# Patient Record
Sex: Female | Born: 1940 | Race: White | Hispanic: No | Smoking: Former smoker
Health system: Southern US, Community
[De-identification: ages and names within clinical notes are randomized; demographics above are authoritative.]

## PROBLEM LIST (undated history)

## (undated) DIAGNOSIS — F32A Depression, unspecified: Secondary | ICD-10-CM

## (undated) DIAGNOSIS — F419 Anxiety disorder, unspecified: Secondary | ICD-10-CM

## (undated) DIAGNOSIS — R112 Nausea with vomiting, unspecified: Secondary | ICD-10-CM

## (undated) DIAGNOSIS — F329 Major depressive disorder, single episode, unspecified: Secondary | ICD-10-CM

## (undated) DIAGNOSIS — K219 Gastro-esophageal reflux disease without esophagitis: Secondary | ICD-10-CM

## (undated) DIAGNOSIS — N189 Chronic kidney disease, unspecified: Secondary | ICD-10-CM

## (undated) DIAGNOSIS — E079 Disorder of thyroid, unspecified: Secondary | ICD-10-CM

## (undated) DIAGNOSIS — I10 Essential (primary) hypertension: Secondary | ICD-10-CM

## (undated) DIAGNOSIS — IMO0001 Reserved for inherently not codable concepts without codable children: Secondary | ICD-10-CM

## (undated) DIAGNOSIS — R252 Cramp and spasm: Secondary | ICD-10-CM

## (undated) DIAGNOSIS — S8010XA Contusion of unspecified lower leg, initial encounter: Secondary | ICD-10-CM

## (undated) DIAGNOSIS — D649 Anemia, unspecified: Secondary | ICD-10-CM

## (undated) DIAGNOSIS — M542 Cervicalgia: Secondary | ICD-10-CM

## (undated) DIAGNOSIS — Z9889 Other specified postprocedural states: Secondary | ICD-10-CM

## (undated) DIAGNOSIS — I82409 Acute embolism and thrombosis of unspecified deep veins of unspecified lower extremity: Secondary | ICD-10-CM

## (undated) DIAGNOSIS — R51 Headache: Secondary | ICD-10-CM

## (undated) DIAGNOSIS — R600 Localized edema: Secondary | ICD-10-CM

## (undated) DIAGNOSIS — M1990 Unspecified osteoarthritis, unspecified site: Secondary | ICD-10-CM

## (undated) HISTORY — DX: Unspecified osteoarthritis, unspecified site: M19.90

## (undated) HISTORY — PX: OTHER SURGICAL HISTORY: SHX169

## (undated) HISTORY — PX: VESICOVAGINAL FISTULA CLOSURE W/ TAH: SUR271

## (undated) HISTORY — DX: Cervicalgia: M54.2

## (undated) HISTORY — DX: Gastro-esophageal reflux disease without esophagitis: K21.9

## (undated) HISTORY — DX: Essential (primary) hypertension: I10

## (undated) HISTORY — PX: TONSILLECTOMY: SUR1361

## (undated) HISTORY — PX: FOOT NEUROMA SURGERY: SHX646

## (undated) HISTORY — PX: TONSILLECTOMY AND ADENOIDECTOMY: SHX28

## (undated) HISTORY — PX: NASAL SINUS SURGERY: SHX719

## (undated) HISTORY — PX: ABDOMINAL HYSTERECTOMY: SHX81

## (undated) HISTORY — DX: Reserved for inherently not codable concepts without codable children: IMO0001

## (undated) HISTORY — DX: Contusion of unspecified lower leg, initial encounter: S80.10XA

## (undated) HISTORY — DX: Acute embolism and thrombosis of unspecified deep veins of unspecified lower extremity: I82.409

## (undated) HISTORY — DX: Localized edema: R60.0

## (undated) HISTORY — DX: Disorder of thyroid, unspecified: E07.9

## (undated) HISTORY — DX: Anxiety disorder, unspecified: F41.9

## (undated) HISTORY — PX: APPENDECTOMY: SHX54

## (undated) HISTORY — DX: Depression, unspecified: F32.A

## (undated) HISTORY — PX: CERVICAL SPINE SURGERY: SHX589

## (undated) HISTORY — DX: Major depressive disorder, single episode, unspecified: F32.9

---

## 1999-04-13 ENCOUNTER — Encounter: Payer: Self-pay | Admitting: Neurosurgery

## 1999-04-15 ENCOUNTER — Encounter: Payer: Self-pay | Admitting: Neurosurgery

## 1999-04-15 ENCOUNTER — Inpatient Hospital Stay (HOSPITAL_COMMUNITY): Admission: RE | Admit: 1999-04-15 | Discharge: 1999-04-17 | Payer: Self-pay | Admitting: Neurosurgery

## 1999-06-08 ENCOUNTER — Encounter: Admission: RE | Admit: 1999-06-08 | Discharge: 1999-06-08 | Payer: Self-pay | Admitting: Neurosurgery

## 1999-06-08 ENCOUNTER — Encounter: Payer: Self-pay | Admitting: Neurosurgery

## 2001-03-22 ENCOUNTER — Encounter: Payer: Self-pay | Admitting: Family Medicine

## 2001-03-22 ENCOUNTER — Ambulatory Visit (HOSPITAL_COMMUNITY): Admission: RE | Admit: 2001-03-22 | Discharge: 2001-03-22 | Payer: Self-pay | Admitting: Family Medicine

## 2001-03-25 ENCOUNTER — Encounter: Payer: Self-pay | Admitting: Family Medicine

## 2001-03-25 ENCOUNTER — Ambulatory Visit (HOSPITAL_COMMUNITY): Admission: RE | Admit: 2001-03-25 | Discharge: 2001-03-25 | Payer: Self-pay | Admitting: *Deleted

## 2001-05-09 ENCOUNTER — Ambulatory Visit (HOSPITAL_COMMUNITY): Admission: RE | Admit: 2001-05-09 | Discharge: 2001-05-09 | Payer: Self-pay | Admitting: Family Medicine

## 2001-05-09 ENCOUNTER — Encounter: Payer: Self-pay | Admitting: Family Medicine

## 2001-06-12 ENCOUNTER — Other Ambulatory Visit: Admission: RE | Admit: 2001-06-12 | Discharge: 2001-06-12 | Payer: Self-pay | Admitting: Family Medicine

## 2001-07-25 ENCOUNTER — Encounter: Payer: Self-pay | Admitting: Family Medicine

## 2001-07-25 ENCOUNTER — Ambulatory Visit (HOSPITAL_COMMUNITY): Admission: RE | Admit: 2001-07-25 | Discharge: 2001-07-25 | Payer: Self-pay | Admitting: Family Medicine

## 2002-04-29 ENCOUNTER — Ambulatory Visit (HOSPITAL_COMMUNITY): Admission: RE | Admit: 2002-04-29 | Discharge: 2002-04-29 | Payer: Self-pay | Admitting: Family Medicine

## 2002-04-29 ENCOUNTER — Encounter: Payer: Self-pay | Admitting: Family Medicine

## 2002-08-06 ENCOUNTER — Encounter: Payer: Self-pay | Admitting: Family Medicine

## 2002-08-06 ENCOUNTER — Ambulatory Visit (HOSPITAL_COMMUNITY): Admission: RE | Admit: 2002-08-06 | Discharge: 2002-08-06 | Payer: Self-pay | Admitting: Family Medicine

## 2002-08-19 ENCOUNTER — Emergency Department (HOSPITAL_COMMUNITY): Admission: EM | Admit: 2002-08-19 | Discharge: 2002-08-19 | Payer: Self-pay | Admitting: Emergency Medicine

## 2002-08-19 ENCOUNTER — Encounter: Payer: Self-pay | Admitting: Emergency Medicine

## 2002-09-30 ENCOUNTER — Emergency Department (HOSPITAL_COMMUNITY): Admission: EM | Admit: 2002-09-30 | Discharge: 2002-09-30 | Payer: Self-pay | Admitting: Emergency Medicine

## 2003-01-18 ENCOUNTER — Emergency Department (HOSPITAL_COMMUNITY): Admission: EM | Admit: 2003-01-18 | Discharge: 2003-01-18 | Payer: Self-pay | Admitting: Emergency Medicine

## 2003-05-27 ENCOUNTER — Ambulatory Visit (HOSPITAL_COMMUNITY): Admission: RE | Admit: 2003-05-27 | Discharge: 2003-05-27 | Payer: Self-pay | Admitting: Family Medicine

## 2003-05-27 ENCOUNTER — Encounter: Payer: Self-pay | Admitting: Family Medicine

## 2003-06-01 ENCOUNTER — Encounter (HOSPITAL_COMMUNITY): Admission: RE | Admit: 2003-06-01 | Discharge: 2003-07-01 | Payer: Self-pay | Admitting: Oncology

## 2003-06-01 ENCOUNTER — Encounter: Admission: RE | Admit: 2003-06-01 | Discharge: 2003-06-01 | Payer: Self-pay | Admitting: Oncology

## 2003-06-16 ENCOUNTER — Ambulatory Visit (HOSPITAL_COMMUNITY): Admission: RE | Admit: 2003-06-16 | Discharge: 2003-06-16 | Payer: Self-pay | Admitting: Internal Medicine

## 2003-06-16 LAB — HM COLONOSCOPY: HM Colonoscopy: NORMAL

## 2003-11-16 ENCOUNTER — Ambulatory Visit (HOSPITAL_COMMUNITY): Admission: RE | Admit: 2003-11-16 | Discharge: 2003-11-16 | Payer: Self-pay | Admitting: Family Medicine

## 2004-02-15 ENCOUNTER — Inpatient Hospital Stay (HOSPITAL_COMMUNITY): Admission: RE | Admit: 2004-02-15 | Discharge: 2004-02-20 | Payer: Self-pay | Admitting: Family Medicine

## 2004-04-22 ENCOUNTER — Ambulatory Visit (HOSPITAL_COMMUNITY): Admission: RE | Admit: 2004-04-22 | Discharge: 2004-04-22 | Payer: Self-pay | Admitting: Family Medicine

## 2004-06-07 ENCOUNTER — Ambulatory Visit (HOSPITAL_COMMUNITY): Admission: RE | Admit: 2004-06-07 | Discharge: 2004-06-07 | Payer: Self-pay | Admitting: Family Medicine

## 2004-06-14 ENCOUNTER — Ambulatory Visit (HOSPITAL_COMMUNITY): Admission: RE | Admit: 2004-06-14 | Discharge: 2004-06-14 | Payer: Self-pay | Admitting: Family Medicine

## 2004-08-15 ENCOUNTER — Ambulatory Visit: Payer: Self-pay | Admitting: Family Medicine

## 2004-08-16 ENCOUNTER — Ambulatory Visit (HOSPITAL_COMMUNITY): Admission: RE | Admit: 2004-08-16 | Discharge: 2004-08-16 | Payer: Self-pay | Admitting: Family Medicine

## 2004-08-17 ENCOUNTER — Ambulatory Visit (HOSPITAL_COMMUNITY): Admission: RE | Admit: 2004-08-17 | Discharge: 2004-08-17 | Payer: Self-pay | Admitting: Family Medicine

## 2004-09-15 ENCOUNTER — Ambulatory Visit: Payer: Self-pay | Admitting: Family Medicine

## 2004-09-16 ENCOUNTER — Encounter (INDEPENDENT_AMBULATORY_CARE_PROVIDER_SITE_OTHER): Payer: Self-pay | Admitting: *Deleted

## 2004-11-07 ENCOUNTER — Ambulatory Visit: Payer: Self-pay | Admitting: Family Medicine

## 2004-12-06 ENCOUNTER — Ambulatory Visit: Payer: Self-pay | Admitting: Family Medicine

## 2005-03-07 ENCOUNTER — Ambulatory Visit: Payer: Self-pay | Admitting: Family Medicine

## 2005-04-18 ENCOUNTER — Ambulatory Visit: Payer: Self-pay | Admitting: Family Medicine

## 2005-06-26 ENCOUNTER — Ambulatory Visit: Payer: Self-pay | Admitting: Family Medicine

## 2005-06-27 ENCOUNTER — Inpatient Hospital Stay: Payer: Self-pay | Admitting: Unknown Physician Specialty

## 2005-09-12 ENCOUNTER — Ambulatory Visit: Payer: Self-pay | Admitting: Family Medicine

## 2005-09-13 ENCOUNTER — Ambulatory Visit (HOSPITAL_COMMUNITY): Admission: RE | Admit: 2005-09-13 | Discharge: 2005-09-13 | Payer: Self-pay | Admitting: Family Medicine

## 2005-09-21 ENCOUNTER — Ambulatory Visit: Payer: Self-pay | Admitting: Family Medicine

## 2005-11-01 ENCOUNTER — Ambulatory Visit: Payer: Self-pay | Admitting: Family Medicine

## 2005-11-09 ENCOUNTER — Ambulatory Visit (HOSPITAL_COMMUNITY): Admission: RE | Admit: 2005-11-09 | Discharge: 2005-11-09 | Payer: Self-pay | Admitting: Family Medicine

## 2005-12-04 ENCOUNTER — Ambulatory Visit (HOSPITAL_COMMUNITY): Admission: RE | Admit: 2005-12-04 | Discharge: 2005-12-04 | Payer: Self-pay | Admitting: Family Medicine

## 2006-01-18 ENCOUNTER — Ambulatory Visit: Payer: Self-pay | Admitting: Family Medicine

## 2006-02-01 ENCOUNTER — Ambulatory Visit: Payer: Self-pay | Admitting: Family Medicine

## 2006-02-13 ENCOUNTER — Ambulatory Visit (HOSPITAL_COMMUNITY): Admission: RE | Admit: 2006-02-13 | Discharge: 2006-02-13 | Payer: Self-pay | Admitting: Family Medicine

## 2006-03-01 ENCOUNTER — Ambulatory Visit: Payer: Self-pay | Admitting: Family Medicine

## 2006-03-26 ENCOUNTER — Ambulatory Visit: Payer: Self-pay | Admitting: Family Medicine

## 2006-05-21 ENCOUNTER — Ambulatory Visit: Payer: Self-pay | Admitting: Family Medicine

## 2006-09-18 ENCOUNTER — Ambulatory Visit: Payer: Self-pay | Admitting: Family Medicine

## 2006-09-24 ENCOUNTER — Ambulatory Visit (HOSPITAL_COMMUNITY): Admission: RE | Admit: 2006-09-24 | Discharge: 2006-09-24 | Payer: Self-pay | Admitting: Family Medicine

## 2006-09-24 ENCOUNTER — Encounter: Payer: Self-pay | Admitting: Family Medicine

## 2006-09-24 LAB — CONVERTED CEMR LAB
ALT: 25 units/L (ref 0–35)
Albumin: 4.2 g/dL (ref 3.5–5.2)
BUN: 9 mg/dL (ref 6–23)
Chloride: 104 meq/L (ref 96–112)
Cholesterol: 181 mg/dL (ref 0–200)
Glucose, Bld: 115 mg/dL — ABNORMAL HIGH (ref 70–99)
LDL Cholesterol: 84 mg/dL (ref 0–99)
Potassium: 4.4 meq/L (ref 3.5–5.3)
Sodium: 140 meq/L (ref 135–145)
TSH: 6.805 microintl units/mL — ABNORMAL HIGH (ref 0.350–5.50)
Total CHOL/HDL Ratio: 5.7
Total Protein: 7.4 g/dL (ref 6.0–8.3)
Triglycerides: 325 mg/dL — ABNORMAL HIGH (ref <150)
Uric Acid, Serum: 4.8 mg/dL (ref 2.4–7.0)
VLDL: 65 mg/dL — ABNORMAL HIGH (ref 0–40)

## 2006-10-03 ENCOUNTER — Ambulatory Visit: Payer: Self-pay | Admitting: Family Medicine

## 2006-10-08 ENCOUNTER — Encounter: Payer: Self-pay | Admitting: Family Medicine

## 2006-10-08 LAB — CONVERTED CEMR LAB: Glucose, Fasting: 98 mg/dL (ref 70–99)

## 2006-10-22 ENCOUNTER — Ambulatory Visit: Payer: Self-pay | Admitting: Family Medicine

## 2006-12-03 ENCOUNTER — Ambulatory Visit: Payer: Self-pay | Admitting: Family Medicine

## 2006-12-17 ENCOUNTER — Ambulatory Visit (HOSPITAL_COMMUNITY): Admission: RE | Admit: 2006-12-17 | Discharge: 2006-12-17 | Payer: Self-pay | Admitting: Family Medicine

## 2006-12-17 ENCOUNTER — Ambulatory Visit: Payer: Self-pay | Admitting: Family Medicine

## 2006-12-17 LAB — CONVERTED CEMR LAB
BUN: 9 mg/dL (ref 6–23)
Calcium: 8.8 mg/dL (ref 8.4–10.5)
Creatinine, Ser: 0.97 mg/dL (ref 0.40–1.20)
Glucose, Bld: 97 mg/dL (ref 70–99)

## 2006-12-19 ENCOUNTER — Ambulatory Visit (HOSPITAL_COMMUNITY): Admission: RE | Admit: 2006-12-19 | Discharge: 2006-12-19 | Payer: Self-pay | Admitting: Family Medicine

## 2006-12-19 ENCOUNTER — Ambulatory Visit: Payer: Self-pay | Admitting: Cardiology

## 2006-12-21 ENCOUNTER — Ambulatory Visit: Payer: Self-pay | Admitting: Family Medicine

## 2006-12-21 ENCOUNTER — Inpatient Hospital Stay (HOSPITAL_COMMUNITY): Admission: AD | Admit: 2006-12-21 | Discharge: 2006-12-25 | Payer: Self-pay

## 2007-01-10 ENCOUNTER — Ambulatory Visit: Payer: Self-pay | Admitting: Family Medicine

## 2007-01-14 ENCOUNTER — Ambulatory Visit (HOSPITAL_COMMUNITY): Admission: RE | Admit: 2007-01-14 | Discharge: 2007-01-14 | Payer: Self-pay | Admitting: Family Medicine

## 2007-02-14 ENCOUNTER — Ambulatory Visit: Payer: Self-pay | Admitting: Family Medicine

## 2007-02-20 ENCOUNTER — Encounter: Payer: Self-pay | Admitting: Family Medicine

## 2007-02-20 LAB — CONVERTED CEMR LAB
ALT: 23 units/L (ref 0–35)
Bilirubin, Direct: 0.1 mg/dL (ref 0.0–0.3)
Chloride: 98 meq/L (ref 96–112)
Cholesterol: 215 mg/dL — ABNORMAL HIGH (ref 0–200)
Eosinophils Absolute: 0.4 10*3/uL (ref 0.0–0.7)
Glucose, Bld: 72 mg/dL (ref 70–99)
LDL Cholesterol: 140 mg/dL — ABNORMAL HIGH (ref 0–99)
Lymphocytes Relative: 37 % (ref 12–46)
Lymphs Abs: 3.2 10*3/uL (ref 0.7–3.3)
MCV: 100.7 fL — ABNORMAL HIGH (ref 78.0–100.0)
Neutro Abs: 4.1 10*3/uL (ref 1.7–7.7)
Neutrophils Relative %: 48 % (ref 43–77)
Platelets: 282 10*3/uL (ref 150–400)
Potassium: 4.6 meq/L (ref 3.5–5.3)
RBC: 4.29 M/uL (ref 3.87–5.11)
Sodium: 136 meq/L (ref 135–145)
TSH: 86.501 microintl units/mL — ABNORMAL HIGH (ref 0.350–5.50)
Total CHOL/HDL Ratio: 4.9
Total Protein: 7.9 g/dL (ref 6.0–8.3)
VLDL: 31 mg/dL (ref 0–40)
WBC: 8.5 10*3/uL (ref 4.0–10.5)

## 2007-03-06 ENCOUNTER — Ambulatory Visit: Payer: Self-pay | Admitting: Cardiovascular Disease

## 2007-03-14 ENCOUNTER — Ambulatory Visit (HOSPITAL_COMMUNITY): Admission: RE | Admit: 2007-03-14 | Discharge: 2007-03-14 | Payer: Self-pay | Admitting: Cardiovascular Disease

## 2007-04-15 ENCOUNTER — Ambulatory Visit: Payer: Self-pay | Admitting: Family Medicine

## 2007-04-15 LAB — CONVERTED CEMR LAB: TSH: 73.228 microintl units/mL — ABNORMAL HIGH (ref 0.350–5.50)

## 2007-05-06 ENCOUNTER — Ambulatory Visit: Payer: Self-pay | Admitting: Family Medicine

## 2007-05-10 ENCOUNTER — Ambulatory Visit: Payer: Self-pay | Admitting: Family Medicine

## 2007-05-10 LAB — CONVERTED CEMR LAB
Calcium: 9.3 mg/dL (ref 8.4–10.5)
Chloride: 100 meq/L (ref 96–112)
Creatinine, Ser: 0.98 mg/dL (ref 0.40–1.20)

## 2007-07-22 ENCOUNTER — Encounter: Payer: Self-pay | Admitting: Orthopedic Surgery

## 2007-07-22 ENCOUNTER — Emergency Department (HOSPITAL_COMMUNITY): Admission: EM | Admit: 2007-07-22 | Discharge: 2007-07-22 | Payer: Self-pay | Admitting: Emergency Medicine

## 2007-07-23 ENCOUNTER — Ambulatory Visit: Payer: Self-pay | Admitting: Orthopedic Surgery

## 2007-07-30 ENCOUNTER — Ambulatory Visit: Payer: Self-pay | Admitting: Family Medicine

## 2007-08-21 ENCOUNTER — Ambulatory Visit: Payer: Self-pay | Admitting: Family Medicine

## 2007-09-12 ENCOUNTER — Ambulatory Visit: Payer: Self-pay | Admitting: Family Medicine

## 2007-09-26 ENCOUNTER — Ambulatory Visit: Payer: Self-pay | Admitting: Family Medicine

## 2007-09-26 ENCOUNTER — Ambulatory Visit: Payer: Self-pay | Admitting: Orthopedic Surgery

## 2007-10-15 ENCOUNTER — Encounter (INDEPENDENT_AMBULATORY_CARE_PROVIDER_SITE_OTHER): Payer: Self-pay | Admitting: *Deleted

## 2007-10-15 DIAGNOSIS — I10 Essential (primary) hypertension: Secondary | ICD-10-CM | POA: Insufficient documentation

## 2007-10-15 DIAGNOSIS — E785 Hyperlipidemia, unspecified: Secondary | ICD-10-CM | POA: Insufficient documentation

## 2007-10-29 ENCOUNTER — Ambulatory Visit: Payer: Self-pay | Admitting: Family Medicine

## 2007-11-26 ENCOUNTER — Ambulatory Visit: Payer: Self-pay | Admitting: Family Medicine

## 2007-12-26 ENCOUNTER — Encounter: Payer: Self-pay | Admitting: Family Medicine

## 2007-12-26 LAB — CONVERTED CEMR LAB
ALT: 26 units/L (ref 0–35)
Alkaline Phosphatase: 77 units/L (ref 39–117)
Cholesterol: 166 mg/dL (ref 0–200)
Indirect Bilirubin: 0.3 mg/dL (ref 0.0–0.9)
Total Protein: 7.5 g/dL (ref 6.0–8.3)
Triglycerides: 411 mg/dL — ABNORMAL HIGH (ref <150)

## 2008-01-22 ENCOUNTER — Ambulatory Visit: Payer: Self-pay | Admitting: Family Medicine

## 2008-03-04 ENCOUNTER — Encounter: Payer: Self-pay | Admitting: Family Medicine

## 2008-03-12 ENCOUNTER — Encounter: Payer: Self-pay | Admitting: Family Medicine

## 2008-03-17 ENCOUNTER — Telehealth: Payer: Self-pay | Admitting: Family Medicine

## 2008-03-18 ENCOUNTER — Encounter: Payer: Self-pay | Admitting: Family Medicine

## 2008-03-25 ENCOUNTER — Encounter: Payer: Self-pay | Admitting: Family Medicine

## 2008-03-26 ENCOUNTER — Ambulatory Visit: Payer: Self-pay | Admitting: Family Medicine

## 2008-03-26 DIAGNOSIS — E119 Type 2 diabetes mellitus without complications: Secondary | ICD-10-CM | POA: Insufficient documentation

## 2008-03-27 ENCOUNTER — Encounter: Payer: Self-pay | Admitting: Family Medicine

## 2008-03-28 ENCOUNTER — Encounter: Payer: Self-pay | Admitting: Family Medicine

## 2008-03-29 DIAGNOSIS — G4700 Insomnia, unspecified: Secondary | ICD-10-CM | POA: Insufficient documentation

## 2008-03-29 DIAGNOSIS — F411 Generalized anxiety disorder: Secondary | ICD-10-CM | POA: Insufficient documentation

## 2008-03-29 DIAGNOSIS — E039 Hypothyroidism, unspecified: Secondary | ICD-10-CM | POA: Insufficient documentation

## 2008-03-31 ENCOUNTER — Encounter: Payer: Self-pay | Admitting: Family Medicine

## 2008-04-01 ENCOUNTER — Encounter (INDEPENDENT_AMBULATORY_CARE_PROVIDER_SITE_OTHER): Payer: Self-pay | Admitting: *Deleted

## 2008-04-01 ENCOUNTER — Telehealth: Payer: Self-pay | Admitting: Family Medicine

## 2008-04-01 ENCOUNTER — Encounter: Payer: Self-pay | Admitting: Family Medicine

## 2008-04-02 ENCOUNTER — Encounter: Payer: Self-pay | Admitting: Family Medicine

## 2008-04-15 ENCOUNTER — Telehealth: Payer: Self-pay | Admitting: Family Medicine

## 2008-04-16 ENCOUNTER — Encounter: Payer: Self-pay | Admitting: Family Medicine

## 2008-04-17 ENCOUNTER — Encounter: Payer: Self-pay | Admitting: Family Medicine

## 2008-05-07 ENCOUNTER — Telehealth: Payer: Self-pay | Admitting: Family Medicine

## 2008-05-14 ENCOUNTER — Telehealth: Payer: Self-pay | Admitting: Family Medicine

## 2008-05-15 ENCOUNTER — Encounter: Payer: Self-pay | Admitting: Family Medicine

## 2008-05-21 ENCOUNTER — Encounter: Payer: Self-pay | Admitting: Family Medicine

## 2008-05-27 ENCOUNTER — Ambulatory Visit: Payer: Self-pay | Admitting: Family Medicine

## 2008-05-27 LAB — CONVERTED CEMR LAB
Glucose, Bld: 148 mg/dL
Hgb A1c MFr Bld: 6.3 %

## 2008-06-02 ENCOUNTER — Telehealth: Payer: Self-pay | Admitting: Family Medicine

## 2008-06-05 ENCOUNTER — Encounter: Payer: Self-pay | Admitting: Family Medicine

## 2008-06-08 ENCOUNTER — Telehealth: Payer: Self-pay | Admitting: Family Medicine

## 2008-06-11 ENCOUNTER — Telehealth: Payer: Self-pay | Admitting: Family Medicine

## 2008-06-12 ENCOUNTER — Encounter: Payer: Self-pay | Admitting: Family Medicine

## 2008-06-16 ENCOUNTER — Encounter: Payer: Self-pay | Admitting: Family Medicine

## 2008-06-16 ENCOUNTER — Telehealth: Payer: Self-pay | Admitting: Family Medicine

## 2008-07-05 ENCOUNTER — Emergency Department (HOSPITAL_COMMUNITY): Admission: EM | Admit: 2008-07-05 | Discharge: 2008-07-05 | Payer: Self-pay | Admitting: Emergency Medicine

## 2008-07-10 ENCOUNTER — Encounter: Payer: Self-pay | Admitting: Family Medicine

## 2008-07-23 ENCOUNTER — Ambulatory Visit: Payer: Self-pay | Admitting: Family Medicine

## 2008-07-23 LAB — CONVERTED CEMR LAB: Glucose, Bld: 137 mg/dL

## 2008-08-19 ENCOUNTER — Telehealth: Payer: Self-pay | Admitting: Family Medicine

## 2008-08-21 ENCOUNTER — Encounter: Payer: Self-pay | Admitting: Family Medicine

## 2008-08-22 ENCOUNTER — Emergency Department (HOSPITAL_COMMUNITY): Admission: EM | Admit: 2008-08-22 | Discharge: 2008-08-23 | Payer: Self-pay | Admitting: Emergency Medicine

## 2008-08-25 ENCOUNTER — Telehealth: Payer: Self-pay | Admitting: Family Medicine

## 2008-08-26 ENCOUNTER — Encounter: Payer: Self-pay | Admitting: Family Medicine

## 2008-08-26 ENCOUNTER — Telehealth: Payer: Self-pay | Admitting: Family Medicine

## 2008-08-27 LAB — CONVERTED CEMR LAB
ALT: 14 units/L (ref 0–35)
BUN: 16 mg/dL (ref 6–23)
Cholesterol: 235 mg/dL — ABNORMAL HIGH (ref 0–200)
Indirect Bilirubin: 0.3 mg/dL (ref 0.0–0.9)
Potassium: 4.5 meq/L (ref 3.5–5.3)
Sodium: 137 meq/L (ref 135–145)
Total CHOL/HDL Ratio: 5.5
Total Protein: 7.2 g/dL (ref 6.0–8.3)
Triglycerides: 312 mg/dL — ABNORMAL HIGH (ref <150)
VLDL: 62 mg/dL — ABNORMAL HIGH (ref 0–40)

## 2008-09-01 ENCOUNTER — Ambulatory Visit: Payer: Self-pay | Admitting: Family Medicine

## 2008-09-01 LAB — CONVERTED CEMR LAB: Glucose, Bld: 115 mg/dL

## 2008-09-02 ENCOUNTER — Encounter: Payer: Self-pay | Admitting: Family Medicine

## 2008-09-07 ENCOUNTER — Telehealth: Payer: Self-pay | Admitting: Family Medicine

## 2008-09-21 ENCOUNTER — Telehealth: Payer: Self-pay | Admitting: Family Medicine

## 2008-09-23 ENCOUNTER — Telehealth: Payer: Self-pay | Admitting: Family Medicine

## 2008-09-30 ENCOUNTER — Telehealth: Payer: Self-pay | Admitting: Family Medicine

## 2008-10-02 ENCOUNTER — Telehealth: Payer: Self-pay | Admitting: Family Medicine

## 2008-10-05 ENCOUNTER — Encounter: Payer: Self-pay | Admitting: Family Medicine

## 2008-10-06 ENCOUNTER — Ambulatory Visit: Payer: Self-pay | Admitting: Family Medicine

## 2008-10-08 ENCOUNTER — Telehealth: Payer: Self-pay | Admitting: Family Medicine

## 2008-10-19 ENCOUNTER — Telehealth: Payer: Self-pay | Admitting: Family Medicine

## 2008-10-29 ENCOUNTER — Telehealth: Payer: Self-pay | Admitting: Family Medicine

## 2008-11-02 ENCOUNTER — Emergency Department (HOSPITAL_COMMUNITY): Admission: EM | Admit: 2008-11-02 | Discharge: 2008-11-02 | Payer: Self-pay | Admitting: Emergency Medicine

## 2008-11-03 ENCOUNTER — Telehealth: Payer: Self-pay | Admitting: Family Medicine

## 2008-11-04 ENCOUNTER — Encounter: Payer: Self-pay | Admitting: Family Medicine

## 2008-11-04 ENCOUNTER — Telehealth: Payer: Self-pay | Admitting: Family Medicine

## 2008-11-10 ENCOUNTER — Encounter: Payer: Self-pay | Admitting: Family Medicine

## 2008-11-19 ENCOUNTER — Telehealth: Payer: Self-pay | Admitting: Family Medicine

## 2008-11-25 ENCOUNTER — Ambulatory Visit (HOSPITAL_COMMUNITY): Admission: RE | Admit: 2008-11-25 | Discharge: 2008-11-25 | Payer: Self-pay | Admitting: Family Medicine

## 2008-11-25 ENCOUNTER — Telehealth: Payer: Self-pay | Admitting: Family Medicine

## 2008-11-26 LAB — CONVERTED CEMR LAB
ALT: 25 units/L (ref 0–35)
AST: 23 units/L (ref 0–37)
Albumin: 3.9 g/dL (ref 3.5–5.2)
Alkaline Phosphatase: 48 units/L (ref 39–117)
BUN: 18 mg/dL (ref 6–23)
Basophils Absolute: 0 10*3/uL (ref 0.0–0.1)
Basophils Relative: 1 % (ref 0–1)
Bilirubin, Direct: 0.1 mg/dL (ref 0.0–0.3)
CO2: 28 meq/L (ref 19–32)
Calcium: 9.5 mg/dL (ref 8.4–10.5)
Chloride: 103 meq/L (ref 96–112)
Cholesterol: 147 mg/dL (ref 0–200)
Creatinine, Ser: 0.92 mg/dL (ref 0.40–1.20)
Eosinophils Absolute: 0.2 10*3/uL (ref 0.0–0.7)
Eosinophils Relative: 5 % (ref 0–5)
Glucose, Bld: 101 mg/dL — ABNORMAL HIGH (ref 70–99)
HCT: 43.3 % (ref 36.0–46.0)
HDL: 30 mg/dL — ABNORMAL LOW (ref >39)
Hemoglobin: 14.1 g/dL (ref 12.0–15.0)
Indirect Bilirubin: 0.3 mg/dL (ref 0.0–0.9)
LDL Cholesterol: 72 mg/dL (ref 0–99)
Lymphocytes Relative: 43 % (ref 12–46)
Lymphs Abs: 2.1 10*3/uL (ref 0.7–4.0)
MCHC: 32.6 g/dL (ref 30.0–36.0)
MCV: 96.4 fL (ref 78.0–100.0)
Monocytes Absolute: 0.5 10*3/uL (ref 0.1–1.0)
Monocytes Relative: 11 % (ref 3–12)
Neutro Abs: 1.9 10*3/uL (ref 1.7–7.7)
Neutrophils Relative %: 40 % — ABNORMAL LOW (ref 43–77)
Platelets: 222 10*3/uL (ref 150–400)
Potassium: 4.3 meq/L (ref 3.5–5.3)
RBC: 4.49 M/uL (ref 3.87–5.11)
RDW: 14.1 % (ref 11.5–15.5)
Sodium: 142 meq/L (ref 135–145)
TSH: 0.194 microintl units/mL — ABNORMAL LOW (ref 0.350–4.500)
Total Bilirubin: 0.4 mg/dL (ref 0.3–1.2)
Total CHOL/HDL Ratio: 4.9
Total Protein: 7.2 g/dL (ref 6.0–8.3)
Triglycerides: 226 mg/dL — ABNORMAL HIGH (ref <150)
VLDL: 45 mg/dL — ABNORMAL HIGH (ref 0–40)
WBC: 4.9 10*3/uL (ref 4.0–10.5)

## 2008-12-01 ENCOUNTER — Encounter: Payer: Self-pay | Admitting: Family Medicine

## 2008-12-30 ENCOUNTER — Ambulatory Visit: Payer: Self-pay | Admitting: Family Medicine

## 2008-12-30 LAB — CONVERTED CEMR LAB: Glucose, Bld: 99 mg/dL

## 2009-01-05 ENCOUNTER — Telehealth: Payer: Self-pay | Admitting: Family Medicine

## 2009-01-07 ENCOUNTER — Telehealth: Payer: Self-pay | Admitting: Family Medicine

## 2009-01-14 ENCOUNTER — Encounter: Payer: Self-pay | Admitting: Family Medicine

## 2009-01-15 ENCOUNTER — Encounter: Payer: Self-pay | Admitting: Family Medicine

## 2009-01-29 ENCOUNTER — Encounter: Payer: Self-pay | Admitting: Family Medicine

## 2009-02-02 ENCOUNTER — Encounter: Payer: Self-pay | Admitting: Family Medicine

## 2009-02-03 ENCOUNTER — Encounter: Payer: Self-pay | Admitting: Family Medicine

## 2009-02-26 ENCOUNTER — Ambulatory Visit: Payer: Self-pay | Admitting: Family Medicine

## 2009-03-04 ENCOUNTER — Encounter: Payer: Self-pay | Admitting: Family Medicine

## 2009-03-10 ENCOUNTER — Encounter: Payer: Self-pay | Admitting: Family Medicine

## 2009-03-12 ENCOUNTER — Encounter: Payer: Self-pay | Admitting: Family Medicine

## 2009-03-17 ENCOUNTER — Telehealth: Payer: Self-pay | Admitting: Family Medicine

## 2009-03-19 ENCOUNTER — Encounter: Payer: Self-pay | Admitting: Family Medicine

## 2009-03-23 ENCOUNTER — Encounter: Payer: Self-pay | Admitting: Family Medicine

## 2009-03-29 ENCOUNTER — Encounter: Payer: Self-pay | Admitting: Family Medicine

## 2009-03-29 ENCOUNTER — Telehealth: Payer: Self-pay | Admitting: Family Medicine

## 2009-04-02 ENCOUNTER — Telehealth: Payer: Self-pay | Admitting: Family Medicine

## 2009-04-05 LAB — CONVERTED CEMR LAB
ALT: 33 units/L (ref 0–35)
Albumin: 4.2 g/dL (ref 3.5–5.2)
Bilirubin, Direct: 0.1 mg/dL (ref 0.0–0.3)
CO2: 25 meq/L (ref 19–32)
Glucose, Bld: 89 mg/dL (ref 70–99)
HDL: 43 mg/dL (ref >39)
Potassium: 5.1 meq/L (ref 3.5–5.3)
Sodium: 137 meq/L (ref 135–145)
TSH: 1.716 microintl units/mL (ref 0.350–4.500)
Total Bilirubin: 0.3 mg/dL (ref 0.3–1.2)
Total CHOL/HDL Ratio: 3.9
VLDL: 48 mg/dL — ABNORMAL HIGH (ref 0–40)

## 2009-04-07 ENCOUNTER — Ambulatory Visit: Payer: Self-pay | Admitting: Family Medicine

## 2009-04-11 DIAGNOSIS — E663 Overweight: Secondary | ICD-10-CM | POA: Insufficient documentation

## 2009-04-11 DIAGNOSIS — F3289 Other specified depressive episodes: Secondary | ICD-10-CM | POA: Insufficient documentation

## 2009-04-11 DIAGNOSIS — E669 Obesity, unspecified: Secondary | ICD-10-CM | POA: Insufficient documentation

## 2009-04-11 DIAGNOSIS — F329 Major depressive disorder, single episode, unspecified: Secondary | ICD-10-CM

## 2009-04-13 ENCOUNTER — Telehealth: Payer: Self-pay | Admitting: Family Medicine

## 2009-04-22 ENCOUNTER — Telehealth: Payer: Self-pay | Admitting: Family Medicine

## 2009-04-28 ENCOUNTER — Telehealth: Payer: Self-pay | Admitting: Family Medicine

## 2009-04-29 ENCOUNTER — Encounter: Payer: Self-pay | Admitting: Family Medicine

## 2009-05-11 ENCOUNTER — Encounter: Payer: Self-pay | Admitting: Family Medicine

## 2009-05-20 ENCOUNTER — Encounter: Payer: Self-pay | Admitting: Family Medicine

## 2009-05-26 ENCOUNTER — Ambulatory Visit: Payer: Self-pay | Admitting: Family Medicine

## 2009-05-26 DIAGNOSIS — R3911 Hesitancy of micturition: Secondary | ICD-10-CM | POA: Insufficient documentation

## 2009-05-26 LAB — CONVERTED CEMR LAB
Creatinine, Ser: 0.93 mg/dL (ref 0.40–1.20)
Hgb A1c MFr Bld: 6.3 %

## 2009-05-27 ENCOUNTER — Encounter: Payer: Self-pay | Admitting: Family Medicine

## 2009-05-27 LAB — CONVERTED CEMR LAB
Creatinine, Urine: 58.7 mg/dL
Microalb Creat Ratio: 261.2 mg/g — ABNORMAL HIGH (ref 0.0–30.0)

## 2009-05-29 DIAGNOSIS — IMO0001 Reserved for inherently not codable concepts without codable children: Secondary | ICD-10-CM | POA: Insufficient documentation

## 2009-06-01 ENCOUNTER — Telehealth: Payer: Self-pay | Admitting: Family Medicine

## 2009-06-07 ENCOUNTER — Telehealth: Payer: Self-pay | Admitting: Family Medicine

## 2009-06-28 ENCOUNTER — Encounter: Payer: Self-pay | Admitting: Family Medicine

## 2009-07-05 ENCOUNTER — Ambulatory Visit: Payer: Self-pay | Admitting: Family Medicine

## 2009-07-07 ENCOUNTER — Inpatient Hospital Stay (HOSPITAL_COMMUNITY): Admission: RE | Admit: 2009-07-07 | Discharge: 2009-07-12 | Payer: Self-pay | Admitting: Family Medicine

## 2009-07-07 ENCOUNTER — Encounter: Payer: Self-pay | Admitting: Family Medicine

## 2009-07-07 ENCOUNTER — Telehealth: Payer: Self-pay | Admitting: Family Medicine

## 2009-07-07 ENCOUNTER — Other Ambulatory Visit: Payer: Self-pay | Admitting: Emergency Medicine

## 2009-07-07 DIAGNOSIS — I82409 Acute embolism and thrombosis of unspecified deep veins of unspecified lower extremity: Secondary | ICD-10-CM

## 2009-07-07 HISTORY — DX: Acute embolism and thrombosis of unspecified deep veins of unspecified lower extremity: I82.409

## 2009-07-07 HISTORY — PX: OTHER SURGICAL HISTORY: SHX169

## 2009-07-13 ENCOUNTER — Encounter: Payer: Self-pay | Admitting: Emergency Medicine

## 2009-07-13 ENCOUNTER — Ambulatory Visit: Payer: Self-pay | Admitting: Orthopedic Surgery

## 2009-07-13 ENCOUNTER — Telehealth: Payer: Self-pay | Admitting: Family Medicine

## 2009-07-13 ENCOUNTER — Inpatient Hospital Stay (HOSPITAL_COMMUNITY): Admission: EM | Admit: 2009-07-13 | Discharge: 2009-07-19 | Payer: Self-pay | Admitting: Internal Medicine

## 2009-07-19 ENCOUNTER — Encounter (INDEPENDENT_AMBULATORY_CARE_PROVIDER_SITE_OTHER): Payer: Self-pay | Admitting: *Deleted

## 2009-07-26 ENCOUNTER — Ambulatory Visit: Payer: Self-pay | Admitting: Family Medicine

## 2009-07-27 ENCOUNTER — Telehealth: Payer: Self-pay | Admitting: Family Medicine

## 2009-07-27 LAB — CONVERTED CEMR LAB
AST: 21 units/L (ref 0–37)
Alkaline Phosphatase: 58 units/L (ref 39–117)
BUN: 28 mg/dL — ABNORMAL HIGH (ref 6–23)
Basophils Relative: 1 % (ref 0–1)
Bilirubin, Direct: 0.1 mg/dL (ref 0.0–0.3)
CO2: 27 meq/L (ref 19–32)
Calcium: 9.6 mg/dL (ref 8.4–10.5)
Chloride: 98 meq/L (ref 96–112)
Creatinine, Ser: 1.31 mg/dL — ABNORMAL HIGH (ref 0.40–1.20)
Eosinophils Absolute: 0.4 10*3/uL (ref 0.0–0.7)
Eosinophils Relative: 4 % (ref 0–5)
HCT: 41.2 % (ref 36.0–46.0)
Indirect Bilirubin: 0.3 mg/dL (ref 0.0–0.9)
LDL Cholesterol: 70 mg/dL (ref 0–99)
Lymphs Abs: 2.9 10*3/uL (ref 0.7–4.0)
MCHC: 32.8 g/dL (ref 30.0–36.0)
MCV: 99 fL (ref 78.0–100.0)
Monocytes Relative: 7 % (ref 3–12)
Neutrophils Relative %: 62 % (ref 43–77)
Platelets: 416 10*3/uL — ABNORMAL HIGH (ref 150–400)
Total Bilirubin: 0.4 mg/dL (ref 0.3–1.2)
WBC: 10.6 10*3/uL — ABNORMAL HIGH (ref 4.0–10.5)

## 2009-07-28 ENCOUNTER — Telehealth: Payer: Self-pay | Admitting: Family Medicine

## 2009-08-03 ENCOUNTER — Encounter: Payer: Self-pay | Admitting: Family Medicine

## 2009-08-04 ENCOUNTER — Telehealth: Payer: Self-pay | Admitting: Family Medicine

## 2009-08-09 ENCOUNTER — Ambulatory Visit: Payer: Self-pay | Admitting: Family Medicine

## 2009-08-11 ENCOUNTER — Encounter: Payer: Self-pay | Admitting: Family Medicine

## 2009-08-19 ENCOUNTER — Encounter: Payer: Self-pay | Admitting: Family Medicine

## 2009-08-26 ENCOUNTER — Ambulatory Visit: Payer: Self-pay | Admitting: Family Medicine

## 2009-08-26 DIAGNOSIS — R52 Pain, unspecified: Secondary | ICD-10-CM | POA: Insufficient documentation

## 2009-08-26 LAB — CONVERTED CEMR LAB: Hgb A1c MFr Bld: 5.9 %

## 2009-08-31 ENCOUNTER — Telehealth: Payer: Self-pay | Admitting: Family Medicine

## 2009-09-01 ENCOUNTER — Encounter: Payer: Self-pay | Admitting: Family Medicine

## 2009-10-04 ENCOUNTER — Encounter: Payer: Self-pay | Admitting: Family Medicine

## 2009-10-11 ENCOUNTER — Telehealth: Payer: Self-pay | Admitting: Family Medicine

## 2009-11-01 ENCOUNTER — Encounter: Payer: Self-pay | Admitting: Family Medicine

## 2009-12-01 ENCOUNTER — Encounter: Payer: Self-pay | Admitting: Family Medicine

## 2009-12-07 ENCOUNTER — Telehealth: Payer: Self-pay | Admitting: Family Medicine

## 2009-12-28 ENCOUNTER — Ambulatory Visit: Payer: Self-pay | Admitting: Family Medicine

## 2010-01-03 ENCOUNTER — Telehealth: Payer: Self-pay | Admitting: Family Medicine

## 2010-01-04 ENCOUNTER — Encounter: Payer: Self-pay | Admitting: Family Medicine

## 2010-01-07 ENCOUNTER — Encounter (INDEPENDENT_AMBULATORY_CARE_PROVIDER_SITE_OTHER): Payer: Self-pay

## 2010-01-10 ENCOUNTER — Telehealth (INDEPENDENT_AMBULATORY_CARE_PROVIDER_SITE_OTHER): Payer: Self-pay | Admitting: *Deleted

## 2010-01-13 LAB — CONVERTED CEMR LAB
CO2: 25 meq/L (ref 19–32)
Calcium: 9 mg/dL (ref 8.4–10.5)
Chloride: 101 meq/L (ref 96–112)
Glucose, Bld: 122 mg/dL — ABNORMAL HIGH (ref 70–99)
Potassium: 4.7 meq/L (ref 3.5–5.3)
Sodium: 138 meq/L (ref 135–145)

## 2010-01-24 ENCOUNTER — Ambulatory Visit (HOSPITAL_COMMUNITY): Admission: RE | Admit: 2010-01-24 | Discharge: 2010-01-24 | Payer: Self-pay | Admitting: Family Medicine

## 2010-01-27 ENCOUNTER — Encounter: Payer: Self-pay | Admitting: Family Medicine

## 2010-02-10 ENCOUNTER — Telehealth: Payer: Self-pay | Admitting: Family Medicine

## 2010-02-14 ENCOUNTER — Telehealth: Payer: Self-pay | Admitting: Family Medicine

## 2010-02-18 ENCOUNTER — Encounter: Admission: RE | Admit: 2010-02-18 | Discharge: 2010-02-18 | Payer: Self-pay | Admitting: Family Medicine

## 2010-02-25 ENCOUNTER — Encounter: Payer: Self-pay | Admitting: Family Medicine

## 2010-02-28 ENCOUNTER — Observation Stay (HOSPITAL_COMMUNITY): Admission: EM | Admit: 2010-02-28 | Discharge: 2010-03-01 | Payer: Self-pay | Admitting: Emergency Medicine

## 2010-03-01 ENCOUNTER — Encounter: Payer: Self-pay | Admitting: Family Medicine

## 2010-03-03 ENCOUNTER — Ambulatory Visit: Payer: Self-pay | Admitting: Family Medicine

## 2010-03-17 ENCOUNTER — Telehealth: Payer: Self-pay | Admitting: Family Medicine

## 2010-03-19 ENCOUNTER — Encounter: Payer: Self-pay | Admitting: Family Medicine

## 2010-03-21 ENCOUNTER — Encounter: Payer: Self-pay | Admitting: Family Medicine

## 2010-03-21 ENCOUNTER — Telehealth: Payer: Self-pay | Admitting: Family Medicine

## 2010-03-25 ENCOUNTER — Encounter: Payer: Self-pay | Admitting: Family Medicine

## 2010-04-12 ENCOUNTER — Telehealth: Payer: Self-pay | Admitting: Family Medicine

## 2010-04-14 ENCOUNTER — Ambulatory Visit: Payer: Self-pay | Admitting: Family Medicine

## 2010-04-15 ENCOUNTER — Telehealth: Payer: Self-pay | Admitting: Family Medicine

## 2010-04-15 LAB — CONVERTED CEMR LAB
CO2: 26 meq/L (ref 19–32)
Chloride: 103 meq/L (ref 96–112)
Glucose, Bld: 66 mg/dL — ABNORMAL LOW (ref 70–99)
Potassium: 5.1 meq/L (ref 3.5–5.3)
Sodium: 139 meq/L (ref 135–145)
TSH: 0.189 microintl units/mL — ABNORMAL LOW (ref 0.350–4.500)

## 2010-04-28 ENCOUNTER — Telehealth: Payer: Self-pay | Admitting: Family Medicine

## 2010-05-11 ENCOUNTER — Telehealth: Payer: Self-pay | Admitting: Family Medicine

## 2010-05-19 ENCOUNTER — Encounter: Payer: Self-pay | Admitting: Family Medicine

## 2010-05-20 ENCOUNTER — Encounter: Payer: Self-pay | Admitting: Family Medicine

## 2010-05-20 ENCOUNTER — Telehealth: Payer: Self-pay | Admitting: Family Medicine

## 2010-06-06 ENCOUNTER — Encounter: Payer: Self-pay | Admitting: Family Medicine

## 2010-06-07 ENCOUNTER — Ambulatory Visit: Payer: Self-pay | Admitting: Family Medicine

## 2010-06-07 DIAGNOSIS — R5383 Other fatigue: Secondary | ICD-10-CM | POA: Insufficient documentation

## 2010-06-07 LAB — HM DIABETES FOOT EXAM

## 2010-06-07 LAB — CONVERTED CEMR LAB: TSH: 1.019 microintl units/mL (ref 0.350–4.500)

## 2010-06-08 ENCOUNTER — Encounter: Payer: Self-pay | Admitting: Family Medicine

## 2010-06-08 LAB — CONVERTED CEMR LAB
Creatinine, Urine: 66 mg/dL
Microalb, Ur: 32.94 mg/dL — ABNORMAL HIGH (ref 0.00–1.89)

## 2010-06-21 ENCOUNTER — Encounter: Payer: Self-pay | Admitting: Family Medicine

## 2010-07-11 ENCOUNTER — Ambulatory Visit (HOSPITAL_COMMUNITY): Admission: RE | Admit: 2010-07-11 | Payer: Self-pay | Admitting: Family Medicine

## 2010-07-13 ENCOUNTER — Telehealth: Payer: Self-pay | Admitting: Family Medicine

## 2010-07-13 DIAGNOSIS — M25569 Pain in unspecified knee: Secondary | ICD-10-CM | POA: Insufficient documentation

## 2010-07-19 ENCOUNTER — Encounter: Payer: Self-pay | Admitting: Family Medicine

## 2010-07-20 ENCOUNTER — Encounter: Payer: Self-pay | Admitting: Family Medicine

## 2010-07-26 ENCOUNTER — Telehealth: Payer: Self-pay | Admitting: Family Medicine

## 2010-08-16 ENCOUNTER — Encounter: Payer: Self-pay | Admitting: Family Medicine

## 2010-08-16 ENCOUNTER — Ambulatory Visit
Admission: RE | Admit: 2010-08-16 | Discharge: 2010-08-16 | Payer: Self-pay | Source: Home / Self Care | Attending: Family Medicine | Admitting: Family Medicine

## 2010-08-17 ENCOUNTER — Encounter: Payer: Self-pay | Admitting: Family Medicine

## 2010-08-17 LAB — CONVERTED CEMR LAB
ALT: 11 U/L (ref 0–35)
AST: 17 U/L (ref 0–37)
Albumin: 4.4 g/dL (ref 3.5–5.2)
Alkaline Phosphatase: 57 U/L (ref 39–117)
BUN: 15 mg/dL (ref 6–23)
Basophils Absolute: 0.1 K/uL (ref 0.0–0.1)
Basophils Relative: 1 % (ref 0–1)
Bilirubin, Direct: 0.1 mg/dL (ref 0.0–0.3)
CO2: 27 meq/L (ref 19–32)
Calcium: 10 mg/dL (ref 8.4–10.5)
Chloride: 101 meq/L (ref 96–112)
Cholesterol: 151 mg/dL (ref 0–200)
Creatinine, Ser: 0.89 mg/dL (ref 0.40–1.20)
Creatinine, Urine: 94.8 mg/dL
Eosinophils Absolute: 0.2 K/uL (ref 0.0–0.7)
Eosinophils Relative: 3 % (ref 0–5)
Glucose, Bld: 99 mg/dL (ref 70–99)
HCT: 46.6 % — ABNORMAL HIGH (ref 36.0–46.0)
HDL: 60 mg/dL (ref >39)
Hemoglobin: 15.2 g/dL — ABNORMAL HIGH (ref 12.0–15.0)
Indirect Bilirubin: 0.3 mg/dL (ref 0.0–0.9)
LDL Cholesterol: 69 mg/dL (ref 0–99)
Lymphocytes Relative: 33 % (ref 12–46)
Lymphs Abs: 2.7 K/uL (ref 0.7–4.0)
MCHC: 32.6 g/dL (ref 30.0–36.0)
MCV: 97.9 fL (ref 78.0–100.0)
Microalb Creat Ratio: 647 mg/g — ABNORMAL HIGH (ref 0.0–30.0)
Microalb, Ur: 61.34 mg/dL — ABNORMAL HIGH (ref 0.00–1.89)
Monocytes Absolute: 0.5 K/uL (ref 0.1–1.0)
Monocytes Relative: 6 % (ref 3–12)
Neutro Abs: 4.9 K/uL (ref 1.7–7.7)
Neutrophils Relative %: 58 % (ref 43–77)
Platelets: 289 K/uL (ref 150–400)
Potassium: 4.5 meq/L (ref 3.5–5.3)
RBC: 4.76 M/uL (ref 3.87–5.11)
RDW: 13 % (ref 11.5–15.5)
Sodium: 139 meq/L (ref 135–145)
TSH: 1.221 u[IU]/mL (ref 0.350–4.500)
Total Bilirubin: 0.4 mg/dL (ref 0.3–1.2)
Total CHOL/HDL Ratio: 2.5
Total Protein: 7.4 g/dL (ref 6.0–8.3)
Triglycerides: 108 mg/dL (ref <150)
VLDL: 22 mg/dL (ref 0–40)
WBC: 8.4 10*3/microliter (ref 4.0–10.5)

## 2010-08-18 ENCOUNTER — Encounter: Payer: Self-pay | Admitting: Family Medicine

## 2010-08-19 ENCOUNTER — Encounter: Payer: Self-pay | Admitting: Family Medicine

## 2010-08-19 LAB — CONVERTED CEMR LAB: Hgb A1c MFr Bld: 5.9 % — ABNORMAL HIGH (ref <5.7)

## 2010-08-28 ENCOUNTER — Encounter: Payer: Self-pay | Admitting: Family Medicine

## 2010-09-07 ENCOUNTER — Ambulatory Visit: Admit: 2010-09-07 | Payer: Self-pay | Admitting: Orthopedic Surgery

## 2010-09-07 ENCOUNTER — Telehealth (INDEPENDENT_AMBULATORY_CARE_PROVIDER_SITE_OTHER): Payer: Self-pay | Admitting: *Deleted

## 2010-09-07 ENCOUNTER — Ambulatory Visit: Payer: Self-pay | Admitting: Orthopedic Surgery

## 2010-09-08 NOTE — Assessment & Plan Note (Signed)
Summary: F UP   Vital Signs:  Patient profile:   70 year old female Menstrual status:  hysterectomy Height:      61.4 inches Weight:      199.50 pounds BMI:     37.34 O2 Sat:      96 % on Room air Pulse rate:   72 / minute Pulse rhythm:   regular Resp:     16 per minute BP sitting:   118 / 70  (left arm)  Vitals Entered By: Baldomero Lamy LPN (September  8, 624THL 3:52 PM)  Nutrition Counseling: Patient's BMI is greater than 25 and therefore counseled on weight management options.  O2 Flow:  Room air CC: follow-up visit Is Patient Diabetic? Yes Pain Assessment Patient in pain? no      Comments did not bring meds to ov   Primary Care Provider:  Tula Nakayama MD  CC:  follow-up visit.  History of Present Illness: Reports  that she has been doing well. She is following a carb restricted diet, with excellent success.  She tests her sugars, AND FASTING SUGARS ARE SELDOM OVER 120. sHE IS BATTLING HER NICOTINE ADDICTION, AND IS HAPPY TO BE WEANEED OFF XANAX. She states she was very upset when she left the office at her last visit , because she got the impression that i did not believe/trust her.  Denies recent fever or chills. Denies sinus pressure, nasal congestion , ear pain or sore throat. Denies chest congestion, or cough productive of sputum. Denies chest pain, palpitations, PND, orthopnea or leg swelling. Denies abdominal pain, nausea, vomitting, diarrhea or constipation. Denies change in bowel movements or bloody stool. Denies dysuria , frequency, incontinence or hesitancy. C/O chronic back pain. Denies headaches, vertigo, seizures. She reports improvement ib her depression,she continues to haveanxiety and insomnia. Denies  rash, lesions, or itch.     Preventive Screening-Counseling & Management  Alcohol-Tobacco     Smoking Cessation Counseling: yes  Allergies (verified): 1)  ! Steroids 2)  ! Tylenol 3)  Coumadin (Warfarin Sodium)  Review of Systems    See HPI General:  Complains of fatigue and sleep disorder. Eyes:  Denies discharge, eye pain, and red eye. MS:  Complains of low back pain and mid back pain. Endo:  Complains of weight change; denies cold intolerance, excessive thirst, and excessive urination; intentional. Heme:  Denies abnormal bruising and bleeding. Allergy:  Complains of seasonal allergies.  Physical Exam  General:  Well-developed,obese,in no acute distress; alert,appropriate and cooperative throughout examination HEENT: No facial asymmetry,  EOMI, No sinus tenderness, TM's Clear, oropharynx  pink and moist.   Chest: Clear to auscultation bilaterally. Decreased air entry throughout CVS: S1, S2, No murmurs, No S3.   Abd: Soft, Nontender.  MS: decreased  ROM spine,a  hips, shoulders and knees.  Ext: No edema.   CNS: CN 2-12 intact, power tone and sensation normal throughout.   Skin: Intact, no visible lesions or rashes.  Psych: Good eye contact, normal affect.  Memory intact, not anxious or depressed appearing.   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 Take 1 tablet by mouth once a day    Methadone Hcl 10 Mg Tabs (Methadone hcl) .Marland Kitchen... Take 1 tablet by mouth three times a day  Problem # 2:  NICOTINE ADDICTION (ICD-305.1) Assessment: Unchanged  Her updated medication list for this problem includes:    Nicotrol 10 Mg Inha (Nicotine) .Marland Kitchen... 16/days 2 weeks, then 12/day x 2 weeks then 8/day x 2 wks then 4/day x  2 weeks  Encouraged smoking cessation and discussed different methods for smoking cessation.   Problem # 3:  DEPRESSION (ICD-311) Assessment: Improved  The following medications were removed from the medication list:    Alprazolam 1 Mg Tabs (Alprazolam) .Marland Kitchen... Take 1 tablet by mouth two times a day , dispense evrey monday, starting March 07, 2010 Her updated medication list for this problem includes:    Fluoxetine Hcl 10 Mg Caps (Fluoxetine hcl) .Marland Kitchen... Take 1 capsule by mouth once a day    Wellbutrin Xl 150 Mg Xr24h-tab (Bupropion hcl) .Marland Kitchen... Take 1 tablet by mouth once a day    Buspirone Hcl 7.5 Mg Tabs (Buspirone hcl) .Marland Kitchen... Take 1 tablet by mouth two times a day , start 03/03/2010    Alprazolam 1 Mg Tabs (Alprazolam) .Marland Kitchen... Take 1 tablet by mouth once a day , to start on monday April 18, 2010  Problem # 4:  OBESITY (ICD-278.00) Assessment: Improved  Ht: 61.4 (04/14/2010)   Wt: 199.50 (04/14/2010)   BMI: 37.34 (04/14/2010) therapeutic lifestyle change discussed and encouraged  Problem # 5:  HYPERTENSION (ICD-401.9) Assessment: Improved  Her updated medication list for this problem includes:    Lasix 40 Mg Tabs (Furosemide) .Marland Kitchen..Marland Kitchen Two tabs by mouth every am and one tab by mouth at bedtime    Lotensin 40 Mg Tabs (Benazepril hcl) ..... One tab by mouth qd  BP today: 118/70 Prior BP: 170/90 (03/03/2010)  Labs Reviewed: K+: 4.7 (12/29/2009) Creat: : 0.83 (12/29/2009)   Chol: 155 (07/26/2009)   HDL: 35 (07/26/2009)   LDL: 70 (07/26/2009)   TG: 251 (07/26/2009)  Problem # 6:  HYPERLIPIDEMIA (ICD-272.4) Assessment: Comment Only  Her updated medication list for this problem includes:    Vytorin 10-20 Mg Tabs (Ezetimibe-simvastatin) .Marland Kitchen... Take one tab at bedtime    Trilipix 135 Mg Cpdr (Choline fenofibrate) ..... One every mon, wed, and fri Low fat dietdiscussed and encouraged   Labs Reviewed: SGOT: 21 (07/26/2009)   SGPT: 16 (07/26/2009)   HDL:35 (07/26/2009), 43 (04/05/2009)   LDL:70 (07/26/2009), 75 (04/05/2009)  Chol:155 (07/26/2009), 166 (04/05/2009)  Trig:251 (07/26/2009), 238 (04/05/2009)  Problem # 7:  DIABETES MELLITUS (ICD-250.00) Assessment: Comment Only  Her updated medication list for this problem includes:    Lotensin 40 Mg Tabs (Benazepril hcl) ..... One tab by mouth qd    Metformin Hcl 500 Mg Tabs (Metformin hcl) .Marland Kitchen... Take 1 tablet by mouth three times a day Patient advised to reduce carbs and sweets, commit to regular physical activity, take meds as prescribed, test blood sugars as directed, and attempt to lose weight , to improve blood sugar control.  Labs Reviewed: Creat: 0.83 (12/29/2009)    Reviewed HgBA1c results: 6.7 (12/29/2009)  5.9 (  08/26/2009)  Complete Medication List: 1)  Miralax Powd (Polyethylene glycol 3350) .... One capful with 8oz. of water once daily 2)  Synthroid 300 Mcg Tabs (Levothyroxine sodium) .... Take 1 tablet by mouth once a day 3)  Lasix 40 Mg Tabs (Furosemide) .... Two tabs by mouth every am and one tab by mouth at bedtime 4)  Nexium 40 Mg Cpdr (Esomeprazole magnesium) .... One cap by mouth qd 5)  Accu-chek Aviva Strp (Glucose blood) .... Once daily testing 6)  Accu-chek Multiclix Lancets Misc (Lancets) .... Once daily testing 7)  Vytorin 10-20 Mg Tabs (Ezetimibe-simvastatin) .... Take one tab at bedtime 8)  Trilipix 135 Mg Cpdr (Choline fenofibrate) .... One every mon, wed, and fri 9)  Nabumetone 500 Mg Tabs (Nabumetone) .... Take 1 tablet by mouth once a day 10)  Methadone Hcl 10 Mg Tabs (Methadone hcl) .... Take 1 tablet by mouth three times a day 11)  Promethazine Hcl 25 Mg Tabs (Promethazine hcl) .... Take 1 tablet by mouth three times a day 12)  Citracal Plus Tabs (Multiple minerals-vitamins) .... One tab by mouth qd 13)  Potassium Chloride Crys Cr 20 Meq Cr-tabs (Potassium chloride crys cr) .... One tab by mouth tid 14)  Neurontin 100 Mg Caps (Gabapentin) .... Take 1 capsule by mouth at bedtime 15)   Glucosamine-chondroitin 250-200 Mg Caps (Glucosamine-chondroitin) .... One tab by mouth bid 16)  Lotensin 40 Mg Tabs (Benazepril hcl) .... One tab by mouth qd 17)  Metanx  .... One tab by mouth qd 18)  Metformin Hcl 500 Mg Tabs (Metformin hcl) .... Take 1 tablet by mouth three times a day 19)  Fluoxetine Hcl 10 Mg Caps (Fluoxetine hcl) .... Take 1 capsule by mouth once a day 20)  Wellbutrin Xl 150 Mg Xr24h-tab (Bupropion hcl) .... Take 1 tablet by mouth once a day 21)  Nicotrol 10 Mg Inha (Nicotine) .Marland Kitchen.. 16/days 2 weeks, then 12/day x 2 weeks then 8/day x 2 wks then 4/day x 2 weeks 22)  Nystop 100000 Unit/gm Powd (Nystatin) .... Apply to affected areas two times a day 23)  Buspirone Hcl 7.5 Mg Tabs (Buspirone hcl) .... Take 1 tablet by mouth two times a day , start 03/03/2010 24)  Alprazolam 1 Mg Tabs (Alprazolam) .... Take 1 tablet by mouth once a day , to start on monday April 18, 2010  Other Orders: Radiology Referral (Radiology) UA Dipstick W/ Micro (manual) ([id number])  Patient Instructions: 1)  Please schedule a follow-up appointment in 2 months. 2)  It is important that you exercise regularly at least 30 minutes 5 times a week. If you develop chest pain, have severe difficulty breathing, or feel very tired , stop exercising immediately and seek medical attention. 3)  You need to lose weight. Consider a lower calorie diet and regular exercise.  4)  Tobacco is very bad for your health and your loved ones! You Should stop smoking!. 5)  Stop Smoking Tips: Choose a Quit date. Cut down before the Quit date. decide what you will do as a substitute when you feel the urge to smoke(gum,toothpick,exercise). 6)  BMP prior to visit, ICD-9: 7)  TSH prior to visit, ICD-9:  today non fasting 8)  HbgA1C prior to visit, ICD-9: 9)  Vitamin D Prescriptions: METHADONE HCL 10 MG TABS (METHADONE HCL) Take 1 tablet by mouth three times a day  #90 x 0   Entered by:   Kate Sable LPN   Authorized by:    Tula Nakayama MD  Signed by:   Tula Nakayama MD on 04/23/2010   Method used:   Handwritten   [id number] ALPRAZOLAM 1 MG TABS (ALPRAZOLAM) Take 1 tablet by mouth once a day , to start on Monday April 18, 2010  #7 x 7   Entered and Authorized by:   Tula Nakayama MD   Signed by:   Tula Nakayama MD on 04/14/2010   Method used:   Printed then faxed to ...       [facility] (retail)       [place] [address]       [place], [place]  [address]       Ph: [phone]       Fax: [phone]   RxID:   [id number]   Laboratory Results

## 2010-09-08 NOTE — Medication Information (Signed)
Summary: Visual merchandiser   Imported By: Dierdre Harness 06/21/2010 07:47:29  _____________________________________________________________________  External Attachment:    Type:   Image     Comment:   External Document

## 2010-09-08 NOTE — Letter (Signed)
Summary: [facility]  [facility]   Imported By: Dierdre Harness 03/28/2010 14:14:45  _____________________________________________________________________  External Attachment:    Type:   Image     Comment:   External Document

## 2010-09-08 NOTE — Medication Information (Signed)
Summary: Visual merchandiser   Imported By: Dierdre Harness 07/20/2010 09:51:13  _____________________________________________________________________  External Attachment:    Type:   Image     Comment:   External Document

## 2010-09-08 NOTE — Assessment & Plan Note (Signed)
Summary: follow up/slj   Vital Signs:  Patient profile:   70 year old female Menstrual status:  hysterectomy Height:      61 inches Weight:      187.25 pounds BMI:     35.51 O2 Sat:      98 % Pulse rate:   84 / minute Pulse rhythm:   regular Resp:     16 per minute BP sitting:   130 / 78  (left arm) Cuff size:   regular  Vitals Entered By: Kate Sable LPN (January 10, [id number] 1:21 PM)  Nutrition Counseling: Patient's BMI is greater than 25 and therefore counseled on weight management options. CC: Follow up chronic problems   Primary Care Provider:  Tula Nakayama MD  CC:  Follow up chronic problems.  History of Present Illness: Reports  that she has been doing extremely well, since changing her diet, stopping smoking, and reduxcing her dependence on xanax. She feels well and has no concerns Denies recent fever or chills. Denies sinus pressure, nasal congestion , ear pain or sore throat. Denies chest congestion, or cough productive of sputum. Denies chest pain, palpitations, PND, orthopnea or leg swelling. Denies abdominal pain, nausea, vomitting, diarrhea or constipation. Denies change in bowel movements or bloody stool. Denies dysuria , frequency, incontinence or hesitancy.  Denies headaches, vertigo, seizures. Denies depression, anxiety or insomnia. Denies  rash, lesions, or itch.     Current Medications (verified): 1)  Miralax   Powd (Polyethylene Glycol 3350) .... One Capful With 8oz. of Water Once Daily 2)  Synthroid 300 Mcg  Tabs (Levothyroxine Sodium) .... Take 1 Tablet By Mouth Once A Day 3)  Nexium 40 Mg Cpdr (Esomeprazole Magnesium) .... One Cap By Mouth Qd 4)  Accu-Chek Aviva  Strp (Glucose Blood) .... Once Daily Testing 5)  Accu-Chek Multiclix Lancets  Misc (Lancets) .... Once Daily Testing 6)  Vytorin 10-40 Mg Tabs (Ezetimibe-Simvastatin) .... Take 1 Tab By Mouth At Bedtime 7)  Nabumetone 500 Mg Tabs (Nabumetone) .... Take 1 Tablet By Mouth Once A Day 8)   Methadone Hcl 10 Mg Tabs (Methadone Hcl) .... Take 1 Tablet By Mouth Three Times A Day 9)  Promethazine Hcl 25 Mg Tabs (Promethazine Hcl) .... Take 1 Tablet By Mouth Three Times A Day 10)  Neurontin 100 Mg Caps (Gabapentin) .... Take 1 Capsule By Mouth At Bedtime 11)  Glucosamine-Chondroitin 250-200 Mg Caps (Glucosamine-Chondroitin) .... One Tab By Mouth Bid 12)  Lotensin 40 Mg Tabs (Benazepril Hcl) .... One Tab By Mouth Qd 13)  Metformin Hcl 500 Mg Tabs (Metformin Hcl) .... Take 1 Tablet By Mouth Three Times A Day 14)  Nystop 100000 Unit/gm Powd (Nystatin) .... Apply To Affected Areas Two Times A Day 15)  Buspirone Hcl 7.5 Mg Tabs (Buspirone Hcl) .... Take 1 Tablet By Mouth Two Times A Day , Start 03/03/2010 16)  Alprazolam 1 Mg Tabs (Alprazolam) .... Take 1 Tablet By Mouth Once A Day 17)  Savella 50 Mg Tabs (Milnacipran Hcl) .... Take 1 Tablet By Mouth Two Times A Day 18)  Trazodone Hcl 50 Mg Tabs (Trazodone Hcl) .... 2 Tabs At Bedtime  Allergies (verified): 1)  ! Steroids 2)  ! Tylenol 3)  Coumadin (Warfarin Sodium)  Review of Systems      See HPI General:  Complains of sleep disorder; reports excellent reulsts with trazadone . Eyes:  Denies discharge and red eye. ENT:  Complains of earache; feels as though her leftear has fluid , more than right,  has recently seen dr Arnette Norris,    . MS:  Complains of joint pain, low back pain, and mid back pain. Endo:  Denies excessive thirst and excessive urination. Heme:  Denies abnormal bruising and bleeding. Allergy:  Complains of seasonal allergies; uses 2 to 3 benadryl daily for the last 3 years.  Physical Exam  General:  Well-developed,well-nourished,in no acute distress; alert,appropriate and cooperative throughout examination HEENT: No facial asymmetry,  EOMI, No sinus tenderness, TM's Clear, oropharynx  pink and moist.   Chest: Clear to auscultation bilaterally.  CVS: S1, S2, No murmurs, No S3.   Abd: Soft, Nontender.  [id number]  ROM  spine, hips, shoulders and knees.  Ext: No edema.   CNS: CN 2-12 intact, power tone and sensation normal throughout.   Skin: Intact, no visible lesions or rashes.  Psych: Good eye contact, normal affect.  Memory intact, not anxious or depressed appearing.    Impression & Recommendations:  Problem # 1:  DEPRESSION (ICD-311) Assessment Improved  Her updated medication list for this problem includes:    Buspirone Hcl 7.5 Mg Tabs (Buspirone hcl) .Marland Kitchen... Take 1 tablet by mouth two times a day , start 03/03/2010    Alprazolam 1 Mg Tabs (Alprazolam) .Marland Kitchen... Take 1 tablet by mouth once a day    Trazodone Hcl 50 Mg Tabs (Trazodone hcl) .Marland Kitchen... 2 tabs at bedtime  Problem # 2:  OBESITY (ICD-278.00) Assessment: Improved  Ht: 61 (08/16/2010)   Wt: 187.25 (08/16/2010)   BMI: 35.51 (08/16/2010) therapeutic lifestyle change discussed and encouraged  Problem # 3:  HYPERTENSION (ICD-401.9) Assessment: Unchanged  Her updated medication list for this problem includes:    Lotensin 40 Mg Tabs (Benazepril hcl) ..... One tab by mouth qd  Orders: Medicare Electronic Prescription [phone])  BP today: 130/78 Prior BP: 130/70 (06/07/2010)  Labs Reviewed: K+: 5.1 (04/14/2010) Creat: : 0.93 (04/14/2010)   Chol: 155 (07/26/2009)   HDL: 35 (07/26/2009)   LDL: 70 (07/26/2009)   TG: 251 (07/26/2009)  Problem # 4:  UNSPECIFIED HYPOTHYROIDISM (ICD-244.9) Assessment: Comment Only  Her updated medication list for this problem includes:    Synthroid 300 Mcg Tabs (Levothyroxine sodium) .Marland Kitchen... Take 1 tablet by mouth once a day  Orders: Medicare Electronic Prescription [id number]) T-TSH [phone])  Labs Reviewed: TSH: 1.019 (06/07/2010)    HgBA1c: 6.1 (04/14/2010) Chol: 155 (07/26/2009)   HDL: 35 (07/26/2009)   LDL: 70 (07/26/2009)   TG: 251 (07/26/2009)  Problem # 5:  DIABETES MELLITUS (ICD-250.00) Assessment: Comment Only  The following medications were removed from the medication list:    Metformin Hcl 500  Mg Tabs (Metformin hcl) .Marland Kitchen... Take 1 tablet by mouth three times a day Her updated medication list for this problem includes:    Lotensin 40 Mg Tabs (Benazepril hcl) ..... One tab by mouth qd    Metformin Hcl 500 Mg Tabs (Metformin hcl) .Marland Kitchen... Take 1 tablet by mouth once a day Patient advised to reduce carbs and sweets, commit to regular physical activity, take meds as prescribed, test blood sugars as directed, and attempt to lose weight , to improve blood sugar control.  Orders: T- Hemoglobin A1C [id number])  Labs Reviewed: Creat: 0.93 (04/14/2010)    Reviewed HgBA1c results: 6.1 (04/14/2010)  6.7 (12/29/2009)  Complete Medication List: 1)  Miralax Powd (Polyethylene glycol 3350) .... One capful with 8oz. of water once daily 2)  Synthroid 300 Mcg Tabs (Levothyroxine sodium) .... Take 1 tablet by mouth once a day 3)  Nexium 40 Mg Cpdr (Esomeprazole magnesium) .Marland KitchenMarland KitchenMarland Kitchen  One cap by mouth qd 4)  Accu-chek Aviva Strp (Glucose blood) .... Once daily testing 5)  Accu-chek Multiclix Lancets Misc (Lancets) .... Once daily testing 6)  Vytorin 10-40 Mg Tabs (Ezetimibe-simvastatin) .... Take 1 tab by mouth at bedtime 7)  Nabumetone 500 Mg Tabs (Nabumetone) .... Take 1 tablet by mouth once a day 8)  Methadone Hcl 10 Mg Tabs (Methadone hcl) .... Take 1 tablet by mouth three times a day 9)  Promethazine Hcl 25 Mg Tabs (Promethazine hcl) .... Take 1 tablet by mouth three times a day 10)  Neurontin 100 Mg Caps (Gabapentin) .... Take 1 capsule by mouth at bedtime 11)  Glucosamine-chondroitin 250-200 Mg Caps (Glucosamine-chondroitin) .... One tab by mouth bid 12)  Lotensin 40 Mg Tabs (Benazepril hcl) .... One tab by mouth qd 13)  Nystop 100000 Unit/gm Powd (Nystatin) .... Apply to affected areas two times a day 14)  Buspirone Hcl 7.5 Mg Tabs (Buspirone hcl) .... Take 1 tablet by mouth two times a day , start 03/03/2010 15)  Alprazolam 1 Mg Tabs (Alprazolam) .... Take 1 tablet by mouth once a day 16)  Savella  50 Mg Tabs (Milnacipran hcl) .... Take 1 tablet by mouth two times a day 17)  Trazodone Hcl 50 Mg Tabs (Trazodone hcl) .... 2 tabs at bedtime 18)  Metformin Hcl 500 Mg Tabs (Metformin hcl) .... Take 1 tablet by mouth once a day  Patient Instructions: 1)  Please schedule a follow-up appointment in 12 weeks. 2)  It is important that you exercise regularly at least 20 minutes 5 times a week. If you develop chest pain, have severe difficulty breathing, or feel very tired , stop exercising immediately and seek medical attention. 3)  You need to lose weight. Consider a lower calorie diet and regular exercise.  4)  HbgA1C prior to visit, ICD-9:   non fastiong in 12 weeks 5)  TSH prior to visit, ICD-9: 6)  Schedule your mammogram. 7)  Congrats on smoking cesstaion, and pls keep up the great improvements you are making. 8)  no med changes at this time Prescriptions: METFORMIN HCL 500 MG TABS (METFORMIN HCL) Take 1 tablet by mouth once a day  #30 x 3   Entered and Authorized by:   Tula Nakayama MD   Signed by:   Tula Nakayama MD on 08/18/2010   Method used:   Historical   [id number] METHADONE HCL 10 MG TABS (METHADONE HCL) Take 1 tablet by mouth three times a day  #90 x 0   Entered by:   Baldomero Lamy LPN   Authorized by:   Tula Nakayama MD   Signed by:   Baldomero Lamy LPN on [id number]   Method used:   Handwritten   [id number] SYNTHROID 300 MCG  TABS (LEVOTHYROXINE SODIUM) Take 1 tablet by mouth once a day  #90 x 0   Entered by:   Baldomero Lamy LPN   Authorized by:   Tula Nakayama MD   Signed by:   Baldomero Lamy LPN on [id number]   Method used:   Electronically to        [facility] (retail)       [place] [address] [place], [place]  [address]       Ph: [phone]       Fax: [phone]   RxID:   [id number] LOTENSIN 40 MG TABS (BENAZEPRIL HCL)  one tab by mouth qd  #90 x 0   Entered by:   Baldomero Lamy LPN    Authorized by:   Tula Nakayama MD   Signed by:   Baldomero Lamy LPN on [id number]   Method used:   Electronically to        [facility] (retail)       [place] [address] [place], [place]  [address]       Ph: [phone]       Fax: [phone]   RxID:   [id number] NEURONTIN 100 MG CAPS (GABAPENTIN) Take 1 capsule by mouth at bedtime  #90 Capsule x 0   Entered by:   Baldomero Lamy LPN   Authorized by:   Tula Nakayama MD   Signed by:   Baldomero Lamy LPN on [id number]   Method used:   Electronically to        [facility] (retail)       [place] [address] [place], [place]  [address]       Ph: [phone]       Fax: [phone]   [id number] NABUMETONE 500 MG TABS (NABUMETONE) Take 1 tablet by mouth once a day  #90 x 0   Entered by:   Baldomero Lamy LPN   Authorized by:   Tula Nakayama MD   Signed by:   Baldomero Lamy LPN on [id number]   Method used:   Electronically to        [facility] (retail)       [place] [address] [place], [place]  [address]       Ph: [phone]       Fax: [phone]   [id number] [place] 40 MG CPDR (ESOMEPRAZOLE MAGNESIUM) one cap by mouth qd  #90 Capsule x 0   Entered by:   Baldomero Lamy LPN   Authorized by:   Tula Nakayama MD   Signed by:   Baldomero Lamy LPN on [id number]   Method used:   Electronically to        [facility] (retail)       [place] [address] [place], [place]  [address]       Ph: [phone]       Fax: [phone]   RxID:   [id number]    Orders Added: 1)  Est. Patient Level IV [id number] 2)  Medicare Electronic Prescription [id number] 3)  T- Hemoglobin A1C [[id number]] 4)  T-TSH [id number]

## 2010-09-08 NOTE — Medication Information (Signed)
Summary: Visual merchandiser   Imported By: Dierdre Harness 08/17/2010 12:52:43  _____________________________________________________________________  External Attachment:    Type:   Image     Comment:   External Document

## 2010-09-08 NOTE — Miscellaneous (Signed)
Summary: NARC REFILL  Clinical Lists Changes  Medications: Rx of METHADONE HCL 10 MG TABS (METHADONE HCL) Take 1 tablet by mouth three times a day;  #90 x 0;  Signed;  Entered by: Kate Sable LPN;  Authorized by: Tula Nakayama MD;  Method used: Handwritten    Prescriptions: METHADONE HCL 10 MG TABS (METHADONE HCL) Take 1 tablet by mouth three times a day  #90 x 0   Entered by:   Kate Sable LPN   Authorized by:   Tula Nakayama MD   Signed by:   Kate Sable LPN on [id number]   Method used:   Handwritten   [id number]

## 2010-09-08 NOTE — Progress Notes (Signed)
Summary: meds   Phone Note Call from Patient   Summary of Call: needs to fax med to pharm before 12. [phone] Initial call taken by: Lenn Cal,  February 10, 2010 11:21 AM  Follow-up for Phone Call        med sent Follow-up by: Baldomero Lamy LPN,  July  7, 624THL [id number] AM

## 2010-09-08 NOTE — Miscellaneous (Signed)
Summary: Orders Update  Clinical Lists Changes  Orders: Added new Test order of T-Basic Metabolic Panel ([id number]) - Signed Added new Test order of T-TSH [phone]) - Signed Added new Test order of T- Hemoglobin A1C [id number]) - Signed Added new Test order of T-Vitamin D (25-Hydroxy) [id number]) - Signed

## 2010-09-08 NOTE — Progress Notes (Signed)
Summary: knot on inside of knee very painful  Phone Note Call from Patient   Summary of Call: wants you to call her back about her knee it is her right knee side of the inside part of the knee it has a knot and very painful Initial call taken by: Dierdre Harness,  July 13, 2010 2:45 PM  Follow-up for Phone Call        needs to see ortho about this , pls let her know and will enter order for eval by orhto asap Follow-up by: Tula Nakayama MD,  July 13, 2010 5:09 PM  Additional Follow-up for Phone Call Additional follow up Details #1::        patient aware Additional Follow-up by: Baldomero Lamy LPN,  December  8, 624THL 11:37 AM  New Problems: KNEE PAIN, ACUTE (ICD-719.46)   New Problems: KNEE PAIN, ACUTE (ICD-719.46)  Appended Document: med change

## 2010-09-08 NOTE — Assessment & Plan Note (Signed)
Summary: office visit   Vital Signs:  Patient profile:   70 year old female Menstrual status:  hysterectomy Height:      61.5 inches Weight:      219.25 pounds BMI:     40.90 O2 Sat:      95 % on Room air Pulse rate:   87 / minute Pulse rhythm:   regular Resp:     16 per minute BP sitting:   140 / 80  (left arm)  Vitals Entered By: Jimmey Ralph LPN (January 20, 624THL 4:46 PM)  Nutrition Counseling: Patient's BMI is greater than 25 and therefore counseled on weight management options.  O2 Flow:  Room air CC: follow-up visit Is Patient Diabetic? Yes Did you bring your meter with you today? No Pain Assessment Patient in pain? no        Primary Care Provider:  Tula Nakayama MD  CC:  follow-up visit.  History of Present Illness: pt has a 3 day h/o progressive facil pressure with green nasal drainge , fever, chills and cough productive of yellow gfreeen sputum. She otherwise has no new complaints and generally believes that she is improving over time. She does have another stated and valid concern which involves safety in the home. She reports repeated falls, has established arthritis of the spine with weakness of both upper and lower ext, she requests an assistive device , a pWC would be most appropriate for her in my opinion.  Preventive Screening-Counseling & Management  Alcohol-Tobacco     Smoking Cessation Counseling: yes  Allergies (verified): 1)  ! Steroids 2)  ! Tylenol 3)  Coumadin (Warfarin Sodium)  Review of Systems      See HPI General:  Complains of chills and fatigue. Eyes:  Denies discharge and red eye. ENT:  Complains of nasal congestion and sinus pressure; left maxillary pressure x 3 days, no fever or chills, yellow green nasal drainage. CV:  Denies chest pain or discomfort and palpitations. Resp:  Complains of cough and sputum productive; 3 day h/o green sputum. GI:  Denies abdominal pain, constipation, diarrhea, nausea, and vomiting. GU:   Denies dysuria and urinary frequency. MS:  Complains of joint pain, low back pain, mid back pain, muscle weakness, and stiffness; pt reports increased falls, she has severe osteoarthritis of the spine and also of the shoulders with upper extremity weakness, she is requesting a pWC to asist with mobility safely in the home which i believe to be appropriate. Derm:  Denies itching and rash. Neuro:  Denies headaches and seizures. Psych:  Complains of anxiety and depression; denies sense of great danger, suicidal thoughts/plans, thoughts of violence, and unusual visions or sounds. Endo:  Denies cold intolerance, excessive hunger, excessive thirst, excessive urination, heat intolerance, polyuria, and weight change. Heme:  Denies abnormal bruising and bleeding. Allergy:  Denies hives or rash and sneezing.  Physical Exam  General:  Well-developed,obese,in no acute distress; alert,appropriate and cooperative throughout examination HEENT: No facial asymmetry,  EOMI, positive left maxillary  sinus tenderness, TM's Clear, oropharynx  pink and moist.   Chest: decreased air entry bilateraqlly, scattered crackles , few wheezes CVS: S1, S2, No murmurs, No S3.   Abd: Soft, Nontender.  MS: decreased  ROM spine, hips, shoulders and knees.  Ext:one plus edema. bilaterally  CNS: CN 2-12 intact,reduced  power and  tone in both upper and lower extremeties, sensation normal throughout.   Skin: Intact, erythema of both legs.Marland Kitchen  Psych: Good eye contact, normal affect.  Memory  intact, not anxious or depressed appearing.    Impression & Recommendations:  Problem # 1:  NICOTINE ADDICTION (ICD-305.1) Assessment Unchanged  Encouraged smoking cessation and discussed different methods for smoking cessation.   Problem # 2:  DEPRESSION (ICD-311) Assessment: Improved  Her updated medication list for this problem includes:    Alprazolam 1 Mg Tabs (Alprazolam) .Marland Kitchen... Take 1 tablet by mouth four times a day fill only on  janury 31, 2010,please    Fluoxetine Hcl 10 Mg Caps (Fluoxetine hcl) .Marland Kitchen... Take 1 capsule by mouth once a day  Problem # 3:  HYPERTENSION (ICD-401.9) Assessment: Unchanged  Her updated medication list for this problem includes:    Lasix 40 Mg Tabs (Furosemide) .Marland Kitchen..Marland Kitchen Two tabs by mouth every am and one tab by mouth at bedtime    Lotensin 40 Mg Tabs (Benazepril hcl) ..... One tab by mouth qd  BP today: 140/80 Prior BP: 108/74 (07/26/2009)  Labs Reviewed: K+: 4.7 (07/26/2009) Creat: : 1.31 (07/26/2009)   Chol: 155 (07/26/2009)   HDL: 35 (07/26/2009)   LDL: 70 (07/26/2009)   TG: 251 (07/26/2009)  Problem # 4:  ACUTE BRONCHITIS (ICD-466.0) Assessment: Comment Only  Her updated medication list for this problem includes:    Doxycycline Hyclate 100 Mg Caps (Doxycycline hyclate) .Marland Kitchen... Take 1 capsule by mouth two times a day    Tessalon Perles 100 Mg Caps (Benzonatate) .Marland Kitchen... Take 1 capsule by mouth three times a day    Keflex 500 Mg Caps (Cephalexin) .Marland Kitchen... Take 1 tablet by mouth two times a day for 7 days  Problem # 5:  DIABETES MELLITUS (ICD-250.00) Assessment: Improved  Her updated medication list for this problem includes:    Lotensin 40 Mg Tabs (Benazepril hcl) ..... One tab by mouth qd    Metformin Hcl 500 Mg Tabs (Metformin hcl) .Marland Kitchen... Take 1 tablet by mouth three times a day  Orders: Glucose, (CBG) (82962) Hemoglobin A1C (83036)  Labs Reviewed: Creat: 1.31 (07/26/2009)    Reviewed HgBA1c results: 5.9 (08/26/2009)  6.3 (05/26/2009)  Problem # 6:  BACK PAIN, CHRONIC (ICD-724.5) Assessment: Unchanged  Her updated medication list for this problem includes:    Nabumetone 500 Mg Tabs (Nabumetone) .Marland Kitchen... Take 1 tablet by mouth once a day    Methadone Hcl 10 Mg Tabs (Methadone hcl) .Marland Kitchen... Take 1 tablet by mouth three times a day  Problem # 7:  ACUTE SINUSITIS, UNSPECIFIED (ICD-461.9) Assessment: Comment Only  Her updated medication list for this problem includes:    Doxycycline  Hyclate 100 Mg Caps (Doxycycline hyclate) .Marland Kitchen... Take 1 capsule by mouth two times a day    Tessalon Perles 100 Mg Caps (Benzonatate) .Marland Kitchen... Take 1 capsule by mouth three times a day    Keflex 500 Mg Caps (Cephalexin) .Marland Kitchen... 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 Take 1 tablet by mouth once a day    Methadone Hcl 10 Mg Tabs (Methadone hcl) .Marland Kitchen... Take 1 tablet by mouth three times a day  Orders: Physical Therapy Referral (PT)  Complete Medication List: 1)  Miralax Powd (Polyethylene glycol 3350) .... One capful with 8oz. of water once daily 2)  Synthroid 300 Mcg Tabs (Levothyroxine sodium) .... One tab by mouth once daily 3)  Lasix 40 Mg Tabs (Furosemide) .... Two tabs by mouth every  am and one tab by mouth at bedtime 4)  Nexium 40 Mg Cpdr (Esomeprazole magnesium) .... One cap by mouth qd 5)  Accu-chek Aviva Strp (Glucose blood) .... Once daily testing 6)  Accu-chek Multiclix Lancets Misc (Lancets) .... Once daily testing 7)  Vytorin 10-20 Mg Tabs (Ezetimibe-simvastatin) .... Take one tab at bedtime 8)  Trilipix 135 Mg Cpdr (Choline fenofibrate) .... One every mon, wed, and fri 9)  Nabumetone 500 Mg Tabs (Nabumetone) .... Take 1 tablet by mouth once a day 10)  Alprazolam 1 Mg Tabs (Alprazolam) .... Take 1 tablet by mouth four times a day fill only on janury 31, 2010,please 11)  Methadone Hcl 10 Mg Tabs (Methadone hcl) .... Take 1 tablet by mouth three times a day 12)  Promethazine Hcl 25 Mg Tabs (Promethazine hcl) .... Take 1 tablet by mouth three times a day 13)  Citracal Plus Tabs (Multiple minerals-vitamins) .... One tab by mouth qd 14)  Potassium Chloride Crys Cr 20 Meq Cr-tabs (Potassium chloride crys cr) .... One tab by mouth tid 15)  Neurontin 100 Mg Caps (Gabapentin) .... Take 1  capsule by mouth at bedtime 16)  Glucosamine-chondroitin 250-200 Mg Caps (Glucosamine-chondroitin) .... One tab by mouth bid 17)  Lotensin 40 Mg Tabs (Benazepril hcl) .... One tab by mouth qd 18)  Metanx  .... One tab by mouth qd 19)  Metformin Hcl 500 Mg Tabs (Metformin hcl) .... Take 1 tablet by mouth three times a day 20)  Fluoxetine Hcl 10 Mg Caps (Fluoxetine hcl) .... Take 1 capsule by mouth once a day 21)  Doxycycline Hyclate 100 Mg Caps (Doxycycline hyclate) .... Take 1 capsule by mouth two times a day 22)  Tessalon Perles 100 Mg Caps (Benzonatate) .... Take 1 capsule by mouth three times a day 23)  Keflex 500 Mg Caps (Cephalexin) .... Take 1 tablet by mouth two times a day for 7 days 24)  Fluconazole 150 Mg Tabs (Fluconazole) .... Take one tab by mouth once daily  Patient Instructions: 1)  Please schedule a follow-up appointment in 3 months. 2)  Tobacco is very bad for your health and your loved ones! You Should stop smoking!. 3)  Stop Smoking Tips: Choose a Quit date. Cut down before the Quit date. decide what you will do as a substitute when you feel the urge to smoke(gum,toothpick,exercise). 4)  You need to lose weight. Consider a lower calorie diet and regular exercise.  5)  You are certainly doing better than you were at the last visit  6)  You  are being treated for acute sinusitis and bronchitis Prescriptions: FLUCONAZOLE 150 MG TABS (FLUCONAZOLE) Take one tab by mouth once daily  #2 x 0   Entered by:   Kate Sable   Authorized by:   Tula Nakayama MD   Signed by:   Kate Sable on 08/31/2009   Method used:   Electronically to        [facility] (retail)       [place] [address] [place], [place]  [address]       Ph: [phone]       Fax: [phone]   [id number] KEFLEX 500 MG CAPS (CEPHALEXIN) Take 1 tablet by mouth two times a day for 7 days  #14 x 0   Entered by:   Kate Sable   Authorized by:   Tula Nakayama  MD  Signed by:   Kate Sable on 08/31/2009   Method used:   Electronically to        [facility] (retail)       [place] [address]       [place], [place]  [address]       Ph: [phone]       Fax: [phone]   RxID:   [id number] METHADONE HCL 10 MG TABS (METHADONE HCL) Take 1 tablet by mouth three times a day  #90 x 0   Entered by:   Kate Sable   Authorized by:   Tula Nakayama MD   Signed by:   Kate Sable on 08/27/2009   Method used:   Handwritten   [id number] TESSALON PERLES 100 MG CAPS (BENZONATATE) Take 1 capsule by mouth three times a day  #30 x 0   Entered and Authorized by:   Tula Nakayama MD   Signed by:   Tula Nakayama MD on 08/26/2009   Method used:   Electronically to        [facility] (retail)       [place] [address]       [place], [place]  [address]       Ph: [phone]       Fax: [phone]   RxID:   [id number] DOXYCYCLINE HYCLATE 100 MG CAPS (DOXYCYCLINE HYCLATE) Take 1 capsule by mouth two times a day  #28 x 0   Entered and Authorized by:   Tula Nakayama MD   Signed by:   Tula Nakayama MD on 08/26/2009   Method used:   Electronically to        [facility] (retail)       [place] [address]       [place], [place]  [address]       Ph: [phone]       Fax: [phone]   RxID:   [id number]   Laboratory Results   Blood Tests   Date/Time Received: August 26, 2009  Date/Time Reported: August 26, 2009   Glucose (random): 142 mg/dL   (Normal Range: 70-105) HGBA1C: 5.9%   (Normal Range: Non-Diabetic - 3-6%   Control Diabetic - 6-8%)

## 2010-09-08 NOTE — Progress Notes (Signed)
Summary: REF  Phone Note Other Incoming   Caller: dr simpson Summary of Call: pls let pt know that that "[facility]" sent rx reqwuest for qa power wheelchair. I only deal with the local supplier [place] as the distant ones have proved extremely problematic.  She needs to get [facility]physical [facility] to see if she qualifeis and if she does rhen she needas to set up an appt specifically for this purpose with me. If she agrees then pls send a request to referralsd, or refer herself yourself (better)  for her evaluation for pWC due to severe arthritis with weakness Initial call taken by: Tula Nakayama MD,  Jan 03, 2010 10:59 AM  Follow-up for Phone Call        called patient, left message Follow-up by: Baldomero Lamy LPN,  May 31, 624THL [id number] AM  Additional Follow-up for Phone Call Additional follow up Details #1::        needs appt to discuss power wheelchair with dr simpson Additional Follow-up by: Baldomero Lamy LPN,  May 31, 624THL 579FGE AM    Additional Follow-up for Phone Call Additional follow up Details #2::    NEEDS APPT FOR PHYSICAL THERAPHY Follow-up by: Dierdre Harness,  Jan 04, 2010 11:22 AM  Additional Follow-up for Phone Call Additional follow up Details #3:: Details for Additional Follow-up Action Taken: pt has been referred to [facility] physical threp[facility].  Additional Follow-up by: Lenn Cal,  January 07, 2010 10:35 AM

## 2010-09-08 NOTE — Progress Notes (Signed)
Summary: lab order  Phone Note Call from Patient   Summary of Call: pt would like to get some lab work please call [phone] Initial call taken by: Lenn Cal,  April 12, 2010 11:17 AM  Follow-up for Phone Call        returned call, no answer, what labs would you like for this patient to have? Follow-up by: Baldomero Lamy LPN,  September  6, 624THL 3:27 PM  Additional Follow-up for Phone Call Additional follow up Details #1::        fasting chem 7, hepatic, egfr, lipids TSH and HBA1C, also should have oV  Additional Follow-up by: Tula Nakayama MD,  April 12, 2010 4:59 PM    Additional Follow-up for Phone Call Additional follow up Details #2::    order sent, patient aware Follow-up by: Baldomero Lamy LPN,  September  7, 624THL 9:46 AM

## 2010-09-08 NOTE — Miscellaneous (Signed)
Summary: NARC REFILL  Clinical Lists Changes  Medications: Rx of METHADONE HCL 10 MG TABS (METHADONE HCL) Take 1 tablet by mouth three times a day;  #90 x 0;  Signed;  Entered by: Kate Sable LPN;  Authorized by: Tula Nakayama MD;  Method used: Handwritten    Prescriptions: METHADONE HCL 10 MG TABS (METHADONE HCL) Take 1 tablet by mouth three times a day  #90 x 0   Entered by:   Kate Sable LPN   Authorized by:   Tula Nakayama MD   Signed by:   Kate Sable LPN on [id number]   Method used:   Handwritten   [id number]

## 2010-09-08 NOTE — Letter (Signed)
Summary: Discharge Summary  Discharge Summary   Imported By: Dierdre Harness 03/03/2010 15:51:39  _____________________________________________________________________  External Attachment:    Type:   Image     Comment:   External Document

## 2010-09-08 NOTE — Miscellaneous (Signed)
Summary: Home Care Report  Home Care Report   Imported By: Dierdre Harness 08/11/2009 13:03:33  _____________________________________________________________________  External Attachment:    Type:   Image     Comment:   External Document

## 2010-09-08 NOTE — Progress Notes (Signed)
Summary: MEDICINE  Phone Note Call from Patient   Summary of Call: CALLED IN HER ALPRAZOLAM TO [facility] AND THEY HAVE NOT RECEIVED IT YET Initial call taken by: Dierdre Harness,  February 10, 2010 11:10 AM  Follow-up for Phone Call        Rx Called In Follow-up by: Baldomero Lamy LPN,  July  7, 624THL 624THL AM    Prescriptions: ALPRAZOLAM 1 MG TABS (ALPRAZOLAM) Take 1 tablet by mouth four times a day  #120 x 0   Entered by:   Baldomero Lamy LPN   Authorized by:   Tula Nakayama MD   Signed by:   Baldomero Lamy LPN on [id number]   Method used:   Printed then faxed to ...       [facility] (retail)       [place] [address]       [place], [place]  [address]       Ph: [phone]       Fax: [phone]   RxID:   [id number]

## 2010-09-08 NOTE — Letter (Signed)
Summary: ent  ent   Imported By: Dierdre Harness 06/06/2010 13:19:34  _____________________________________________________________________  External Attachment:    Type:   Image     Comment:   External Document

## 2010-09-08 NOTE — Progress Notes (Signed)
Summary: RX  Phone Note Call from Patient   Summary of Call: NEEDS HER ALPRAZOLAM  [facility] Initial call taken by: Dierdre Harness,  January 10, 2010 2:06 PM    Prescriptions: ALPRAZOLAM 1 MG TABS (ALPRAZOLAM) Take 1 tablet by mouth four times a day  #120 x 1   Entered by:   Kate Sable LPN   Authorized by:   Tula Nakayama MD   Signed by:   Kate Sable LPN on 624THL   Method used:   Printed then faxed to ...       [facility] (retail)       [place] [address]       [place], [place]  [address]       Ph: [phone]       Fax: [phone]   RxID:   [id number]

## 2010-09-08 NOTE — Miscellaneous (Signed)
Summary: NARC REFILL  Clinical Lists Changes  Medications: Rx of METHADONE HCL 10 MG TABS (METHADONE HCL) Take 1 tablet by mouth three times a day;  #90 x 0;  Signed;  Entered by: Kate Sable LPN;  Authorized by: Tula Nakayama MD;  Method used: Handwritten    Prescriptions: METHADONE HCL 10 MG TABS (METHADONE HCL) Take 1 tablet by mouth three times a day  #90 x 0   Entered by:   Kate Sable LPN   Authorized by:   Tula Nakayama MD   Signed by:   Kate Sable LPN on [id number]   Method used:   Handwritten   [id number]

## 2010-09-08 NOTE — Progress Notes (Signed)
Summary: CAT SCAN  Phone Note Call from Patient   Summary of Call: MISSED HER CAT SCAN DUE TO   BOWL MOVEMENTS NEEDS  YOU TO [facility]   MAKE IT LATE IN THE EVENING CALL BACK WITH IT THE APPT. Initial call taken by: Dierdre Harness,  January 10, 2010 2:04 PM  Follow-up for Phone Call        pt has been rescheduled for 01/24/2010 6:15pm. pt notified  Follow-up by: Lenn Cal,  January 17, 2010 3:36 PM

## 2010-09-08 NOTE — Miscellaneous (Signed)
Summary: Home Care Report  Home Care Report   Imported By: Dierdre Harness 08/19/2009 08:31:21  _____________________________________________________________________  External Attachment:    Type:   Image     Comment:   External Document

## 2010-09-08 NOTE — Progress Notes (Signed)
Summary: ortho appointment scheduled  Phone Note Outgoing Call   Call placed to: Patient Summary of Call: Our office contacted patient re: orthopedic referral and appointment asap per Dr Griffin Dakin request.  Fol'd up with patient today and offered appointment for this week.  Patient states knee is not nearly as painful and said there is no way she can come this week or in early January due to company from out of town.   Appt scheduled with Dr Aline Brochure 08/23/09 at 3:00pm Initial call taken by: Ihor Austin,  July 26, 2010 12:46 PM  Follow-up for Phone Call        noted, thanks Follow-up by: Tula Nakayama MD,  July 26, 2010 1:01 PM

## 2010-09-08 NOTE — Progress Notes (Signed)
Summary: physical therapy  Phone Note Call from Patient   Summary of Call: needs to know if she needs physical therapy or what call her back at [phone] Initial call taken by: Dierdre Harness,  February 14, 2010 11:37 AM  Follow-up for Phone Call        she was referred for pT since last visit because of arthritic pain pls f/u on this and let her know Follow-up by: Tula Nakayama MD,  February 14, 2010 12:29 PM  Additional Follow-up for Phone Call Additional follow up Details #1::        pt was called and notified that she was referred for physical therpy  Additional Follow-up by: Lenn Cal,  February 14, 2010 2:00 PM

## 2010-09-08 NOTE — Progress Notes (Signed)
Summary: question  Phone Note Call from Patient   Summary of Call: pt wants to know what she can take for poison oak. [id number] Initial call taken by: Lenn Cal,  Dec 07, 2009 9:47 AM  Follow-up for Phone Call        topical hydrocortisone cream may help, if not enough, needs ov to establish the dx before pred dose pack since she has not been treated here for that problem before  Follow-up by: Tula Nakayama MD,  Dec 07, 2009 10:03 AM  Additional Follow-up for Phone Call Additional follow up Details #1::        Patient aware Additional Follow-up by: Kate Sable LPN,  May  3, 624THL 579FGE AM

## 2010-09-08 NOTE — Medication Information (Signed)
Summary: [employer]   Imported By: Dierdre Harness 05/20/2010 10:26:38  _____________________________________________________________________  External Attachment:    Type:   Image     Comment:   External Document

## 2010-09-08 NOTE — Assessment & Plan Note (Signed)
Summary: certifcation   Allergies: 1)  ! Steroids 2)  ! Tylenol 3)  Coumadin (Warfarin Sodium)   Complete Medication List: 1)  Miralax Powd (Polyethylene glycol 3350) .... One capful with 8oz. of water once daily 2)  Synthroid 300 Mcg Tabs (Levothyroxine sodium) .... One tab by mouth once daily 3)  Lasix 40 Mg Tabs (Furosemide) .... Two tabs by mouth every am and one tab by mouth at bedtime 4)  Nexium 40 Mg Cpdr (Esomeprazole magnesium) .... One cap by mouth qd 5)  Accu-chek Aviva Strp (Glucose blood) .... Once daily testing 6)  Accu-chek Multiclix Lancets Misc (Lancets) .... Once daily testing 7)  Vytorin 10-20 Mg Tabs (Ezetimibe-simvastatin) .... Take one tab at bedtime 8)  Trilipix 135 Mg Cpdr (Choline fenofibrate) .... One every mon, wed, and fri 9)  Nabumetone 500 Mg Tabs (Nabumetone) .... Take 1 tablet by mouth once a day 10)  Alprazolam 1 Mg Tabs (Alprazolam) .... Take 1 tablet by mouth four times a day fill only on janury 31, 2010,please 11)  Methadone Hcl 10 Mg Tabs (Methadone hcl) .... Take 1 tablet by mouth three times a day 12)  Promethazine Hcl 25 Mg Tabs (Promethazine hcl) .... Take 1 tablet by mouth three times a day 13)  Citracal Plus Tabs (Multiple minerals-vitamins) .... One tab by mouth qd 14)  Potassium Chloride Crys Cr 20 Meq Cr-tabs (Potassium chloride crys cr) .... One tab by mouth tid 15)  Neurontin 100 Mg Caps (Gabapentin) .... Take 1 capsule by mouth at bedtime 16)  Glucosamine-chondroitin 250-200 Mg Caps (Glucosamine-chondroitin) .... One tab by mouth bid 17)  Lotensin 40 Mg Tabs (Benazepril hcl) .... One tab by mouth qd 18)  Metanx  .... One tab by mouth qd 19)  Metformin Hcl 500 Mg Tabs (Metformin hcl) .... Take 1 tablet by mouth three times a day 20)  Fluoxetine Hcl 10 Mg Caps (Fluoxetine hcl) .... Take 1 capsule by mouth once a day  Other Orders: Re-certification of Home Health [phone])

## 2010-09-08 NOTE — Letter (Signed)
Summary: [employer] Consult   Imported By: Ihor Austin 09/16/2009 08:24:21  _____________________________________________________________________  External Attachment:    Type:   Image     Comment:   External Document
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
## 2010-09-08 NOTE — Progress Notes (Signed)
Summary: medication  Phone Note Call from Patient   Summary of Call: patient called she wants to know if she should take her trilpix 3 times a week, but now her bottle states it says take one a day.  does she go with the one a day, this was the last bottle of medication she got through the mail. Initial call taken by: Baldomero Lamy LPN,  September  9, 624THL 11:41 AM  Follow-up for Phone Call        pls find out if she was fasting wwhen she had blood yesterday, if she was add lipid and hepatic pls if niot she needs this the next time she ciomes to collect pain script 32may be in 1 month, and order lipid and hepatic then or add on now. in the meantine continue as she has been doing 3 times per week only Follow-up by: Tula Nakayama MD,  April 15, 2010 11:28 AM  Additional Follow-up for Phone Call Additional follow up Details #1::        wasnt fasting, faxed order to lab Additional Follow-up by: Baldomero Lamy LPN,  September  9, 624THL 11:41 AM

## 2010-09-08 NOTE — Progress Notes (Signed)
  Phone Note Call from Patient   Summary of Call: [facility] [place] about Katelyn Rowland, she was having problems with her leg swelling, red and warm to the touch. [facility] with orders per dr. Moshe Cipro for keflex 500 two times a day for 7 days, fluconazole 150mg  2tabs only and cbc/diff not stat (will be done tomorrow) Initial call taken by: Kate Sable,  August 31, 2009 3:22 PM

## 2010-09-08 NOTE — Miscellaneous (Signed)
Summary: Home Care Report  Home Care Report   Imported By: Dierdre Harness 09/01/2009 08:49:08  _____________________________________________________________________  External Attachment:    Type:   Image     Comment:   External Document

## 2010-09-08 NOTE — Miscellaneous (Signed)
Summary: NARC REFILL  Clinical Lists Changes  Medications: Rx of METHADONE HCL 10 MG TABS (METHADONE HCL) Take 1 tablet by mouth three times a day;  #90 x 0;  Signed;  Entered by: Kate Sable LPN;  Authorized by: Tula Nakayama MD;  Method used: Handwritten    Prescriptions: METHADONE HCL 10 MG TABS (METHADONE HCL) Take 1 tablet by mouth three times a day  #90 x 0   Entered by:   Kate Sable LPN   Authorized by:   Tula Nakayama MD   Signed by:   Kate Sable LPN on [id number]   Method used:   Handwritten   [id number]

## 2010-09-08 NOTE — Letter (Signed)
Summary: dose reduction  dose reduction   Imported By: Dierdre Harness 08/19/2010 11:43:54  _____________________________________________________________________  External Attachment:    Type:   Image     Comment:   External Document

## 2010-09-08 NOTE — Assessment & Plan Note (Signed)
Summary: f up   Vital Signs:  Patient profile:   70 year old female Menstrual status:  hysterectomy Height:      61.4 inches Weight:      189.75 pounds BMI:     35.52 O2 Sat:      96 % on Room air Pulse rate:   66 / minute Pulse rhythm:   regular Resp:     16 per minute BP sitting:   130 / 70  (left arm)  Vitals Entered By: Baldomero Lamy LPN (November  1, 624THL 2:57 PM)  Nutrition Counseling: Patient's BMI is greater than 25 and therefore counseled on weight management options.  O2 Flow:  Room air CC: follow-up visit Is Patient Diabetic? Yes Did you bring your meter with you today? No Pain Assessment Patient in pain? no        Primary Care Provider:  Tula Nakayama MD  CC:  follow-up visit.  History of Present Illness:   . Has sdtopped smoking ,and continues to lose weght with Nutrisystem. Generally she feels well, and is taking much more control over her med depence and addiction to nicotine and food. She looks the est I have seen her in years, and sttes she feels very well.  Denies recent fever or chills. Denies sinus pressure, nasal congestion , ear pain or sore throat. Denies chest congestion, or cough productive of sputum. Denies chest pain, palpitations, PND, orthopnea or leg swelling. Denies abdominal pain, nausea, vomitting, diarrhea or constipation. Denies change in bowel movements or bloody stool. Denies dysuria , frequency, incontinence or hesitancy.  Denies headaches, vertigo, seizures. Denies depressionor uncontrolled  anxiety her  insomnia is unchanged. Denies  rash, lesions, or itch.     Allergies (verified): 1)  ! Steroids 2)  ! Tylenol 3)  Coumadin (Warfarin Sodium)  Family History: Mom 80deceased Dad deceased at age 17 Sister 1 deceased at age 83 emphysema brothers x3  brothers all deceased, one living  Social History: Occupation: Disabled Children:1 living 3 deceased (ca) Widow x2years Nicotine quit in 05/07/2010 Alcohol  use-no Drug use-no  Review of Systems      See HPI General:  Complains of fatigue. Eyes:  Denies blurring and discharge. MS:  Complains of joint pain, low back pain, mid back pain, and stiffness. Neuro:  Complains of headaches; occasional. Psych:  Complains of anxiety, depression, and mental problems; denies panic attacks, sense of great danger, suicidal thoughts/plans, thoughts of violence, and unusual visions or sounds; markedly improved. Endo:  Denies excessive hunger, excessive thirst, excessive urination, and heat intolerance. Heme:  Denies abnormal bruising and bleeding. Allergy:  Denies hives or rash and itching eyes.  Physical Exam  General:  Well-developed,well-nourished,in no acute distress; alert,appropriate and cooperative throughout examination HEENT: No facial asymmetry,  EOMI, No sinus tenderness, TM's Clear, oropharynx  pink and moist.   Chest: Clear to auscultation bilaterally.  CVS: S1, S2, No murmurs, No S3.   Abd: Soft, Nontender.  MS: decreased  ROM spine, hips, shoulders and knees.  Ext: No edema.   CNS: CN 2-12 intact, power tone and sensation normal throughout.   Skin: Intact, no visible lesions or rashes.  Psych: Good eye contact, normal affect.  Memory intact, not anxious or depressed appearing.   Diabetes Management Exam:    Foot Exam (with socks and/or shoes not present):       Sensory-Monofilament:          Left foot: diminished  Right foot: diminished       Inspection:          Left foot: abnormal             Comments: callouses          Right foot: abnormal             Comments: callouses       Nails:          Left foot: thickened          Right foot: thickened   Impression & Recommendations:  Problem # 1:  GEN OSTEOARTHROSIS INVOLVING MULTIPLE SITES (ICD-715.09) Assessment Unchanged  The following medications were removed from the medication list:    Fioricet 50-325-40 Mg Tabs (Butalbital-apap-caffeine) ..... Opne tablet twice  daily as neede for sevsevere headache Her updated medication list for this problem includes:    Nabumetone 500 Mg Tabs (Nabumetone) .Marland Kitchen... Take 1 tablet by mouth once a day    Methadone Hcl 10 Mg Tabs (Methadone hcl) .Marland Kitchen... Take 1 tablet by mouth three times a day  Problem # 2:  DEPRESSION (ICD-311) Assessment: Improved  The following medications were removed from the medication list:    Fluoxetine Hcl 10 Mg Caps (Fluoxetine hcl) .Marland Kitchen... Take 1 capsule by mouth once a day    Wellbutrin Xl 150 Mg Xr24h-tab (Bupropion hcl) .Marland Kitchen... Take 1 tablet by mouth once a day Her updated medication list for this problem includes:    Buspirone Hcl 7.5 Mg Tabs (Buspirone hcl) .Marland Kitchen... Take 1 tablet by mouth two times a day , start 03/03/2010    Alprazolam 1 Mg Tabs (Alprazolam) .Marland Kitchen... Take 1 tablet by mouth once a day , to start on monday April 18, 2010  Problem # 3:  ANXIETY STATE, UNSPECIFIED (ICD-300.00) Assessment: Improved  The following medications were removed from the medication list:    Fluoxetine Hcl 10 Mg Caps (Fluoxetine hcl) .Marland Kitchen... Take 1 capsule by mouth once a day    Wellbutrin Xl 150 Mg Xr24h-tab (Bupropion hcl) .Marland Kitchen... Take 1 tablet by mouth once a day Her updated medication list for this problem includes:    Buspirone Hcl 7.5 Mg Tabs (Buspirone hcl) .Marland Kitchen... Take 1 tablet by mouth two times a day , start 03/03/2010    Alprazolam 1 Mg Tabs (Alprazolam) .Marland Kitchen... Take 1 tablet by mouth once a day , to start on monday April 18, 2010  Orders: Medicare Electronic Prescription [id number])  Problem # 4:  HYPERTENSION (ICD-401.9) Assessment: Unchanged  The following medications were removed from the medication list:    Lasix 40 Mg Tabs (Furosemide) .Marland Kitchen..Marland Kitchen Two tabs by mouth every am and one tab by mouth at bedtime Her updated medication list for this problem includes:    Lotensin 40 Mg Tabs (Benazepril hcl) ..... One tab by mouth qd  Orders: T-Basic Metabolic Panel ([id number])  BP today:  130/70 Prior BP: 118/70 (04/14/2010)  Labs Reviewed: K+: 5.1 (04/14/2010) Creat: : 0.93 (04/14/2010)   Chol: 155 (07/26/2009)   HDL: 35 (07/26/2009)   LDL: 70 (07/26/2009)   TG: 251 (07/26/2009)  Problem # 5:  HYPERLIPIDEMIA (ICD-272.4) Assessment: Comment Only  Her updated medication list for this problem includes:    Vytorin 10-20 Mg Tabs (Ezetimibe-simvastatin) .Marland Kitchen... Take one tab at bedtime    Trilipix 135 Mg Cpdr (Choline fenofibrate) ..... One every mon, wed, and fri  Orders: T-Lipid Profile [id number]) T-Hepatic Function [phone]) Low fat dietdiscussed and encouraged  Labs Reviewed: SGOT: 21 (07/26/2009)   SGPT: 16 (  07/26/2009)   HDL:35 (07/26/2009), 43 (04/05/2009)  LDL:70 (07/26/2009), 75 (04/05/2009)  Chol:155 (07/26/2009), 166 (04/05/2009)  Trig:251 (07/26/2009), 238 (04/05/2009)  Problem # 6:  DIABETES MELLITUS (ICD-250.00) Assessment: Improved  Her updated medication list for this problem includes:    Lotensin 40 Mg Tabs (Benazepril hcl) ..... One tab by mouth qd    Metformin Hcl 500 Mg Tabs (Metformin hcl) .Marland Kitchen... Take 1 tablet by mouth three times a day  Orders: T-Urine Microalbumin w/creat. ratio [id number])  Labs Reviewed: Creat: 0.93 (04/14/2010)    Reviewed HgBA1c results: 6.1 (04/14/2010)  6.7 (12/29/2009)  Complete Medication List: 1)  Miralax Powd (Polyethylene glycol 3350) .... One capful with 8oz. of water once daily 2)  Synthroid 300 Mcg Tabs (Levothyroxine sodium) .... Take 1 tablet by mouth once a day 3)  Nexium 40 Mg Cpdr (Esomeprazole magnesium) .... One cap by mouth qd 4)  Accu-chek Aviva Strp (Glucose blood) .... Once daily testing 5)  Accu-chek Multiclix Lancets Misc (Lancets) .... Once daily testing 6)  Vytorin 10-20 Mg Tabs (Ezetimibe-simvastatin) .... Take one tab at bedtime 7)  Trilipix 135 Mg Cpdr (Choline fenofibrate) .... One every mon, wed, and fri 8)  Nabumetone 500 Mg Tabs (Nabumetone) .... Take 1 tablet by mouth once a  day 9)  Methadone Hcl 10 Mg Tabs (Methadone hcl) .... Take 1 tablet by mouth three times a day 10)  Promethazine Hcl 25 Mg Tabs (Promethazine hcl) .... Take 1 tablet by mouth three times a day 11)  Neurontin 100 Mg Caps (Gabapentin) .... Take 1 capsule by mouth at bedtime 12)  Glucosamine-chondroitin 250-200 Mg Caps (Glucosamine-chondroitin) .... One tab by mouth bid 13)  Lotensin 40 Mg Tabs (Benazepril hcl) .... One tab by mouth qd 14)  Metformin Hcl 500 Mg Tabs (Metformin hcl) .... Take 1 tablet by mouth three times a day 15)  Nystop 100000 Unit/gm Powd (Nystatin) .... Apply to affected areas two times a day 16)  Buspirone Hcl 7.5 Mg Tabs (Buspirone hcl) .... Take 1 tablet by mouth two times a day , start 03/03/2010 17)  Alprazolam 1 Mg Tabs (Alprazolam) .... Take 1 tablet by mouth once a day , to start on monday April 18, 2010  Other Orders: T-TSH [id number]) T-CBC w/Diff [id number]) T-TSH [id number]) Radiology Referral (Radiology)  Patient Instructions: 1)  Please schedule a follow-up appointment in 2 months. 2)  It is important that you exercise regularly at least 20 minutes 5 times a week. If you develop chest pain, have severe difficulty breathing, or feel very tired , stop exercising immediately and seek medical attention. 3)  You need to lose weight. Consider a lower calorie diet and regular exercise. CONGRATS 4)  TSH prior to visit, ICD-9:  today 5)  CBC , fasting lipid, hepatic , chem 7 TSH and TSH in 2 months 6)  CONGRATS  on smoking cessation Prescriptions: ALPRAZOLAM 1 MG TABS (ALPRAZOLAM) Take 1 tablet by mouth once a day , to start on Monday April 18, 2010  #7 x 11   Entered by:   Baldomero Lamy LPN   Authorized by:   Tula Nakayama MD   Signed by:   Baldomero Lamy LPN on 624THL   Method used:   Printed then faxed to ...       [facility] (retail)       [place] [address]       [place], [place]  [address]  Ph:  [phone]       Fax: [phone]   RxID:   [id number]    Orders Added: 1)  Est. Patient Level IV [id number] 2)  Medicare Electronic Prescription [id number])  T-TSH [id number] 4)  T-Urine Microalbumin w/creat. ratio [[id number]] 5)  T-Basic Metabolic Panel 0000000 6)  T-Lipid Profile [[id number]] 7)  T-Hepatic Function [[id number]] 8)  T-CBC w/Diff [id number] 9)  T-TSH [id number] 10)  Radiology Referral [Radiology]

## 2010-09-08 NOTE — Progress Notes (Signed)
Summary: refill question  Phone Note Call from Patient   Summary of Call: has a question about xanax. [id number] Initial call taken by: Lenn Cal,  October 11, 2009 4:02 PM  Follow-up for Phone Call        Phone Call Completed Follow-up by: Baldomero Lamy LPN,  March  7, 624THL 4:31 PM

## 2010-09-08 NOTE — Miscellaneous (Signed)
Summary: NARC REFILL  Clinical Lists Changes  Medications: Rx of METHADONE HCL 10 MG TABS (METHADONE HCL) Take 1 tablet by mouth three times a day;  #90 x 0;  Signed;  Entered by: Kate Sable LPN;  Authorized by: Tula Nakayama MD;  Method used: Handwritten    Prescriptions: METHADONE HCL 10 MG TABS (METHADONE HCL) Take 1 tablet by mouth three times a day  #90 x 0   Entered by:   Kate Sable LPN   Authorized by:   Tula Nakayama MD   Signed by:   Kate Sable LPN on [id number]   Method used:   Handwritten   [id number]

## 2010-09-08 NOTE — Progress Notes (Signed)
Summary: HEADACHE  Phone Note Call from Patient   Summary of Call: HAD A HEADACHE FOR 2 DAYS AND WANTS YOU TO SEND IN SOMETHING TO [facility] CALL PATIENT WHEN DONE TO LET HER KNOW Initial call taken by: Dierdre Harness,  April 28, 2010 10:04 AM  Follow-up for Phone Call        tell her i am sorry about the headache ask and document if she has numbness, blurred visioon ,weakness, if she does need to go to the eD, if not i am sneding in firicet pkls fax if she will need it Follow-up by: Tula Nakayama MD,  April 28, 2010 11:46 AM  Additional Follow-up for Phone Call Additional follow up Details #1::        no numbness, blurred vision, or weakness Additional Follow-up by: Baldomero Lamy LPN,  September 22, 624THL 11:50 AM    New/Updated Medications: FIORICET 50-325-40 MG TABS (BUTALBITAL-APAP-CAFFEINE) opne tablet twice daily as neede for sevsevere headache Prescriptions: FIORICET 50-325-40 MG TABS (BUTALBITAL-APAP-CAFFEINE) opne tablet twice daily as neede for sevsevere headache  #10 x 0   Entered and Authorized by:   Tula Nakayama MD   Signed by:   Tula Nakayama MD on 04/28/2010   Method used:   Printed then faxed to ...       [facility] (retail)       [place] [address]       [place], [place]  [address]       Ph: [phone]       Fax: [phone]   RxID:   [id number]

## 2010-09-08 NOTE — Assessment & Plan Note (Signed)
Summary: office visit   Vital Signs:  Patient profile:   70 year old female Menstrual status:  hysterectomy Height:      61.4 inches Weight:      223.25 pounds BMI:     41.79 O2 Sat:      95 % Pulse rate:   87 / minute Pulse rhythm:   regular Resp:     16 per minute BP sitting:   140 / 80  (left arm) Cuff size:   large  Vitals Entered By: Kate Sable LPN (May 24, 624THL 579FGE PM)  Nutrition Counseling: Patient's BMI is greater than 25 and therefore counseled on weight management options. CC: Follow up chronic problems   Primary Care Provider:  Tula Nakayama MD  CC:  Follow up chronic problems.  History of Present Illness: tormented and depressed about cigarette smoking, wants to quit. She otherwise states that she has been doing well with no specific concerns.  Denies recent fever or chills. Denies sinus pressure, nasal congestion , ear pain or sore throat. Denies chest congestion, or cough productive of sputum. Denies chest pain, palpitations, PND, orthopnea or leg swelling. Denies abdominal pain, nausea, vomitting, diarrhea or constipation. Denies change in bowel movements or bloody stool. Denies dysuria , frequency, incontinence or hesitancy.  Denies headaches, vertigo, seizures. Denies depression, anxiety or insomnia. Denies  rash, lesions, or itch.     Current Medications (verified): 1)  Miralax   Powd (Polyethylene Glycol 3350) .... One Capful With 8oz. of Water Once Daily 2)  Synthroid 300 Mcg  Tabs (Levothyroxine Sodium) .... One Tab By Mouth Once Daily 3)  Lasix 40 Mg  Tabs (Furosemide) .... Two Tabs By Mouth Every Am and One Tab By Mouth At Bedtime 4)  Nexium 40 Mg Cpdr (Esomeprazole Magnesium) .... One Cap By Mouth Qd 5)  Accu-Chek Aviva  Strp (Glucose Blood) .... Once Daily Testing 6)  Accu-Chek Multiclix Lancets  Misc (Lancets) .... Once Daily Testing 7)  Vytorin 10-20 Mg Tabs (Ezetimibe-Simvastatin) .... Take One Tab At Bedtime 8)  Trilipix 135 Mg  Cpdr (Choline Fenofibrate) .... One Every Mon, Wed, and Fri 9)  Nabumetone 500 Mg Tabs (Nabumetone) .... Take 1 Tablet By Mouth Once A Day 10)  Alprazolam 1 Mg Tabs (Alprazolam) .... Take 1 Tablet By Mouth Four Times A Day 11)  Methadone Hcl 10 Mg Tabs (Methadone Hcl) .... Take 1 Tablet By Mouth Three Times A Day 12)  Promethazine Hcl 25 Mg Tabs (Promethazine Hcl) .... Take 1 Tablet By Mouth Three Times A Day 13)  Citracal Plus  Tabs (Multiple Minerals-Vitamins) .... One Tab By Mouth Qd 14)  Potassium Chloride Crys Cr 20 Meq Cr-Tabs (Potassium Chloride Crys Cr) .... One Tab By Mouth Tid 15)  Neurontin 100 Mg Caps (Gabapentin) .... Take 1 Capsule By Mouth At Bedtime 16)  Glucosamine-Chondroitin 250-200 Mg Caps (Glucosamine-Chondroitin) .... One Tab By Mouth Bid 17)  Lotensin 40 Mg Tabs (Benazepril Hcl) .... One Tab By Mouth Qd 18)  Metanx .... One Tab By Mouth Qd 19)  Metformin Hcl 500 Mg Tabs (Metformin Hcl) .... Take 1 Tablet By Mouth Three Times A Day 20)  Fluoxetine Hcl 10 Mg Caps (Fluoxetine Hcl) .... Take 1 Capsule By Mouth Once A Day  Allergies (verified): 1)  ! Steroids 2)  ! Tylenol 3)  Coumadin (Warfarin Sodium)  Review of Systems      See HPI Eyes:  Denies discharge, eye pain, and red eye. MS:  Complains of joint pain, low  back pain, mid back pain, and stiffness. Derm:  Complains of dryness. Neuro:  Denies seizures, sensation of room spinning, and weakness. Psych:  Complains of anxiety and depression; denies sense of great danger, suicidal thoughts/plans, and thoughts of violence. Endo:  Denies cold intolerance, excessive thirst, excessive urination, and heat intolerance. Heme:  Denies abnormal bruising and bleeding. Allergy:  Denies hives or rash and itching eyes.  Physical Exam  General:  Well-developed,obese,in no acute distress; alert,appropriate and cooperative throughout examination HEENT: No facial asymmetry,  EOMI, no  sinus tenderness, TM's Clear, oropharynx  pink  and moist.   Chest: decreased air entry bilateraqlly  CVS: S1, S2, No murmurs, No S3.   Abd: Soft, Nontender.  MS: decreased  ROM spine, hips, shoulders and knees.  Ext:one plus edema. bilaterally  CNS: CN 2-12 intact,reduced  power and  tone in both upper and lower extremeties, sensation normal throughout.   Skin: Intact, erythema of both legs.Marland Kitchen  Psych: Good eye contact, normal affect.  Memory intact, not anxious or depressed appearing.    Impression & Recommendations:  Problem # 1:  NICOTINE ADDICTION (ICD-305.1) Assessment Deteriorated  Encouraged smoking cessation and discussed different methods for smoking cessation.   Problem # 2:  DEPRESSION (ICD-311) Assessment: Deteriorated  Her updated medication list for this problem includes:    Alprazolam 1 Mg Tabs (Alprazolam) .Marland Kitchen... Take 1 tablet by mouth four times a day    Fluoxetine Hcl 10 Mg Caps (Fluoxetine hcl) .Marland Kitchen... Take 1 capsule by mouth once a day    Wellbutrin Xl 150 Mg Xr24h-tab (Bupropion hcl) .Marland Kitchen... 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 Take 1 tablet by mouth once a day    Methadone Hcl 10 Mg Tabs (Methadone hcl) .Marland Kitchen... Take 1 tablet by mouth three times a day  Problem # 4:  OBESITY (ICD-278.00) Assessment: Deteriorated  Ht: 61.4 (12/28/2009)   Wt: 223.25 (12/28/2009)   BMI: 41.79 (12/28/2009)  Problem # 5:  UNSPECIFIED HYPOTHYROIDISM (ICD-244.9) Assessment: Comment Only  Her updated medication list for this problem includes:    Synthroid 300 Mcg Tabs (Levothyroxine sodium) ..... One tab by mouth once daily  Orders: T-TSH [phone])  Labs Reviewed: TSH: 3.384 (07/26/2009)    HgBA1c: 5.9 (08/26/2009) Chol: 155 (07/26/2009)   HDL: 35 (07/26/2009)   LDL: 70 (07/26/2009)   TG: 251 (07/26/2009)  Problem # 6:   HYPERTENSION (ICD-401.9) Assessment: Unchanged  Her updated medication list for this problem includes:    Lasix 40 Mg Tabs (Furosemide) .Marland Kitchen..Marland Kitchen Two tabs by mouth every am and one tab by mouth at bedtime    Lotensin 40 Mg Tabs (Benazepril hcl) ..... One tab by mouth qd  Orders: T-Basic Metabolic Panel (99991111)  BP today: 140/80 Prior BP: 140/80 (08/26/2009)  Labs Reviewed: K+: 4.7 (07/26/2009) Creat: : 1.31 (07/26/2009)   Chol: 155 (07/26/2009)   HDL: 35 (07/26/2009)   LDL: 70 (07/26/2009)   TG: 251 (07/26/2009)  Problem # 7:  DIABETES MELLITUS (ICD-250.00) Assessment: Comment Only  Her updated medication list for this problem includes:    Lotensin 40 Mg Tabs (Benazepril hcl) ..... One tab by mouth qd    Metformin Hcl 500 Mg Tabs (Metformin hcl) .Marland Kitchen... Take 1 tablet by mouth three times a day  Orders: T- Hemoglobin A1C [id number])  Labs Reviewed: Creat: 1.31 (07/26/2009)  Reviewed HgBA1c results: 5.9 (08/26/2009)  6.3 (05/26/2009)  Complete Medication List: 1)  Miralax Powd (Polyethylene glycol 3350) .... One capful with 8oz. of water once daily 2)  Synthroid 300 Mcg Tabs (Levothyroxine sodium) .... One tab by mouth once daily 3)  Lasix 40 Mg Tabs (Furosemide) .... Two tabs by mouth every am and one tab by mouth at bedtime 4)  Nexium 40 Mg Cpdr (Esomeprazole magnesium) .... One cap by mouth qd 5)  Accu-chek Aviva Strp (Glucose blood) .... Once daily testing 6)  Accu-chek Multiclix Lancets Misc (Lancets) .... Once daily testing 7)  Vytorin 10-20 Mg Tabs (Ezetimibe-simvastatin) .... Take one tab at bedtime 8)  Trilipix 135 Mg Cpdr (Choline fenofibrate) .... One every mon, wed, and fri 9)  Nabumetone 500 Mg Tabs (Nabumetone) .... Take 1 tablet by mouth once a day 10)  Alprazolam 1 Mg Tabs (Alprazolam) .... Take 1 tablet by mouth four times a day 11)  Methadone Hcl 10 Mg Tabs (Methadone hcl) .... Take 1 tablet by mouth three times a day 12)  Promethazine Hcl 25 Mg Tabs  (Promethazine hcl) .... Take 1 tablet by mouth three times a day 13)  Citracal Plus Tabs (Multiple minerals-vitamins) .... One tab by mouth qd 14)  Potassium Chloride Crys Cr 20 Meq Cr-tabs (Potassium chloride crys cr) .... One tab by mouth tid 15)  Neurontin 100 Mg Caps (Gabapentin) .... Take 1 capsule by mouth at bedtime 16)  Glucosamine-chondroitin 250-200 Mg Caps (Glucosamine-chondroitin) .... One tab by mouth bid 17)  Lotensin 40 Mg Tabs (Benazepril hcl) .... One tab by mouth qd 18)  Metanx  .... One tab by mouth qd 19)  Metformin Hcl 500 Mg Tabs (Metformin hcl) .... Take 1 tablet by mouth three times a day 20)  Fluoxetine Hcl 10 Mg Caps (Fluoxetine hcl) .... Take 1 capsule by mouth once a day 21)  Wellbutrin Xl 150 Mg Xr24h-tab (Bupropion hcl) .... Take 1 tablet by mouth once a day 22)  Nicotrol 10 Mg Inha (Nicotine) .Marland Kitchen.. 16/days 2 weeks, then 12/day x 2 weeks then 8/day x 2 wks then 4/day x 2 weeks  Other Orders: Radiology Referral (Radiology) Future Orders: Radiology Referral (Radiology) ... 12/30/2009  Patient Instructions: 1)  f/u in 6 weeks. 2)  i know that you areable to quit smoking and encourage you to take the meds as prescribed , and to continue in [place] and prayer. 3)  pls do not bring anymore ciggs in your home, and use the nicotrol inhalers as prescribed 4)  BMP prior to visit, ICD-9: 5)  TSH prior to visit, ICD-9: today , non fasting 6)  HbgA1C prior to visit, ICD-9: 7)  You will be refered fro scans of your abdomen and pelis to eval swelling and pain. Prescriptions: NICOTROL 10 MG INHA (NICOTINE) 16/days 2 weeks, then 12/day x 2 weeks then 8/day x 2 wks then 4/day x 2 weeks  #1 x 0   Entered and Authorized by:   Tula Nakayama MD   Signed by:   Tula Nakayama MD on 01/03/2010   Method used:   Handwritten   [id number] METHADONE HCL 10 MG TABS (METHADONE HCL) Take 1 tablet by mouth three times a day  #90 x 0   Entered by:   Kate Sable LPN    Authorized by:   Tula Nakayama MD   Signed by:   Kate Sable LPN on [id number]   Method used:   Handwritten   RxID:  [id number] WELLBUTRIN XL 150 MG XR24H-TAB (BUPROPION HCL) Take 1 tablet by mouth once a day  #30 x 3   Entered and Authorized by:   Tula Nakayama MD   Signed by:   Tula Nakayama MD on 12/28/2009   Method used:   Electronically to        [facility] (retail)       [place] [address]       [place], [place]  [address]       Ph: [phone]       Fax: [phone]   RxID:   [id number]

## 2010-09-08 NOTE — Progress Notes (Signed)
  Phone Note Call from Patient   Complaint: Breathing Problems Summary of Call: [facility] called and said that the rx they got for her synthroid 300 was over the max daily dose. You had her taking one and a half tabs fri sat and sun and they said that the max was 300 micrograms a day. They said she has not been getting her meds on a regular basis and they are certain she is non compliant. I spoke to patient and she said that was taking so many meds at once that she may have not been taking it as she should have. But now she has a pill organizer and she's doing alot better with it. Do you want to go back to the 340mcg daily or do they need to dispense what you sent in? They need to hear back about the corrected dose asap. [phone] Ref # [id number] Initial call taken by: Kate Sable LPN,  August 11, 624THL 1:40 PM  Follow-up for Phone Call        put her back on the dose of 351mcg one daily, explain to her it is vital she take meds as prescibed also and fax in the dose of 327mcg one daily x 90 day supply Follow-up by: Tula Nakayama MD,  March 18, 2010 8:14 AM  Additional Follow-up for Phone Call Additional follow up Details #1::        [facility] aware of the dose change and will ship. Called [facility], no answer Additional Follow-up by: Kate Sable LPN,  August 12, 624THL 2:44 PM    Additional Follow-up for Phone Call Additional follow up Details #2::    called patient no answer Follow-up by: Kate Sable LPN,  August 12, 624THL 3:30 PM  Additional Follow-up for Phone Call Additional follow up Details #3:: Details for Additional Follow-up Action Taken: Patient aware Additional Follow-up by: Kate Sable LPN,  August 16, 624THL 2:47 PM  New/Updated Medications: SYNTHROID 300 MCG  TABS (LEVOTHYROXINE SODIUM) Take 1 tablet by mouth once a day Prescriptions: SYNTHROID 300 MCG  TABS (LEVOTHYROXINE SODIUM) Take 1 tablet by mouth once a day  #90 x 0   Entered by:   Kate Sable LPN   Authorized by:    Tula Nakayama MD   Signed by:   Kate Sable LPN on [id number]   Method used:   Telephoned to ...       [facility] (retail)       [place] [address]       [place], [place]  [address]       Ph: [phone]       Fax: [phone]   RxID:   [id number]

## 2010-09-08 NOTE — Letter (Signed)
Summary: lab add on  lab add on   Imported By: Luann Bullins 08/18/2010 08:13:28  _____________________________________________________________________  External Attachment:    Type:   Image     Comment:   External Document

## 2010-09-08 NOTE — Progress Notes (Signed)
Summary: MEDICINE  Phone Note Call from Patient   Summary of Call: NEEDS FOR YOU TO CALL IN HER METFORMIN CALLED INTO [place] [facility] FOR 2 WKS. UNTIL SHE CAN HER ORDER AT [facility] Initial call taken by: Dierdre Harness,  March 21, 2010 9:29 AM  Follow-up for Phone Call        ok pls do Follow-up by: Tula Nakayama MD,  March 21, 2010 11:56 AM    Prescriptions: METFORMIN HCL 500 MG TABS (METFORMIN HCL) Take 1 tablet by mouth three times a day  #45 x 0   Entered by:   Baldomero Lamy LPN   Authorized by:   Tula Nakayama MD   Signed by:   Baldomero Lamy LPN on [id number]   Method used:   Electronically to        [facility] (retail)       [place] [address] [place], [place]  [address]       Ph: [phone]       Fax: [phone]   RxID:   [id number]

## 2010-09-08 NOTE — Progress Notes (Signed)
Summary: meds  Phone Note Call from Patient   Summary of Call: pt has quit smoking.  wants to know why doc denied headache medicine. she really needs it [phone] Initial call taken by: Lenn Cal,  May 11, 2010 11:21 AM  Follow-up for Phone Call        it wasn't denied. refaxed in  Follow-up by: Kate Sable LPN,  October  5, 624THL 11:54 AM    Prescriptions: FIORICET 50-325-40 MG TABS (BUTALBITAL-APAP-CAFFEINE) opne tablet twice daily as neede for sevsevere headache  #10 x 0   Entered by:   Kate Sable LPN   Authorized by:   Tula Nakayama MD   Signed by:   Kate Sable LPN on [id number]   Method used:   Electronically to        [facility] (retail)       [place] [address] [place], [place]  [address]       Ph: [phone]       Fax: [phone]   RxID:   [id number]

## 2010-09-08 NOTE — Progress Notes (Signed)
Summary: dr. Merlene Laughter  dr. Merlene Laughter   Imported By: Dierdre Harness 01/12/2010 09:42:45  _____________________________________________________________________  External Attachment:    Type:   Image     Comment:   External Document

## 2010-09-08 NOTE — Medication Information (Signed)
Summary: Visual merchandiser   Imported By: Dierdre Harness 07/19/2010 13:53:22  _____________________________________________________________________  External Attachment:    Type:   Image     Comment:   External Document

## 2010-09-08 NOTE — Progress Notes (Signed)
Summary: get medicine  Phone Note Call from Patient   Summary of Call: pt would like to get a rx delievered today. [id number] Initial call taken by: Lenn Cal,  May 20, 2010 10:45 AM  Follow-up for Phone Call        Gets her 7 day Xanax rx on Monday's  She has a doc app Monday and she will be there for awhile getting tests and has to get her pastor to provide the transportation and she does not know how long she will be gone. Wants to know if [facility] can deliver her Xanax today. She won't take them until Monday.  Follow-up by: Kate Sable LPN,  October 14, 624THL 11:21 AM  Additional Follow-up for Phone Call Additional follow up Details #1::        pls lether know we will request that they deliver the xanax on Sunday, and ask them to do thispls, I will write pls fax request over Additional Follow-up by: Tula Nakayama MD,  May 20, 2010 12:40 PM    Additional Follow-up for Phone Call Additional follow up Details #2::    They do not deliver on the weekend. That was why she was wanting it done today.  Follow-up by: Kate Sable LPN,  October 14, 624THL 1:34 PM  Additional Follow-up for Phone Call Additional follow up Details #3:: Details for Additional Follow-up Action Taken: pls find out from [facility] the earliest they can get it to her on monday or the latest pls, shewill need to get it on monday, let her know, i am a bit cautious, i think she may be ovetaking again. Alternatively she herself can pick it up on monday on the way to or from her appt Additional Follow-up by: Tula Nakayama MD,  May 20, 2010 3:16 PM   Called patient no answer Patient aware

## 2010-09-08 NOTE — Progress Notes (Signed)
Summary: WHEELCHAIR ASSESSMENT  WHEELCHAIR ASSESSMENT   Imported By: Dierdre Harness 03/21/2010 14:49:10  _____________________________________________________________________  External Attachment:    Type:   Image     Comment:   External Document

## 2010-09-08 NOTE — Letter (Signed)
Summary: Letter / APPT  Letter / APPT   Imported By: Dierdre Harness 12/01/2009 08:58:02  _____________________________________________________________________  External Attachment:    Type:   Image     Comment:   External Document

## 2010-09-08 NOTE — Medication Information (Signed)
Summary: Visual merchandiser   Imported By: Dierdre Harness 03/21/2010 13:44:31  _____________________________________________________________________  External Attachment:    Type:   Image     Comment:   External Document

## 2010-09-08 NOTE — Miscellaneous (Signed)
Summary: NARC REFILL  Clinical Lists Changes  Medications: Rx of METHADONE HCL 10 MG TABS (METHADONE HCL) Take 1 tablet by mouth three times a day;  #90 x 0;  Signed;  Entered by: Kate Sable LPN;  Authorized by: Tula Nakayama MD;  Method used: Handwritten    Prescriptions: METHADONE HCL 10 MG TABS (METHADONE HCL) Take 1 tablet by mouth three times a day  #90 x 0   Entered by:   Kate Sable LPN   Authorized by:   Tula Nakayama MD   Signed by:   Kate Sable LPN on [id number]   Method used:   Handwritten   [id number]

## 2010-09-08 NOTE — Miscellaneous (Signed)
Summary: Home Care Report  Home Care Report   Imported By: Dierdre Harness 08/09/2009 08:34:14  _____________________________________________________________________  External Attachment:    Type:   Image     Comment:   External Document

## 2010-09-08 NOTE — Miscellaneous (Signed)
Summary: NARC REFILL  Clinical Lists Changes  Medications: Rx of METHADONE HCL 10 MG TABS (METHADONE HCL) Take 1 tablet by mouth three times a day;  #90 x 0;  Signed;  Entered by: Kate Sable LPN;  Authorized by: Tula Nakayama MD;  Method used: Handwritten    Prescriptions: METHADONE HCL 10 MG TABS (METHADONE HCL) Take 1 tablet by mouth three times a day  #90 x 0   Entered by:   Kate Sable LPN   Authorized by:   Tula Nakayama MD   Signed by:   Kate Sable LPN on [id number]   Method used:   Handwritten   [id number]

## 2010-09-14 NOTE — Progress Notes (Signed)
Summary: ortho referral appointment information  Phone Note Call from Patient   Caller: Patient Summary of Call: RE: Ortho Referral appointment with Dr Aline Brochure:   On 1219 and 07/26/10, we spoke with patient and scheduled her for appointment with Dr Aline Brochure for her painful knee. We offered appt for 07/28/10, and she stated she could not come until mid-January due to having company for holidays.  She therefore scheduled for 08/23/10.  On this date, she cancelled due to transportation, and re-scheduled for 09/07/10.   On 09/07/10, she called to re-schedule the appointment, due to a time "mix-up" with transit. We offered a different time, but patient said it would be too late notice.  She is re-scheduled for another Feb appt date.   Re-scheduled for another February date. Initial call taken by: Ihor Austin,  September 07, 2010 12:07 PM

## 2010-09-15 ENCOUNTER — Encounter: Payer: Self-pay | Admitting: Family Medicine

## 2010-09-22 NOTE — Medication Information (Signed)
Summary: Visual merchandiser   Imported By: Dierdre Harness 09/15/2010 14:17:40  _____________________________________________________________________  External Attachment:    Type:   Image     Comment:   External Document

## 2010-09-29 ENCOUNTER — Other Ambulatory Visit: Payer: Self-pay | Admitting: Family Medicine

## 2010-09-29 DIAGNOSIS — Z139 Encounter for screening, unspecified: Secondary | ICD-10-CM

## 2010-10-04 ENCOUNTER — Ambulatory Visit: Payer: Self-pay | Admitting: Orthopedic Surgery

## 2010-10-05 ENCOUNTER — Telehealth (INDEPENDENT_AMBULATORY_CARE_PROVIDER_SITE_OTHER): Payer: Self-pay | Admitting: *Deleted

## 2010-10-12 ENCOUNTER — Encounter: Payer: Self-pay | Admitting: Family Medicine

## 2010-10-13 NOTE — Progress Notes (Signed)
Summary: re-scheduled ortho referral appointment per patient  Phone Note Outgoing Call   Call placed to: Patient Summary of Call: Per Dr Aline Brochure referral appointment -  Patient was unable to keep her ortho referral appointment on 10/04/10 due to illness.  She re-scheduled (3rd re-schedule) - 11/03/10, 10:00am. Thank you. Initial call taken by: Ihor Austin,  October 05, 2010 12:42 PM  Follow-up for Phone Call        noted Follow-up by: Lenn Cal,  October 05, 2010 1:37 PM

## 2010-10-14 ENCOUNTER — Ambulatory Visit (HOSPITAL_COMMUNITY): Payer: Medicare Other

## 2010-10-18 NOTE — Miscellaneous (Signed)
Summary: NARC REFILL  Clinical Lists Changes  Medications: Rx of METHADONE HCL 10 MG TABS (METHADONE HCL) Take 1 tablet by mouth three times a day;  #90 x 0;  Signed;  Entered by: Kate Sable LPN;  Authorized by: Tula Nakayama MD;  Method used: Handwritten    Prescriptions: METHADONE HCL 10 MG TABS (METHADONE HCL) Take 1 tablet by mouth three times a day  #90 x 0   Entered by:   Kate Sable LPN   Authorized by:   Tula Nakayama MD   Signed by:   Kate Sable LPN on [id number]   Method used:   Handwritten   [id number]

## 2010-10-18 NOTE — Medication Information (Signed)
Summary: Visual merchandiser   Imported By: Dierdre Harness 10/12/2010 15:55:03  _____________________________________________________________________  External Attachment:    Type:   Image     Comment:   External Document

## 2010-10-22 LAB — DIFFERENTIAL
Basophils Absolute: 0.1 10*3/uL (ref 0.0–0.1)
Basophils Relative: 1 % (ref 0–1)
Eosinophils Absolute: 0.4 10*3/uL (ref 0.0–0.7)
Eosinophils Relative: 0 % (ref 0–5)
Lymphocytes Relative: 13 % (ref 12–46)
Lymphs Abs: 1.3 10*3/uL (ref 0.7–4.0)
Monocytes Absolute: 0.4 10*3/uL (ref 0.1–1.0)
Monocytes Relative: 4 % (ref 3–12)
Neutro Abs: 4.3 10*3/uL (ref 1.7–7.7)
Neutrophils Relative %: 41 % — ABNORMAL LOW (ref 43–77)

## 2010-10-22 LAB — TSH: TSH: 2.288 u[IU]/mL (ref 0.350–4.500)

## 2010-10-22 LAB — CARDIAC PANEL(CRET KIN+CKTOT+MB+TROPI)
CK, MB: 5.9 ng/mL — ABNORMAL HIGH (ref 0.3–4.0)
CK, MB: 6.1 ng/mL (ref 0.3–4.0)
Relative Index: 1.7 (ref 0.0–2.5)
Total CK: 417 U/L — ABNORMAL HIGH (ref 7–177)
Troponin I: 0.02 ng/mL (ref 0.00–0.06)

## 2010-10-22 LAB — HEPATIC FUNCTION PANEL
ALT: 21 U/L (ref 0–35)
AST: 25 U/L (ref 0–37)
Alkaline Phosphatase: 54 U/L (ref 39–117)
Bilirubin, Direct: 0.1 mg/dL (ref 0.0–0.3)
Indirect Bilirubin: 0.4 mg/dL (ref 0.3–0.9)
Total Bilirubin: 0.5 mg/dL (ref 0.3–1.2)

## 2010-10-22 LAB — BASIC METABOLIC PANEL
BUN: 19 mg/dL (ref 6–23)
CO2: 25 mEq/L (ref 19–32)
Calcium: 8.5 mg/dL (ref 8.4–10.5)
Chloride: 97 mEq/L (ref 96–112)
Creatinine, Ser: 0.82 mg/dL (ref 0.4–1.2)
GFR calc non Af Amer: >60 mL/min (ref >60)
Glucose, Bld: 84 mg/dL (ref 70–99)
Potassium: 3.6 mEq/L (ref 3.5–5.1)
Sodium: 130 mEq/L — ABNORMAL LOW (ref 135–145)
Sodium: 137 mEq/L (ref 135–145)

## 2010-10-22 LAB — CBC
HCT: 45.9 % (ref 36.0–46.0)
Hemoglobin: 15.5 g/dL — ABNORMAL HIGH (ref 12.0–15.0)
MCH: 31.4 pg (ref 26.0–34.0)
MCHC: 33.8 g/dL (ref 30.0–36.0)
MCV: 92.3 fL (ref 78.0–100.0)
Platelets: 215 10*3/uL (ref 150–400)
RDW: 13.9 % (ref 11.5–15.5)
RDW: 14.2 % (ref 11.5–15.5)
WBC: 9.9 10*3/uL (ref 4.0–10.5)

## 2010-10-22 LAB — GLUCOSE, CAPILLARY
Glucose-Capillary: 101 mg/dL — ABNORMAL HIGH (ref 70–99)
Glucose-Capillary: 103 mg/dL — ABNORMAL HIGH (ref 70–99)
Glucose-Capillary: 108 mg/dL — ABNORMAL HIGH (ref 70–99)
Glucose-Capillary: 128 mg/dL — ABNORMAL HIGH (ref 70–99)

## 2010-10-22 LAB — URINALYSIS, ROUTINE W REFLEX MICROSCOPIC
Glucose, UA: NEGATIVE mg/dL
Leukocytes, UA: NEGATIVE
Nitrite: NEGATIVE
pH: 7 (ref 5.0–8.0)

## 2010-10-22 LAB — T4, FREE: Free T4: 1 ng/dL (ref 0.80–1.80)

## 2010-10-22 LAB — CK TOTAL AND CKMB (NOT AT ARMC)
CK, MB: 4.3 ng/mL — ABNORMAL HIGH (ref 0.3–4.0)
Relative Index: 3 — ABNORMAL HIGH (ref 0.0–2.5)
Total CK: 141 U/L (ref 7–177)

## 2010-10-22 LAB — URINE CULTURE
Colony Count: NO GROWTH
Culture: NO GROWTH

## 2010-10-22 LAB — URINE MICROSCOPIC-ADD ON

## 2010-10-22 LAB — TROPONIN I: Troponin I: 0.02 ng/mL (ref 0.00–0.06)

## 2010-10-25 ENCOUNTER — Ambulatory Visit (HOSPITAL_COMMUNITY): Payer: Medicare Other

## 2010-10-25 ENCOUNTER — Encounter: Payer: Self-pay | Admitting: Family Medicine

## 2010-10-25 ENCOUNTER — Encounter: Payer: Self-pay | Admitting: Orthopedic Surgery

## 2010-10-26 ENCOUNTER — Encounter: Payer: Self-pay | Admitting: Orthopedic Surgery

## 2010-11-01 ENCOUNTER — Ambulatory Visit: Payer: Medicare Other | Admitting: Orthopedic Surgery

## 2010-11-03 ENCOUNTER — Ambulatory Visit: Payer: Medicare Other | Admitting: Orthopedic Surgery

## 2010-11-04 ENCOUNTER — Other Ambulatory Visit: Payer: Self-pay

## 2010-11-04 MED ORDER — METHADONE HCL 10 MG PO TABS: 10.0000 mg | ORAL_TABLET | Freq: Three times a day (TID) | ORAL | Status: DC

## 2010-11-07 ENCOUNTER — Encounter: Payer: Self-pay | Admitting: Family Medicine

## 2010-11-08 LAB — GLUCOSE, CAPILLARY
Glucose-Capillary: 102 mg/dL — ABNORMAL HIGH (ref 70–99)
Glucose-Capillary: 103 mg/dL — ABNORMAL HIGH (ref 70–99)
Glucose-Capillary: 104 mg/dL — ABNORMAL HIGH (ref 70–99)
Glucose-Capillary: 144 mg/dL — ABNORMAL HIGH (ref 70–99)
Glucose-Capillary: 169 mg/dL — ABNORMAL HIGH (ref 70–99)
Glucose-Capillary: 77 mg/dL (ref 70–99)
Glucose-Capillary: 80 mg/dL (ref 70–99)
Glucose-Capillary: 82 mg/dL (ref 70–99)
Glucose-Capillary: 88 mg/dL (ref 70–99)
Glucose-Capillary: 88 mg/dL (ref 70–99)
Glucose-Capillary: 106 mg/dL — ABNORMAL HIGH (ref 70–99)
Glucose-Capillary: 111 mg/dL — ABNORMAL HIGH (ref 70–99)
Glucose-Capillary: 113 mg/dL — ABNORMAL HIGH (ref 70–99)
Glucose-Capillary: 116 mg/dL — ABNORMAL HIGH (ref 70–99)
Glucose-Capillary: 122 mg/dL — ABNORMAL HIGH (ref 70–99)
Glucose-Capillary: 129 mg/dL — ABNORMAL HIGH (ref 70–99)
Glucose-Capillary: 132 mg/dL — ABNORMAL HIGH (ref 70–99)
Glucose-Capillary: 149 mg/dL — ABNORMAL HIGH (ref 70–99)
Glucose-Capillary: 89 mg/dL (ref 70–99)
Glucose-Capillary: 96 mg/dL (ref 70–99)
Glucose-Capillary: 98 mg/dL (ref 70–99)

## 2010-11-08 LAB — CBC
HCT: 40.3 % (ref 36.0–46.0)
HCT: 42.6 % (ref 36.0–46.0)
HCT: 32.6 % — ABNORMAL LOW (ref 36.0–46.0)
HCT: 32.7 % — ABNORMAL LOW (ref 36.0–46.0)
HCT: 33.6 % — ABNORMAL LOW (ref 36.0–46.0)
Hemoglobin: 13.6 g/dL (ref 12.0–15.0)
Hemoglobin: 13.9 g/dL (ref 12.0–15.0)
Hemoglobin: 14.1 g/dL (ref 12.0–15.0)
Hemoglobin: 14.6 g/dL (ref 12.0–15.0)
Hemoglobin: 11.3 g/dL — ABNORMAL LOW (ref 12.0–15.0)
MCHC: 33.8 g/dL (ref 30.0–36.0)
MCHC: 33.8 g/dL (ref 30.0–36.0)
MCHC: 34.3 g/dL (ref 30.0–36.0)
MCHC: 34.4 g/dL (ref 30.0–36.0)
MCHC: 34.5 g/dL (ref 30.0–36.0)
MCV: 95.4 fL (ref 78.0–100.0)
MCV: 95.8 fL (ref 78.0–100.0)
MCV: 97 fL (ref 78.0–100.0)
MCV: 96.6 fL (ref 78.0–100.0)
MCV: 97.8 fL (ref 78.0–100.0)
Platelets: 183 10*3/uL (ref 150–400)
Platelets: 200 10*3/uL (ref 150–400)
Platelets: 172 10*3/uL (ref 150–400)
Platelets: 203 10*3/uL (ref 150–400)
RBC: 4.23 MIL/uL (ref 3.87–5.11)
RBC: 4.24 MIL/uL (ref 3.87–5.11)
RBC: 4.32 MIL/uL (ref 3.87–5.11)
RBC: 4.46 MIL/uL (ref 3.87–5.11)
RBC: 3.36 MIL/uL — ABNORMAL LOW (ref 3.87–5.11)
RDW: 14.4 % (ref 11.5–15.5)
RDW: 13.6 % (ref 11.5–15.5)
RDW: 13.8 % (ref 11.5–15.5)
RDW: 14 % (ref 11.5–15.5)
WBC: 6.9 10*3/uL (ref 4.0–10.5)
WBC: 7.1 10*3/uL (ref 4.0–10.5)
WBC: 7.5 10*3/uL (ref 4.0–10.5)
WBC: 7.7 10*3/uL (ref 4.0–10.5)
WBC: 9.7 10*3/uL (ref 4.0–10.5)

## 2010-11-08 LAB — DIFFERENTIAL
Basophils Absolute: 0 10*3/uL (ref 0.0–0.1)
Basophils Absolute: 0 10*3/uL (ref 0.0–0.1)
Basophils Absolute: 0.1 10*3/uL (ref 0.0–0.1)
Basophils Absolute: 0.1 10*3/uL (ref 0.0–0.1)
Basophils Relative: 1 % (ref 0–1)
Basophils Relative: 1 % (ref 0–1)
Basophils Relative: 1 % (ref 0–1)
Basophils Relative: 1 % (ref 0–1)
Basophils Relative: 1 % (ref 0–1)
Eosinophils Absolute: 0.4 10*3/uL (ref 0.0–0.7)
Eosinophils Absolute: 0.4 10*3/uL (ref 0.0–0.7)
Eosinophils Absolute: 0.5 10*3/uL (ref 0.0–0.7)
Eosinophils Relative: 6 % — ABNORMAL HIGH (ref 0–5)
Eosinophils Relative: 4 % (ref 0–5)
Lymphocytes Relative: 42 % (ref 12–46)
Lymphocytes Relative: 46 % (ref 12–46)
Lymphocytes Relative: 46 % (ref 12–46)
Lymphs Abs: 2.9 10*3/uL (ref 0.7–4.0)
Lymphs Abs: 3.2 10*3/uL (ref 0.7–4.0)
Lymphs Abs: 3.2 10*3/uL (ref 0.7–4.0)
Monocytes Absolute: 0.6 10*3/uL (ref 0.1–1.0)
Monocytes Absolute: 0.6 10*3/uL (ref 0.1–1.0)
Monocytes Relative: 10 % (ref 3–12)
Monocytes Relative: 7 % (ref 3–12)
Monocytes Relative: 9 % (ref 3–12)
Neutro Abs: 2.7 10*3/uL (ref 1.7–7.7)
Neutro Abs: 2.8 10*3/uL (ref 1.7–7.7)
Neutro Abs: 3.1 10*3/uL (ref 1.7–7.7)
Neutro Abs: 3.3 10*3/uL (ref 1.7–7.7)
Neutro Abs: 5.8 10*3/uL (ref 1.7–7.7)
Neutrophils Relative %: 39 % — ABNORMAL LOW (ref 43–77)
Neutrophils Relative %: 40 % — ABNORMAL LOW (ref 43–77)
Neutrophils Relative %: 42 % — ABNORMAL LOW (ref 43–77)
Neutrophils Relative %: 45 % (ref 43–77)
Neutrophils Relative %: 60 % (ref 43–77)

## 2010-11-08 LAB — ANTIPHOSPHOLIPID SYNDROME EVAL, BLD
Anticardiolipin IgA: <3 APL U/mL — ABNORMAL LOW (ref <10)
Anticardiolipin IgG: 3 GPL U/mL — ABNORMAL LOW (ref <10)
dRVVT Incubated 1:1 Mix: 38.7 secs (ref 36.1–47.0)

## 2010-11-08 LAB — BASIC METABOLIC PANEL
BUN: 19 mg/dL (ref 6–23)
BUN: 23 mg/dL (ref 6–23)
BUN: 25 mg/dL — ABNORMAL HIGH (ref 6–23)
BUN: 12 mg/dL (ref 6–23)
CO2: 31 mEq/L (ref 19–32)
CO2: 32 mEq/L (ref 19–32)
Calcium: 9.2 mg/dL (ref 8.4–10.5)
Calcium: 9.5 mg/dL (ref 8.4–10.5)
Chloride: 101 mEq/L (ref 96–112)
Chloride: 99 mEq/L (ref 96–112)
Creatinine, Ser: 0.94 mg/dL (ref 0.4–1.2)
Creatinine, Ser: 1.02 mg/dL (ref 0.4–1.2)
Creatinine, Ser: 1.76 mg/dL — ABNORMAL HIGH (ref 0.4–1.2)
GFR calc Af Amer: 35 mL/min — ABNORMAL LOW (ref >60)
GFR calc Af Amer: >60 mL/min (ref >60)
GFR calc Af Amer: >60 mL/min (ref >60)
GFR calc non Af Amer: 45 mL/min — ABNORMAL LOW (ref >60)
GFR calc non Af Amer: 53 mL/min — ABNORMAL LOW (ref >60)
Glucose, Bld: 120 mg/dL — ABNORMAL HIGH (ref 70–99)
Glucose, Bld: 102 mg/dL — ABNORMAL HIGH (ref 70–99)
Potassium: 3.8 mEq/L (ref 3.5–5.1)
Potassium: 4.2 mEq/L (ref 3.5–5.1)
Potassium: 3.9 mEq/L (ref 3.5–5.1)
Sodium: 137 mEq/L (ref 135–145)

## 2010-11-08 LAB — PROTIME-INR
INR: 1 (ref 0.00–1.49)
INR: 1.05 (ref 0.00–1.49)
INR: 1.08 (ref 0.00–1.49)
INR: 1.77 — ABNORMAL HIGH (ref 0.00–1.49)
INR: 2.02 — ABNORMAL HIGH (ref 0.00–1.49)
INR: 2.63 — ABNORMAL HIGH (ref 0.00–1.49)
Prothrombin Time: 13.6 seconds (ref 11.6–15.2)
Prothrombin Time: 13.9 seconds (ref 11.6–15.2)
Prothrombin Time: 20.5 seconds — ABNORMAL HIGH (ref 11.6–15.2)
Prothrombin Time: 22.7 seconds — ABNORMAL HIGH (ref 11.6–15.2)
Prothrombin Time: 27.9 seconds — ABNORMAL HIGH (ref 11.6–15.2)

## 2010-11-08 LAB — COMPREHENSIVE METABOLIC PANEL
Alkaline Phosphatase: 44 U/L (ref 39–117)
BUN: 16 mg/dL (ref 6–23)
BUN: 16 mg/dL (ref 6–23)
CO2: 32 mEq/L (ref 19–32)
Chloride: 100 mEq/L (ref 96–112)
Chloride: 97 mEq/L (ref 96–112)
Creatinine, Ser: 0.88 mg/dL (ref 0.4–1.2)
Creatinine, Ser: 1 mg/dL (ref 0.4–1.2)
GFR calc non Af Amer: 55 mL/min — ABNORMAL LOW (ref >60)
GFR calc non Af Amer: >60 mL/min (ref >60)
Glucose, Bld: 98 mg/dL (ref 70–99)
Potassium: 4.7 mEq/L (ref 3.5–5.1)
Total Bilirubin: 0.4 mg/dL (ref 0.3–1.2)
Total Bilirubin: 0.4 mg/dL (ref 0.3–1.2)

## 2010-11-08 LAB — URINE MICROSCOPIC-ADD ON

## 2010-11-08 LAB — URINALYSIS, ROUTINE W REFLEX MICROSCOPIC
Nitrite: NEGATIVE
Specific Gravity, Urine: 1.013 (ref 1.005–1.030)
Urobilinogen, UA: 4 mg/dL — ABNORMAL HIGH (ref 0.0–1.0)
pH: 6.5 (ref 5.0–8.0)

## 2010-11-08 LAB — SAMPLE TO BLOOD BANK

## 2010-11-08 LAB — HEMOGLOBIN AND HEMATOCRIT, BLOOD
HCT: 35.2 % — ABNORMAL LOW (ref 36.0–46.0)
Hemoglobin: 12.8 g/dL (ref 12.0–15.0)

## 2010-11-08 LAB — FACTOR 5 LEIDEN

## 2010-11-08 LAB — APTT: aPTT: 38 seconds — ABNORMAL HIGH (ref 24–37)

## 2010-11-08 LAB — PREPARE FRESH FROZEN PLASMA

## 2010-11-08 LAB — URINE CULTURE: Culture: NO GROWTH

## 2010-11-08 LAB — TYPE AND SCREEN

## 2010-11-08 LAB — PHOSPHORUS: Phosphorus: 6.5 mg/dL — ABNORMAL HIGH (ref 2.3–4.6)

## 2010-11-08 LAB — LIPID PANEL
LDL Cholesterol: 96 mg/dL (ref 0–99)
Total CHOL/HDL Ratio: 5.2 RATIO
VLDL: 51 mg/dL — ABNORMAL HIGH (ref 0–40)

## 2010-11-08 LAB — HEMOGLOBIN A1C: Hgb A1c MFr Bld: 6.9 % — ABNORMAL HIGH (ref 4.6–6.1)

## 2010-11-08 LAB — CARDIAC PANEL(CRET KIN+CKTOT+MB+TROPI)
CK, MB: 6.6 ng/mL — ABNORMAL HIGH (ref 0.3–4.0)
Relative Index: 2.9 — ABNORMAL HIGH (ref 0.0–2.5)
Total CK: 230 U/L — ABNORMAL HIGH (ref 7–177)

## 2010-11-08 LAB — MAGNESIUM: Magnesium: 2 mg/dL (ref 1.5–2.5)

## 2010-11-09 ENCOUNTER — Ambulatory Visit: Payer: Self-pay | Admitting: Family Medicine

## 2010-11-09 ENCOUNTER — Ambulatory Visit (INDEPENDENT_AMBULATORY_CARE_PROVIDER_SITE_OTHER): Payer: Medicare Other | Admitting: Family Medicine

## 2010-11-09 ENCOUNTER — Encounter: Payer: Self-pay | Admitting: Family Medicine

## 2010-11-09 VITALS — BP 134/80 | HR 87 | Resp 16 | Ht 62.5 in | Wt 191.1 lb

## 2010-11-09 DIAGNOSIS — M549 Dorsalgia, unspecified: Secondary | ICD-10-CM

## 2010-11-09 DIAGNOSIS — I10 Essential (primary) hypertension: Secondary | ICD-10-CM

## 2010-11-09 DIAGNOSIS — E785 Hyperlipidemia, unspecified: Secondary | ICD-10-CM

## 2010-11-09 DIAGNOSIS — E119 Type 2 diabetes mellitus without complications: Secondary | ICD-10-CM

## 2010-11-09 DIAGNOSIS — G4700 Insomnia, unspecified: Secondary | ICD-10-CM

## 2010-11-09 MED ORDER — TRAZODONE HCL 100 MG PO TABS: 100.0000 mg | ORAL_TABLET | Freq: Every day | ORAL | Status: DC

## 2010-11-09 NOTE — Patient Instructions (Addendum)
F/u in 4 months.  PLs stop xanax.   I will start prescribing trazodone and let Dr. Merlene Laughter know.  Fasting lipid, hepatic, chem 7 and hBA1c in 4 monthsI

## 2010-11-10 NOTE — Progress Notes (Signed)
Subjective:    Patient ID: Katelyn Rowland, female    DOB: 07/02/1941, 70 y.o.   MRN: [id number] Hyperlipidemia This is a chronic problem. The problem is controlled. There are no compliance problems.  Risk factors for coronary artery disease include diabetes mellitus, hypertension, obesity, post-menopausal and a sedentary lifestyle.  Back Pain This is a chronic problem. The current episode started more than 1 year ago. The problem occurs constantly. The problem is unchanged. The pain is present in the lumbar spine and thoracic spine. The quality of the pain is described as burning and aching. The pain is at a severity of 5/10. The treatment provided moderate relief.  Diabetes She presents for her follow-up diabetic visit. No MedicAlert identification noted. Her disease course has been improving. There are no diabetic associated symptoms. Risk factors for coronary artery disease include obesity, dyslipidemia, post-menopausal and sedentary lifestyle. Current diabetic treatment includes oral agent (monotherapy). Her weight is stable. Her breakfast blood glucose range is generally 70-90 mg/dl. She does not see a podiatrist. Hypertension This is a chronic problem. The current episode started more than 1 year ago. The problem is unchanged. The problem is controlled. There is no history of CAD/MI, heart failure or left ventricular hypertrophy.    Hypertension This is a chronic problem. The current episode started more than 1 year ago. The problem is unchanged. The problem is controlled. There is no history of CAD/MI, heart failure or left ventricular hypertrophy.  The PT is here for follow up and re-evaluation of chronic medical conditions, medication management and review of recent lab and radiology data.  Preventive health is updated, specifically  Cancer screening, Osteoporosis screening and Immunization.   Questions or concerns regarding consultations or procedures which the PT has had in the interim  are  addressed. The PT denies any adverse reactions to current medications since the last visit.  Katelyn Rowland states she continues to improve with time, she has decided to totally discontinue xanax, and now takes her trazodone nearer to sleep time with excellent results.She is requesting I write this med instead of neurology and I am comfortable with this There are no specific complaints Her back pain is adequately controlled on current pain meds which she intends to continue. Nicotine use has been discontinued for over 1 year , which is great        Review of Systems  Constitutional: Negative for chills, activity change, appetite change and unexpected weight change.  HENT: Negative for hearing loss, ear pain, congestion, sore throat, rhinorrhea, sneezing, trouble swallowing, neck stiffness, postnasal drip and sinus pressure.   Eyes: Negative for photophobia, pain, discharge, redness, itching and visual disturbance.  Respiratory: Negative for cough, chest tightness and wheezing.   Cardiovascular: Negative for leg swelling.  Gastrointestinal: Negative for nausea, vomiting, diarrhea, constipation and blood in stool.  Genitourinary: Negative for frequency, hematuria and flank pain.  Musculoskeletal: Positive for back pain and joint swelling. Negative for arthralgias and gait problem.  Skin: Negative for rash and wound.  Neurological: Negative.   Hematological: Negative for adenopathy. Does not bruise/bleed easily.  Psychiatric/Behavioral: Negative for suicidal ideas, hallucinations, behavioral problems, sleep disturbance and decreased concentration. The patient is not hyperactive.        Objective:   Physical Exam  Nursing note and vitals reviewed. Constitutional: She is oriented to person, place, and time. She appears well-developed and well-nourished.  HENT:  Head: Normocephalic.  Right Ear: External ear normal.  Left Ear: External ear normal.  Mouth/Throat: No oropharyngeal  exudate.  Eyes: Conjunctivae and EOM are normal. Right eye exhibits no discharge. Left eye exhibits no discharge. No scleral icterus.  Neck: Normal range of motion. Neck supple. No JVD present. No tracheal deviation present. No thyromegaly present.  Cardiovascular: Normal rate, regular rhythm, normal heart sounds and intact distal pulses.   No murmur heard. Pulmonary/Chest: Effort normal and breath sounds normal. No stridor. No respiratory distress. She has no wheezes. She has no rales. She exhibits no tenderness.  Abdominal: Soft. Bowel sounds are normal. There is no tenderness. There is no rebound and no guarding.  Musculoskeletal: She exhibits no edema.       Decreased ROM  thoracolumbar spine  Lymphadenopathy:    She has no cervical adenopathy.  Neurological: She is alert and oriented to person, place, and time. No cranial nerve deficit. Coordination normal.  Skin: Skin is warm and dry. No rash noted. No erythema.  Psychiatric: She has a normal mood and affect. Her behavior is normal. Judgment and thought content normal.          Assessment & Plan:  1. Hypertension: Hypertension:Controlled, no changes in medication.  2.Obesity:Unchanged, lifestyle change encouraged. 3.Hyperlipidemia: excellent control when last checked. 4. Hypothyroid: needs rept TSH. 5.Back pain : unhcanged: continue meds.

## 2010-11-11 MED ORDER — METHADONE HCL 10 MG PO TABS: 10.0000 mg | ORAL_TABLET | Freq: Three times a day (TID) | ORAL | Status: DC

## 2010-11-14 ENCOUNTER — Telehealth: Payer: Self-pay | Admitting: Family Medicine

## 2010-11-14 NOTE — Telephone Encounter (Signed)
Her chart reports prednisone allergy , she cannot get a dose pack, advise benadryl for itching, pls check in eMR and see if she ever got depomedrol, this will be an option to offer if she has, pls let me know

## 2010-11-15 ENCOUNTER — Telehealth: Payer: Self-pay | Admitting: Family Medicine

## 2010-11-15 ENCOUNTER — Other Ambulatory Visit: Payer: Self-pay

## 2010-11-15 DIAGNOSIS — L237 Allergic contact dermatitis due to plants, except food: Secondary | ICD-10-CM

## 2010-11-15 MED ORDER — HYDROXYZINE HCL 10 MG PO TABS: 10.0000 mg | ORAL_TABLET | Freq: Three times a day (TID) | ORAL | Status: AC | PRN

## 2010-11-15 NOTE — Telephone Encounter (Signed)
Already sent the message to Dr. Moshe Cipro

## 2010-11-15 NOTE — Telephone Encounter (Signed)
pls send hydroxyzine 25mg  one 3 times daily as needed for itching #30 only, let her know this is like benadryl and may make her sleepy, pLS ask them to deliver

## 2010-11-15 NOTE — Telephone Encounter (Signed)
Med sent patient aware

## 2010-11-15 NOTE — Telephone Encounter (Signed)
If you would call in something by 3:00 today they will deliver

## 2010-11-17 ENCOUNTER — Other Ambulatory Visit: Payer: Self-pay

## 2010-11-17 ENCOUNTER — Telehealth: Payer: Self-pay | Admitting: Family Medicine

## 2010-11-17 DIAGNOSIS — L237 Allergic contact dermatitis due to plants, except food: Secondary | ICD-10-CM

## 2010-11-17 LAB — POCT CARDIAC MARKERS
CKMB, poc: 3 ng/mL (ref 1.0–8.0)
Myoglobin, poc: 87.3 ng/mL (ref 12–200)
Troponin i, poc: <0.05 ng/mL (ref 0.00–0.09)

## 2010-11-17 LAB — DIFFERENTIAL
Basophils Absolute: 0 10*3/uL (ref 0.0–0.1)
Eosinophils Absolute: 0 10*3/uL (ref 0.0–0.7)
Eosinophils Relative: 1 % (ref 0–5)
Lymphs Abs: 1.8 10*3/uL (ref 0.7–4.0)
Monocytes Absolute: 0.3 10*3/uL (ref 0.1–1.0)

## 2010-11-17 LAB — COMPREHENSIVE METABOLIC PANEL
ALT: 18 U/L (ref 0–35)
AST: 27 U/L (ref 0–37)
CO2: 20 mEq/L (ref 19–32)
Chloride: 102 mEq/L (ref 96–112)
GFR calc Af Amer: >60 mL/min (ref >60)
GFR calc non Af Amer: >60 mL/min (ref >60)
Sodium: 132 mEq/L — ABNORMAL LOW (ref 135–145)
Total Bilirubin: 0.7 mg/dL (ref 0.3–1.2)

## 2010-11-17 LAB — URINE MICROSCOPIC-ADD ON

## 2010-11-17 LAB — URINALYSIS, ROUTINE W REFLEX MICROSCOPIC
Bilirubin Urine: NEGATIVE
Glucose, UA: NEGATIVE mg/dL
Ketones, ur: NEGATIVE mg/dL
Leukocytes, UA: NEGATIVE
Protein, ur: >300 mg/dL — AB

## 2010-11-17 LAB — CBC
RBC: 5.03 MIL/uL (ref 3.87–5.11)
WBC: 6.1 10*3/uL (ref 4.0–10.5)

## 2010-11-17 MED ORDER — PREDNISONE (PAK) 10 MG PO TABS: ORAL_TABLET | ORAL | Status: DC

## 2010-11-17 NOTE — Telephone Encounter (Signed)
She is miserable and the poison oak has spread. She wants the steroids because she remembered she has took them before. (The tabs)  She has just had a reaction to the steroids injected in her spine. She took tabs once before with poison oak. She is miserable [place]

## 2010-11-17 NOTE — Telephone Encounter (Signed)
pls send in a prednisone 10mg  dose pack x 6 days and let her know

## 2010-11-17 NOTE — Telephone Encounter (Signed)
Called and patient aware.

## 2010-11-30 ENCOUNTER — Telehealth (INDEPENDENT_AMBULATORY_CARE_PROVIDER_SITE_OTHER): Payer: Medicare Other | Admitting: Family Medicine

## 2010-11-30 DIAGNOSIS — G4700 Insomnia, unspecified: Secondary | ICD-10-CM

## 2010-11-30 NOTE — Telephone Encounter (Signed)
Okay to refill? 

## 2010-12-01 MED ORDER — TRAZODONE HCL 100 MG PO TABS: 100.0000 mg | ORAL_TABLET | Freq: Every day | ORAL | Status: DC

## 2010-12-01 MED ORDER — EZETIMIBE-SIMVASTATIN 10-40 MG PO TABS: 1.0000 | ORAL_TABLET | Freq: Every day | ORAL | Status: DC

## 2010-12-01 NOTE — Telephone Encounter (Signed)
pls refill both for 4 months

## 2010-12-01 NOTE — Telephone Encounter (Signed)
meds sent

## 2010-12-02 ENCOUNTER — Other Ambulatory Visit: Payer: Self-pay

## 2010-12-02 DIAGNOSIS — M549 Dorsalgia, unspecified: Secondary | ICD-10-CM

## 2010-12-02 MED ORDER — METHADONE HCL 10 MG PO TABS: 10.0000 mg | ORAL_TABLET | Freq: Three times a day (TID) | ORAL | Status: DC

## 2010-12-07 ENCOUNTER — Telehealth (INDEPENDENT_AMBULATORY_CARE_PROVIDER_SITE_OTHER): Payer: Medicare Other | Admitting: Family Medicine

## 2010-12-07 DIAGNOSIS — E785 Hyperlipidemia, unspecified: Secondary | ICD-10-CM

## 2010-12-07 MED ORDER — EZETIMIBE-SIMVASTATIN 10-40 MG PO TABS: 1.0000 | ORAL_TABLET | Freq: Every day | ORAL | Status: DC

## 2010-12-07 NOTE — Telephone Encounter (Signed)
Med sent in to [facility] again. Patient aware

## 2010-12-20 NOTE — H&P (Signed)
Katelyn Rowland, Katelyn Rowland                ACCOUNT NO.:  [id number]   MEDICAL RECORD NO.:  [id number]          PATIENT TYPE:  INP   LOCATION:  A222                          FACILITY:  [facility]   PHYSICIAN:  Bonnielee Haff, MD     DATE OF BIRTH:  09-05-1940   DATE OF ADMISSION:  12/21/2006  DATE OF DISCHARGE:  [facility]                              HISTORY & PHYSICAL   PRIMARY CARE PHYSICIAN:  Dr. Tula Nakayama.   ADMISSION DIAGNOSES:  1. Bilateral severe lower extremity edema of unclear etiology.  2. Rule out right-sided heart failure.  3. Morbid obesity.  4. History of hypothyroidism.  5. Hypertension under good control.  6. Constipation.  7. Chronic pain syndrome.  8. History of depression.  9. History of arthritis.   CHIEF COMPLAINT:  Lower extremity edema worsened over the past one  month.   HISTORY OF PRESENT ILLNESS:  The patient is a 70 year old Caucasian  female who has medical problems as stated above who presented to the  doctor's office today complaining of worsening lower extremity edema.  According to the patient her leg swelling started a few years ago, but  especially over the past two or three months they have been getting  worse.  She was put on oral diuretics as an outpatient, but they are not  helping her at all.  According to her PMD, the patient has gained a lot  of weight over the past couple of months.  According to the patient she  has gained almost 60 pounds over the last one year.  She denies any  shortness of breath or chest pain.  She had some neck pain.  She has had  surgeries to her neck in the past, but denies any dysphagia or  odynophagia.  Denies any vomiting.  Has occasional nausea.  She does not  have any cough, fever.  No history of any trauma.   MEDICATIONS AT HOME:  This is from a list provided by Dr. Griffin Dakin  office.  1. Seroquel 300 mg q.h.s.  2. Prevacid 30 mg once daily.  3. Methadone 10 mg every four hours as needed for pain.  4. Synthroid  175 mcg, takes 1-1/2 tablets Monday through Friday and      one tablet Saturday and Sunday.  5. Vytorin 10/20 one tablet at bedtime.  6. Furosemide 20 mg p.o. b.i.d.  7. MiraLax.  8. Amitiza 24 mcg b.i.d.  9. Os-Cal Plus D 500 mg t.i.d.   ALLERGIES:  She states she is allergic to STEROIDS and TYLENOL.  She is  also intolerant to MILK PRODUCTS.   PAST MEDICAL HISTORY:  Morbid obesity, chronic pain in the back,  hypothyroidism, dyslipidemia, constipation, birth control, hypertension  though she does not appear to be on any medication.  She did have a DVT  back in July 2005 that was treated with anticoagulation.  She has been  evaluated in the past for dysphagia but no clear etiology.   SURGICAL HISTORY:  Neck surgery in 2002, hysterectomy, oophorectomy,  appendectomy in the past.  No history of lung disease, diabetes or  heart  disease that she knows of.   SOCIAL HISTORY:  Lives in [place] with her daughter-in-law.  Smokes a  pack of cigarettes per day.  Has a 36 pack-year history of smoking, no  alcohol use, no illicit drug use.  Is able to ambulate independently.   FAMILY HISTORY:  Diabetes apparently runs in the family.  Her daughter  died of unknown cancer in 26-Nov-2001.  Son died of aneurysm one year ago.  Her  husband also had colon cancer and died of complications about a year  ago.   REVIEW OF SYSTEMS:  GENERAL:  Positive for weakness. HEENT:  Unremarkable. GI:  Unremarkable. GU:  Unremarkable.  CARDIOVASCULAR:  Unremarkable. RESPIRATORY:  Unremarkable.  MUSCULOSKELETAL:  See HPI. ENDOCRINE:  Unremarkable. NEUROLOGICAL:  Unremarkable.  The patient admits to snoring at nighttime.   PHYSICAL EXAMINATION:  VITAL SIGNS:  Temperature recorded as 97.7, heart  rate 75, respiratory rate at about 16, blood pressure 144/81, weight is  not measured, saturation 98% on room air.  GENERAL:  While examining and while obtaining history, the patient tends  to fall asleep.  Upon questioning  she said that she slept only two hours  yesterday.  A morbidly obese female nodding off to sleep periodically  who appears to be in no distress.  HEENT:  There is no pallor, no icterus.  Oral mucous membranes moist.  No oral lesions are noted.  NECK:  Soft and supple.  Full range of motion.  I do not appreciate any  thyromegaly.  LUNGS:  Clear to auscultation bilaterally.  Occasional wheezes  appreciated.  HEART:  Reveals S1, S2 are normal and regular.  No murmurs were  appreciated.  No S3, S4.  No JVD, no bruits are appreciated.  ABDOMEN:  Obese, nontender, nondistended.  Bowel sounds are present.  No  mass or organomegaly is appreciated.  EXTREMITIES:  Both legs are edematous nonpitting, tender to touch,  slightly warm and slightly erythematous but not significantly.  Peripheral pulses are poor but palpable faintly.  NEUROLOGICAL:  The patient is alert, somnolent, oriented to time, place,  and person.  No focal or neurological deficits are present.   LABORATORY DATA:  Unfortunately no labs are available at this point.   No imaging studies done so far, but it should be noted she did have a  venous Doppler study a few days ago which was negative for DVT in the  lower extremities.  She also had an echocardiogram which was read by  Dr. Lattie Haw which revealed normal left ventricular systolic function;  trivial tricuspid regurgitation was noted.   ASSESSMENT:  This is a 70 year old Caucasian female with medical  problems as stated earlier who was sent by PMD's office for direct  admission for worsening lower extremity edema and significant weight  gain.  She is thought to require IV diuretics.  She does have  significant edema in the lower legs most 3 or 4+ and nonpitting.  Differential diagnosis includes right heart failure.  The patient could  have sleep apnea considering her somnolence and history of snoring in the nighttime which could cause right heart strain.  It does not  appear  that she has left heart failure at this point based on the recent  echocardiogram.  She could have renal dysfunction as well.  She could  have hepatic dysfunction both of which can cause lower extremity edema.  Deep venous thrombosis has been ruled out.  Cellulitis is very less  likely in this  individual at this point.  Hypothyroidism could also be  playing a role.   PLAN:  Lower extremity edema.  We will give her IV diuretics.  Try to  diurese over the next few days, and hopefully get her weight down a  little bit in the next few days as well.  We will get some lab work,  CBC, CMP, coag profile.  Check a BNP.  Get an x-ray, EKG.  Check LFTs,  TSH, free T4.  Check cardiac enzymes as well.  She will definitely need  to have a sleep study as an outpatient.   Continue rest of her medications for now at this point.  DVT, GI  prophylaxis will be initiated.   The patient is a full code.   Further management decision will be based on results of initial testing  and patient's response to treatment.   Nicotine patch will also be provided.  Daily weights and ins and outs  will be recorded very closely.      Bonnielee Haff, MD  Electronically Signed     GK/MEDQ  D:  12/21/2006  T:  12/21/2006  Job:  [id number]   cc:   Norwood Levo. Moshe Cipro, M.D.  Fax: [phone]

## 2010-12-20 NOTE — Assessment & Plan Note (Signed)
[place] CARDIOLOGY OFFICE NOTE   NAME:Katelyn Rowland, Katelyn Rowland                       MRN:          [id number]  DATE:03/06/2007                            DOB:          [birth year]    Katelyn Rowland is a pleasant 70 year old patient referred by the [facility]  service after hospitalization at [facility] for CHF   The patient is a poor historian.   She apparently was admitted to the hospital recently with increasing  lower extremity edema.   The discharge diagnosis indicated severe lower extremity edema of  unclear etiology.  However, in talking to the patient, she indicates she  has had previous blood clots in her legs.  She seems to indicate that  she was diagnosed with this 2 years ago.  She said she was on Coumadin  for a while, although I have no record of this.  The patient was  diagnosed with congestive heart failure.   However, it appears that this is primarily right-sided with lower  extremity edema.  She has chronic exertional dyspnea.  The dyspnea is  not changed at all.  There is no history of PE, although she does say  that she has a history of DVT.  The patient has no PND or orthopnea.  She has chronic lower extremity edema, which is much improved since her  hospital discharge.  She says she has lost over 25 pounds.  In talking  to the patient, she is not very compliant with a low salt diet.  She  also is not very compliant with a low cholesterol diet.   Her activities are limited by severe arthritis.   We are asked to evaluate her in regard to her lower extremity edema and  heart failure.   As indicated, the patient seems to think she is improving.  She  certainly has done well since hospital discharge, losing about 25  pounds.  She had a 2D echocardiogram on Dec 19, 2006 that showed normal  RV and LV function, although the images were somewhat suboptimal.   She has not had a stress test.   REVIEW OF  SYSTEMS:  Otherwise, negative.   PAST MEDICAL HISTORY:  Includes a hysterectomy, oophorectomy, previous  back surgery.  She has a metal plate in her neck.  She has hypertension,  chronic back pain, question of DVT some time in the last 3 years with  previous Coumadin therapy.  She also has hypercholesterolemia on  therapy, GERD on therapy, and hypothyroidism on therapy.   She is allergic to STEROIDS.  She says that they make her crazy, hot and  red, and make her blood boil.  She is a smoker.   The patient is widowed.  She has 3 children in the area.  She does not  work.  She smokes, but does not drink.   FAMILY HISTORY:  Remarkable for her father dying of an MI at age 32,  mother dying of heart failure at age 39.   CURRENT MEDICATIONS:  1. Xanax 0.25 at night.  2. Alprazolam 1 mg t.i.d.  3. Vytorin 10/20.  4. Lasix 20 b.i.d.  5. Os-Cal.  6. Potassium 20 a day.  7. Prevacid 30 a day.  8. Stool softeners.  9. Synthroid dose unclear.   EXAM:  Remarkable for an overweight and somewhat confused white female  in no distress.  Affect is appropriate, although she appears to have  somewhat halting speech.  Her weight is 227, blood pressure 172/80, respirations 14, pulse is 70  and regular.  She is afebrile.  HEENT:  Normal.  There is no JVP elevation.  No V wave.  No thyromegaly.  No lymphadenopathy.  LUNGS:  Clear with good diaphragmatic motion.  No wheezing.  There is an S1, S2 with distant heart sounds.  There is no RV lift.  ABDOMEN:  Protuberant.  There is no ascites.  Bowel sounds positive.  No  tenderness.  No hepatosplenomegaly.  No hepatojugular reflux.  The patient has +1 to 2 lower extremity edema bilaterally.  It appears  chronic.  There are some stasis changes.  There are minor varicosities.  PT plus 2 bilaterally.  NEURO:  Nonfocal.  There is no muscular weakness.   ELECTROCARDIOGRAM:  Sinus rhythm with nonspecific ST-T wave changes.   IMPRESSION:  1. Question of  congestive heart failure.  She has normal left      ventricular function by echo.  If there is any contribution of her      heart to her dyspnea, it would be from diastolic dysfunction.  Her      blood pressure is currently under good control and she is still not      euvolemic, but improved.  2. 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  It may be that she would benefit from      more chronic Coumadin therapy.  I also suspect that she could      tolerate a little bit more of a diuretic, such as Lasix 40 in the      morning and 20 at night.  We will check a venous duplex study to      see what sort of residual phlebitis the patient has.  She should      wear compression stockings and continue a low salt diet.  The      addition of Zaroxolyn 3 times a week would also be worthwhile with      regard to getting her down to a baseline weight.  3. Hypothyroidism.  Continue Synthroid.  Current dose unknown.  TSH      and T4 would be helpful with regard to making sure she is      adequately replaced and that low thyroid is not contributing to her      edema.  4. Hypercholesterolemia.  No documented coronary artery disease.      Continue Vytorin 10/20.  5. Reflux.  Continue Prevacid 30 a day.   Overall, I think that the patient's symptoms are primarily related to  right-sided failure with post-phlebitic syndrome.   We will try to get further records to see exactly how this was diagnosed  and how long she was on Coumadin for.     Wallis Bamberg. Johnsie Cancel, MD, [facility]  Electronically Signed    PCN/MedQ  DD: 03/06/2007  DT: 03/07/2007  Job #: [id number]   cc:   Norwood Levo. Moshe Cipro, M.D.

## 2010-12-20 NOTE — Procedures (Signed)
Katelyn Rowland, Katelyn Rowland                ACCOUNT NO.:  [id number]   MEDICAL RECORD NO.:  [id number]          PATIENT TYPE:  OUT   LOCATION:  RAD                           FACILITY:  [facility]   PHYSICIAN:  Cristopher Estimable. Lattie Haw, MD, FACCDATE OF BIRTH:  [birth year]   DATE OF PROCEDURE:  12/19/2006  DATE OF DISCHARGE:                                ECHOCARDIOGRAM   CLINICAL DATA:  70 year old woman with evidence of congestive heart  failure.   1. Technically suboptimal and limited echocardiographic study.  2. The left atrium, right ventricle and proximal ascending aorta are      all grossly normal.  The mitral valve is normal.  The aortic valve      is not adequately imaged.  3. The tricuspid valve is not adequately imaged; trivial regurgitation      is present.  4. Normal left ventricular size; grossly normal left ventricular      systolic function.  5. Normal IVC.      Cristopher Estimable. Lattie Haw, MD, [facility]     RMR/MEDQ  D:  12/19/2006  T:  12/20/2006  Job:  [id number]

## 2010-12-20 NOTE — Discharge Summary (Signed)
[name], Katelyn Rowland                ACCOUNT NO.:  [id number]   MEDICAL RECORD NO.:  [id number]          PATIENT TYPE:  INP   LOCATION:  A222                          FACILITY:  [facility]   PHYSICIAN:  [name], DO    DATE OF BIRTH:  1941/05/06   DATE OF ADMISSION:  12/21/2006  DATE OF DISCHARGE:  05/20/2008[facility]                               DISCHARGE SUMMARY   ADMISSION DIAGNOSES:  1. Bilateral severe lower extremity edema of unclear etiology.  2. Morbid obesity.  3. History of hypertension.  4. Hypothyroidism.  5. Chronic pain syndrome.  6. Depression.  7. Arthritis.   DISCHARGE DIAGNOSES:  1. Bilateral severe lower extremity edema.  2. Right-sided heart failure.  3. Morbid obesity.  4. Hypothyroidism.  5. Hypertension.  6. Chronic pain syndrome.   PRIMARY CARE PHYSICIAN:  Dr. Moshe Cipro.   While she was here, the patient had   Dictation ended at this point.      [name], DO  Electronically Signed     CB/MEDQ  D:  12/25/2006  T:  12/25/2006  Job:  [id number]

## 2010-12-20 NOTE — Discharge Summary (Signed)
Katelyn Rowland                ACCOUNT NO.:  [id number]   MEDICAL RECORD NO.:  [id number]          PATIENT TYPE:  INP   LOCATION:  A222                          FACILITY:  [facility]   PHYSICIAN:  [name], DO    DATE OF BIRTH:  March 22, 1941   DATE OF ADMISSION:  12/21/2006  DATE OF DISCHARGE:  05/20/2008LH                               DISCHARGE SUMMARY   ADMISSION DIAGNOSES:  1. Bilateral severe lower extremity edema of unclear etiology  2. Morbid obesity  3. History of hypertension  4. Hypothyroidism  5. Chronic pain syndrome  6. Depression  7. Arthritis   DISCHARGE DIAGNOSES:  1. Bilateral severe lower extremity edema  2. Right-sided heart failure  3. Morbid obesity  4. Hypothyroidism  5. Hypertension  6. Chronic pain syndrome   HISTORY OF PRESENT ILLNESS:  The patient is a 70 year old Caucasian  female who was seen at her doctor's office with worsening complaints of  lower leg edema.  According to the patient, the leg swelling started a  couple of days ago, but it progressed; more so, she had noted that she  had had a history of this for about 2-3 months.  Also, she stated that  she had gained over 60 pounds over the last year.  After being seen in  her doctor's office, who is Dr. Moshe Cipro, it was determined that the  patient should be admitted for IV diuresis.   PROCEDURE:  Chest x-ray.   HOSPITAL COURSE:  The patient was given IV diuretics.  She was also put  on DVT prophylaxis.  She was put on a heart healthy diet.  She was  started on an IV.  We checked her TSH with her hypothyroidism; also, an  EKG was done.  It was shown that her TSH hormone was not therapeutic.  Her thyroid medication was then increased.  The patient was kept on  Zofran and put on hand-held nebs p.r.n.  The patient's legs continued to  decrease in swelling.  At that point in time, it was determined that the  patient could go ahead and be discharged home.   CONDITION ON DISCHARGE:  Good,  stable condition.   DISPOSITION:  We will discharge her to home.   MEDICATIONS:  We will put her back on her home medications that have  been reconciled.  Which include  Seroquel 300mg  qhs  Prevacid 30mg  po qd  Methadone hcl 10 mg q 4 hours  Synthroid 175 mcg 1 and a half M-F adn 1 on S/s  Vytorin 10/20mg  1 qhs  Furosemide 20 mg bid  Miralax on cap with 8 oz of h20  amitiza 24 mcg bid oscal d 500mg  tid   DISCHARGE INSTRUCTIONS:  She will follow up with Dr. Moshe Cipro within 1-2  days regarding her hospitalization.  If patient's symptoms worsen, she  has been instructed to return to the emergency room for evaluation.      [name], DO  Electronically Signed     CB/MEDQ  D:  12/25/2006  T:  12/25/2006  Job:  [id number]

## 2010-12-23 NOTE — Op Note (Signed)
NAME:  Katelyn Rowland, Katelyn Rowland                          ACCOUNT NO.:  [id number]   MEDICAL RECORD NO.:  [id number]                   PATIENT TYPE:  AMB   LOCATION:  DAY                                  FACILITY:  [facility]   PHYSICIAN:  Hildred Laser, M.D.                 DATE OF BIRTH:  Jul 29, 1941   DATE OF PROCEDURE:  06/16/2003  DATE OF DISCHARGE:                                 OPERATIVE REPORT   PROCEDURE:  Esophagogastroduodenoscopy with esophageal dilatation followed  by total colonoscopy.   INDICATIONS FOR PROCEDURE:  Katelyn Rowland is a 70 year old Caucasian female with  history of GERD who presents with solid food dysphagia.  She is also  undergoing screening colonoscopy.  The procedure risks were reviewed with  the patient, and informed consent was obtained.   PREOPERATIVE MEDICATIONS:  Cetacaine spray for pharyngeal topical  anesthesia, Demerol 50 mg IV, Versed 10 mg IV in divided doses.   FINDINGS:  The procedure was performed in the endoscopy suite.  The  patient's vital signs and O2 saturations were monitored during the procedure  and remained stable.   PROCEDURE #1, ESOPHAGOGASTRODUODENOSCOPY:  The patient was placed in the  left lateral recumbent position, and the Olympus videoscope was passed via  the oropharynx without any difficulty into the esophagus.  Her oropharyngeal  mucosa was noted to be very dry.   Esophagus:  The mucosa of the esophagus was normal throughout.  There was a  single erosion at the GE junction and a small hernia, but there was no ring  or stricture.   Stomach:  It had a scant amount of food debris.  It distended very well with  insufflation.  The folds of the proximal stomach were normal.  Examination  of  the mucosa revealed an antral erosion but there was an ulcer crater.  The pyloric channel was patent.  The angularis, fundus, and cardia were  examined by retroflexing the scope and were normal.   Duodenum:  Examination of the bulb and second part of  the duodenum was  normal.   The endoscope was withdrawn.  The esophagus was dilated by passing a 56  [place] Maloney dilator to full insertion.  As the dilator was withdrawn, the  endoscope was passed again, and there was no mucosal injury noted to the  esophagus.  The endoscope was withdrawn, and the patient was prepared for  procedure #2.   PROCEDURE #2, TOTAL COLONOSCOPY:  Rectal examination was performed.  No  abnormality noted on external or digital exam.  The Olympus videoscope was  placed into the rectum and advanced into the region of the sigmoid colon and  beyond.  The preparation was felt to be fair.  She had a lot of thick liquid  stool in her colon.  Most of this was suctioned out.  The scope was passed  to the cecum which was identified by the appendiceal  orifice and ileocecal  valve.  Pictures were taken for the record.  As the scope was withdrawn, the  colonic mucosa was carefully examined.  There were no diverticular polyps or  tumor masses.  The rectal mucosa was normal.  The scope was retroflexed to  examine the anorectal junction which was unremarkable.  The endoscope was  straightened and withdrawn.  The patient tolerated the procedure well.   FINAL DIAGNOSES:  1. Dry oropharyngeal mucosa which may be contributing to her dysphagia.  2. Small sliding hiatal hernia with a single erosion at the gastroesophageal     junction but no evidence of ring or stricture formation.  3. Esophagus dilated given history of dysphagia by passing a 56 [place]     Maloney dilator.  4. Erosive antral gastritis.  5. Normal colonoscopy.  The preparation was somewhat suboptimal.   RECOMMENDATIONS:  1. She will continue antireflux measures and PPI as before.  2. I would ask Dr. Moshe Cipro if there is any room for decrease in some of her     medications which are causing her dry mouth.  3. H pylori serology will be checked today.      ___________________________________________                                             Hildred Laser, M.D.   NR/MEDQ  D:  06/16/2003  T:  06/16/2003  Job:  [id number]   cc:   Moshe Cipro, M.D.   Gaston Islam. Neijstrom, MD  [address]  [place]  [place] [address]  Fax: [phone]

## 2010-12-23 NOTE — Discharge Summary (Signed)
NAME:  Katelyn Rowland, Katelyn Rowland                          ACCOUNT NO.:  [id number]   MEDICAL RECORD NO.:  [id number]                   PATIENT TYPE:  INP   LOCATION:  A302                                 FACILITY:  [facility]   PHYSICIAN:  Karlyn Agee, M.D.              DATE OF BIRTH:  1941/01/06   DATE OF ADMISSION:  02/15/2004  DATE OF DISCHARGE:  02/20/2004                                 DISCHARGE SUMMARY   PRIMARY CARE PHYSICIAN:  Norwood Levo. Moshe Cipro, M.D.   DISCHARGE DIAGNOSES:  1. Left lower extremity deep vein thrombosis.  2. Chronic sciatica.  3. Chronic back pain.  4. Hypertension.  5. Hyperlipidemia.  6. Major depression.  7. Insomnia.  8. Hyperlipidemia.  9. Impaired mobility secondary to one, two, and three.  10.      Acute renal failure, resolved.   DISPOSITION:  Discharged to home.   DISCHARGE CONDITION:  Stable.   DISCHARGE MEDICATIONS:  1. Coumadin 7.5 mg each evening.  2. Topamax 100 mg at bedtime.  3. Xanax 1 mg four times daily, 2 mg at bedtime.  4. Methadone 5 mg one tablet three times a day and two tablets at bedtime.  5. Synthroid 150 mcg daily.  6. Tiazac 360 mg daily.  7. Lotensin 10 mg daily.  8. Prozac 40 mg daily.  9. Lipitor 20 mg each evening.  10.      Geodon 60 mg two at bedtime.  11.      Nexium 40 mg daily.   HOSPITAL COURSE:  1. Please refer to history and physical of February 15, 2004.  This is an     elderly Caucasian lady with multiple medical problems including sciatica     and depression who awoke one morning about four days prior to admission     to find sudden swelling of the left lower extremity.  Her primary care     physician advised her to get a lower extremity doppler done.  This     revealed a DVT in the veins of the left thigh, and the patient was     admitted from the radiology department for anticoagulation.  After     admission, the patient's blood work revealed acute renal failure with a     creatinine of 3.4.  The patient was  given adjusted dose Lovenox and     started on oral Coumadin.  The patient was hydrated.  2. The patient's acute renal failure resolved on the fifth hospital day with     her creatinine falling to 1.4 and hydration was discontinued.  Her INR     rose very slowly.  Yesterday her INR was 1.6 and the insurance company     was apparently calling the nurses on the ward and also calling the social     workers requesting that the patient be discharged home to receive Lovenox     and Coumadin  and INR monitoring at home.  However, we were unable to     locate anyone, a physician, to monitor the patient on an outpatient     basis, and we decided to keep the patient for safety reasons until her     INR was therapeutic, above 2.  This morning the patient's INR was 2.1,     and her creatinine was 1.3.  She continues to have significant pain and     swelling in the left lower extremity despite physical therapy.  However,     she is being discharged with home health and home physical therapy with     home health to check her PT INR on Monday and to call it to her primary     care physician, Dr. Moshe Cipro.  3. The patient states she does not have any medications to cover her for the     weekend.  Arrangements have been made by the case manager on call to get     her a two day supply of medications, and then she can get her refills on     Monday.  Apparently, she gets her medications from a particular outlet.   FOLLOWUP:  1. Follow up Dr. Tula Nakayama, primary care physician, who is to follow     the patient's INR.  Her level is to be kept between 2-3.  Her Coumadin     may at a later date need downward adjustment.  2. The patient is to have home PT and OT for the leg.  May at some time need     compression stockings.  3. The patient is to follow up with her psychiatrist for titration of her     meds.  Meds were initially adjusted because of her renal failure, but she     is now back on her baseline  dose.  She was prescribed to take three     tablets of Geodon at night, but she says she only takes two, so we kept     her on two here, although the patient seems to be continuously drowsy,     and she complains continuously of a dry mouth.  However, we will leave     that to her psychiatrist to make those adjustments.   SPECIAL INSTRUCTIONS:  1. The patient is advised to get her INR checked on Monday.  2. The patient is advised to stop smoking.  She was using a nicotine patch     while here.  She will be going out without a nicotine patch.  She has     been advised of the association between nicotine abuse and deep vein     thrombosis as well as other problems associated with smoking.  3. The patient has been advised to see her doctor or come to the emergency     room if she has any evidence of bleeding.  4. Her primary care physician will arrange followup with psychiatrist.     ___________________________________________                                         Karlyn Agee, M.D.   LC/MEDQ  D:  02/20/2004  T:  02/20/2004  Job:  [id number]

## 2010-12-23 NOTE — H&P (Signed)
NAME:  Katelyn Rowland, Katelyn Rowland                          ACCOUNT NO.:  [id number]   MEDICAL RECORD NO.:  [id number]                   PATIENT TYPE:  INP   LOCATION:  A302                                 FACILITY:  [facility]   PHYSICIAN:  Karlyn Agee, M.D.              DATE OF BIRTH:  1940-08-18   DATE OF ADMISSION:  02/15/2004  DATE OF DISCHARGE:                                HISTORY & PHYSICAL   PRIMARY CARE PHYSICIAN:  Norwood Levo. Moshe Cipro, M.D.   CHIEF COMPLAINT:  Pain and swelling of the left leg x4 days.   HISTORY OF PRESENT ILLNESS:  This is a 70 year old, Caucasian lady with a  history of severe depression, chronic back pain and sciatica with impaired  mobility, hypothyroidism and hypertension who was in her usual state of  health until she awoke Thursday morning noticing extensive swelling and pain  of the left leg and thigh which had not been present on the night of going  to bed.  The patient tried to self-medicate herself with ibuprofen and  drinking lots of fluids thinking that would relieve the problems, but when  she got no relief she eventually called her doctor this morning who arranged  for her to get Doppler ultrasound of the left lower extremity which revealed  DVT.  The patient is admitted directly for management for acute left lower  extremity DVT.   The patient says she spends most of her time standing because she feels less  pain that way.  She stands to do a lot of gardening.  She has had no history  of prolonged travel, no history of prolonged sitting, no history of  prolonged confinement to bed.  She has no family history of clots and she  has no past history of clots or DVTs.  Although she has a strong family  history of cancers, she has had no weight loss other than related to her  depression and she is, in fact, gaining weight having lost weight after the  recent death of her granddaughter.  She has no nausea, vomiting, diarrhea,  bloating, constipation or bloody  stool.  She gets checkups regularly.   PAST MEDICAL HISTORY:  1. Chronic sciatica.  2. Chronic back pain.  3. Hypertension.  4. Hyperlipidemia.  5. Depression.  6. Insomnia.  7. Hyperlipidemia.   MEDICATIONS:  1. Topamax 100 mg at bedtime.  2. Xanax 1 mg four times a day and 2 mg at bedtime.  3. Methadone 5 mg five times a day when necessary.  4. Synthroid 150 mcg daily.  5. Diltiazem 260 mg daily.  6. Lotensin 10/12.5 daily.  7. Prozac 40 mg daily.  8. Lipitor 20 mg each evening.  9. Geodon 60 mg two at bedtime.  10.      Nexium 40 mg daily.   ALLERGIES:  TYLENOL causes muscle twitching.  STEROIDS cause her blood to  go on fire.  SOCIAL HISTORY:  She is married.  She has lost her daughter and son to  cancer.  In December 2004, she lost a granddaughter to motor vehicle  accident.  She smoked regularly for about 19 years since age 37.  Quit x10  years and then restarted for 2-3 years now.  She has never had alcohol or  abused illicit drugs.   FAMILY HISTORY:  Both parents died of CHF.  She had one sister who died of  an MI in her 58s.  She has a brother who is in a nursing home with kidney  problems.  Her husband has just been treated for colon cancer.   REVIEW OF SYMPTOMS:  NEUROLOGIC:  She use to suffer with chronic headaches,  but they were eliminated by anxiolytics.  She no longer has headaches.  HEENT:  She denies eye or throat problems.  She has been suffering with  chronic dryness of the mouth associated with dysphagia.  ENDOCRINE:  She has  thyroid problems and has been repeatedly tested for osteoporosis and has no  evidence of osteoporosis.  RESPIRATORY:  She denies fever, coughs or colds.  CARDIAC:  No chest pain or shortness of breath.  MUSCULOSKELETAL:  Her  activity is limited because of her severe sciatica.  She walks very slowly  and does not go very far.  She is somewhat active in her garden.  She does  have chronic back pain and sciatica, but no other  joint problems.  GASTROINTESTINAL:  She denies nausea, vomiting, diarrhea, constipation or  bloody stools.  GU:  She denies any GU problems.   PHYSICAL EXAMINATION:  GENERAL:  This is a very pleasant, elderly, Caucasian  lady lying in bed.  She is in no acute respiratory distress, but she does  become tearful when she speaks of her family issues sometimes.  VITAL SIGNS:  Temperature 97.4, pulse 69, respirations 20, blood pressure  102/70.  Weight 182.5 pounds.  HEENT:  Pupils equal round and reactive to light.  Her mucous membranes are  pink.  She is edentulous with upper and lower dentures.  NECK:  She has no thyromegaly or lymphadenopathy.  CHEST:  Clear to auscultation bilaterally.  CARDIAC:  Regular rhythm without murmur.  ABDOMEN:  Obese, soft and nontender.  She has two surgical scars related to  remote hysterectomy, oophorectomy and appendectomy.  EXTREMITIES:  She has 3+ edema with redness of the left lower extremity.  Leg is also tender.  The right leg is without edema and she has 3+ pulses.  NEUROLOGIC:  She is alert and oriented x3.  Cranial nerves 2-12 grossly  intact.  There is no focal deficit given the limitation of the exam due to  the left lower extremity painful swelling.   LABORATORY DATA AND X-RAY FINDINGS:  Doppler of the left lower extremity  shows a thrombus in the left common femoral vein, the superficial femoral  vein, the popliteal vein, the saphenous vein and the profundus vein.   ASSESSMENT:  1. Acute left lower extremity deep venous thrombosis with no clear     precipitating factors.  2. Hypertension, controlled.  3. History of hyperthyroidism.  4. Severe depression.  5. Insomnia.  6. Chronic pain syndrome.   PLAN:  1. Will admit this lady and start her on Lovenox.  2. Will get some baseline blood work to be sure she has no renal impairment. 3. Will start her on Coumadin 7.5 mg this evening and then 5 mg daily.  Watch INR daily.  4. Monitor for  bleeding.  5. Will do blood work and check her thyroid function is at baseline.  6. Will continue all her psychotropic medications, in particular, methadone     will be continued since it is recommended from the pain clinic.     ___________________________________________                                         Karlyn Agee, M.D.   LC/MEDQ  D:  02/15/2004  T:  02/15/2004  Job:  [id number]   cc:   Norwood Levo. Moshe Cipro, M.D.  [address]  [place], [place] [address]  Fax: [phone]

## 2010-12-23 NOTE — Consult Note (Signed)
NAME:  Katelyn Rowland, Katelyn Rowland                          ACCOUNT NO.:  [id number]   MEDICAL RECORD NO.:  [id number]                  PATIENT TYPE:   LOCATION:                                       FACILITY:   PHYSICIAN:  Hildred Laser, M.D.                 DATE OF BIRTH:  06-09-1941   DATE OF CONSULTATION:  06/02/2003  DATE OF DISCHARGE:                                   CONSULTATION   REASON FOR CONSULTATION:  Solid food dysphagia. The patient is also  interested in undergoing colonoscopy.   HISTORY OF PRESENT ILLNESS:  Katelyn Rowland is a 70 year old Caucasian female  who is referred through courtesy of Dr. Tula Nakayama for evaluation of  dysphagia, which she has had for well over a year but is gradually getting  worse and primarily experienced with solids and particularly, meats. She  feels as if food bolus gets stuck, level of upper sternum. She eventually  passes down spontaneously. Lately, she is experiencing more and more  frequently. She has history of heartburn but this has been well controlled  with acid suppression. She previously was on Prevacid. After a while, it  quit working and now she is on Nexium. She denies chronic cough, hoarseness,  chest pain, or odynophagia. She states she has a very good appetite. She has  not lost any weight recently. Her bowels move daily but she is passing  ribbon-like stools. She denies melena or rectal bleeding. She has been  advised to undergo colonoscopy for evaluation. 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 She has concerns about various  preparations but she seemed to understand the reasons.   REVIEW OF SYSTEMS:  Positive for chronic back pain.   MEDICATIONS:  1. Prozac 40 mg daily.  2. Neurontin 600 mg q.h.s.  3. Tiazac 360 mg daily.  4. Geodon 80 mg q.h.s.  5. Methadone 5 mg t.i.d.  6. Bextra 20 mg t.i.d.  7. Synthroid 300 mcg daily.  8. Lotensin 10/12.5 2 tablets daily.  9. Xanax 1 mg t.i.d.  10.       Ambien 20 mg q.h.s.  11.      Nexium 40 mg daily.  12.      Topamax 100 mg q.h.s.   She has been on Bextra for about 8 months. Has not experienced any untoward  effects.   PAST MEDICAL HISTORY:  Medical problems include depression, hypertension and  hypothyroidism. She has had depression for several years. Hypertensive 12  and has had thyroid disease for about 20 years. She also has chronic GERD  symptoms, well controlled with therapy. 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 She has had 19 injections to kill nerves but this did  not make a big difference.  She tells me that she was recently noted to have  low platelet count. However, she was  evaluated by Dr. Tressie Stalker and no  abnormality was found and her platelet count turned out to be normal.   PAST SURGICAL HISTORY:  Tonsillectomy and adenoidectomy. Decompression of  right carpal tunnel. Surgery for sinus problems. 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 She has had appendectomy, hysterectomy, left oophorectomy  and she still has her right ovary. Most recently, she had neck surgery with  placement of metal plate about 4 years ago.   ALLERGIES:  STEROIDS.   FAMILY HISTORY:  Both parents died of congestive heart failure. She has 9  siblings and they are generally, in fair to good health.   SOCIAL HISTORY:  She is married. She has a daughter and son in good health.  She lost a daughter of metastatic carcinoma at age 45 and she lost a son of  osteosarcoma at age 80. She is a housewife. She smoked cigarettes for 36  years. Quit for 10 and now, has gone back to smoking about 3 years ago. She  has never drank alcohol.   PHYSICAL EXAMINATION:  GENERAL:  A pleasant,mildly obese Caucasian female  who is in no acute distress.  VITAL SIGNS:  She weighs 185 pounds. She is 5 feet 2 and 1/4 inch tall.  Pulse 70 per minute. Blood pressure 120/60. Temperature is 97.5.  HEENT:  Conjunctiva is pink. Sclerae is nonicteric.  Oropharyngeal mucosa is  normal. She has well fitting upper and lower dentures in place.  NECK:  Without masses or thyromegaly.  CARDIAC:  Regular rhythm. Normal S1 and S3. No murmur or gallop noted.  LUNGS:  Clear to auscultation.  ABDOMEN:  Protuberant, soft and nontender without organomegaly or masses.  RECTAL:  Examination deferred.  EXTREMITIES:  She does not have clubbing or peripheral edema.   ASSESSMENT:  Katelyn Rowland is a 70 year old Caucasian female with multiple  medical problems with chronic gastroesophageal reflux disease with  satisfactory control of her heartburn who presents with solid food  dysphagia, which has been gradually progressive. I suspect she has  __________ but she could also have peptic stricture of the esophagus.  Change in her bowel habits with ribbon-like stools. I suspect that she has  irritable bowel syndrome. She definitely needs to undergo colonoscopy,  primarily for screening purposes.   RECOMMENDATIONS:  She will continue anti-reflux measures as before. She will  undergo an esophagogastroduodenoscopy and possible ED followed by  colonoscopy in the near future at [facility]. I have reviewed both  procedures and issues of conscious sedation as well as the risks and she is  agreeable.   I would like to thank Dr. Moshe Cipro for the opportunity to participate in the  care of this nice lady.      ___________________________________________                                            Hildred Laser, M.D.   NR/MEDQ  D:  06/02/2003  T:  06/03/2003  Job:  [id number]   cc:   Norwood Levo. Moshe Cipro, M.D.  [address]  [place], [place] [address]  Fax: [phone]

## 2010-12-30 ENCOUNTER — Other Ambulatory Visit: Payer: Self-pay

## 2010-12-30 DIAGNOSIS — M549 Dorsalgia, unspecified: Secondary | ICD-10-CM

## 2010-12-30 MED ORDER — METHADONE HCL 10 MG PO TABS: 10.0000 mg | ORAL_TABLET | Freq: Three times a day (TID) | ORAL | Status: DC

## 2011-01-27 ENCOUNTER — Other Ambulatory Visit: Payer: Self-pay

## 2011-01-27 ENCOUNTER — Telehealth: Payer: Self-pay | Admitting: Family Medicine

## 2011-01-27 DIAGNOSIS — M549 Dorsalgia, unspecified: Secondary | ICD-10-CM

## 2011-01-27 MED ORDER — PREDNISONE 5 MG PO KIT: 1.0000 | PACK | ORAL | Status: DC

## 2011-01-27 MED ORDER — METHADONE HCL 10 MG PO TABS: 10.0000 mg | ORAL_TABLET | Freq: Three times a day (TID) | ORAL | Status: DC

## 2011-01-27 NOTE — Telephone Encounter (Signed)
Med sent, called patient, left message

## 2011-01-27 NOTE — Telephone Encounter (Signed)
Patient aware.

## 2011-01-27 NOTE — Telephone Encounter (Signed)
pls advise and erx prednisone 5mg  dose pack x 1, also tell her to stay away from the plant  Please and we totally enjoyed her writing on the birds

## 2011-02-24 ENCOUNTER — Other Ambulatory Visit: Payer: Self-pay

## 2011-02-24 DIAGNOSIS — M549 Dorsalgia, unspecified: Secondary | ICD-10-CM

## 2011-02-24 MED ORDER — METHADONE HCL 10 MG PO TABS: 10.0000 mg | ORAL_TABLET | Freq: Three times a day (TID) | ORAL | Status: DC

## 2011-02-28 ENCOUNTER — Encounter: Payer: Self-pay | Admitting: Family Medicine

## 2011-03-02 ENCOUNTER — Telehealth: Payer: Self-pay | Admitting: Family Medicine

## 2011-03-02 ENCOUNTER — Other Ambulatory Visit: Payer: Self-pay | Admitting: Family Medicine

## 2011-03-02 DIAGNOSIS — L237 Allergic contact dermatitis due to plants, except food: Secondary | ICD-10-CM

## 2011-03-02 MED ORDER — PREDNISONE (PAK) 5 MG PO TABS: 5.0000 mg | ORAL_TABLET | ORAL | Status: DC

## 2011-03-02 NOTE — Telephone Encounter (Signed)
Spoke with pt dose pack ordered

## 2011-03-03 ENCOUNTER — Encounter: Payer: Self-pay | Admitting: Family Medicine

## 2011-03-06 ENCOUNTER — Ambulatory Visit: Payer: Medicare Other | Admitting: Family Medicine

## 2011-03-10 ENCOUNTER — Encounter: Payer: Self-pay | Admitting: Family Medicine

## 2011-03-13 ENCOUNTER — Ambulatory Visit (INDEPENDENT_AMBULATORY_CARE_PROVIDER_SITE_OTHER): Payer: Medicare Other | Admitting: Family Medicine

## 2011-03-13 ENCOUNTER — Encounter: Payer: Self-pay | Admitting: Family Medicine

## 2011-03-13 VITALS — BP 160/82 | HR 73 | Resp 16 | Ht 63.0 in | Wt 187.4 lb

## 2011-03-13 DIAGNOSIS — E785 Hyperlipidemia, unspecified: Secondary | ICD-10-CM

## 2011-03-13 DIAGNOSIS — F411 Generalized anxiety disorder: Secondary | ICD-10-CM

## 2011-03-13 DIAGNOSIS — E119 Type 2 diabetes mellitus without complications: Secondary | ICD-10-CM

## 2011-03-13 DIAGNOSIS — I10 Essential (primary) hypertension: Secondary | ICD-10-CM

## 2011-03-13 DIAGNOSIS — M159 Polyosteoarthritis, unspecified: Secondary | ICD-10-CM

## 2011-03-13 DIAGNOSIS — E039 Hypothyroidism, unspecified: Secondary | ICD-10-CM

## 2011-03-13 DIAGNOSIS — E669 Obesity, unspecified: Secondary | ICD-10-CM

## 2011-03-13 NOTE — Assessment & Plan Note (Signed)
Improved, pt less symptomatic

## 2011-03-13 NOTE — Assessment & Plan Note (Signed)
Low fat diet discussed and encouraged 

## 2011-03-13 NOTE — Assessment & Plan Note (Signed)
Uncontrolled. Medication compliance addressed. Commitment to regular exercise, and healthy  eating habits with portion control discussed. DASH diet, and low fat diet discussed, and literature offered. No changes in medication at this time.  

## 2011-03-13 NOTE — Patient Instructions (Addendum)
F/u in 3 months.  I am happy that you continue to do well   Since you do not want to be treated if you have cancer, I will not schedule a mammogram per your request.  Please also dsicuss this with your family so they know your wishes. since you do not want artificial support in the event that your heart and lungs stop you need to get a living will completed. pls discuss this with your family , so they know.  Labs need to be done as soon as possible   Pls ask your daughter to accompany you to your next visit, so we can discuss your health and the decisions which you are making to have no cancer screening or testing done as well as a living will

## 2011-03-13 NOTE — Assessment & Plan Note (Signed)
Labs are past due and need to be done , pt aware

## 2011-03-13 NOTE — Assessment & Plan Note (Signed)
Lab data needs to be updated , pt aware

## 2011-03-13 NOTE — Assessment & Plan Note (Signed)
Unchanged. Patient re-educated about  the importance of commitment to a  minimum of 150 minutes of exercise per week. The importance of healthy food choices with portion control discussed. Encouraged to start a food diary, count calories and to consider  joining a support group. Sample diet sheets offered. Goals set by the patient for the next several months.    

## 2011-03-13 NOTE — Progress Notes (Signed)
  Subjective:    Patient ID: Katelyn Rowland, female    DOB: 01/26/1941, 70 y.o.   MRN: [id number]  HPI The PT is here for follow up and re-evaluation of chronic medical conditions, medication management and review of any available recent lab and radiology data.  Preventive health is updated, specifically  Cancer screening and Immunization. Pt is refusing screening , since she now states she wants no intervention for cancer.  Questions or concerns regarding consultations or procedures which the PT has had in the interim are  addressed. The PT denies any adverse reactions to current medications since the last visit.  There are no new concerns.  There are no specific complaints . States she feels very well, and is happy to live, enjoying life immensely, and if she were to die , this too she is ready for. She no longer smokes, reports using less pain meds than ion the pas and is no longer taking xanax. Reports increased involvement in [employer] activities. Also states she does not want life support Blood sugars when tested are       Review of Systems Denies recent fever or chills. Denies sinus pressure, nasal congestion, ear pain or sore throat. Denies chest congestion, productive cough or wheezing. Denies chest pains, palpitations and leg swelling Denies abdominal pain, nausea, vomiting,diarrhea or constipation.   Denies dysuria, frequency, hesitancy or incontinence.  Denies headaches, seizures, numbness, or tingling. Denies depression,uncontrolled  anxiety or insomnia. Denies skin break down or rash.        Objective:   Physical Exam Patient alert and oriented and in no cardiopulmonary distress.  HEENT: No facial asymmetry, EOMI, no sinus tenderness,  oropharynx pink and moist.  Neck supple no adenopathy.  Chest: Clear to auscultation bilaterally.  CVS: S1, S2 no murmurs, no S3.  ABD: Soft non tender. Bowel sounds normal.  Ext: No edema  MS: decreased  ROM spine,  shoulders, hips and knees.  Skin: Intact, no ulcerations or rash noted.  Psych: Good eye contact, normal affect. Memory intact not anxious or depressed appearing.  CNS: CN 2-12 intact, power, tone and sensation normal throughout.        Assessment & Plan:

## 2011-03-13 NOTE — Assessment & Plan Note (Signed)
Improved

## 2011-03-17 ENCOUNTER — Telehealth: Payer: Self-pay | Admitting: Family Medicine

## 2011-03-17 DIAGNOSIS — L237 Allergic contact dermatitis due to plants, except food: Secondary | ICD-10-CM

## 2011-03-17 MED ORDER — PREDNISONE (PAK) 5 MG PO TABS: 5.0000 mg | ORAL_TABLET | ORAL | Status: DC

## 2011-03-17 NOTE — Telephone Encounter (Signed)
Should this patient come in for injection or can something be sent in??

## 2011-03-17 NOTE — Telephone Encounter (Signed)
If her eye is affected needs to see opthal;mologist, let her know, I am not sending in prednisone for that, doctor's vision or dr Iona Hansen in [place] are generally available if she agrees send a msg to referral staff to call for appt today pls ,

## 2011-03-17 NOTE — Telephone Encounter (Signed)
I am really not clear as to why I keep getting this call and she stated the bush was pyuleed up. Let her know that this is the last time she will get the prednisone script without an office eval, and she absolutely needs to stay out of the garden. Pls refill dose pack x 1 only

## 2011-03-17 NOTE — Telephone Encounter (Signed)
Patient states it is all over her face and neck, not in her eye, its around her eye

## 2011-03-17 NOTE — Telephone Encounter (Signed)
Patient aware, med sent

## 2011-03-24 ENCOUNTER — Other Ambulatory Visit: Payer: Self-pay | Admitting: Family Medicine

## 2011-03-28 ENCOUNTER — Other Ambulatory Visit: Payer: Self-pay | Admitting: Family Medicine

## 2011-03-29 ENCOUNTER — Other Ambulatory Visit: Payer: Self-pay | Admitting: Family Medicine

## 2011-03-29 DIAGNOSIS — G4700 Insomnia, unspecified: Secondary | ICD-10-CM

## 2011-03-29 MED ORDER — TRAZODONE HCL 100 MG PO TABS: 100.0000 mg | ORAL_TABLET | Freq: Every day | ORAL | Status: DC

## 2011-03-29 NOTE — Telephone Encounter (Signed)
Refill sent as requested. 

## 2011-03-31 ENCOUNTER — Other Ambulatory Visit: Payer: Self-pay

## 2011-03-31 DIAGNOSIS — M549 Dorsalgia, unspecified: Secondary | ICD-10-CM

## 2011-03-31 MED ORDER — METHADONE HCL 10 MG PO TABS: 10.0000 mg | ORAL_TABLET | Freq: Three times a day (TID) | ORAL | Status: DC

## 2011-04-01 LAB — LIPID PANEL
Cholesterol: 125 mg/dL (ref 0–200)
Total CHOL/HDL Ratio: 2.1 Ratio
Triglycerides: 83 mg/dL (ref <150)
VLDL: 17 mg/dL (ref 0–40)

## 2011-04-01 LAB — BASIC METABOLIC PANEL
CO2: 27 mEq/L (ref 19–32)
Chloride: 103 mEq/L (ref 96–112)
Creat: 0.85 mg/dL (ref 0.50–1.10)
Sodium: 140 mEq/L (ref 135–145)

## 2011-04-01 LAB — HEPATIC FUNCTION PANEL
ALT: 15 U/L (ref 0–35)
Albumin: 4 g/dL (ref 3.5–5.2)
Total Protein: 6.9 g/dL (ref 6.0–8.3)

## 2011-04-24 ENCOUNTER — Ambulatory Visit: Payer: Medicare Other | Admitting: Family Medicine

## 2011-04-28 ENCOUNTER — Other Ambulatory Visit: Payer: Self-pay

## 2011-04-28 DIAGNOSIS — M549 Dorsalgia, unspecified: Secondary | ICD-10-CM

## 2011-04-28 MED ORDER — METHADONE HCL 10 MG PO TABS: 10.0000 mg | ORAL_TABLET | Freq: Three times a day (TID) | ORAL | Status: DC

## 2011-05-18 ENCOUNTER — Encounter: Payer: Self-pay | Admitting: Family Medicine

## 2011-05-22 ENCOUNTER — Ambulatory Visit (INDEPENDENT_AMBULATORY_CARE_PROVIDER_SITE_OTHER): Payer: Medicare Other | Admitting: Family Medicine

## 2011-05-22 ENCOUNTER — Encounter: Payer: Self-pay | Admitting: Family Medicine

## 2011-05-22 VITALS — BP 132/70 | HR 62 | Resp 16 | Wt 192.0 lb

## 2011-05-22 DIAGNOSIS — E785 Hyperlipidemia, unspecified: Secondary | ICD-10-CM

## 2011-05-22 DIAGNOSIS — M1990 Unspecified osteoarthritis, unspecified site: Secondary | ICD-10-CM

## 2011-05-22 DIAGNOSIS — E039 Hypothyroidism, unspecified: Secondary | ICD-10-CM

## 2011-05-22 DIAGNOSIS — I10 Essential (primary) hypertension: Secondary | ICD-10-CM

## 2011-05-22 DIAGNOSIS — Z66 Do not resuscitate: Secondary | ICD-10-CM

## 2011-05-22 DIAGNOSIS — E119 Type 2 diabetes mellitus without complications: Secondary | ICD-10-CM

## 2011-05-22 MED ORDER — EZETIMIBE-SIMVASTATIN 10-40 MG PO TABS: 1.0000 | ORAL_TABLET | Freq: Every day | ORAL | Status: DC

## 2011-05-22 MED ORDER — ESOMEPRAZOLE MAGNESIUM 40 MG PO CPDR: 40.0000 mg | DELAYED_RELEASE_CAPSULE | Freq: Every day | ORAL | Status: DC

## 2011-05-22 NOTE — Patient Instructions (Signed)
F/u mid December .  Non fasting TSH, Hba1C and chem 7 in Dec LABWORK  NEEDS TO BE DONE BETWEEN 3 TO 7 DAYS BEFORE YOUR NEXT SCEDULED  VISIT.  THIS WILL IMPROVE THE QUALITY OF YOUR CARE.  You will leave with a signed DNR per your expressed wishes

## 2011-05-22 NOTE — Progress Notes (Signed)
  Subjective:    Patient ID: Katelyn Rowland, female    DOB: 1940/10/11, 70 y.o.   MRN: [id number]  HPI Pt in with her grabdson to establish the fact that he is her  health care power of attorney, she has documents notarized to support this. She also wants to complete a DNR form, stating in her right mind that in the event of cardiac or respiratory arrest she does not want to be resucitated. This statement does not preclude provision of medical care and support. This was discussed in the presence of her grandson, and witnessed also by a staff nurse, Katelyn Rowland. The pt has said this many times in the past but there has been no documentation previously in the form of a formal form which she has been given to take to her home. She reports that she has been doing well, and apart from her chronic back pain and recent flares with poison oak, has no complaints. Katelyn Rowland has consistently for at least 2 years refused all cancer screening tests, and maintains that this is what she wants to do.   Review of Systems See HPI Denies recent fever or chills. Denies sinus pressure, nasal congestion, ear pain or sore throat. Denies chest congestion, productive cough or wheezing. Denies chest pains, palpitations and leg swelling Denies abdominal pain, nausea, vomiting,diarrhea or constipation.   Denies dysuria, frequency, hesitancy or incontinence.  Denies headaches, seizures, numbness, or tingling. Denies depression, anxiety or insomnia. Denies skin break down or rash.        Objective:   Physical Exam Patient alert and oriented and in no cardiopulmonary distress.  HEENT: No facial asymmetry, EOMI, no sinus tenderness,  oropharynx pink and moist.  Neck adequate ROM,no adenopathy.  Chest: Clear to auscultation bilaterally.  CVS: S1, S2 no murmurs, no S3.  ABD: Soft non tender. Bowel sounds normal.  Ext: No edema  [id number] ROM spine,adequate in  shoulders, hips and knees.  Skin:  Intact, no ulcerations or rash noted.  Psych: Good eye contact, normal affect. Memory intact not anxious or depressed appearing.  CNS: CN 2-12 intact, power, tone and sensation normal throughout.        Assessment & Plan:

## 2011-05-26 ENCOUNTER — Other Ambulatory Visit: Payer: Self-pay

## 2011-05-26 DIAGNOSIS — M549 Dorsalgia, unspecified: Secondary | ICD-10-CM

## 2011-05-26 MED ORDER — METHADONE HCL 10 MG PO TABS: 10.0000 mg | ORAL_TABLET | Freq: Three times a day (TID) | ORAL | Status: DC

## 2011-05-29 DIAGNOSIS — Z66 Do not resuscitate: Secondary | ICD-10-CM | POA: Insufficient documentation

## 2011-05-29 NOTE — Assessment & Plan Note (Signed)
Diet controlled only, which is excellent

## 2011-05-29 NOTE — Assessment & Plan Note (Signed)
Controlled, no change in medication  

## 2011-05-29 NOTE — Assessment & Plan Note (Signed)
Controlled, no change in medication Lw fat diet encouraged an discussed

## 2011-05-29 NOTE — Assessment & Plan Note (Signed)
In sound mind, in the presence of her health care power of attorney, as well as a staff LPN, pt clearly stated that she receive no cardiac or pulmonary resucitation in the event of arrest. She was provided with a form to keep at home which I signed

## 2011-06-26 ENCOUNTER — Other Ambulatory Visit: Payer: Self-pay

## 2011-06-26 DIAGNOSIS — M549 Dorsalgia, unspecified: Secondary | ICD-10-CM

## 2011-06-26 MED ORDER — METHADONE HCL 10 MG PO TABS: 10.0000 mg | ORAL_TABLET | Freq: Three times a day (TID) | ORAL | Status: DC

## 2011-07-19 ENCOUNTER — Encounter: Payer: Self-pay | Admitting: Family Medicine

## 2011-07-20 ENCOUNTER — Other Ambulatory Visit: Payer: Self-pay | Admitting: Family Medicine

## 2011-07-21 ENCOUNTER — Other Ambulatory Visit: Payer: Self-pay

## 2011-07-21 DIAGNOSIS — M549 Dorsalgia, unspecified: Secondary | ICD-10-CM

## 2011-07-21 MED ORDER — METHADONE HCL 10 MG PO TABS: 10.0000 mg | ORAL_TABLET | Freq: Three times a day (TID) | ORAL | Status: DC

## 2011-07-24 IMAGING — CT CT ABDOMEN W/ CM
1 of 3 series · 14 of 32 positions shown, 19 images · IV contrast (Omnipaque 300)
Comparison: Abdominal CT 08/17/2004.  Lumbar MRI 11/09/2005.

CT ABDOMEN

CLINICAL DATA: Nausea and abdominal pain.  Left lower extremity
DVT.

CT ABDOMEN AND PELVIS WITH CONTRAST
TECHNIQUE: Multidetector CT imaging of the abdomen and pelvis was
performed using the standard protocol following bolus
administration of intravenous contrast.
Contrast: 100 ml Qmnipaque-C66 intravenously.

[Series 2: abd_pel_with 5.0 b40f · axial · 0.80mm/px · z∈[+412,+787]mm · 14 of 87 slices shown, 19 images]
[im 6/87  soft-tissue]
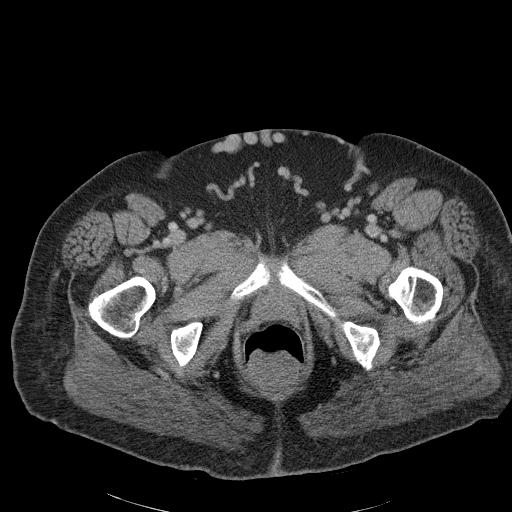
[im 6/87  bone]
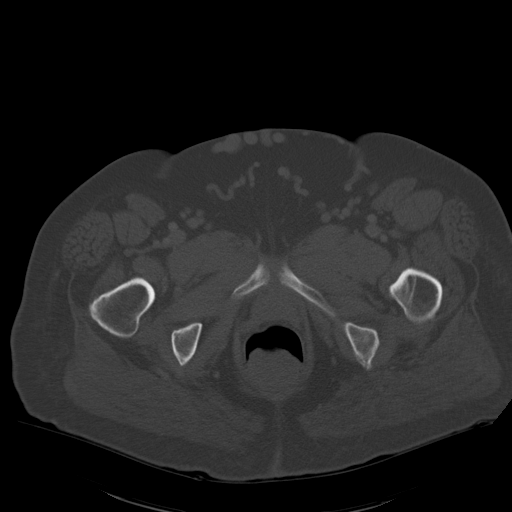
[im 11/87  soft-tissue]
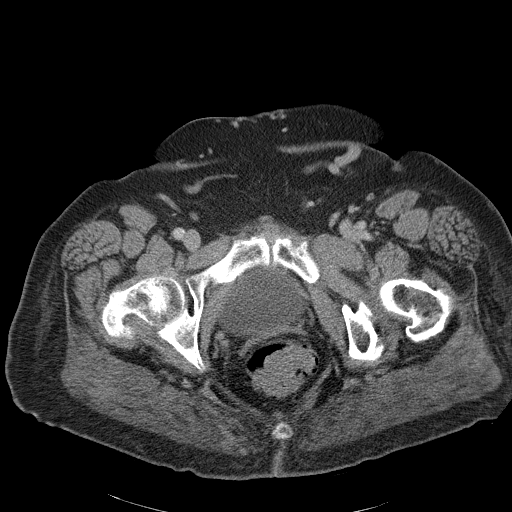
[im 21/87  soft-tissue]
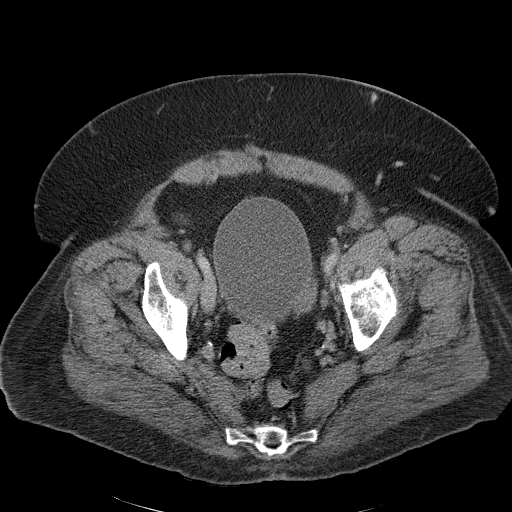
[im 26/87  soft-tissue]
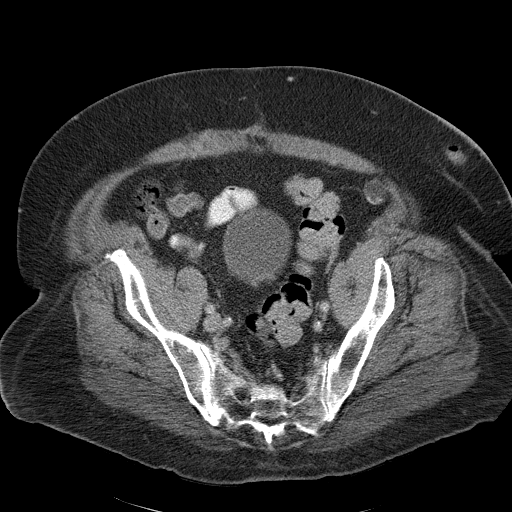
[im 31/87  soft-tissue]
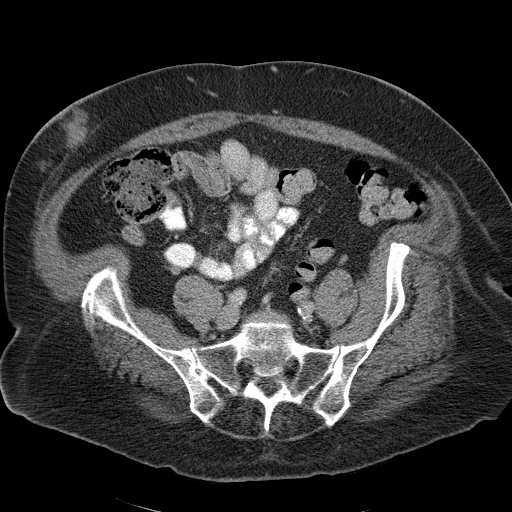
[im 36/87  soft-tissue]
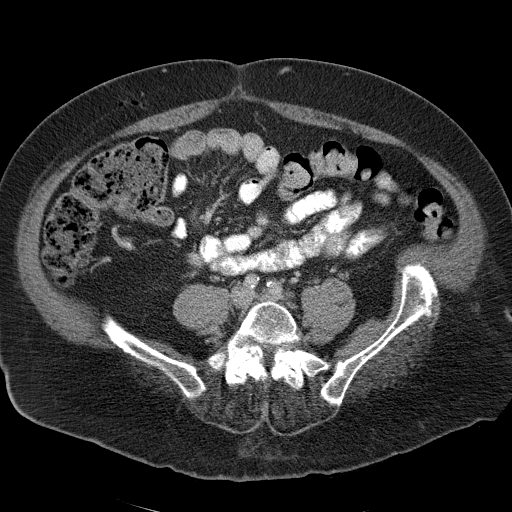
[im 46/87  soft-tissue]
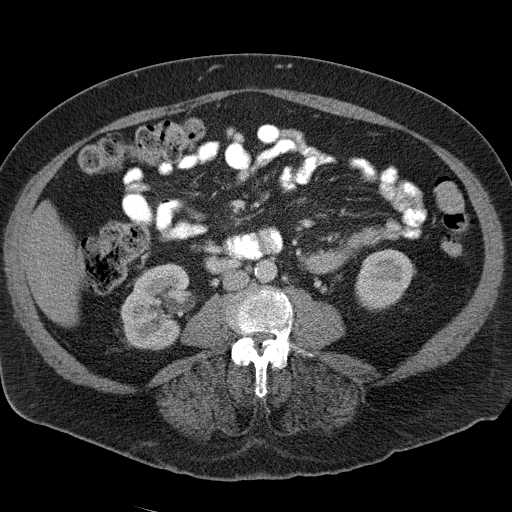
[im 51/87  soft-tissue]
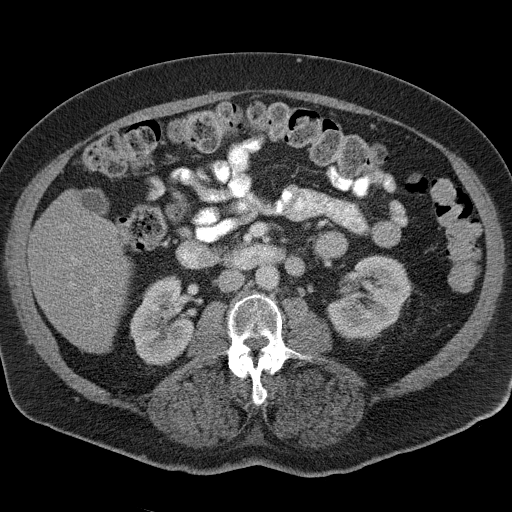
[im 56/87  soft-tissue]
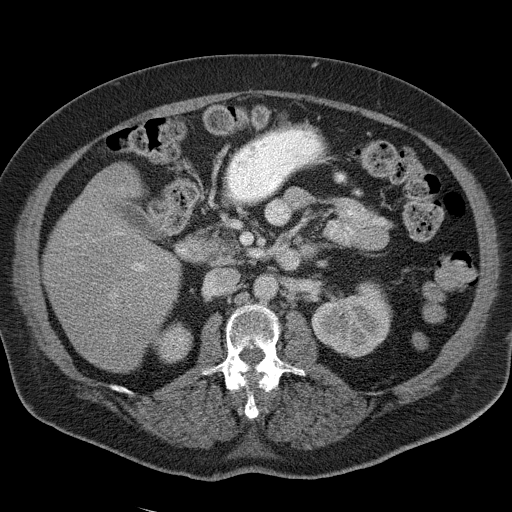
[im 56/87  bone]
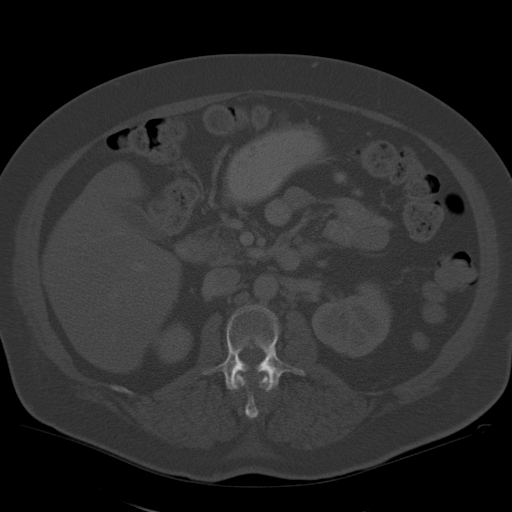
[im 61/87  soft-tissue]
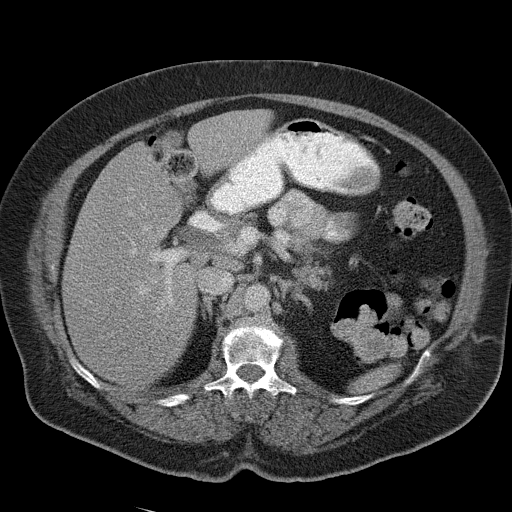
[im 66/87  soft-tissue]
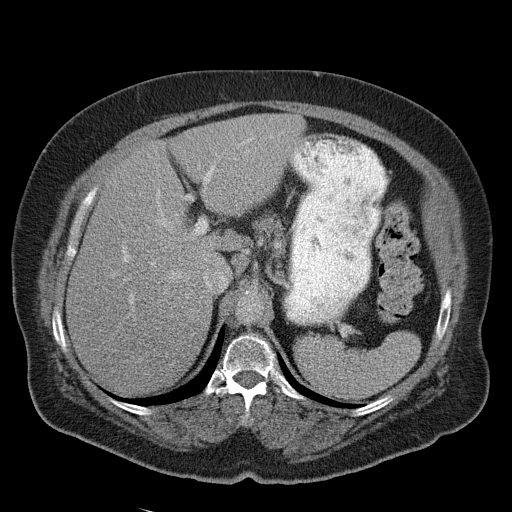
[im 66/87  lung]
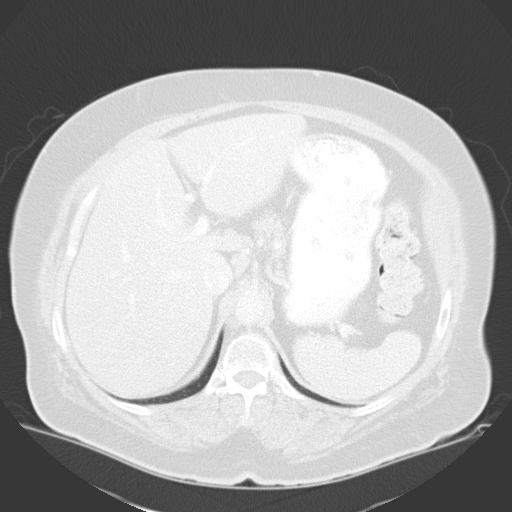
[im 71/87  lung]
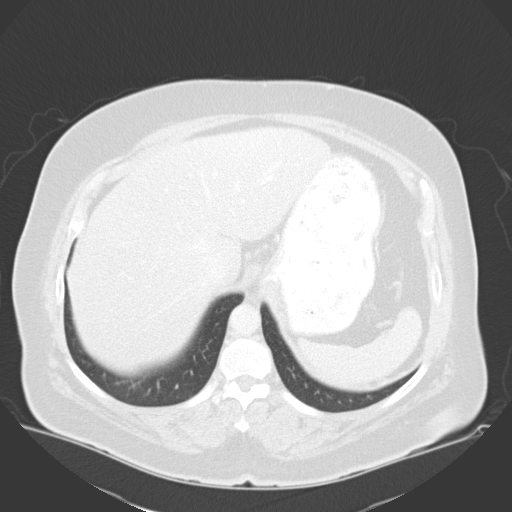
[im 76/87  soft-tissue]
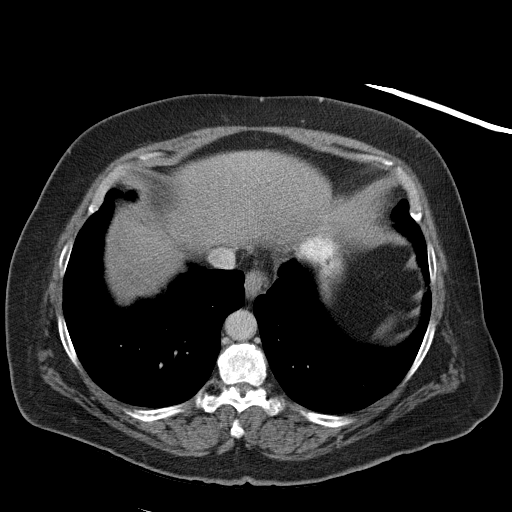
[im 76/87  lung]
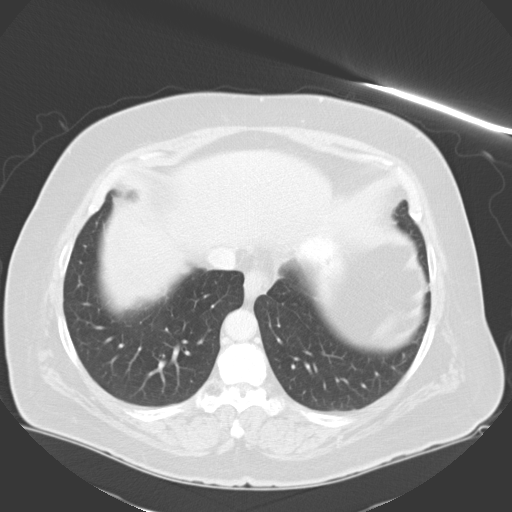
[im 81/87  soft-tissue]
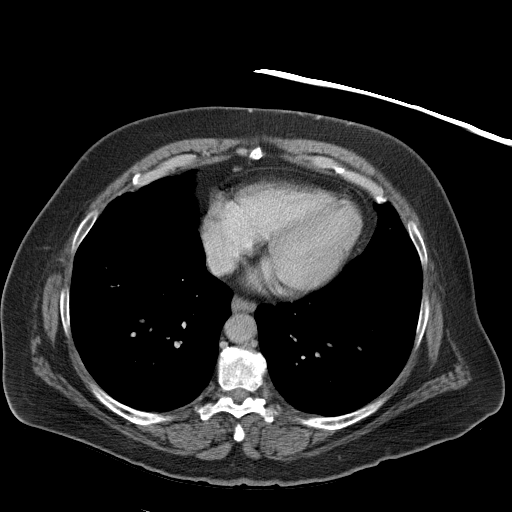
[im 81/87  lung]
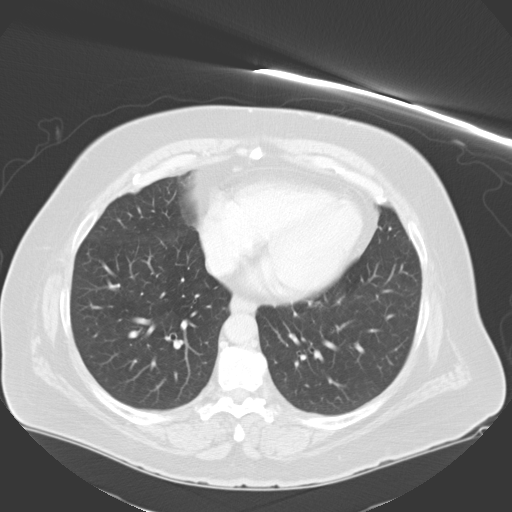

[14 of 32 positions shown; findings below may reference images not displayed]

FINDINGS: The lung bases are clear and there is no pleural
effusion.  The liver, spleen, gallbladder, pancreas, adrenal glands
and kidneys appear normal.  Mildly prominent portacaval lymph nodes
are unchanged.  There are no enlarged lymph nodes.  The bowel gas
pattern is normal.  L1 compression fracture and lumbar spondylosis
are unchanged from the prior MRI.
IMPRESSION: No acute abdominal findings.

CT PELVIS
FINDINGS: Numerous varicosities are noted within the suprapubic
anterior abdominal wall and extending into both inguinal regions.
This may be related to the history of DVT.  No acute pelvic vein
DVT is identified.  There is no pelvic mass, fluid collection or
inflammatory process.  The uterus is surgically absent.  There are
asymmetric degenerative changes of the right hip.  There are
probable injection granulomas in the anterior abdominal wall
bilaterally.
IMPRESSION: 1.  No acute pelvic findings.
2.  Venous collaterals/varicosities in the lower pelvis and
proximal thighs, probably related to given history of lower
extremity DVT.

## 2011-07-25 ENCOUNTER — Encounter: Payer: Self-pay | Admitting: Family Medicine

## 2011-07-25 ENCOUNTER — Ambulatory Visit (INDEPENDENT_AMBULATORY_CARE_PROVIDER_SITE_OTHER): Payer: Medicare Other | Admitting: Family Medicine

## 2011-07-25 VITALS — BP 158/82 | HR 73 | Resp 16 | Ht 63.0 in | Wt 194.0 lb

## 2011-07-25 DIAGNOSIS — R5381 Other malaise: Secondary | ICD-10-CM

## 2011-07-25 DIAGNOSIS — E119 Type 2 diabetes mellitus without complications: Secondary | ICD-10-CM

## 2011-07-25 DIAGNOSIS — E039 Hypothyroidism, unspecified: Secondary | ICD-10-CM

## 2011-07-25 DIAGNOSIS — R5383 Other fatigue: Secondary | ICD-10-CM

## 2011-07-25 DIAGNOSIS — I10 Essential (primary) hypertension: Secondary | ICD-10-CM

## 2011-07-25 DIAGNOSIS — E785 Hyperlipidemia, unspecified: Secondary | ICD-10-CM

## 2011-07-25 MED ORDER — GABAPENTIN 100 MG PO CAPS: 100.0000 mg | ORAL_CAPSULE | Freq: Every day | ORAL | Status: DC

## 2011-07-25 NOTE — Assessment & Plan Note (Signed)
Controlled, no change in medication  

## 2011-07-25 NOTE — Patient Instructions (Signed)
F/u in 4 months.  Please stop the vytorin for the next month, if the muscle pains stop with this then do not resume the medication   Labs today cpk, ckmb,HbaC, TSH  And chem 7     Fasting lipid, cmp and eGFr, hBA1C, TSH vitamin D and microalbuminuria in 4 months before next visit  You are being referred for thyroid ultrasound and to see an endocrinologist.These appointments will be in January  Pls continue to eat healthily and lose weight gradually  Merry Christmas and all the best for 2013!

## 2011-07-25 NOTE — Assessment & Plan Note (Signed)
Pt feels as though his thyroid is enlarging backward and at times she feels as though she can hardly breathe, wants endo involvement

## 2011-07-26 ENCOUNTER — Telehealth: Payer: Self-pay

## 2011-07-26 LAB — BASIC METABOLIC PANEL
BUN: 17 mg/dL (ref 6–23)
Calcium: 9.3 mg/dL (ref 8.4–10.5)
Creat: 0.85 mg/dL (ref 0.50–1.10)
Potassium: 4.9 mEq/L (ref 3.5–5.3)

## 2011-07-26 LAB — HEMOGLOBIN A1C
Hgb A1c MFr Bld: 6 % — ABNORMAL HIGH (ref <5.7)
Mean Plasma Glucose: 126 mg/dL — ABNORMAL HIGH (ref <117)

## 2011-07-26 LAB — CK: Total CK: 183 U/L — ABNORMAL HIGH (ref 7–177)

## 2011-07-26 LAB — CREATININE KINASE MB
CK, MB: 6.2 ng/mL — ABNORMAL HIGH (ref 0.3–4.0)
Relative Index: 3.4 — ABNORMAL HIGH (ref 0.0–2.5)

## 2011-07-26 IMAGING — US US EXTREM LOW VENOUS*R*
1 series · 1 of 1 positions shown · non-contrast
Comparison: 06/14/2004

CLINICAL DATA: Right knee and lower extremity pain and swelling



[Series 1: us rle venous · portal-venous · 1 of 1 slices shown]
[im 1/1]
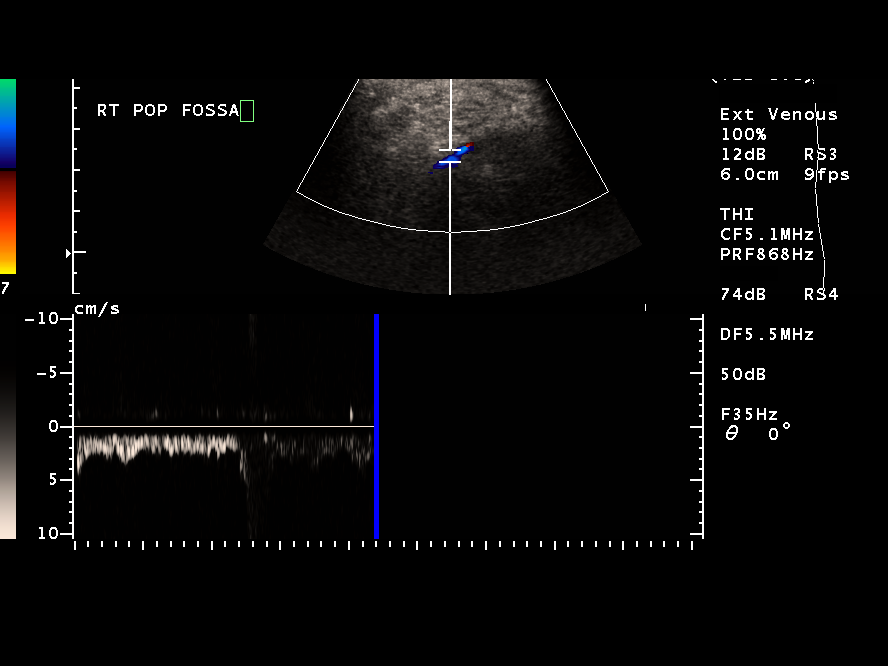

[1 of 1 positions shown; findings below may reference images not displayed]

FINDINGS: Deep venous system appears patent and compressible from the groin
to the abductor canal.
Spontaneous venous flow is present with intact augmentation and
evidence of mild respiratory phasicity.
No intraluminal thrombus is identified at these segments.
Subcutaneous edema is noted at right popliteal fossa and calf.
Arterial flow is detected at the right popliteal artery.
The right popliteal vein is not identified on either gray scale
imaging or color Doppler imaging despite extensive search.
Unable to adequately assess the right popliteal vein or the
proximal deep calf veins for presence of deep venous thrombosis.
IMPRESSION: No evidence of deep venous thrombosis from the right abductor canal
to the right groin.
Unable to visualize the right popliteal vein or the proximal calf
veins, question if related to patient body habitus, but therefore
unable to exclude deep venous thrombosis in the right popliteal
vein or distally.
Findings discussed with Dr. Feninkie at time of interpretation.

## 2011-07-26 NOTE — Telephone Encounter (Signed)
Lab value noted

## 2011-07-26 NOTE — Progress Notes (Signed)
Subjective:     Patient ID: Katelyn Rowland, female   DOB: Feb 19, 1941, 70 y.o.   MRN: [id number]  HPI The PT is here for follow up and re-evaluation of chronic medical conditions, medication management and review of any available recent lab and radiology data.  Preventive health is updated, specifically  Cancer screening and Immunization.   Questions or concerns regarding consultations or procedures which the PT has had in the interim are  addressed. The PT denies any adverse reactions to current medications since the last visit.  There are no new concerns.  C/o increased and worsening muscle pains, advised pt to discontinue vytorin and she will have muscle enzymes checked. C/o difficulty swallowing believes her thyroid is the problem and wants endo eval    Review of Systems See HPI Denies recent fever or chills. Denies sinus pressure, nasal congestion, ear pain or sore throat. Denies chest congestion, productive cough or wheezing. Denies chest pains, palpitations and leg swelling Denies abdominal pain, nausea, vomiting,diarrhea or constipation.   Denies dysuria, frequency, hesitancy or incontinence. Chronic  joint pain, swelling and limitation in mobility.Adequately controlled on medication Denies headaches, seizures, numbness, or tingling. Denies depression, anxiety or insomnia. Denies skin break down or rash.        Objective:   Physical Exam Patient alert and oriented and in no cardiopulmonary distress.  HEENT: No facial asymmetry, EOMI, no sinus tenderness,  oropharynx pink and moist.  Neck supple no adenopathy.Goiter  Chest: Clear to auscultation bilaterally.  CVS: S1, S2 no murmurs, no S3.  ABD: Soft non tender. Bowel sounds normal.  Ext: No edema  Decreased :  ROM spine, shoulders, hips and knees.  Skin: Intact, no ulcerations or rash noted.  Psych: Good eye contact, normal affect. Memory intact not anxious or depressed appearing.  CNS: CN 2-12 intact, power,  tone and sensation normal throughout.     Assessment:         Plan:

## 2011-07-27 IMAGING — XA IR [PERSON_NAME]/[PERSON_NAME]
1 series · 6 of 6 positions shown · non-contrast
Comparison: none

CLINICAL DATA: Left lower extremity DVT and right calf hematoma on
Coumadin.  The patient requires an IVC filter.

[Series 1: run · 6 of 6 slices shown]
[im 1/6]
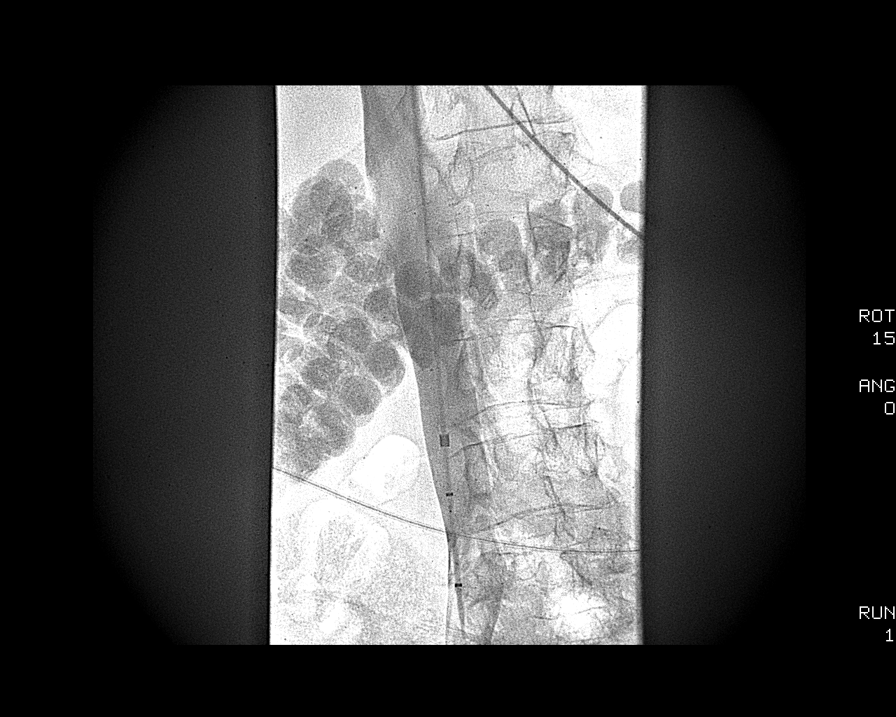
[im 2/6]
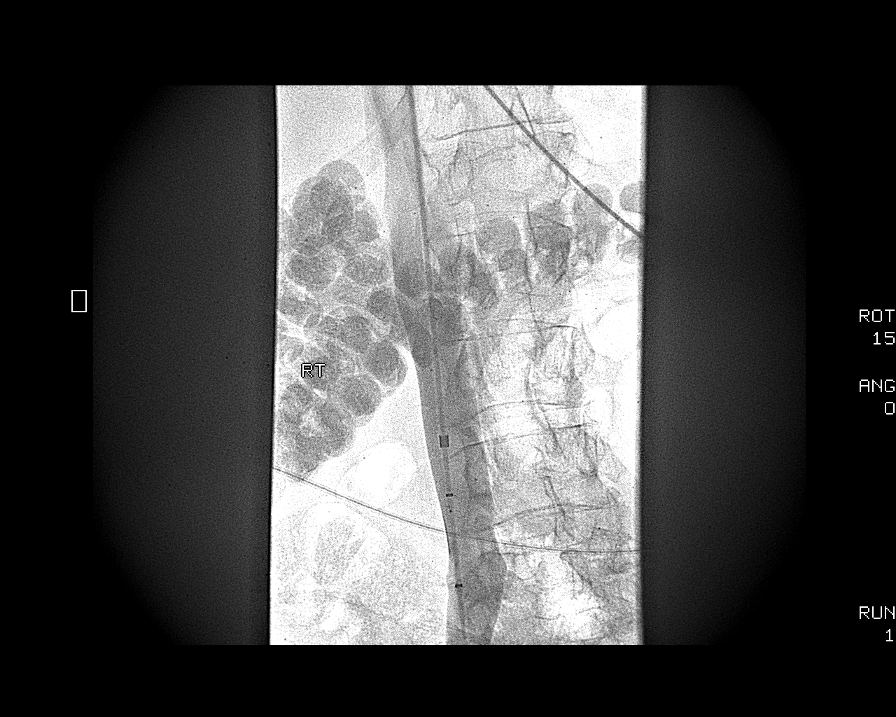
[im 3/6]
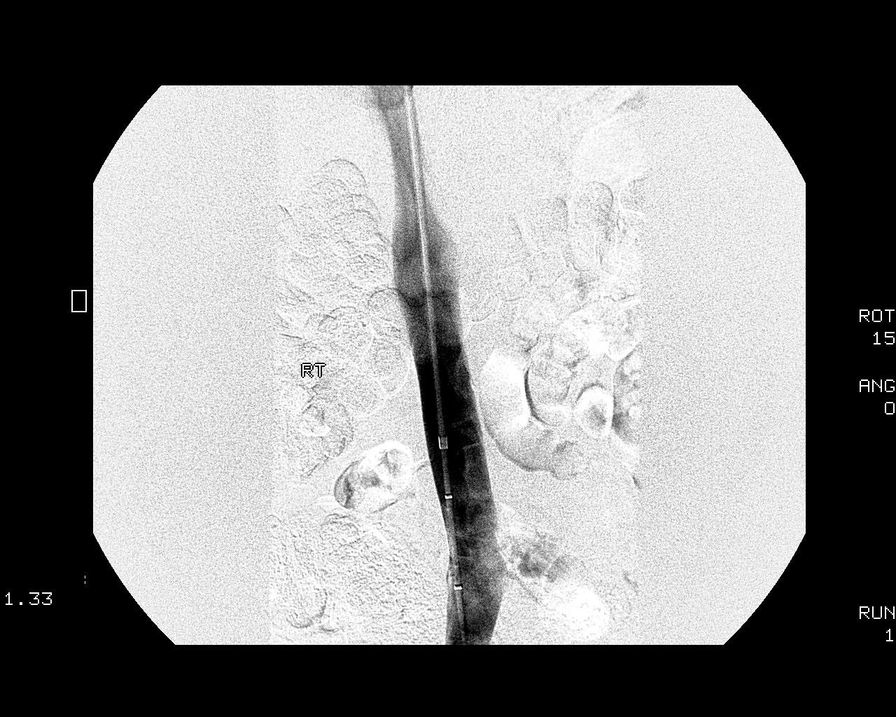
[im 4/6]
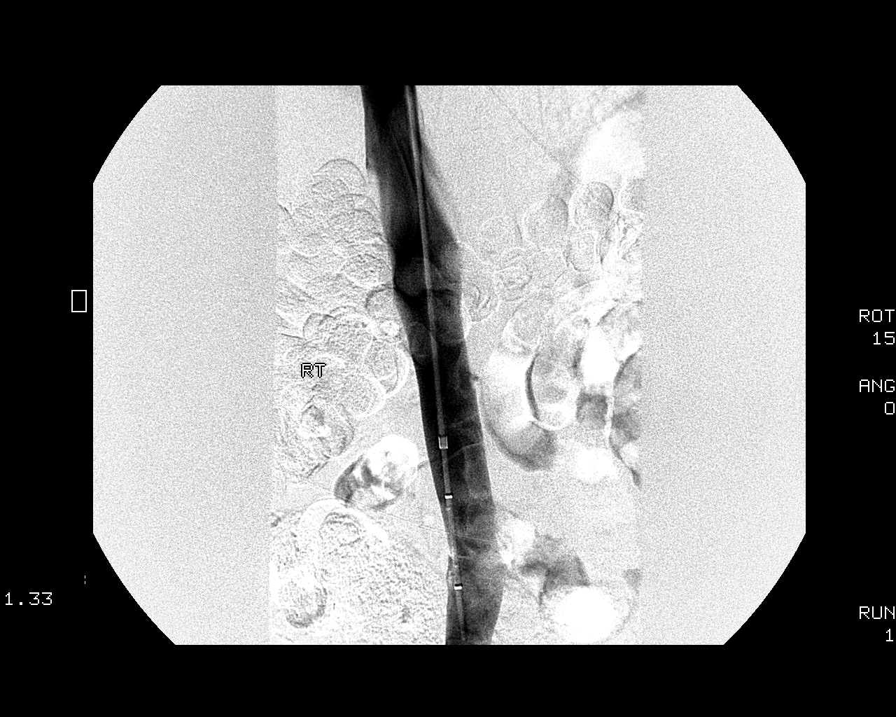
[im 5/6]
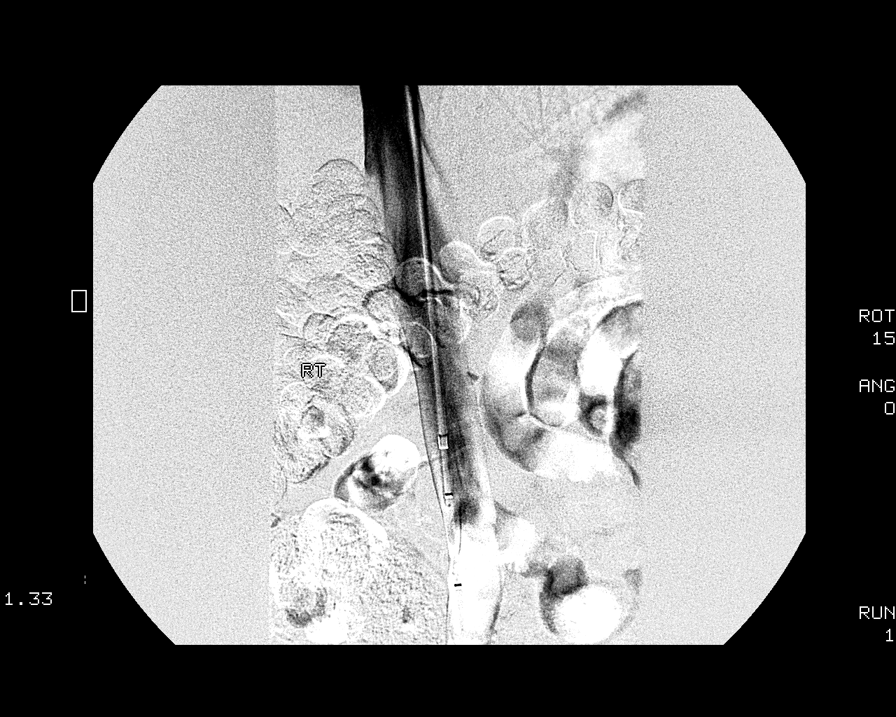
[im 6/6]
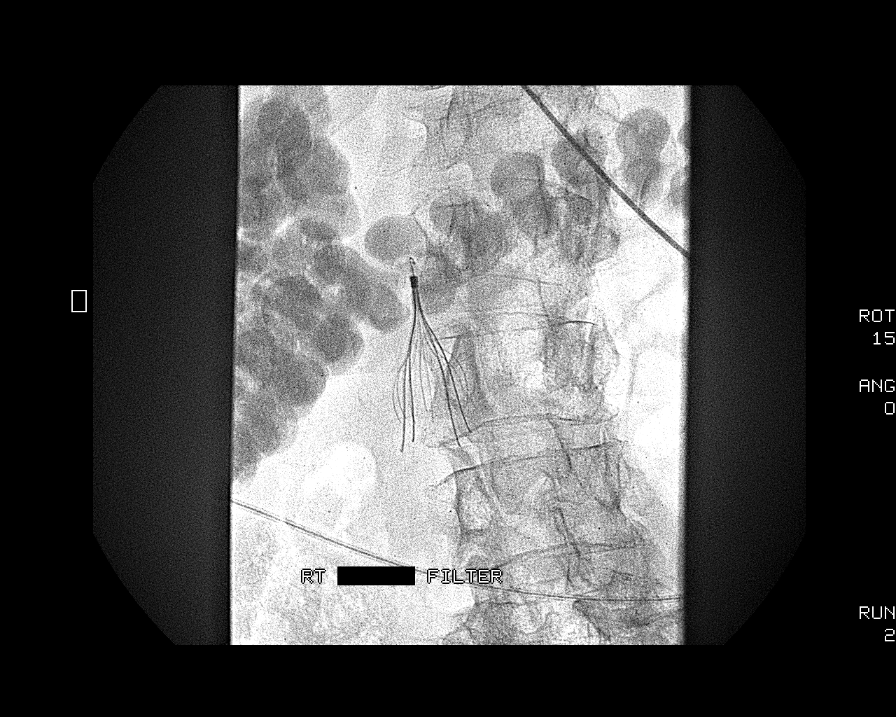

[6 of 6 positions shown; findings below may reference images not displayed]

1.  ULTRASOUND GUIDANCE FOR VASCULAR ACCESS OF THE RIGHT INTERNAL
JUGULAR VEIN.
2.  IVC VENOGRAM
3.  PERCUTANEOUS IVC FILTER PLACEMENT

Sedation:  2.0 mg IV Versed; 100 mcg IV Fentanyl.

Total Moderate Sedation Time:  8 minutes.

Contrast:  45 ml Cmnipaque-YXX

Fluoroscopy Time: 1.7 minutes.

Procedure:  The procedure, risks, benefits, and alternatives were
explained to the patient.  Questions regarding the procedure were
encouraged and answered.  The patient understands and consents to
the procedure.

The right neck was prepped with Betadine in a sterile fashion, and
a sterile drape was applied covering the operative field.  A
sterile gown and sterile gloves were used for the procedure.  Local
anesthesia was provided with 1% Lidocaine.

Under direct ultrasound guidance, a 21 gauge needle was advanced
into the right internal jugular vein with ultrasound image
documentation performed.  After securing access with a
micropuncture dilator, a guidewire was advanced into the inferior
vena cava.  A deployment sheath was advanced over the guidewire.
This was utilized to perform IVC venography.

The deployment sheath was further positioned in an appropriate
location for filter deployment.  Jahvanne Bcrosdale IVC filter was then
advanced in the sheath.  This was then fully deployed in the
infrarenal IVC.  Final filter position was confirmed with a
fluoroscopic spot image.  Contrast injection was also performed
through the sheath under fluoroscopy to confirm patency of the IVC
at the level of the filter.  After the procedure the sheath was
removed and hemostasis obtained with manual compression.

Complications: None
FINDINGS: IVC venography demonstrates a normal caliber IVC with no
evidence of thrombus.  Renal veins are identified bilaterally.  The
IVC filter was successfully positioned below the level of the renal
veins and is appropriately oriented.  This IVC filter has both
permanent and retrievable indications.
IMPRESSION: Placement of percutaneous IVC filter in infrarenal IVC.  IVC
venogram shows no evidence of IVC thrombus and normal caliber of
the inferior vena cava.  This filter does have both permanent and
retrievable indications.

## 2011-07-28 ENCOUNTER — Telehealth: Payer: Self-pay | Admitting: Family Medicine

## 2011-07-28 NOTE — Telephone Encounter (Signed)
See msg

## 2011-07-28 NOTE — Telephone Encounter (Signed)
Is still having bad muscle cramps and hurting really bad. I recommended urgent care but she has a problem with transportation. Wants something sent in for cramps. States if she doesn't hear from [place] she will find a ride somehow to [facility]

## 2011-08-04 ENCOUNTER — Other Ambulatory Visit (HOSPITAL_COMMUNITY): Payer: Medicare Other

## 2011-08-07 ENCOUNTER — Other Ambulatory Visit: Payer: Self-pay

## 2011-08-07 MED ORDER — CYCLOBENZAPRINE HCL 10 MG PO TABS: 10.0000 mg | ORAL_TABLET | Freq: Every evening | ORAL | Status: DC | PRN

## 2011-08-07 NOTE — Telephone Encounter (Signed)
Sent in and she is aware

## 2011-08-07 NOTE — Telephone Encounter (Signed)
pls advise and erx flexeril 10 mg one at bedime #30 no refill if she still needs this and let her know

## 2011-08-18 ENCOUNTER — Other Ambulatory Visit: Payer: Self-pay

## 2011-08-18 DIAGNOSIS — M549 Dorsalgia, unspecified: Secondary | ICD-10-CM

## 2011-08-18 MED ORDER — METHADONE HCL 10 MG PO TABS: 10.0000 mg | ORAL_TABLET | Freq: Three times a day (TID) | ORAL | Status: DC

## 2011-08-24 ENCOUNTER — Telehealth: Payer: Self-pay | Admitting: Family Medicine

## 2011-08-24 NOTE — Telephone Encounter (Signed)
Already collected

## 2011-08-31 ENCOUNTER — Ambulatory Visit (HOSPITAL_COMMUNITY): Payer: Medicare Other

## 2011-09-11 ENCOUNTER — Other Ambulatory Visit: Payer: Self-pay | Admitting: Family Medicine

## 2011-09-15 ENCOUNTER — Other Ambulatory Visit: Payer: Self-pay

## 2011-09-15 DIAGNOSIS — M549 Dorsalgia, unspecified: Secondary | ICD-10-CM

## 2011-09-15 MED ORDER — METHADONE HCL 10 MG PO TABS: 10.0000 mg | ORAL_TABLET | Freq: Three times a day (TID) | ORAL | Status: DC

## 2011-09-19 ENCOUNTER — Ambulatory Visit (HOSPITAL_COMMUNITY)
Admission: RE | Admit: 2011-09-19 | Discharge: 2011-09-19 | Disposition: A | Discharge status: Home / Self Care | Payer: Medicare Other | Source: Ambulatory Visit | Attending: Family Medicine | Admitting: Family Medicine

## 2011-09-19 DIAGNOSIS — E039 Hypothyroidism, unspecified: Secondary | ICD-10-CM | POA: Diagnosis not present

## 2011-09-19 DIAGNOSIS — E0789 Other specified disorders of thyroid: Secondary | ICD-10-CM | POA: Diagnosis not present

## 2011-09-25 DIAGNOSIS — E039 Hypothyroidism, unspecified: Secondary | ICD-10-CM | POA: Diagnosis not present

## 2011-09-25 DIAGNOSIS — I10 Essential (primary) hypertension: Secondary | ICD-10-CM | POA: Diagnosis not present

## 2011-09-27 ENCOUNTER — Other Ambulatory Visit: Payer: Self-pay

## 2011-09-27 MED ORDER — GLUCOSE BLOOD VI STRP: ORAL_STRIP | Status: DC

## 2011-09-27 MED ORDER — FREESTYLE LANCETS MISC: Status: DC

## 2011-09-28 ENCOUNTER — Other Ambulatory Visit: Payer: Self-pay | Admitting: Family Medicine

## 2011-10-03 DIAGNOSIS — E039 Hypothyroidism, unspecified: Secondary | ICD-10-CM | POA: Diagnosis not present

## 2011-10-09 ENCOUNTER — Telehealth: Payer: Self-pay | Admitting: Family Medicine

## 2011-10-13 ENCOUNTER — Other Ambulatory Visit: Payer: Self-pay

## 2011-10-13 DIAGNOSIS — M549 Dorsalgia, unspecified: Secondary | ICD-10-CM

## 2011-10-13 MED ORDER — METHADONE HCL 10 MG PO TABS: 10.0000 mg | ORAL_TABLET | Freq: Three times a day (TID) | ORAL | Status: DC

## 2011-10-30 DIAGNOSIS — J0100 Acute maxillary sinusitis, unspecified: Secondary | ICD-10-CM | POA: Diagnosis not present

## 2011-11-03 ENCOUNTER — Other Ambulatory Visit: Payer: Self-pay | Admitting: Family Medicine

## 2011-11-07 ENCOUNTER — Telehealth: Payer: Self-pay | Admitting: Family Medicine

## 2011-11-08 NOTE — Telephone Encounter (Signed)
Called pt. Advised that she should not be on the metformin. Last note states she was to manage the DM with diet only. All other refills sent in

## 2011-11-10 ENCOUNTER — Other Ambulatory Visit: Payer: Self-pay

## 2011-11-10 DIAGNOSIS — M549 Dorsalgia, unspecified: Secondary | ICD-10-CM

## 2011-11-10 MED ORDER — METHADONE HCL 10 MG PO TABS: 10.0000 mg | ORAL_TABLET | Freq: Three times a day (TID) | ORAL | Status: DC

## 2011-11-28 DIAGNOSIS — E119 Type 2 diabetes mellitus without complications: Secondary | ICD-10-CM | POA: Diagnosis not present

## 2011-11-28 DIAGNOSIS — E785 Hyperlipidemia, unspecified: Secondary | ICD-10-CM | POA: Diagnosis not present

## 2011-11-28 DIAGNOSIS — R5381 Other malaise: Secondary | ICD-10-CM | POA: Diagnosis not present

## 2011-11-29 LAB — CBC WITH DIFFERENTIAL/PLATELET
Basophils Relative: 1 % (ref 0–1)
Eosinophils Absolute: 0.3 10*3/uL (ref 0.0–0.7)
Hemoglobin: 13.7 g/dL (ref 12.0–15.0)
MCH: 31.1 pg (ref 26.0–34.0)
MCHC: 31.5 g/dL (ref 30.0–36.0)
Monocytes Relative: 8 % (ref 3–12)
Neutro Abs: 2.8 10*3/uL (ref 1.7–7.7)
Neutrophils Relative %: 43 % (ref 43–77)
Platelets: 212 10*3/uL (ref 150–400)
RBC: 4.4 MIL/uL (ref 3.87–5.11)

## 2011-11-29 LAB — HEMOGLOBIN A1C
Hgb A1c MFr Bld: 5.8 % — ABNORMAL HIGH (ref <5.7)
Mean Plasma Glucose: 120 mg/dL — ABNORMAL HIGH (ref <117)

## 2011-11-29 LAB — TSH: TSH: 0.862 u[IU]/mL (ref 0.350–4.500)

## 2011-11-29 LAB — COMPLETE METABOLIC PANEL WITH GFR
Albumin: 3.7 g/dL (ref 3.5–5.2)
BUN: 19 mg/dL (ref 6–23)
CO2: 30 mEq/L (ref 19–32)
Calcium: 9.2 mg/dL (ref 8.4–10.5)
Chloride: 102 mEq/L (ref 96–112)
Creat: 0.99 mg/dL (ref 0.50–1.10)
GFR, Est African American: 66 mL/min
Glucose, Bld: 94 mg/dL (ref 70–99)
Potassium: 5 mEq/L (ref 3.5–5.3)

## 2011-11-29 LAB — LIPID PANEL
Cholesterol: 259 mg/dL — ABNORMAL HIGH (ref 0–200)
Triglycerides: 288 mg/dL — ABNORMAL HIGH (ref <150)

## 2011-11-29 LAB — MICROALBUMIN / CREATININE URINE RATIO
Creatinine, Urine: 176.2 mg/dL
Microalb, Ur: 81.95 mg/dL — ABNORMAL HIGH (ref 0.00–1.89)

## 2011-12-01 DIAGNOSIS — J0100 Acute maxillary sinusitis, unspecified: Secondary | ICD-10-CM | POA: Diagnosis not present

## 2011-12-05 ENCOUNTER — Ambulatory Visit (INDEPENDENT_AMBULATORY_CARE_PROVIDER_SITE_OTHER): Payer: Medicare Other | Admitting: Family Medicine

## 2011-12-05 ENCOUNTER — Encounter: Payer: Self-pay | Admitting: Family Medicine

## 2011-12-05 VITALS — BP 130/70 | HR 71 | Resp 16 | Ht 63.0 in | Wt 198.0 lb

## 2011-12-05 DIAGNOSIS — E039 Hypothyroidism, unspecified: Secondary | ICD-10-CM

## 2011-12-05 DIAGNOSIS — E119 Type 2 diabetes mellitus without complications: Secondary | ICD-10-CM

## 2011-12-05 DIAGNOSIS — Z79899 Other long term (current) drug therapy: Secondary | ICD-10-CM | POA: Diagnosis not present

## 2011-12-05 DIAGNOSIS — E785 Hyperlipidemia, unspecified: Secondary | ICD-10-CM | POA: Diagnosis not present

## 2011-12-05 DIAGNOSIS — F411 Generalized anxiety disorder: Secondary | ICD-10-CM

## 2011-12-05 DIAGNOSIS — I10 Essential (primary) hypertension: Secondary | ICD-10-CM

## 2011-12-05 MED ORDER — PRAVASTATIN SODIUM 20 MG PO TABS: ORAL_TABLET | ORAL | Status: DC

## 2011-12-05 MED ORDER — LEVOTHYROXINE SODIUM 200 MCG PO TABS: 200.0000 ug | ORAL_TABLET | Freq: Every day | ORAL | Status: DC

## 2011-12-05 NOTE — Progress Notes (Signed)
  Subjective:    Patient ID: Katelyn Rowland, female    DOB: 03/17/1941, 71 y.o.   MRN: [id number]  HPI The PT is here for follow up and re-evaluation of chronic medical conditions, medication management and review of any available recent lab and radiology data.  Preventive health is updated, specifically  Cancer screening and Immunization.   Questions or concerns regarding consultations or procedures which the PT has had in the interim are  addressed. The PT denies any adverse reactions to current medications since the last visit.  There are no new concerns.  There are no specific complaints . Chronic back pian is unchanged. Denies polyuria , polydipsia or blurred vision, blood sugars fasting are seldom over 120      Review of Systems See HPI Denies recent fever or chills. Denies sinus pressure, nasal congestion, ear pain or sore throat. Denies chest congestion, productive cough or wheezing. Denies chest pains, palpitations and leg swelling Denies abdominal pain, nausea, vomiting,diarrhea or constipation.   Denies dysuria, frequency, hesitancy or incontinence. Denies headaches, seizures, numbness, or tingling. Denies uncontolled  depression, anxiety or insomnia. Denies skin break down or rash.        Objective:   Physical Exam Patient alert and oriented and in no cardiopulmonary distress.  HEENT: No facial asymmetry, EOMI, no sinus tenderness,  oropharynx pink and moist.  Neck supple no adenopathy.  Chest: Clear to auscultation bilaterally.  CVS: S1, S2 no murmurs, no S3.  ABD: Soft non tender. Bowel sounds normal.  Ext: No edema  MS: Adequate though reduced  ROM spine, shoulders, hips and knees.  Skin: Intact, no ulcerations or rash noted.  Psych: Good eye contact, normal affect. Memory intact not anxious or depressed appearing.  CNS: CN 2-12 intact, power, tone and sensation normal throughout.        Assessment & Plan:

## 2011-12-05 NOTE — Patient Instructions (Signed)
F/U in 4 month  HBA1c , cmp and EGFR and tSH in 4 month    It is important that you exercise regularly at least 30 minutes 5 times a week. If you develop chest pain, have severe difficulty breathing, or feel very tired, stop exercising immediately and seek medical attention   A healthy diet is rich in fruit, vegetables and whole grains. Poultry fish, nuts and beans are a healthy choice for protein rather then red meat. A low sodium diet and drinking 64 ounces of water daily is generally recommended. Oils and sweet should be limited. Carbohydrates especially for those who are diabetic or overweight, should be limited to 30-45 gram per meal. It is important to eat on a regular schedule, at least 3 times daily. Snacks should be primarily fruits, vegetables or nuts.

## 2011-12-06 ENCOUNTER — Telehealth: Payer: Self-pay | Admitting: Family Medicine

## 2011-12-06 LAB — DRUG SCREEN, URINE
Amphetamine Screen, Ur: NEGATIVE
Barbiturate Quant, Ur: NEGATIVE
Cocaine Metabolites: NEGATIVE
Creatinine,U: 46.46 mg/dL
Marijuana Metabolite: NEGATIVE
Opiates: NEGATIVE
Phencyclidine (PCP): NEGATIVE

## 2011-12-06 MED ORDER — PROMETHAZINE HCL 25 MG PO TABS: 25.0000 mg | ORAL_TABLET | Freq: Four times a day (QID) | ORAL | Status: DC | PRN

## 2011-12-06 NOTE — Telephone Encounter (Signed)
Med sent in per pt request 

## 2011-12-10 NOTE — Assessment & Plan Note (Signed)
Hyperlipidemia:Low fat diet discussed and encouraged.  Uncontrolled and deteriorated, pt to resume pravastatin

## 2011-12-10 NOTE — Assessment & Plan Note (Signed)
Controlled, no change in medication  

## 2011-12-11 ENCOUNTER — Other Ambulatory Visit: Payer: Self-pay

## 2011-12-11 DIAGNOSIS — M549 Dorsalgia, unspecified: Secondary | ICD-10-CM

## 2011-12-11 MED ORDER — METHADONE HCL 10 MG PO TABS: 10.0000 mg | ORAL_TABLET | Freq: Three times a day (TID) | ORAL | Status: DC

## 2011-12-14 NOTE — Telephone Encounter (Signed)
Patient has been in the office since this message

## 2011-12-15 ENCOUNTER — Other Ambulatory Visit: Payer: Self-pay | Admitting: Family Medicine

## 2011-12-15 ENCOUNTER — Other Ambulatory Visit: Payer: Self-pay

## 2011-12-15 DIAGNOSIS — Z79899 Other long term (current) drug therapy: Secondary | ICD-10-CM

## 2011-12-16 LAB — DRUG SCREEN, URINE
Amphetamine Screen, Ur: NEGATIVE
Barbiturate Quant, Ur: NEGATIVE
Cocaine Metabolites: NEGATIVE
Marijuana Metabolite: NEGATIVE
Methadone: POSITIVE — AB

## 2011-12-29 ENCOUNTER — Other Ambulatory Visit: Payer: Self-pay | Admitting: Family Medicine

## 2012-01-05 ENCOUNTER — Other Ambulatory Visit: Payer: Self-pay

## 2012-01-05 DIAGNOSIS — M549 Dorsalgia, unspecified: Secondary | ICD-10-CM

## 2012-01-05 MED ORDER — METHADONE HCL 10 MG PO TABS: 10.0000 mg | ORAL_TABLET | Freq: Three times a day (TID) | ORAL | Status: DC

## 2012-01-09 ENCOUNTER — Telehealth: Payer: Self-pay | Admitting: Family Medicine

## 2012-01-09 NOTE — Telephone Encounter (Signed)
Spoke with pt and she is on 271mcg and is aware that a rx was sent in on 4/30 with 11 refills.  She will call back if there is any problem.

## 2012-01-09 NOTE — Telephone Encounter (Signed)
Refill x 3 on current dose, April lab was normal, verify dose on record with pt please

## 2012-01-30 ENCOUNTER — Ambulatory Visit (INDEPENDENT_AMBULATORY_CARE_PROVIDER_SITE_OTHER): Payer: Medicare Other

## 2012-01-30 ENCOUNTER — Ambulatory Visit (INDEPENDENT_AMBULATORY_CARE_PROVIDER_SITE_OTHER): Payer: Medicare Other | Admitting: Orthopedic Surgery

## 2012-01-30 ENCOUNTER — Encounter: Payer: Self-pay | Admitting: Orthopedic Surgery

## 2012-01-30 VITALS — BP 140/70 | Ht 63.0 in | Wt 194.0 lb

## 2012-01-30 DIAGNOSIS — M25561 Pain in right knee: Secondary | ICD-10-CM

## 2012-01-30 DIAGNOSIS — M25569 Pain in unspecified knee: Secondary | ICD-10-CM | POA: Diagnosis not present

## 2012-01-30 NOTE — Patient Instructions (Signed)
Degenerative Arthritis You have osteoarthritis. This is the wear and tear arthritis that comes with aging. It is also called degenerative arthritis. This is common in people past middle age. It is caused by stress on the joints. The large weight bearing joints of the lower extremities are most often affected. The knees, hips, back, neck, and hands can become painful, swollen, and stiff. This is the most common type of arthritis. It comes on with age, carrying too much weight, or from an injury. Treatment includes resting the sore joint until the pain and swelling improve. Crutches or a walker may be needed for severe flares. Only take over-the-counter or prescription medicines for pain, discomfort, or fever as directed by your caregiver. Local heat therapy may improve motion. Cortisone shots into the joint are sometimes used to reduce pain and swelling during flares. Osteoarthritis is usually not crippling and progresses slowly. There are things you can do to decrease pain:  Avoid high impact activities.   Exercise regularly.   Low impact exercises such as walking, biking and swimming help to keep the muscles strong and keep normal joint function.   Stretching helps to keep your range of motion.   Lose weight if you are overweight. This reduces joint stress.  In severe cases when you have pain at rest or increasing disability, joint surgery may be helpful. See your caregiver for follow-up treatment as recommended.   SEEK IMMEDIATE MEDICAL CARE IF:    You have severe joint pain.   Marked swelling and redness in your joint develops.   You develop a high fever.  Document Released: 07/24/2005 Document Revised: 07/13/2011 Document Reviewed: 12/24/2006 [facility] Patient Information 2012 [place].  Take ibuprofen and nabumetone as needed

## 2012-01-30 NOTE — Progress Notes (Signed)
  Subjective:    Katelyn Rowland is a 71 y.o. female who presents With acute onset of RIGHT knee pain, back in October 2001. She describes sharp, constant and out of 10. RIGHT knee pain, which is worse with walking, better with sitting, associated with some catching in the knee, as well as some tingling in her leg and pain in her lumbar spine. She carries a diagnosis of spinal stenosis and has a bulging disc in her lower back.  Review of systems recorded fatigue, heartburn, difficulty urinating, easy bleeding, seasonal allergies, and tremors, she has joint pain and swelling, stiffness, and muscle pain as well. The following portions of the patient's history were reviewed and updated as appropriate: allergies, current medications, past family history, past medical history, past social history, past surgical history and problem list.   Review of Systems Pertinent items are noted in HPI.   Objective:  Vital signs are stable as recorded  General appearance is normal  The patient is alert and oriented x3  The patient's mood and affect are normal  Gait assessment: She does have some difficulty with ambulation with a slight alteration of gait The cardiovascular exam reveals normal pulses and temperature without edema swelling.  The lymphatic system is negative for palpable lymph nodes  The sensory exam is normal.  There are no pathologic reflexes.  Balance is normal.   Exam of the RIGHT knee medial joint line tenderness. No effusion. Strength is normal. Knee is stable. Skin is intact. Range of motion 120  LEFT knee, nontender. No swelling full range of motion. All ligaments are stable. Muscle tone is normal. Skin is intact   X-ray We see that she has 50% loss of joint space on the medial side, consistent with osteoarthritis  Assessment:    Right osteoarthritis RIGHT knee   Plan:    she is allergic to steroids, and cannot have an injection, and she is comfortable, taking ibuprofen  or nabumetone.

## 2012-02-12 ENCOUNTER — Other Ambulatory Visit: Payer: Self-pay

## 2012-02-12 DIAGNOSIS — M549 Dorsalgia, unspecified: Secondary | ICD-10-CM

## 2012-02-12 MED ORDER — METHADONE HCL 10 MG PO TABS: 10.0000 mg | ORAL_TABLET | Freq: Three times a day (TID) | ORAL | Status: DC

## 2012-02-19 ENCOUNTER — Other Ambulatory Visit: Payer: Self-pay | Admitting: Family Medicine

## 2012-02-28 ENCOUNTER — Other Ambulatory Visit: Payer: Self-pay | Admitting: Family Medicine

## 2012-03-08 ENCOUNTER — Other Ambulatory Visit: Payer: Self-pay

## 2012-03-08 DIAGNOSIS — M549 Dorsalgia, unspecified: Secondary | ICD-10-CM

## 2012-03-08 MED ORDER — METHADONE HCL 10 MG PO TABS: 10.0000 mg | ORAL_TABLET | Freq: Three times a day (TID) | ORAL | Status: DC

## 2012-04-01 ENCOUNTER — Ambulatory Visit: Payer: Medicare Other | Admitting: Family Medicine

## 2012-04-04 DIAGNOSIS — M545 Low back pain, unspecified: Secondary | ICD-10-CM | POA: Diagnosis not present

## 2012-04-04 DIAGNOSIS — IMO0002 Reserved for concepts with insufficient information to code with codable children: Secondary | ICD-10-CM | POA: Diagnosis not present

## 2012-04-05 ENCOUNTER — Other Ambulatory Visit: Payer: Self-pay

## 2012-04-05 DIAGNOSIS — M549 Dorsalgia, unspecified: Secondary | ICD-10-CM

## 2012-04-05 MED ORDER — METHADONE HCL 10 MG PO TABS: 10.0000 mg | ORAL_TABLET | Freq: Three times a day (TID) | ORAL | Status: DC

## 2012-04-09 ENCOUNTER — Other Ambulatory Visit: Payer: Self-pay | Admitting: Family Medicine

## 2012-04-09 DIAGNOSIS — E039 Hypothyroidism, unspecified: Secondary | ICD-10-CM | POA: Diagnosis not present

## 2012-04-09 DIAGNOSIS — E119 Type 2 diabetes mellitus without complications: Secondary | ICD-10-CM | POA: Diagnosis not present

## 2012-04-10 LAB — COMPLETE METABOLIC PANEL WITH GFR
AST: 22 U/L (ref 0–37)
Albumin: 3.4 g/dL — ABNORMAL LOW (ref 3.5–5.2)
BUN: 16 mg/dL (ref 6–23)
CO2: 31 mEq/L (ref 19–32)
Calcium: 9.3 mg/dL (ref 8.4–10.5)
Chloride: 103 mEq/L (ref 96–112)
GFR, Est African American: 67 mL/min
Glucose, Bld: 89 mg/dL (ref 70–99)
Potassium: 4.8 mEq/L (ref 3.5–5.3)

## 2012-04-10 LAB — TSH: TSH: 1.012 u[IU]/mL (ref 0.350–4.500)

## 2012-04-17 ENCOUNTER — Other Ambulatory Visit (HOSPITAL_COMMUNITY): Payer: Self-pay | Admitting: Neurosurgery

## 2012-04-17 DIAGNOSIS — M549 Dorsalgia, unspecified: Secondary | ICD-10-CM

## 2012-04-24 ENCOUNTER — Ambulatory Visit: Payer: Medicare Other | Admitting: Family Medicine

## 2012-04-25 ENCOUNTER — Ambulatory Visit (HOSPITAL_COMMUNITY)
Admission: RE | Admit: 2012-04-25 | Discharge: 2012-04-25 | Disposition: A | Discharge status: Home / Self Care | Payer: Medicare Other | Source: Ambulatory Visit | Attending: Neurosurgery | Admitting: Neurosurgery

## 2012-04-25 ENCOUNTER — Other Ambulatory Visit (HOSPITAL_COMMUNITY): Payer: Medicare Other

## 2012-04-25 DIAGNOSIS — M549 Dorsalgia, unspecified: Secondary | ICD-10-CM

## 2012-04-25 DIAGNOSIS — M5126 Other intervertebral disc displacement, lumbar region: Secondary | ICD-10-CM | POA: Insufficient documentation

## 2012-04-25 DIAGNOSIS — M5137 Other intervertebral disc degeneration, lumbosacral region: Secondary | ICD-10-CM | POA: Insufficient documentation

## 2012-04-25 DIAGNOSIS — M546 Pain in thoracic spine: Secondary | ICD-10-CM | POA: Insufficient documentation

## 2012-04-25 DIAGNOSIS — M545 Low back pain, unspecified: Secondary | ICD-10-CM | POA: Insufficient documentation

## 2012-04-25 DIAGNOSIS — M5124 Other intervertebral disc displacement, thoracic region: Secondary | ICD-10-CM | POA: Insufficient documentation

## 2012-04-25 DIAGNOSIS — M51379 Other intervertebral disc degeneration, lumbosacral region without mention of lumbar back pain or lower extremity pain: Secondary | ICD-10-CM | POA: Insufficient documentation

## 2012-04-26 ENCOUNTER — Telehealth: Payer: Self-pay | Admitting: Family Medicine

## 2012-04-26 MED ORDER — PROMETHAZINE HCL 25 MG PO TABS: 25.0000 mg | ORAL_TABLET | Freq: Four times a day (QID) | ORAL | Status: DC | PRN

## 2012-04-26 NOTE — Telephone Encounter (Signed)
Refill sent in

## 2012-04-29 ENCOUNTER — Encounter: Payer: Self-pay | Admitting: Family Medicine

## 2012-04-29 ENCOUNTER — Ambulatory Visit (INDEPENDENT_AMBULATORY_CARE_PROVIDER_SITE_OTHER): Payer: Medicare Other | Admitting: Family Medicine

## 2012-04-29 VITALS — BP 160/90 | HR 75 | Resp 16 | Ht 63.0 in | Wt 198.0 lb

## 2012-04-29 DIAGNOSIS — R809 Proteinuria, unspecified: Secondary | ICD-10-CM | POA: Diagnosis not present

## 2012-04-29 DIAGNOSIS — M8430XA Stress fracture, unspecified site, initial encounter for fracture: Secondary | ICD-10-CM

## 2012-04-29 DIAGNOSIS — M549 Dorsalgia, unspecified: Secondary | ICD-10-CM | POA: Diagnosis not present

## 2012-04-29 DIAGNOSIS — E039 Hypothyroidism, unspecified: Secondary | ICD-10-CM

## 2012-04-29 DIAGNOSIS — E785 Hyperlipidemia, unspecified: Secondary | ICD-10-CM

## 2012-04-29 DIAGNOSIS — G4700 Insomnia, unspecified: Secondary | ICD-10-CM

## 2012-04-29 DIAGNOSIS — I10 Essential (primary) hypertension: Secondary | ICD-10-CM

## 2012-04-29 MED ORDER — CALCIUM CARBONATE-VITAMIN D 500-200 MG-UNIT PO TABS: 1.0000 | ORAL_TABLET | Freq: Three times a day (TID) | ORAL | Status: DC

## 2012-04-29 MED ORDER — ALENDRONATE SODIUM 70 MG PO TABS: 70.0000 mg | ORAL_TABLET | ORAL | Status: DC

## 2012-04-29 NOTE — Progress Notes (Signed)
  Subjective:    Patient ID: Katelyn Rowland, female    DOB: 07-15-1941, 71 y.o.   MRN: [id number]  HPI The PT is here for follow up and re-evaluation of chronic medical conditions, medication management and review of any available recent lab and radiology data.  Preventive health is updated, specifically  Cancer screening and Immunization.Continues to refuse cancer screening tests and flu vaccine   4 weeks ago pt states she noted that her back pain was worsening, has had  c spine surgery years ago, she called Dr Dorothyann Gibbs who had operated in the past had MRI of thoracic and lumbars spine which show severe stenosis, disc disease and possible compression,she is requesting inc in dose of methadone  The PT denies any adverse reactions to current medications since the last visit.       Review of Systems See HPI Denies recent fever or chills. Denies sinus pressure, nasal congestion, ear pain or sore throat. Denies chest congestion, productive cough or wheezing. Denies chest pains, palpitations and leg swelling Denies abdominal pain, nausea, vomiting,diarrhea or constipation.   Denies dysuria, frequency, hesitancy or incontinence.  Denies headaches, seizures, numbness, or tingling. Denies depression, anxiety or insomnia. Denies skin break down or rash.        Objective:   Physical Exam  Patient alert and oriented and in no cardiopulmonary distress.  HEENT: No facial asymmetry, EOMI, no sinus tenderness,  oropharynx pink and moist.  Neck supple no adenopathy.  Chest: Clear to auscultation bilaterally.  CVS: S1, S2 no murmurs, no S3.  ABD: Soft non tender. Bowel sounds normal.  Ext: No edema  [id number]   ROM spine,  hips and knees.  Skin: Intact, no ulcerations or rash noted.  Psych: Good eye contact, normal affect. Memory intact not anxious or depressed appearing.  CNS: CN 2-12 intact, power, tone and sensation normal throughout.       Assessment & Plan:

## 2012-04-29 NOTE — Patient Instructions (Addendum)
F/U in 2 month  You are referred for a bone density scan, and you need to start calcium with D 3 times daily, also weekly bone builder fosamax  I ask that you try a different cholesterol 3 times weekly, lowest diose, lovastatin , if no muscle cramps please take this   Dose increase on methadone to every 6 hours short term for the next 2 months, you need to follow up in 2 month

## 2012-05-01 ENCOUNTER — Other Ambulatory Visit: Payer: Self-pay | Admitting: Family Medicine

## 2012-05-03 ENCOUNTER — Telehealth: Payer: Self-pay | Admitting: Family Medicine

## 2012-05-03 MED ORDER — GABAPENTIN 100 MG PO CAPS: ORAL_CAPSULE | ORAL | Status: DC

## 2012-05-03 MED ORDER — POLYETHYLENE GLYCOL 3350 17 GM/SCOOP PO POWD: ORAL | Status: DC

## 2012-05-03 NOTE — Telephone Encounter (Signed)
Sent in

## 2012-05-08 ENCOUNTER — Other Ambulatory Visit (HOSPITAL_COMMUNITY): Payer: Medicare Other

## 2012-05-09 DIAGNOSIS — M542 Cervicalgia: Secondary | ICD-10-CM | POA: Diagnosis not present

## 2012-05-09 DIAGNOSIS — M545 Low back pain, unspecified: Secondary | ICD-10-CM | POA: Diagnosis not present

## 2012-05-09 DIAGNOSIS — M4804 Spinal stenosis, thoracic region: Secondary | ICD-10-CM | POA: Diagnosis not present

## 2012-05-09 DIAGNOSIS — M48061 Spinal stenosis, lumbar region without neurogenic claudication: Secondary | ICD-10-CM | POA: Diagnosis not present

## 2012-05-12 NOTE — Assessment & Plan Note (Signed)
Controlled, no change in medication  

## 2012-05-12 NOTE — Assessment & Plan Note (Signed)
Uncontrolled. Low fat diet discussed and encouraged.  Pt needs to resume lipid lowering medicatrion

## 2012-05-12 NOTE — Assessment & Plan Note (Signed)
Uncontrolled at this visit, reportedly due to ncontrolled pain Generally better cotrolled, no change in med at this time, will review at follow up and titrate up med if needed. DASH diet and commitment to daily physical activity for a minimum of 30 minutes discussed and encouraged, as a part of hypertension management. The importance of attaining a healthy weight is also discussed.

## 2012-05-12 NOTE — Assessment & Plan Note (Signed)
Sleep hygiene reviewed, pt to continue medication

## 2012-05-12 NOTE — Assessment & Plan Note (Signed)
Uncontrolled, dose increase in medication 

## 2012-05-14 ENCOUNTER — Ambulatory Visit (HOSPITAL_COMMUNITY)
Admission: RE | Admit: 2012-05-14 | Discharge: 2012-05-14 | Disposition: A | Discharge status: Home / Self Care | Payer: Medicare Other | Source: Ambulatory Visit | Attending: Family Medicine | Admitting: Family Medicine

## 2012-05-14 DIAGNOSIS — R809 Proteinuria, unspecified: Secondary | ICD-10-CM | POA: Insufficient documentation

## 2012-05-20 ENCOUNTER — Other Ambulatory Visit: Payer: Self-pay | Admitting: Neurosurgery

## 2012-05-21 ENCOUNTER — Ambulatory Visit (HOSPITAL_COMMUNITY)
Admission: RE | Admit: 2012-05-21 | Discharge: 2012-05-21 | Disposition: A | Discharge status: Home / Self Care | Payer: Medicare Other | Source: Ambulatory Visit | Attending: Family Medicine | Admitting: Family Medicine

## 2012-05-21 DIAGNOSIS — M8430XA Stress fracture, unspecified site, initial encounter for fracture: Secondary | ICD-10-CM | POA: Insufficient documentation

## 2012-05-21 DIAGNOSIS — Z90710 Acquired absence of both cervix and uterus: Secondary | ICD-10-CM | POA: Insufficient documentation

## 2012-05-21 DIAGNOSIS — M949 Disorder of cartilage, unspecified: Secondary | ICD-10-CM | POA: Diagnosis not present

## 2012-05-21 DIAGNOSIS — Z780 Asymptomatic menopausal state: Secondary | ICD-10-CM | POA: Insufficient documentation

## 2012-05-21 DIAGNOSIS — Z9079 Acquired absence of other genital organ(s): Secondary | ICD-10-CM | POA: Insufficient documentation

## 2012-05-21 DIAGNOSIS — M899 Disorder of bone, unspecified: Secondary | ICD-10-CM | POA: Diagnosis not present

## 2012-05-21 DIAGNOSIS — Z13820 Encounter for screening for osteoporosis: Secondary | ICD-10-CM | POA: Diagnosis not present

## 2012-05-30 ENCOUNTER — Other Ambulatory Visit: Payer: Self-pay | Admitting: Family Medicine

## 2012-06-03 ENCOUNTER — Other Ambulatory Visit: Payer: Self-pay

## 2012-06-03 ENCOUNTER — Other Ambulatory Visit (HOSPITAL_COMMUNITY): Payer: Medicare Other

## 2012-06-03 DIAGNOSIS — M549 Dorsalgia, unspecified: Secondary | ICD-10-CM

## 2012-06-03 MED ORDER — METHADONE HCL 10 MG PO TABS: 10.0000 mg | ORAL_TABLET | Freq: Four times a day (QID) | ORAL | Status: DC

## 2012-06-04 ENCOUNTER — Encounter (HOSPITAL_COMMUNITY)
Admission: RE | Admit: 2012-06-04 | Discharge: 2012-06-04 | Disposition: A | Discharge status: Home / Self Care | Payer: Medicare Other | Source: Ambulatory Visit | Attending: Neurosurgery | Admitting: Neurosurgery

## 2012-06-04 ENCOUNTER — Ambulatory Visit (HOSPITAL_COMMUNITY)
Admission: RE | Admit: 2012-06-04 | Discharge: 2012-06-04 | Disposition: A | Discharge status: Home / Self Care | Payer: Medicare Other | Source: Ambulatory Visit | Attending: Neurosurgery | Admitting: Neurosurgery

## 2012-06-04 ENCOUNTER — Encounter (HOSPITAL_COMMUNITY): Payer: Self-pay

## 2012-06-04 DIAGNOSIS — I10 Essential (primary) hypertension: Secondary | ICD-10-CM | POA: Diagnosis not present

## 2012-06-04 DIAGNOSIS — Z01818 Encounter for other preprocedural examination: Secondary | ICD-10-CM | POA: Diagnosis not present

## 2012-06-04 DIAGNOSIS — Z01811 Encounter for preprocedural respiratory examination: Secondary | ICD-10-CM | POA: Diagnosis not present

## 2012-06-04 DIAGNOSIS — Z01812 Encounter for preprocedural laboratory examination: Secondary | ICD-10-CM | POA: Diagnosis not present

## 2012-06-04 HISTORY — DX: Nausea with vomiting, unspecified: R11.2

## 2012-06-04 HISTORY — DX: Gastro-esophageal reflux disease without esophagitis: K21.9

## 2012-06-04 HISTORY — DX: Headache: R51

## 2012-06-04 HISTORY — DX: Cramp and spasm: R25.2

## 2012-06-04 HISTORY — DX: Other specified postprocedural states: Z98.890

## 2012-06-04 LAB — SURGICAL PCR SCREEN: MRSA, PCR: NEGATIVE

## 2012-06-04 LAB — CBC
HCT: 42 % (ref 36.0–46.0)
MCH: 31.5 pg (ref 26.0–34.0)
MCHC: 34 g/dL (ref 30.0–36.0)
MCV: 92.5 fL (ref 78.0–100.0)
RDW: 13 % (ref 11.5–15.5)

## 2012-06-04 LAB — BASIC METABOLIC PANEL
BUN: 18 mg/dL (ref 6–23)
CO2: 30 mEq/L (ref 19–32)
Chloride: 101 mEq/L (ref 96–112)
Creatinine, Ser: 0.91 mg/dL (ref 0.50–1.10)

## 2012-06-04 NOTE — Progress Notes (Signed)
Dr Moshe Cipro called for all cardiac related tests .Marland Kitchen

## 2012-06-04 NOTE — Pre-Procedure Instructions (Signed)
[place]  06/04/2012   Your procedure is scheduled on:  06/12/12  Report to [facility] at 630 AM.  Call this number if you have problems the morning of surgery: [phone]   Remember:   Do not eat food:After Midnight.    Take these medicines the morning of surgery with A SIP OF WATER: neurontin,synthroid,methodone,phenergan   Do not wear jewelry, make-up or nail polish.  Do not wear lotions, powders, or perfumes. You may wear deodorant.  Do not shave 48 hours prior to surgery. Men may shave face and neck.  Do not bring valuables to the hospital.  Contacts, dentures or bridgework may not be worn into surgery.  Leave suitcase in the car. After surgery it may be brought to your room.  For patients admitted to the hospital, checkout time is 11:00 AM the day of discharge.   Patients discharged the day of surgery will not be allowed to drive home.  Name and phone number of your driver: family  Special Instructions: Shower using CHG 2 nights before surgery and the night before surgery.  If you shower the day of surgery use CHG.  Use special wash - you have one bottle of CHG for all showers.  You should use approximately 1/3 of the bottle for each shower.   Please read over the following fact sheets that you were given: Pain Booklet, Coughing and Deep Breathing, Surgical Site Infection Prevention and Anesthesia Post-op Instructions

## 2012-06-12 ENCOUNTER — Inpatient Hospital Stay (HOSPITAL_COMMUNITY)
Admission: RE | Admit: 2012-06-12 | Discharge: 2012-06-17 | DRG: 490 | Disposition: A | Discharge status: Home Health | Payer: Medicare Other | Source: Ambulatory Visit | Attending: Neurosurgery | Admitting: Neurosurgery

## 2012-06-12 ENCOUNTER — Encounter (HOSPITAL_COMMUNITY)
Admission: RE | Disposition: A | Discharge status: Home Health | Payer: Self-pay | Source: Ambulatory Visit | Attending: Neurosurgery

## 2012-06-12 ENCOUNTER — Ambulatory Visit (HOSPITAL_COMMUNITY): Payer: Medicare Other

## 2012-06-12 ENCOUNTER — Ambulatory Visit (HOSPITAL_COMMUNITY): Payer: Medicare Other | Admitting: Anesthesiology

## 2012-06-12 ENCOUNTER — Encounter (HOSPITAL_COMMUNITY): Payer: Self-pay | Admitting: Anesthesiology

## 2012-06-12 ENCOUNTER — Encounter (HOSPITAL_COMMUNITY): Payer: Self-pay | Admitting: *Deleted

## 2012-06-12 DIAGNOSIS — M4804 Spinal stenosis, thoracic region: Secondary | ICD-10-CM | POA: Diagnosis present

## 2012-06-12 DIAGNOSIS — Z79899 Other long term (current) drug therapy: Secondary | ICD-10-CM | POA: Diagnosis not present

## 2012-06-12 DIAGNOSIS — Z836 Family history of other diseases of the respiratory system: Secondary | ICD-10-CM

## 2012-06-12 DIAGNOSIS — E039 Hypothyroidism, unspecified: Secondary | ICD-10-CM

## 2012-06-12 DIAGNOSIS — Z833 Family history of diabetes mellitus: Secondary | ICD-10-CM

## 2012-06-12 DIAGNOSIS — E119 Type 2 diabetes mellitus without complications: Secondary | ICD-10-CM | POA: Diagnosis not present

## 2012-06-12 DIAGNOSIS — Z888 Allergy status to other drugs, medicaments and biological substances status: Secondary | ICD-10-CM | POA: Diagnosis not present

## 2012-06-12 DIAGNOSIS — Z87891 Personal history of nicotine dependence: Secondary | ICD-10-CM | POA: Diagnosis not present

## 2012-06-12 DIAGNOSIS — I10 Essential (primary) hypertension: Secondary | ICD-10-CM | POA: Diagnosis present

## 2012-06-12 DIAGNOSIS — F3289 Other specified depressive episodes: Secondary | ICD-10-CM | POA: Diagnosis present

## 2012-06-12 DIAGNOSIS — M8430XA Stress fracture, unspecified site, initial encounter for fracture: Secondary | ICD-10-CM

## 2012-06-12 DIAGNOSIS — Z86718 Personal history of other venous thrombosis and embolism: Secondary | ICD-10-CM | POA: Diagnosis not present

## 2012-06-12 DIAGNOSIS — F329 Major depressive disorder, single episode, unspecified: Secondary | ICD-10-CM | POA: Diagnosis present

## 2012-06-12 DIAGNOSIS — G8220 Paraplegia, unspecified: Secondary | ICD-10-CM | POA: Diagnosis present

## 2012-06-12 DIAGNOSIS — IMO0002 Reserved for concepts with insufficient information to code with codable children: Secondary | ICD-10-CM | POA: Diagnosis not present

## 2012-06-12 DIAGNOSIS — M4714 Other spondylosis with myelopathy, thoracic region: Secondary | ICD-10-CM | POA: Diagnosis present

## 2012-06-12 DIAGNOSIS — M8448XA Pathological fracture, other site, initial encounter for fracture: Secondary | ICD-10-CM | POA: Diagnosis not present

## 2012-06-12 DIAGNOSIS — M542 Cervicalgia: Secondary | ICD-10-CM | POA: Diagnosis not present

## 2012-06-12 DIAGNOSIS — Z8261 Family history of arthritis: Secondary | ICD-10-CM

## 2012-06-12 DIAGNOSIS — K219 Gastro-esophageal reflux disease without esophagitis: Secondary | ICD-10-CM | POA: Diagnosis present

## 2012-06-12 DIAGNOSIS — F411 Generalized anxiety disorder: Secondary | ICD-10-CM | POA: Diagnosis present

## 2012-06-12 HISTORY — PX: THORACIC DISCECTOMY: SHX6113

## 2012-06-12 SURGERY — THORACIC DISCECTOMY
Anesthesia: General | Site: Spine Thoracic | Wound class: Clean

## 2012-06-12 MED ORDER — BUPIVACAINE-EPINEPHRINE PF 0.5-1:200000 % IJ SOLN
INTRAMUSCULAR | Status: DC | PRN
  Administered 2012-06-12: 10 mL

## 2012-06-12 MED ORDER — HYDROMORPHONE 0.3 MG/ML IV SOLN
INTRAVENOUS | Status: DC
  Administered 2012-06-13: 02:00:00 via INTRAVENOUS
  Filled 2012-06-12: qty 25

## 2012-06-12 MED ORDER — DIPHENHYDRAMINE HCL 12.5 MG/5ML PO ELIX: 12.5000 mg | ORAL_SOLUTION | Freq: Four times a day (QID) | ORAL | Status: DC | PRN

## 2012-06-12 MED ORDER — GLYCOPYRROLATE 0.2 MG/ML IJ SOLN
INTRAMUSCULAR | Status: DC | PRN
  Administered 2012-06-12: 0.6 mg via INTRAVENOUS

## 2012-06-12 MED ORDER — ONDANSETRON HCL 4 MG/2ML IJ SOLN
INTRAMUSCULAR | Status: DC | PRN
  Administered 2012-06-12: 4 mg via INTRAVENOUS

## 2012-06-12 MED ORDER — PROPOFOL 10 MG/ML IV BOLUS
INTRAVENOUS | Status: DC | PRN
  Administered 2012-06-12: 170 mg via INTRAVENOUS

## 2012-06-12 MED ORDER — ONDANSETRON HCL 4 MG/2ML IJ SOLN: 4.0000 mg | INTRAMUSCULAR | Status: DC | PRN

## 2012-06-12 MED ORDER — HEMOSTATIC AGENTS (NO CHARGE) OPTIME
TOPICAL | Status: DC | PRN
  Administered 2012-06-12: 1 via TOPICAL

## 2012-06-12 MED ORDER — NEOSTIGMINE METHYLSULFATE 1 MG/ML IJ SOLN
INTRAMUSCULAR | Status: DC | PRN
  Administered 2012-06-12: 4 mg via INTRAVENOUS

## 2012-06-12 MED ORDER — OXYCODONE HCL 5 MG PO TABS
10.0000 mg | ORAL_TABLET | ORAL | Status: DC | PRN
  Administered 2012-06-12 – 2012-06-17 (×14): 10 mg via ORAL
  Filled 2012-06-12 (×14): qty 2

## 2012-06-12 MED ORDER — LACTATED RINGERS IV SOLN
INTRAVENOUS | Status: DC | PRN
  Administered 2012-06-12 (×2): via INTRAVENOUS

## 2012-06-12 MED ORDER — DEXMEDETOMIDINE HCL IN NACL 200 MCG/50ML IV SOLN: INTRAVENOUS | Status: DC | PRN

## 2012-06-12 MED ORDER — SODIUM CHLORIDE 0.9 % IV SOLN
INTRAVENOUS | Status: DC
  Administered 2012-06-12 – 2012-06-13 (×2): via INTRAVENOUS
  Administered 2012-06-14: 1000 mL via INTRAVENOUS
  Administered 2012-06-15 – 2012-06-16 (×3): via INTRAVENOUS

## 2012-06-12 MED ORDER — MENTHOL 3 MG MT LOZG: 1.0000 | LOZENGE | OROMUCOSAL | Status: DC | PRN

## 2012-06-12 MED ORDER — LIDOCAINE HCL (CARDIAC) 20 MG/ML IV SOLN
INTRAVENOUS | Status: DC | PRN
  Administered 2012-06-12: 40 mg via INTRAVENOUS

## 2012-06-12 MED ORDER — ARTIFICIAL TEARS OP OINT
TOPICAL_OINTMENT | OPHTHALMIC | Status: DC | PRN
  Administered 2012-06-12: 1 via OPHTHALMIC

## 2012-06-12 MED ORDER — SODIUM CHLORIDE 0.9 % IJ SOLN: 9.0000 mL | INTRAMUSCULAR | Status: DC | PRN

## 2012-06-12 MED ORDER — HYDROMORPHONE 0.3 MG/ML IV SOLN
INTRAVENOUS | Status: AC
  Administered 2012-06-12: 20:00:00
  Filled 2012-06-12: qty 25

## 2012-06-12 MED ORDER — OXYCODONE HCL 5 MG PO TABS
ORAL_TABLET | ORAL | Status: AC
  Filled 2012-06-12: qty 2

## 2012-06-12 MED ORDER — SODIUM CHLORIDE 0.9 % IR SOLN
Status: DC | PRN
  Administered 2012-06-12: 1000 mL

## 2012-06-12 MED ORDER — DIAZEPAM 5 MG PO TABS
5.0000 mg | ORAL_TABLET | Freq: Four times a day (QID) | ORAL | Status: DC | PRN
  Administered 2012-06-12 – 2012-06-14 (×5): 5 mg via ORAL
  Filled 2012-06-12 (×4): qty 1

## 2012-06-12 MED ORDER — PHENOL 1.4 % MT LIQD: 1.0000 | OROMUCOSAL | Status: DC | PRN

## 2012-06-12 MED ORDER — DIPHENHYDRAMINE HCL 50 MG/ML IJ SOLN: 12.5000 mg | Freq: Four times a day (QID) | INTRAMUSCULAR | Status: DC | PRN

## 2012-06-12 MED ORDER — DEXMEDETOMIDINE HCL IN NACL 200 MCG/50ML IV SOLN
INTRAVENOUS | Status: DC | PRN
  Administered 2012-06-12: 0.4 ug/kg/h via INTRAVENOUS

## 2012-06-12 MED ORDER — DIAZEPAM 5 MG PO TABS
ORAL_TABLET | ORAL | Status: AC
  Filled 2012-06-12: qty 1

## 2012-06-12 MED ORDER — CEFAZOLIN SODIUM-DEXTROSE 2-3 GM-% IV SOLR
INTRAVENOUS | Status: AC
  Administered 2012-06-12: 2 g via INTRAVENOUS
  Filled 2012-06-12: qty 50

## 2012-06-12 MED ORDER — EPHEDRINE SULFATE 50 MG/ML IJ SOLN
INTRAMUSCULAR | Status: DC | PRN
  Administered 2012-06-12: 10 mg via INTRAVENOUS

## 2012-06-12 MED ORDER — MIDAZOLAM HCL 5 MG/5ML IJ SOLN
INTRAMUSCULAR | Status: DC | PRN
  Administered 2012-06-12: 2 mg via INTRAVENOUS

## 2012-06-12 MED ORDER — NALOXONE HCL 0.4 MG/ML IJ SOLN: 0.4000 mg | INTRAMUSCULAR | Status: DC | PRN

## 2012-06-12 MED ORDER — SODIUM CHLORIDE 0.9 % IV SOLN: 250.0000 mL | INTRAVENOUS | Status: DC

## 2012-06-12 MED ORDER — SODIUM CHLORIDE 0.9 % IJ SOLN: 3.0000 mL | INTRAMUSCULAR | Status: DC | PRN

## 2012-06-12 MED ORDER — OXYCODONE-ACETAMINOPHEN 5-325 MG PO TABS: 1.0000 | ORAL_TABLET | ORAL | Status: DC | PRN

## 2012-06-12 MED ORDER — TRAZODONE HCL 100 MG PO TABS
100.0000 mg | ORAL_TABLET | Freq: Every day | ORAL | Status: DC
  Administered 2012-06-12 – 2012-06-16 (×5): 100 mg via ORAL
  Filled 2012-06-12 (×6): qty 1

## 2012-06-12 MED ORDER — LEVOTHYROXINE SODIUM 200 MCG PO TABS
200.0000 ug | ORAL_TABLET | Freq: Every day | ORAL | Status: DC
  Administered 2012-06-13 – 2012-06-17 (×5): 200 ug via ORAL
  Filled 2012-06-12 (×6): qty 1

## 2012-06-12 MED ORDER — HYDROMORPHONE HCL PF 1 MG/ML IJ SOLN
INTRAMUSCULAR | Status: AC
  Filled 2012-06-12: qty 1

## 2012-06-12 MED ORDER — ACETAMINOPHEN 10 MG/ML IV SOLN: 1000.0000 mg | Freq: Once | INTRAVENOUS | Status: DC | PRN

## 2012-06-12 MED ORDER — CEFAZOLIN SODIUM 1-5 GM-% IV SOLN
1.0000 g | Freq: Three times a day (TID) | INTRAVENOUS | Status: AC
  Administered 2012-06-12 – 2012-06-13 (×2): 1 g via INTRAVENOUS
  Filled 2012-06-12 (×2): qty 50

## 2012-06-12 MED ORDER — ALENDRONATE SODIUM 70 MG PO TABS
70.0000 mg | ORAL_TABLET | ORAL | Status: DC
  Filled 2012-06-12: qty 1

## 2012-06-12 MED ORDER — ONDANSETRON HCL 4 MG/2ML IJ SOLN: 4.0000 mg | Freq: Once | INTRAMUSCULAR | Status: DC | PRN

## 2012-06-12 MED ORDER — SIMVASTATIN 20 MG PO TABS
20.0000 mg | ORAL_TABLET | Freq: Every day | ORAL | Status: DC
  Administered 2012-06-12 – 2012-06-16 (×4): 20 mg via ORAL
  Filled 2012-06-12 (×6): qty 1

## 2012-06-12 MED ORDER — HYDROMORPHONE HCL PF 1 MG/ML IJ SOLN
0.2500 mg | INTRAMUSCULAR | Status: DC | PRN
  Administered 2012-06-12 (×4): 0.5 mg via INTRAVENOUS

## 2012-06-12 MED ORDER — FENTANYL CITRATE 0.05 MG/ML IJ SOLN
INTRAMUSCULAR | Status: DC | PRN
  Administered 2012-06-12 (×3): 50 ug via INTRAVENOUS
  Administered 2012-06-12: 100 ug via INTRAVENOUS

## 2012-06-12 MED ORDER — MORPHINE SULFATE 2 MG/ML IJ SOLN
1.0000 mg | INTRAMUSCULAR | Status: DC | PRN
Start: 2012-06-12 — End: 2012-06-14
  Administered 2012-06-12 (×2): 2 mg via INTRAVENOUS
  Administered 2012-06-13 – 2012-06-14 (×2): 4 mg via INTRAVENOUS
  Filled 2012-06-12: qty 2
  Filled 2012-06-12: qty 1
  Filled 2012-06-12: qty 2
  Filled 2012-06-12: qty 1

## 2012-06-12 MED ORDER — SODIUM CHLORIDE 0.9 % IJ SOLN
3.0000 mL | Freq: Two times a day (BID) | INTRAMUSCULAR | Status: DC
  Administered 2012-06-15: 3 mL via INTRAVENOUS

## 2012-06-12 MED ORDER — THROMBIN 5000 UNITS EX KIT
PACK | CUTANEOUS | Status: DC | PRN
  Administered 2012-06-12 (×2): 5000 [IU] via TOPICAL

## 2012-06-12 MED ORDER — ROCURONIUM BROMIDE 100 MG/10ML IV SOLN
INTRAVENOUS | Status: DC | PRN
  Administered 2012-06-12: 10 mg via INTRAVENOUS
  Administered 2012-06-12: 50 mg via INTRAVENOUS

## 2012-06-12 MED ORDER — ONDANSETRON HCL 4 MG/2ML IJ SOLN: 4.0000 mg | Freq: Four times a day (QID) | INTRAMUSCULAR | Status: DC | PRN

## 2012-06-12 SURGICAL SUPPLY — 63 items
ADH SKN CLS LQ APL DERMABOND (GAUZE/BANDAGES/DRESSINGS) ×1
APL SKNCLS STERI-STRIP NONHPOA (GAUZE/BANDAGES/DRESSINGS)
BENZOIN TINCTURE PRP APPL 2/3 (GAUZE/BANDAGES/DRESSINGS) IMPLANT
BLADE SURG ROTATE 9660 (MISCELLANEOUS) IMPLANT
BUR ACORN 6.0 (BURR) ×2 IMPLANT
BUR MATCHSTICK NEURO 3.0 LAGG (BURR) ×1 IMPLANT
CANISTER SUCTION 2500CC (MISCELLANEOUS) ×2 IMPLANT
CLOTH BEACON ORANGE TIMEOUT ST (SAFETY) ×2 IMPLANT
CONT SPEC 4OZ CLIKSEAL STRL BL (MISCELLANEOUS) ×2 IMPLANT
DERMABOND ADHESIVE PROPEN (GAUZE/BANDAGES/DRESSINGS) ×1
DERMABOND ADVANCED .7 DNX6 (GAUZE/BANDAGES/DRESSINGS) IMPLANT
DRAPE LAPAROTOMY 100X72X124 (DRAPES) ×2 IMPLANT
DRAPE MICROSCOPE LEICA (MISCELLANEOUS) ×1 IMPLANT
DRAPE POUCH INSTRU U-SHP 10X18 (DRAPES) ×2 IMPLANT
DRSG PAD ABDOMINAL 8X10 ST (GAUZE/BANDAGES/DRESSINGS) ×1 IMPLANT
DURAFORM COLLAGEN 1X1 5-PACK (Neuro Prosthesis/Implant) ×1 IMPLANT
DURAPREP 26ML APPLICATOR (WOUND CARE) ×2 IMPLANT
ELECT REM PT RETURN 9FT ADLT (ELECTROSURGICAL) ×2
ELECTRODE REM PT RTRN 9FT ADLT (ELECTROSURGICAL) ×1 IMPLANT
GAUZE SPONGE 4X4 16PLY XRAY LF (GAUZE/BANDAGES/DRESSINGS) ×1 IMPLANT
GLOVE BIO SURGEON STRL SZ8 (GLOVE) ×1 IMPLANT
GLOVE BIOGEL M 8.0 STRL (GLOVE) ×2 IMPLANT
GLOVE BIOGEL PI IND STRL 8.5 (GLOVE) IMPLANT
GLOVE BIOGEL PI INDICATOR 8.5 (GLOVE) ×2
GLOVE EXAM NITRILE LRG STRL (GLOVE) IMPLANT
GLOVE EXAM NITRILE MD LF STRL (GLOVE) IMPLANT
GLOVE EXAM NITRILE XL STR (GLOVE) IMPLANT
GLOVE EXAM NITRILE XS STR PU (GLOVE) IMPLANT
GLOVE SURG SS PI 8.0 STRL IVOR (GLOVE) ×3 IMPLANT
GOWN BRE IMP SLV AUR LG STRL (GOWN DISPOSABLE) ×2 IMPLANT
GOWN BRE IMP SLV AUR XL STRL (GOWN DISPOSABLE) ×1 IMPLANT
GOWN STRL REIN 2XL LVL4 (GOWN DISPOSABLE) ×2 IMPLANT
KIT BASIN OR (CUSTOM PROCEDURE TRAY) ×2 IMPLANT
KIT ROOM TURNOVER OR (KITS) ×2 IMPLANT
NDL HYPO 18GX1.5 BLUNT FILL (NEEDLE) IMPLANT
NDL HYPO 21X1.5 SAFETY (NEEDLE) IMPLANT
NDL SPNL 20GX3.5 QUINCKE YW (NEEDLE) IMPLANT
NEEDLE HYPO 18GX1.5 BLUNT FILL (NEEDLE) IMPLANT
NEEDLE HYPO 21X1.5 SAFETY (NEEDLE) IMPLANT
NEEDLE HYPO 25X1 1.5 SAFETY (NEEDLE) ×2 IMPLANT
NEEDLE SPNL 20GX3.5 QUINCKE YW (NEEDLE) ×4 IMPLANT
NS IRRIG 1000ML POUR BTL (IV SOLUTION) ×2 IMPLANT
PACK LAMINECTOMY NEURO (CUSTOM PROCEDURE TRAY) ×2 IMPLANT
PAD ARMBOARD 7.5X6 YLW CONV (MISCELLANEOUS) ×8 IMPLANT
PATTIES SURGICAL .5 X1 (DISPOSABLE) ×2 IMPLANT
RUBBERBAND STERILE (MISCELLANEOUS) ×4 IMPLANT
SPONGE GAUZE 4X4 12PLY (GAUZE/BANDAGES/DRESSINGS) ×2 IMPLANT
SPONGE LAP 4X18 X RAY DECT (DISPOSABLE) IMPLANT
SPONGE SURGIFOAM ABS GEL SZ50 (HEMOSTASIS) ×2 IMPLANT
STRIP CLOSURE SKIN 1/2X4 (GAUZE/BANDAGES/DRESSINGS) ×1 IMPLANT
SUT ETHILON 2 0 FS 18 (SUTURE) ×2 IMPLANT
SUT PROLENE 6 0 BV (SUTURE) ×2 IMPLANT
SUT VIC AB 0 CT1 18XCR BRD8 (SUTURE) ×1 IMPLANT
SUT VIC AB 0 CT1 8-18 (SUTURE) ×4
SUT VIC AB 2-0 CP2 18 (SUTURE) ×2 IMPLANT
SUT VIC AB 3-0 SH 8-18 (SUTURE) ×2 IMPLANT
SYR 20CC LL (SYRINGE) IMPLANT
SYR 20ML ECCENTRIC (SYRINGE) ×2 IMPLANT
SYR 5ML LL (SYRINGE) IMPLANT
TAPE CLOTH SURG 4X10 WHT LF (GAUZE/BANDAGES/DRESSINGS) ×1 IMPLANT
TOWEL OR 17X24 6PK STRL BLUE (TOWEL DISPOSABLE) ×2 IMPLANT
TOWEL OR 17X26 10 PK STRL BLUE (TOWEL DISPOSABLE) ×2 IMPLANT
WATER STERILE IRR 1000ML POUR (IV SOLUTION) ×2 IMPLANT

## 2012-06-12 NOTE — H&P (Signed)
Katelyn Rowland is an 71 y.o. female.   Chief Complaint: upper lumbar pain HPI: patient with several months history of upper lumbar pain which gets worse with ambulation.had mri and send to [place] for treatment  Past Medical History  Diagnosis Date  . Arthritis   . Edema of both legs   . Neck pain   . Thyroid disorder   . Reflux   . Depression   . Anxiety   . 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 Hematoma of leg     left   . PONV (postoperative nausea and vomiting)   . Headache   . Hypertension     margaret simpson  [id number]  . High blood pressure   . GERD (gastroesophageal reflux disease)   . Muscle cramps   . Diabetes mellitus 2009    no meds    Past Surgical History  Procedure Date  . Tonsillectomy   . Tonsillectomy and adenoidectomy   . Cervical spine surgery   . Vesicovaginal fistula closure w/ tah   . Unilateal oopherectomy   . Appendectomy   . Foot neuroma surgery     right   . Nasal sinus surgery   . Ivc filter placement, following recurrent left dvt 07/2009    Family History  Problem Relation Age of Onset  . Emphysema Sister   . Diabetes Mother   . Diabetes Father   . Arthritis    . Kidney disease     Social History:  reports that she quit smoking about 2 years ago. Her smoking use included Cigarettes. She does not have any smokeless tobacco history on file. She reports that she does not drink alcohol or use illicit drugs.  Allergies:  Allergies  Allergen Reactions  . Prednisone     ONLY THE CORTISONE INJECTIONS  . Acetaminophen   . Warfarin Sodium     REACTION: pt devloped lrge hematoma onleg while on coumadin    Medications Prior to Admission  Medication Sig Dispense Refill  . alendronate (FOSAMAX) 70 MG tablet Take 1 tablet (70 mg total) by mouth every 7 (seven) days. Take with a full glass of water on an empty stomach.  4 tablet  11  . calcium-vitamin D (OSCAL 500/200 D-3) 500-200 MG-UNIT per  tablet Take 1 tablet by mouth 3 (three) times daily.  90 tablet  11  . gabapentin (NEURONTIN) 100 MG capsule TAKE (1) CAPSULE BY MOUTH AT BEDTIME.  30 capsule  4  . levothyroxine (SYNTHROID) 200 MCG tablet Take 1 tablet (200 mcg total) by mouth daily.  30 tablet  11  . LOTENSIN 40 MG tablet TAKE (1) TABLET BY MOUTH DAILY.  30 each  3  . methadone (DOLOPHINE) 10 MG tablet Take 1 tablet (10 mg total) by mouth every 6 (six) hours.  120 tablet  0  . MEVACOR 20 MG tablet TAKE ONE TABLET BY MOUTH AT BEDTIME ON MONDAY, WEDNESDAY, AND FRIDAY.  30 tablet  2  . NEXIUM 40 MG capsule TAKE ONE CAPSULE BY MOUTH DAILY  90 capsule  1  . nystatin (NYSTOP) 100000 UNIT/GM POWD APPLY TO AFFECTED AREAS TWICE DAILY.  60 g  2  . polyethylene glycol powder (MIRALAX) powder Take one capful with 8oz of water once daily Take 17 g by mouth. Take one capful with 8oz of water once daily  255 g  4  . promethazine (PHENERGAN) 25 MG tablet Take 25 mg by  mouth every 6 (six) hours as needed. For nausea      . traZODone (DESYREL) 100 MG tablet Take 100 mg by mouth at bedtime.        Results for orders placed during the hospital encounter of 06/12/12 (from the past 48 hour(s))  GLUCOSE, CAPILLARY     Status: Normal   Collection Time   06/12/12  6:37 AM      Component Value Range Comment   Glucose-Capillary 84  70 - 99 mg/dL    No results found.  Review of Systems  Constitutional: Negative.   HENT: Negative.   Eyes: Negative.   Respiratory: Negative.   Cardiovascular: Negative.   Gastrointestinal: Negative.   Genitourinary: Negative.   Musculoskeletal: Positive for back pain and joint pain.  Skin: Negative.   Neurological: Positive for focal weakness.  Endo/Heme/Allergies: Bruises/bleeds easily.  Psychiatric/Behavioral: Negative.     Blood pressure 178/83, pulse 60, temperature 97.5 F (36.4 C), temperature source Oral, resp. rate 18, SpO2 98.00%. Physical Exam hent,nl. Neck  scar anterior. Lungs, clear. Cv, nl.  Abdomen,soft. Extremities grade 1 edema. NEURO strength nl. Sensory nl. SLR negative mri fracture of l1. Some stenosis at l4-5 BUT atthoracic10--11 she has a severe stenosis with the canal being 4 mm wide  Assessment/Plan To have t10-11 thoracic laminectomies. She is aware of risks and benefits  Tyisha Cressy M 06/12/2012, 8:40 AM

## 2012-06-12 NOTE — Anesthesia Postprocedure Evaluation (Signed)
  Anesthesia Post-op Note  Patient: Katelyn Rowland  Procedure(s) Performed: Procedure(s) (LRB) with comments: THORACIC DISCECTOMY (N/A) - Thoracic Ten-Eleven Thoracic Laminectomy  Patient Location: PACU  Anesthesia Type:General  Level of Consciousness: awake, alert  and oriented  Airway and Oxygen Therapy: Patient Spontanous Breathing and Patient connected to nasal cannula oxygen  Post-op Pain: mild  Post-op Assessment: Post-op Vital signs reviewed and Patient's Cardiovascular Status Stable  Post-op Vital Signs: stable  Complications: No apparent anesthesia complications

## 2012-06-12 NOTE — Anesthesia Procedure Notes (Signed)
Procedure Name: Intubation Date/Time: 06/12/2012 8:57 AM Performed by: Kyung Rudd Pre-anesthesia Checklist: Patient identified, Emergency Drugs available, Suction available, Patient being monitored and Timeout performed Preoxygenation: Pre-oxygenation with 100% oxygen Intubation Type: IV induction Ventilation: Mask ventilation without difficulty and Oral airway inserted - appropriate to patient size Laryngoscope Size: Mac and 3 Grade View: Grade I Tube type: Oral Tube size: 7.5 mm Number of attempts: 1 Airway Equipment and Method: Stylet Placement Confirmation: ETT inserted through vocal cords under direct vision,  positive ETCO2 and breath sounds checked- equal and bilateral Secured at: 21 cm Tube secured with: Tape Dental Injury: Teeth and Oropharynx as per pre-operative assessment

## 2012-06-12 NOTE — Progress Notes (Signed)
Pt pain remains high despite efforts to control. Dr. Ronnald Ramp called; received order for low dose dilaudid pca and foley d/t Pt remaining on back per Dr. Joya Salm.

## 2012-06-12 NOTE — Progress Notes (Signed)
Called Dr.Botero this am at 0615, orders not signed. Stated he will sign them we he arrives to hospital. Therefore, consent will need to be signed in holding.

## 2012-06-12 NOTE — Transfer of Care (Signed)
Immediate Anesthesia Transfer of Care Note  Patient: Katelyn Rowland  Procedure(s) Performed: Procedure(s) (LRB) with comments: THORACIC DISCECTOMY (N/A) - Thoracic Ten-Eleven Thoracic Laminectomy  Patient Location: PACU  Anesthesia Type:General  Level of Consciousness: awake, alert  and oriented  Airway & Oxygen Therapy: Patient Spontanous Breathing and Patient connected to face mask oxygen  Post-op Assessment: Report given to PACU RN, Post -op Vital signs reviewed and stable and Patient moving all extremities X 4  Post vital signs: Reviewed and stable  Complications: No apparent anesthesia complications

## 2012-06-12 NOTE — Progress Notes (Signed)
Dr. Joya Salm notified of an elevated area on the right side just outside the dressing, area marked. He ordered to change dressing and let pt. stay on her back for 24 hrs. Order implemented.

## 2012-06-12 NOTE — Progress Notes (Signed)
PHARMACIST - PHYSICIAN COMMUNICATION  CONCERNING: P&T Medication Policy Regarding Oral Bisphosphonates  RECOMMENDATION: Your order for alendronate (Fosamax), ibandronate (Boniva), or risedronate (Actonel) has been discontinued at this time.  If the patient's post-hospital medical condition warrants safe use of this class of drugs, please resume the pre-hospital regimen upon discharge.  DESCRIPTION:  Alendronate (Fosamax), ibandronate (Boniva), and risedronate (Actonel) can cause severe esophageal erosions in patients who are unable to remain upright at least 30 minutes after taking this medication.   Since brief interruptions in therapy are thought to have minimal impact on bone mineral density, the Pharmacy & Therapeutics Committee has established that bisphosphonate orders should be routinely discontinued during hospitalization.   To override this safety policy and permit administration of Boniva, Fosamax, or Actonel in the hospital, prescribers must write "DO NOT HOLD" in the comments section when placing the order for this class of medications.  PHARMACIST - PHYSICIAN COMMUNICATION  CONCERNING: P&T Medication Policy Regarding Oral Bisphosphonates  RECOMMENDATION: Your order for alendronate (Fosamax), ibandronate (Boniva), or risedronate (Actonel) has been discontinued at this time.  If the patient's post-hospital medical condition warrants safe use of this class of drugs, please resume the pre-hospital regimen upon discharge.  DESCRIPTION:  Alendronate (Fosamax), ibandronate (Boniva), and risedronate (Actonel) can cause severe esophageal erosions in patients who are unable to remain upright at least 30 minutes after taking this medication.   Since brief interruptions in therapy are thought to have minimal impact on bone mineral density, the Pharmacy & Therapeutics Committee has established that bisphosphonate orders should be routinely discontinued during hospitalization.    To override this safety policy and permit administration of Boniva, Fosamax, or Actonel in the hospital, prescribers must write "DO NOT HOLD" in the comments section when placing the order for this class of medications.   

## 2012-06-12 NOTE — Progress Notes (Signed)
Op note [phone]

## 2012-06-12 NOTE — Preoperative (Signed)
Beta Blockers   Reason not to administer Beta Blockers:Not Applicable 

## 2012-06-12 NOTE — Anesthesia Preprocedure Evaluation (Addendum)
Anesthesia Evaluation  Patient identified by MRN, date of birth, ID band Patient awake    Reviewed: Allergy & Precautions, H&P , NPO status , Patient's Chart, lab work & pertinent test results  History of Anesthesia Complications (+) PONV  Airway Mallampati: II      Dental  (+) Edentulous Upper and Edentulous Lower   Pulmonary  breath sounds clear to auscultation        Cardiovascular hypertension, Pt. on medications Rhythm:Regular Rate:Normal     Neuro/Psych  Headaches, Anxiety Depression  Neuromuscular disease    GI/Hepatic GERD-  Medicated and Controlled,  Endo/Other  neg diabetesHypothyroidism   Renal/GU      Musculoskeletal   Abdominal   Peds  Hematology   Anesthesia Other Findings   Reproductive/Obstetrics                         Anesthesia Physical Anesthesia Plan  ASA: III  Anesthesia Plan: General   Post-op Pain Management:    Induction: Intravenous  Airway Management Planned: Oral ETT  Additional Equipment:   Intra-op Plan:   Post-operative Plan: Extubation in OR  Informed Consent: I have reviewed the patients History and Physical, chart, labs and discussed the procedure including the risks, benefits and alternatives for the proposed anesthesia with the patient or authorized representative who has indicated his/her understanding and acceptance.   Dental advisory given  Plan Discussed with: CRNA and Anesthesiologist  Anesthesia Plan Comments: (HNP T10-11 Chronic pain on methadone Anxiety GERD  Plan GA with oral ETT)       Anesthesia Quick Evaluation

## 2012-06-13 MED ORDER — DIPHENHYDRAMINE HCL 50 MG/ML IJ SOLN: 12.5000 mg | Freq: Four times a day (QID) | INTRAMUSCULAR | Status: DC | PRN

## 2012-06-13 MED ORDER — KETOROLAC TROMETHAMINE 30 MG/ML IJ SOLN
30.0000 mg | Freq: Once | INTRAMUSCULAR | Status: AC
  Administered 2012-06-13: 30 mg via INTRAVENOUS
  Filled 2012-06-13: qty 1

## 2012-06-13 MED ORDER — DIPHENHYDRAMINE HCL 12.5 MG/5ML PO ELIX: 12.5000 mg | ORAL_SOLUTION | Freq: Four times a day (QID) | ORAL | Status: DC | PRN

## 2012-06-13 MED ORDER — NALOXONE HCL 0.4 MG/ML IJ SOLN: 0.4000 mg | INTRAMUSCULAR | Status: DC | PRN

## 2012-06-13 MED ORDER — ALPRAZOLAM 0.5 MG PO TABS
0.5000 mg | ORAL_TABLET | Freq: Once | ORAL | Status: AC
  Administered 2012-06-13: 0.5 mg via ORAL
  Filled 2012-06-13: qty 1

## 2012-06-13 MED ORDER — HYDROMORPHONE 0.3 MG/ML IV SOLN
INTRAVENOUS | Status: DC
  Administered 2012-06-13: 3.6 mg via INTRAVENOUS
  Administered 2012-06-13: 4.8 mg via INTRAVENOUS
  Administered 2012-06-13: 10:00:00 via INTRAVENOUS
  Administered 2012-06-13: 5.1 mg via INTRAVENOUS
  Administered 2012-06-13 – 2012-06-14 (×2): via INTRAVENOUS
  Administered 2012-06-14: 7.5 mg via INTRAVENOUS
  Administered 2012-06-14: 2.4 mg via INTRAVENOUS
  Filled 2012-06-13 (×4): qty 25

## 2012-06-13 MED ORDER — ONDANSETRON HCL 4 MG/2ML IJ SOLN: 4.0000 mg | Freq: Four times a day (QID) | INTRAMUSCULAR | Status: DC | PRN

## 2012-06-13 MED ORDER — SODIUM CHLORIDE 0.9 % IJ SOLN: 9.0000 mL | INTRAMUSCULAR | Status: DC | PRN

## 2012-06-13 MED ORDER — METHADONE HCL 10 MG PO TABS
5.0000 mg | ORAL_TABLET | Freq: Four times a day (QID) | ORAL | Status: DC | PRN
  Administered 2012-06-13 – 2012-06-14 (×2): 5 mg via ORAL
  Filled 2012-06-13 (×2): qty 1

## 2012-06-13 NOTE — Progress Notes (Signed)
UR COMPLETED  

## 2012-06-13 NOTE — Progress Notes (Signed)
Dr. Joya Salm called with verbal orders for patient to lay on back for 24 hours to reduce swelling and to use ice packs on patient's back. Patient explained rationale for these measures but refuses to lay flat on back through evening because her hip and back pain is too intense in that position. Ice is applied.

## 2012-06-13 NOTE — Progress Notes (Signed)
Patient continues to be in 9 or 10 out of 10 pain throughout the evening. Repositioning, ice, valium unsuccessful. Dr. Ronnald Ramp notified that the reduced dose PCA was not effective. Dr. Ronnald Ramp increased PCA to full dose and ordered ketorolac for patient's pain. Will continue to monitor patient.

## 2012-06-13 NOTE — Progress Notes (Signed)
PT Cancellation Note  Patient Details Name: Katelyn Rowland MRN: [id number] DOB: 04-18-1941   Cancelled Treatment:    Reason Eval/Treat Not Completed: Other (comment) (pt on 24 hr bedrest). According to Dr. Joya Salm, keep pt on bedrest for 24 hrs post surgery to reduce swelling. Will attempt eval tomorrow pending medical stability   Ambrose Finland 06/13/2012, 8:17 AM

## 2012-06-13 NOTE — Op Note (Signed)
NAMETEQUILA, ESCOVAR NO.:  [id number]  MEDICAL RECORD NO.:  [id number]  LOCATION:  [id number]                        FACILITY:  [facility]  PHYSICIAN:  Leeroy Cha, M.D.   DATE OF BIRTH:  01/13/1941  DATE OF PROCEDURE:  06/12/2012 DATE OF DISCHARGE:                              OPERATIVE REPORT   PREOPERATIVE DIAGNOSES:  Thoracic stenosis with early paraparesis, thoracic radiculopathy.  POSTOPERATIVE DIAGNOSES:  Thoracic stenosis with early paraparesis, thoracic radiculopathy.  PROCEDURE:  Bilateral thoracic 10-11 laminectomy, decompression of the spinal cord, removal of exostosis attached to the dura mater.  SURGEON:  Leeroy Cha, M.D.  ASSISTANT:  Eustace Moore, MD  CLINICAL HISTORY:  The patient was seen in my office because of back pain radiation to both legs associated with some weakness.  X-rays showed that she has severe stenosis at the level of thoracic 10 and 11. Surgery was advised.  The risk was fully explained to her and her family.  PROCEDURE:  The patient was taken to the OR, and after intubation, she was positioned in a prone manner.  The thoracolumbar area was cleaned with DuraPrep.  Drapes were applied.  X-rays showed that one needle was at the level of T12 and the other was at around T10-T11.  From then on, an incision was made in the midline through the skin, subcutaneous tissue retracting the muscle laterally.  We removed the spinous process of T10 and T11, and immediately what we found was the patient had quite a bit of stenosis.  At the level of T10, she has calcified ligament with the ligament being attached to the dura mater itself.  With the drill withdrew laterally and we will to decompress the area and using the 1 and 2-mm Kerrison punch, we were able to decompress the area.  This was done also using the microscope.  The calcification was attached to the dura mater.  It was difficult to remove by self and because I was  afraid that this will low back, I did the resection around the dura mater.  The dura mater was closed easily with two stitches of Prolene.  Valsalva maneuver was negative.  At the end, we had plenty of space of bone below the area of the dissection.  From then on, the wound was closed with Vicryl and nylon.  The patient did well.          ______________________________ Leeroy Cha, M.D.     EB/MEDQ  D:  06/12/2012  T:  06/13/2012  Job:  [id number]

## 2012-06-13 NOTE — Progress Notes (Signed)
06/13/2012 Nestor Lewandowsky, OTR/L Pager: [phone]

## 2012-06-13 NOTE — Progress Notes (Signed)
Vital signs wnl. No weakness. C/o of a lot of incisional pain. Iv dilaudid working. Wound dressing dry

## 2012-06-13 NOTE — Clinical Social Work Note (Signed)
Clinical Social Work  CSW received a consult for SNF. CSW reviewed chart and discussed pt during progression with RN. Awaiting PT/OT evals for discharge recommendations. CSW will assess for SNF, if appropriate. Please call with any urgent needs.   Darden Dates, MSW, [place]

## 2012-06-14 ENCOUNTER — Encounter (HOSPITAL_COMMUNITY): Payer: Self-pay | Admitting: Neurosurgery

## 2012-06-14 MED ORDER — NALOXONE HCL 0.4 MG/ML IJ SOLN: 0.4000 mg | INTRAMUSCULAR | Status: DC | PRN

## 2012-06-14 MED ORDER — HYDROMORPHONE 0.3 MG/ML IV SOLN
INTRAVENOUS | Status: DC
  Administered 2012-06-14: 1.99 mg via INTRAVENOUS
  Administered 2012-06-14: 3.3 mg via INTRAVENOUS
  Administered 2012-06-15: 0.2 mg via INTRAVENOUS
  Administered 2012-06-15: 1.99 mg via INTRAVENOUS
  Administered 2012-06-15: 0.8 mg via INTRAVENOUS
  Administered 2012-06-15: 0.59 mg via INTRAVENOUS
  Administered 2012-06-15: 1.19 mg via INTRAVENOUS
  Administered 2012-06-15: 1.59 mg via INTRAVENOUS
  Administered 2012-06-16: 0.4 mg via INTRAVENOUS
  Administered 2012-06-16: 0.8 mg via INTRAVENOUS
  Administered 2012-06-16: 0.6 mg via INTRAVENOUS
  Administered 2012-06-16: 0.39 mg via INTRAVENOUS
  Administered 2012-06-16: 0.399 mg via INTRAVENOUS
  Filled 2012-06-14: qty 25

## 2012-06-14 MED ORDER — DEXAMETHASONE SODIUM PHOSPHATE 4 MG/ML IJ SOLN
4.0000 mg | Freq: Four times a day (QID) | INTRAMUSCULAR | Status: DC
  Administered 2012-06-14 – 2012-06-17 (×11): 4 mg via INTRAVENOUS
  Filled 2012-06-14 (×19): qty 1

## 2012-06-14 MED ORDER — ONDANSETRON HCL 4 MG/2ML IJ SOLN: 4.0000 mg | Freq: Four times a day (QID) | INTRAMUSCULAR | Status: DC | PRN

## 2012-06-14 MED ORDER — METHADONE HCL 10 MG PO TABS
10.0000 mg | ORAL_TABLET | Freq: Four times a day (QID) | ORAL | Status: DC | PRN
  Administered 2012-06-14 – 2012-06-17 (×8): 10 mg via ORAL
  Filled 2012-06-14 (×8): qty 1

## 2012-06-14 MED ORDER — SODIUM CHLORIDE 0.9 % IJ SOLN: 9.0000 mL | INTRAMUSCULAR | Status: DC | PRN

## 2012-06-14 MED ORDER — DIPHENHYDRAMINE HCL 12.5 MG/5ML PO ELIX: 12.5000 mg | ORAL_SOLUTION | Freq: Four times a day (QID) | ORAL | Status: DC | PRN

## 2012-06-14 MED ORDER — KETOROLAC TROMETHAMINE 30 MG/ML IJ SOLN
30.0000 mg | Freq: Once | INTRAMUSCULAR | Status: AC
  Administered 2012-06-14: 30 mg via INTRAVENOUS
  Filled 2012-06-14: qty 1

## 2012-06-14 MED ORDER — ALPRAZOLAM 0.5 MG PO TABS
0.5000 mg | ORAL_TABLET | Freq: Four times a day (QID) | ORAL | Status: DC | PRN
  Administered 2012-06-14 – 2012-06-17 (×5): 0.5 mg via ORAL
  Filled 2012-06-14 (×5): qty 1

## 2012-06-14 MED ORDER — DIPHENHYDRAMINE HCL 50 MG/ML IJ SOLN: 12.5000 mg | Freq: Four times a day (QID) | INTRAMUSCULAR | Status: DC | PRN

## 2012-06-14 NOTE — Evaluation (Signed)
Occupational Therapy Evaluation Patient Details Name: Katelyn Rowland MRN: [id number] DOB: 07-19-1941 Today's Date: 06/14/2012 Time: [id number] OT Time Calculation (min): 22 min  OT Assessment / Plan / Recommendation Clinical Impression  Pt admitted for lumbar back surgery with deficits listed below and very low pain tolerance who would benefit from cont OT to maximize I with all adls so she can eventually return home.    OT Assessment  Patient needs continued OT Services    Follow Up Recommendations  SNF    Barriers to Discharge None;Decreased caregiver support unsure if someone can stay with pt 24/7  Equipment Recommendations  None recommended by OT    Recommendations for Other Services    Frequency  Min 2X/week    Precautions / Restrictions Precautions Precautions: Back;Fall Precaution Comments: Pt very focused on her pain.   Restrictions Weight Bearing Restrictions: No   Pertinent Vitals/Pain Pt with 10/10 pain that made participating in therapy almost impossible.  Pt very focused on her pain.    ADL  Eating/Feeding: Simulated;Set up Where Assessed - Eating/Feeding: Bed level Grooming: Simulated;Set up Where Assessed - Grooming: Supine, head of bed up Upper Body Bathing: Simulated;Set up Where Assessed - Upper Body Bathing: Supine, head of bed up Lower Body Bathing: Simulated;Maximal assistance Where Assessed - Lower Body Bathing: Rolling right and/or left Upper Body Dressing: Simulated;Moderate assistance Where Assessed - Upper Body Dressing: Supported sitting Lower Body Dressing: Simulated;+1 Total assistance Where Assessed - Lower Body Dressing: Supine, head of bed flat Toilet Transfer: Other (comment) (pt not willing to stand due to pain.) Transfers/Ambulation Related to ADLs: Pt unwilling to attempt standing.  Pt got to side of bed  with min assist, sat there for 10 seconds and said it was too painful and got herself back into bed without assist. ADL Comments: Pt  unwilling to attempt too many adls due to pain.  Feel she is able to physically participate in adls but pain is the first thing on her mind and therefore was not willing.    OT Diagnosis: Generalized weakness;Acute pain  OT Problem List: Decreased activity tolerance;Decreased knowledge of use of DME or AE;Decreased knowledge of precautions;Pain OT Treatment Interventions: Self-care/ADL training;DME and/or AE instruction   OT Goals Acute Rehab OT Goals OT Goal Formulation: With patient Time For Goal Achievement: 06/28/12 Potential to Achieve Goals: Good ADL Goals Pt Will Perform Grooming: with supervision;Standing at sink ADL Goal: Grooming - Progress: Goal set today Pt Will Perform Lower Body Bathing: with min assist;Sit to stand from chair;Standing at sink;Sitting at sink ADL Goal: Lower Body Bathing - Progress: Goal set today Pt Will Perform Lower Body Dressing: with min assist;Sit to stand from chair;with adaptive equipment ADL Goal: Lower Body Dressing - Progress: Goal set today Pt Will Perform Tub/Shower Transfer: Shower transfer;with min assist;Shower seat with back ADL Goal: Tub/Shower Transfer - Progress: Goal set today Additional ADL Goal #1: Pt will complete all aspects of toileting with min assist on comfort height commode. ADL Goal: Additional Goal #1 - Progress: Goal set today  Visit Information  Last OT Received On: 06/14/12 Assistance Needed: +1 PT/OT Co-Evaluation/Treatment: Yes    Subjective Data  Subjective: "You cannot bully me into doing what I don't want to do." Patient Stated Goal: get rid of the pain   Prior Functioning     Home Living Lives With: Alone Available Help at Discharge: Family;Friend(s);Available 24 hours/day Type of Home: House Home Access: Stairs to enter [employer] of Steps: 2 Entrance Stairs-Rails: None  Home Layout: One level Bathroom Shower/Tub: Multimedia programmer: Handicapped height Home Adaptive Equipment:  Walker - Audiological scientist Prior Function Level of Independence: Independent Able to Take Stairs?: Yes Driving: No Vocation: Retired Corporate investment banker: No difficulties Dominant Hand: Right         Vision/Perception Vision - Assessment Eye Alignment: Within Functional Limits Vision Assessment: Vision not tested   Cognition  Overall Cognitive Status: Appears within functional limits for tasks assessed/performed Arousal/Alertness: Awake/alert Orientation Level: Oriented X4 / Intact;Appears intact for tasks assessed Behavior During Session: Agitated Cognition - Other Comments: intact.    Extremity/Trunk Assessment Right Upper Extremity Assessment RUE ROM/Strength/Tone: Within functional levels RUE Sensation: WFL - Light Touch RUE Coordination: WFL - gross/fine motor Left Upper Extremity Assessment LUE ROM/Strength/Tone: Within functional levels LUE Sensation: WFL - Light Touch LUE Coordination: WFL - gross/fine motor     Mobility Bed Mobility Bed Mobility: Rolling Right;Rolling Left;Right Sidelying to Sit Rolling Right: 4: Min assist;Other (comment) (when asked she would not roll, at times rolled Ily.) Rolling Left: 4: Min assist;Other (comment) (when asked, stated she could not.  Did roll Ily at times.) Right Sidelying to Sit: 4: Min assist;HOB flat Details for Bed Mobility Assistance: When asked, pt stated she could not roll w/o assist and could not move.  Pt came supine to sit with min assist.  Once in sitting pt threw herself quickly back into sidelying (w/o assist) stating the pain was just too bad to sit.  Attempt two had the same result.  Pt was min to mod assist with bed mobility unless therapist could not get pt in comfortable position, then pt was able to roll Ily. Transfers Details for Transfer Assistance: did not perform transfers.  Pt refused to get up.     Shoulder Instructions     Exercise     Balance Balance Balance Assessed: No   End  of Session OT - End of Session Activity Tolerance: Patient limited by pain Patient left: in bed;with call bell/phone within reach Nurse Communication: Mobility status  GO     Glenford Peers 06/14/2012, 1:42 PM [phone]

## 2012-06-14 NOTE — Evaluation (Signed)
Physical Therapy Evaluation Patient Details Name: Katelyn Rowland MRN: [id number] DOB: 1940/08/12 Today's Date: 06/14/2012 Time: [id number] PT Time Calculation (min): 23 min  PT Assessment / Plan / Recommendation Clinical Impression  Pt is a 71 y.o. female s/p lumbar spine sx.  Pt presents with signigicantly decreased pain tolerance limiting ability to assess overall function.  Pt required assist for bed mobility.  Pt lives alone and has 2 steps to enter the home. Rec d/c to SNF upon discharge pending progress.    PT Assessment  Patient needs continued PT services    Follow Up Recommendations  SNF    Does the patient have the potential to tolerate intense rehabilitation      Barriers to Discharge Decreased caregiver support;Inaccessible home environment lives alone    Equipment Recommendations  None recommended by OT    Recommendations for Other Services     Frequency Min 3X/week    Precautions / Restrictions Precautions Precautions: Back;Fall Precaution Comments: Pt very focused on her pain.   Restrictions Weight Bearing Restrictions: No   Pertinent Vitals/Pain 10/10 pain severely limiting pt's ability to participate      Mobility  Bed Mobility Bed Mobility: Rolling Right;Rolling Left;Right Sidelying to Sit Rolling Right: 4: Min assist;Other (comment) (when asked she would not roll, at times rolled Ily.) Rolling Left: 4: Min assist;Other (comment) (when asked, stated she could not.  Did roll Ily at times.) Right Sidelying to Sit: 4: Min assist;HOB flat Details for Bed Mobility Assistance: When asked, pt stated she could not roll w/o assist and could not move.  Pt came supine to sit with min assist.  Once in sitting pt threw herself quickly back into sidelying (w/o assist) stating the pain was just too bad to sit.  Attempt two had the same result.  Pt was min to mod assist with bed mobility unless therapist could not get pt in comfortable position, then pt was able to roll  Ily. Transfers Transfers: Not assessed Details for Transfer Assistance: did not perform transfers.  Pt refused to get up. Ambulation/Gait Ambulation/Gait Assistance: Not tested (comment) (pt refused at this time)           PT Diagnosis: Acute pain;Other (comment) (unable to assess ambulation)  PT Problem List: Decreased strength;Decreased activity tolerance;Decreased mobility;Pain PT Treatment Interventions: DME instruction;Gait training;Stair training;Functional mobility training;Therapeutic activities;Therapeutic exercise;Patient/family education   PT Goals Acute Rehab PT Goals PT Goal Formulation: With patient Time For Goal Achievement: 06/19/12 Potential to Achieve Goals: Fair Pt will Roll Supine to Right Side: with modified independence PT Goal: Rolling Supine to Right Side - Progress: Goal set today Pt will Roll Supine to Left Side: with modified independence PT Goal: Rolling Supine to Left Side - Progress: Goal set today Pt will go Supine/Side to Sit: with modified independence PT Goal: Supine/Side to Sit - Progress: Goal set today Pt will go Sit to Stand: with modified independence PT Goal: Sit to Stand - Progress: Goal set today Pt will go Stand to Sit: with modified independence PT Goal: Stand to Sit - Progress: Goal set today Pt will Ambulate: >150 feet;with modified independence;with rolling walker PT Goal: Ambulate - Progress: Goal set today Pt will Go Up / Down Stairs: 3-5 stairs;with modified independence;with least restrictive assistive device PT Goal: Up/Down Stairs - Progress: Goal set today  Visit Information  Last PT Received On: 06/14/12 Assistance Needed: +1    Subjective Data  Subjective: I'm in a lot of pain Patient Stated Goal: to go home  Prior Functioning  Home Living Lives With: Alone Available Help at Discharge: Family;Friend(s);Available 24 hours/day Type of Home: House Home Access: Stairs to enter [employer] of Steps:  2 Entrance Stairs-Rails: None Home Layout: One level Bathroom Shower/Tub: Multimedia programmer: Handicapped height Home Adaptive Equipment: Environmental consultant - Audiological scientist Prior Function Level of Independence: Independent Able to Take Stairs?: Yes Driving: No Vocation: Retired Corporate investment banker: No difficulties Dominant Hand: Right    Cognition  Overall Cognitive Status: Appears within functional limits for tasks assessed/performed Arousal/Alertness: Awake/alert Orientation Level: Oriented X4 / Intact;Appears intact for tasks assessed Behavior During Session: Agitated Cognition - Other Comments: intact.    Extremity/Trunk Assessment Right Upper Extremity Assessment RUE ROM/Strength/Tone: Within functional levels RUE Sensation: WFL - Light Touch RUE Coordination: WFL - gross/fine motor Left Upper Extremity Assessment LUE ROM/Strength/Tone: Within functional levels LUE Sensation: WFL - Light Touch LUE Coordination: WFL - gross/fine motor Right Lower Extremity Assessment RLE ROM/Strength/Tone: Unable to fully assess;WFL for tasks assessed Left Lower Extremity Assessment LLE ROM/Strength/Tone: Unable to fully assess;WFL for tasks assessed   Balance Balance Balance Assessed: No  End of Session    GP     Duncan Dull 06/14/2012, 1:53 PM Alben Deeds, [place] DPT  [phone]

## 2012-06-14 NOTE — Progress Notes (Signed)
Wound dry. Ne hematoma. A lot of pain in left major bursa. Pain medication was changed. To get decadron. Pt/ot to see.no weakness. Foley to be dc.

## 2012-06-14 NOTE — Plan of Care (Signed)
Problem: Phase I Progression Outcomes Goal: OOB as tolerated unless otherwise ordered Outcome: Not Progressing Patient unable to tolerate activity; reports 10/10 pain when transferring sidelying to sit.  Alben Deeds, [place] DPT  [phone]

## 2012-06-14 NOTE — Progress Notes (Signed)
MDs. Please advise if pt is able to get up for therapy.  Nursing states pt is on bedrest.  Bedrest orders not noted.  Please advise on how to move forward. Jinger Neighbors, [place] [phone]

## 2012-06-14 NOTE — Progress Notes (Signed)
   CARE MANAGEMENT NOTE 06/14/2012  Patient:  Katelyn Rowland, Katelyn Rowland   Account Number:  [id number]  Date Initiated:  06/14/2012  Documentation initiated by:  [facility]  Subjective/Objective Assessment:   Bilateral thoracic 10-11 laminectomy, decompression of the  spinal cord, removal of exostosis attached to the dura mater     Action/Plan:   lives alone   Anticipated DC Date:     Anticipated DC Plan:  [place]  CM consult      Choice offered to / List presented to:             Status of service:  In process, will continue to follow Medicare Important Message given?   (If response is "NO", the following Medicare IM given date fields will be blank) Date Medicare IM given:   Date Additional Medicare IM given:    Discharge Disposition:    Per UR Regulation:    If discussed at Long Length of Stay Meetings, dates discussed:    Comments:  06/14/2012 0930 NCM spoke to pt and her goal is to d/c home with [facility]. Will wait PT/OT recommendations for post d/c. Pt lives at home alone and may need 24 hour assistance. Jonnie Finner RN CCM Case Mgmt phone [phone]

## 2012-06-14 NOTE — Progress Notes (Signed)
Pt continued to be in severe pain again despite efforts with various pain and muscle relaxant medication, positioning, distraction. Dr. Vertell Limber notified, orders received and carried out. Will cont to monitor patient.

## 2012-06-15 NOTE — Progress Notes (Signed)
Pt up OOB to chair after physical therapy session. Told patient I was going to remove her foley now that she has capability of getting OOB. Pt said she did not want it removed today, that she wants it removed tomorrow, so she can "have one more day to get my legs strong enough to walk to the bathroom."   Minor, [employer]

## 2012-06-15 NOTE — Progress Notes (Signed)
Subjective: Patient reports Status post T10-T11 laminectomy and decompression for myelopathy on 11 7 some back pain. Legs feel better  Objective: Vital signs in last 24 hours: Temp:  [97.2 F (36.2 C)-99.4 F (37.4 C)] 98.2 F (36.8 C) (11/09 1041) Pulse Rate:  [63-83] 63  (11/09 1041) Resp:  [13-20] 14  (11/09 1120) BP: (140-180)/(60-90) 147/78 mmHg (11/09 1041) SpO2:  [95 %-100 %] 100 % (11/09 1041)  Intake/Output from previous day: 11/08 0701 - 11/09 0700 In: -  Out: 2325 [Urine:2325] Intake/Output this shift:    Motor function at least 4/5 in iliopsoas quadricep tibialis anterior and gastroc. Some dysesthesias in lower extremities still reported.  Lab Results: No results found for this basename: WBC:2,HGB:2,HCT:2,PLT:2 in the last 72 hours BMET No results found for this basename: NA:2,K:2,CL:2,CO2:2,GLUCOSE:2,BUN:2,CREATININE:2,CALCIUM:2 in the last 72 hours  Studies/Results: No results found.  Assessment/Plan: Stable postop day 2  LOS: 3 days  Mobilize as tolerated.   Yarixa Lightcap J 06/15/2012, 11:42 AM

## 2012-06-15 NOTE — Progress Notes (Signed)
Physical Therapy Treatment Patient Details Name: Katelyn Rowland MRN: [id number] DOB: 02/22/1941 Today's Date: 06/15/2012 Time: [id number] PT Time Calculation (min): 28 min  PT Assessment / Plan / Recommendation Comments on Treatment Session  Pt making good progress towards goals today with increased activity and less assitance needed. Still rating her pain high and complianed of increased sciatic nerve pain with sitting on edge of bed that decreased with standing.    Follow Up Recommendations  SNF                 Frequency Min 3X/week   Plan Discharge plan remains appropriate;Frequency remains appropriate;Other (comment) (both discharge and freq may need updating if cont to progres)    Precautions / Restrictions Precautions Precautions: Back Precaution Comments: Reeducated pt on 3/3 back precautions due to her only being able to recall 1/3.       Mobility  Bed Mobility Rolling Left: 4: Min guard;With rail Right Sidelying to Sit: 4: Min assist;With rails;HOB flat Details for Bed Mobility Assistance: cues for technique (bend knees, roll onto side, reach for rail) and use of arms with sitting up. Pt prefers to use top leg on bed to counter act her pushing up with elbow is side lying to get her trunk up and then she advances the leg off the bed to fully sit up. Increased time needed for mobility. Transfers Transfers: Sit to Stand;Stand to Sit;Stand Pivot Transfers Sit to Stand: 4: Min assist;From elevated surface;With upper extremity assist;From bed Stand to Sit: 4: Min guard;To chair/3-in-1;With upper extremity assist;With armrests Stand Pivot Transfers: 4: Min assist Details for Transfer Assistance: cues for hand placement with sit - stand transfers. cues/facilitation for upright posture (trunk/hip/knee ext) once standing. min assist with cues for walker use/position and to keep advancing LE"s toward chair with stand pivot transfer.     PT Goals Acute Rehab PT Goals PT Goal:  Rolling Supine to Left Side - Progress: Progressing toward goal PT Goal: Supine/Side to Sit - Progress: Progressing toward goal PT Goal: Sit to Stand - Progress: Progressing toward goal PT Goal: Stand to Sit - Progress: Progressing toward goal  Visit Information  Last PT Received On: 06/15/12 Assistance Needed: +1    Subjective Data  Subjective: No new complaints, agreeable to out of bed today with therapy. 'I have been wanting to get up all day'! Reports back pain as 5/10.   Cognition  Arousal/Alertness: Awake/alert Orientation Level: Appears intact for tasks assessed Behavior During Session: [facility] for tasks performed       End of Session PT - End of Session Equipment Utilized During Treatment: Gait belt Activity Tolerance: Patient tolerated treatment well Patient left: in chair;with call bell/phone within reach Nurse Communication: Mobility status;Patient requests pain meds   GP     Willow Ora 06/15/2012, 3:39 PM  Willow Ora, PTA Office- [phone]

## 2012-06-16 NOTE — Progress Notes (Signed)
Agree with change in frequency and D/C plan 06/16/2012 Ambrose [place] DPT PAGER: [phone] OFFICE: [phone]

## 2012-06-16 NOTE — Clinical Social Work Note (Signed)
CSW consult for SNF. PT recommendation for [facility] with supervision noted. Per chart review, pt has adequate supervision post d/c to cover 24/7 supervision. CSW singing off as no other CSW needs identified at this time.  Wandra Feinstein, MSW, [place] (Weekends 8:00am-4:30pm)

## 2012-06-16 NOTE — Progress Notes (Signed)
Patient ID: Katelyn Rowland, female   DOB: 10-13-1940, 71 y.o.   MRN: [id number] Subjective: Patient reports feeling well  Objective: Vital signs in last 24 hours: Temp:  [97.7 F (36.5 C)-98.9 F (37.2 C)] 98 F (36.7 C) (11/10 0915) Pulse Rate:  [68-73] 69  (11/10 0915) Resp:  [12-20] 12  (11/10 1128) BP: (142-190)/(70-86) 142/82 mmHg (11/10 0915) SpO2:  [97 %-100 %] 97 % (11/10 0915)  Intake/Output from previous day: 11/09 0701 - 11/10 0700 In: 243 [P.O.:240; I.V.:3] Out: 1625 [Urine:1625] Intake/Output this shift:    Wound:clean and dry   Lab Results: No results found for this basename: WBC:2,HGB:2,HCT:2,PLT:2 in the last 72 hours BMET No results found for this basename: NA:2,K:2,CL:2,CO2:2,GLUCOSE:2,BUN:2,CREATININE:2,CALCIUM:2 in the last 72 hours  Studies/Results: No results found.  Assessment/Plan: Doing well. Will continue to increase activity.  LOS: 4 days  as above   Faythe Ghee, MD 06/16/2012, 11:32 AM

## 2012-06-16 NOTE — Progress Notes (Signed)
PCA Dilaudid discontinued as ordered. Wasted 0.6 mg of Dilaudid in sink with Lovena Neighbours., RN as witness.

## 2012-06-16 NOTE — Progress Notes (Signed)
Pt wanted to keep foley catheter in one more day, however I explained the risk of UTI from prolonged catheter use, and patient agreed to take it out. Foley cath removed at [place], Dirk Dress

## 2012-06-16 NOTE — Progress Notes (Signed)
Physical Therapy Treatment Patient Details Name: Katelyn Rowland MRN: [id number] DOB: 06-19-1941 Today's Date: 06/16/2012 Time: [id number] PT Time Calculation (min): 24 min  PT Assessment / Plan / Recommendation Comments on Treatment Session  Pt continues to make great progress with each session. Discussed progress with the PT coveing today. Will update her frequency and discharge status to reflect this progress. Pt reports she is able to have 24 hour assist at home through family/friends/neighbors and want to go home not to rehab. Discharge recommendations update to home with HHPT.             Follow Up Recommendations  Home health PT;Supervision for mobility/OOB           Equipment Recommendations  None recommended by OT;None recommended by PT       Frequency Min 5X/week   Plan Discharge plan needs to be updated;Frequency needs to be updated    Precautions / Restrictions Precautions Precautions: Back Precaution Comments: Pt able to recall 3/3 back precautions, however needs some cueing to adhere to them with mobililty       Mobility  Bed Mobility Rolling Right: 5: Supervision;With rail Right Sidelying to Sit: 5: Supervision;HOB flat;With rails Details for Bed Mobility Assistance: min cues for logroll technique to adhere to back precautions. Increased time needed for maximal pt participation Transfers Sit to Stand: 4: Min guard;From bed;With upper extremity assist Stand to Sit: 4: Min assist;To chair/3-in-1;With upper extremity assist;With armrests Details for Transfer Assistance: cues for hand placement with transfers for safety and min assit to control descent with sitting down into chair today. Ambulation/Gait Ambulation/Gait Assistance: 4: Min guard;5: Supervision Ambulation Distance (Feet): 300 Feet Assistive device: Rolling walker Ambulation/Gait Assistance Details: Min cues for posture and walker use/position. Progressed from min guard assist to supervison only. Gait  Pattern: Step-through pattern;Decreased stride length;Trunk flexed Gait velocity: Decreased slightly.      PT Goals Acute Rehab PT Goals PT Goal: Rolling Supine to Right Side - Progress: Progressing toward goal PT Goal: Supine/Side to Sit - Progress: Progressing toward goal PT Goal: Sit to Stand - Progress: Progressing toward goal PT Goal: Stand to Sit - Progress: Progressing toward goal PT Goal: Ambulate - Progress: Progressing toward goal  Visit Information  Last PT Received On: 06/16/12 Assistance Needed: +1    Subjective Data  Subjective: No new complaints, agreeable and ready to get out of bed. Reports her pain as "manageable".   Cognition  Overall Cognitive Status: Appears within functional limits for tasks assessed/performed Arousal/Alertness: Awake/alert Orientation Level: Appears intact for tasks assessed Behavior During Session: [facility] for tasks performed       End of Session PT - End of Session Equipment Utilized During Treatment: Gait belt Activity Tolerance: Patient tolerated treatment well;Treatment limited secondary to agitation Patient left: in chair;with call bell/phone within reach Nurse Communication: Mobility status   GP     Willow Ora 06/16/2012, 2:19 PM  Willow Ora, PTA Office- [phone]

## 2012-06-17 NOTE — Progress Notes (Signed)
CARE MANAGEMENT NOTE 06/17/2012  Patient:  COURTENAY, BUSKO   Account Number:  [id number]  Date Initiated:  06/14/2012  Documentation initiated by:  [facility]  Subjective/Objective Assessment:   Bilateral thoracic 10-11 laminectomy, decompression of the  spinal cord, removal of exostosis attached to the dura mater     Action/Plan:   lives alone  06/17/12 Choice offered to patient for [facility] needs. She has rolling walker and 3in1. Has support of friends at discharge.Meals already prepared and frozen.   Anticipated DC Date:  06/17/2012   Anticipated DC Plan:  [place]  CM consult      Choice offered to / List presented to:  C-1 Patient        [place] arranged  HH-2 PT  HH-3 OT      Status of service:  Completed, signed off Medicare Important Message given?   (If response is "NO", the following Medicare IM given date fields will be blank) Date Medicare IM given:   Date Additional Medicare IM given:    Discharge Disposition:  [place]  Per UR Regulation:    If discussed at Long Length of Stay Meetings, dates discussed:    Comments:  06/14/2012 0930 NCM spoke to pt and her goal is to d/c home with [facility]. Will wait PT/OT recommendations for post d/c. Pt lives at home alone and may need 24 hour assistance. Jonnie Finner RN CCM Case Mgmt phone [phone]

## 2012-06-17 NOTE — Progress Notes (Signed)
Patient d/c instructions and education given. All questions answered to patient's satisfaction. Pt D/C home with no signs of acute distress.

## 2012-06-17 NOTE — Progress Notes (Signed)
Physical Therapy Treatment Patient Details Name: Katelyn Rowland MRN: [id number] DOB: [birth year] Today's Date: 06/17/2012 Time: [id number] PT Time Calculation (min): 17 min  PT Assessment / Plan / Recommendation Comments on Treatment Session  Pt moving well.  Very eager to d/c home & is mobilizing at a level to safely do so.  Pt cont's to report she will have 24 hr (A) at home through family & friends.      Follow Up Recommendations  Home health PT;Supervision for mobility/OOB     Does the patient have the potential to tolerate intense rehabilitation     Barriers to Discharge        Equipment Recommendations  None recommended by OT    Recommendations for Other Services    Frequency Min 5X/week   Plan      Precautions / Restrictions Precautions Precautions: Back Precaution Comments: Min cues to reinforce "no twisting" in standing Restrictions Weight Bearing Restrictions: No       Mobility  Bed Mobility Bed Mobility: Right Sidelying to Sit;Sit to Sidelying Right Rolling Right: 6: Modified independent (Device/Increase time);With rail Rolling Left: 6: Modified independent (Device/Increase time) Right Sidelying to Sit: 6: Modified independent (Device/Increase time);HOB flat Left Sidelying to Sit: 6: Modified independent (Device/Increase time);With rails;HOB flat Sitting - Scoot to Edge of Bed: 7: Independent Sit to Sidelying Right: 6: Modified independent (Device/Increase time);HOB flat Sit to Sidelying Left: 6: Modified independent (Device/Increase time);With rail Details for Bed Mobility Assistance: Pt demonstrated proper technique.   Transfers Transfers: Sit to Stand;Stand to Sit Sit to Stand: 6: Modified independent (Device/Increase time);With upper extremity assist;From bed Stand to Sit: 6: Modified independent (Device/Increase time);With upper extremity assist;To bed Details for Transfer Assistance: Leon for safest hand placement  initially Ambulation/Gait Ambulation/Gait Assistance: 5: Supervision Ambulation Distance (Feet): 300 Feet Assistive device: Rolling walker Ambulation/Gait Assistance Details: Min cueing to reinforce "no twisting".   Gait Pattern: Step-through pattern Gait velocity: Decreased slightly. Stairs: Yes Stairs Assistance: 5: Supervision Stairs Assistance Details (indicate cue type and reason): Pt used bil rails to mimic door frame that she is able to hold onto for support at home.  Educated pt on up with strong, down with weak.   Stair Management Technique: Two rails;Step to pattern;Forwards Number of Stairs: 3      PT Goals Acute Rehab PT Goals Time For Goal Achievement: 06/19/12 Potential to Achieve Goals: Fair Pt will Roll Supine to Right Side: with modified independence Pt will Roll Supine to Left Side: with modified independence Pt will go Supine/Side to Sit: with modified independence PT Goal: Supine/Side to Sit - Progress: Progressing toward goal Pt will go Sit to Stand: with modified independence PT Goal: Sit to Stand - Progress: Progressing toward goal Pt will go Stand to Sit: with modified independence PT Goal: Stand to Sit - Progress: Progressing toward goal Pt will Ambulate: >150 feet;with modified independence;with rolling walker PT Goal: Ambulate - Progress: Progressing toward goal Pt will Go Up / Down Stairs: 3-5 stairs;with modified independence;with least restrictive assistive device PT Goal: Up/Down Stairs - Progress: Progressing toward goal  Visit Information  Last PT Received On: 06/17/12 Assistance Needed: +1    Subjective Data      Cognition  Overall Cognitive Status: Appears within functional limits for tasks assessed/performed Arousal/Alertness: Awake/alert Orientation Level: Appears intact for tasks assessed Behavior During Session: [facility] for tasks performed    Balance  Balance Balance Assessed: No  End of Session PT - End of Session Equipment Utilized  During Treatment: Gait belt Activity Tolerance: Patient tolerated treatment well Patient left: in bed;with call bell/phone within reach;with nursing in room Nurse Communication: Mobility status     Sarajane Marek, [place] [phone] 06/17/2012

## 2012-06-17 NOTE — Discharge Summary (Signed)
Physician Discharge Summary  Patient ID: Katelyn Rowland MRN: [id number] DOB/AGE: 02-08-1941 71 y.o.  Admit date: 06/12/2012 Discharge date: 06/17/2012  Admission Diagnoses:thoracic stenosis Discharge Diagnoses: same Active Problems:  * No active hospital problems. *    Discharged Condition:no pain or weakness  Hospital Course: surgery  Consults: none Significant Diagnostic Studies: myelogram  Treatments: thoracic decompression  Discharge Exam: Blood pressure 170/54, pulse 58, temperature 98 F (36.7 C), temperature source Oral, resp. rate 18, height 5\' 3"  (1.6 m), weight 88.6 kg (195 lb 5.2 oz), SpO2 98.00%. Ambulating, no weakness  Disposition:      Medication List     As of 06/17/2012  9:09 AM    ASK your doctor about these medications         alendronate 70 MG tablet   Commonly known as: FOSAMAX   Take 1 tablet (70 mg total) by mouth every 7 (seven) days. Take with a full glass of water on an empty stomach.      calcium-vitamin D 500-200 MG-UNIT per tablet   Commonly known as: OSCAL WITH D   Take 1 tablet by mouth 3 (three) times daily.      gabapentin 100 MG capsule   Commonly known as: NEURONTIN   TAKE (1) CAPSULE BY MOUTH AT BEDTIME.      levothyroxine 200 MCG tablet   Commonly known as: SYNTHROID, LEVOTHROID   Take 1 tablet (200 mcg total) by mouth daily.      LOTENSIN 40 MG tablet   Generic drug: benazepril   TAKE (1) TABLET BY MOUTH DAILY.      methadone 10 MG tablet   Commonly known as: DOLOPHINE   Take 1 tablet (10 mg total) by mouth every 6 (six) hours.      MEVACOR 20 MG tablet   Generic drug: lovastatin   TAKE ONE TABLET BY MOUTH AT BEDTIME ON MONDAY, WEDNESDAY, AND FRIDAY.      NEXIUM 40 MG capsule   Generic drug: esomeprazole   TAKE ONE CAPSULE BY MOUTH DAILY      nystatin 100000 UNIT/GM Powd   APPLY TO AFFECTED AREAS TWICE DAILY.      polyethylene glycol powder powder   Commonly known as: GLYCOLAX/MIRALAX   Take one capful with  8oz of water once daily Take 17 g by mouth. Take one capful with 8oz of water once daily      promethazine 25 MG tablet   Commonly known as: PHENERGAN   Take 25 mg by mouth every 6 (six) hours as needed. For nausea      traZODone 100 MG tablet   Commonly known as: DESYREL   Take 100 mg by mouth at bedtime.         Signed: Floyce Stakes 06/17/2012, 9:09 AM

## 2012-06-17 NOTE — Progress Notes (Signed)
Occupational Therapy Treatment Patient Details Name: Katelyn Rowland MRN: [id number] DOB: 1940/09/29 Today's Date: 06/17/2012 Time: [id number] OT Time Calculation (min): 41 min  OT Assessment / Plan / Recommendation Comments on Treatment Session Pt's pain is well managed and she is moving well.  Able to move about the room to gather ADL items and to use bathroom mod I with RW.  Pt to return home.  Discussed at length back precautions related to IADL.  Pt will have intermittent assist at home.    Follow Up Recommendations  Home health OT;Supervision - Intermittent    Barriers to Discharge       Equipment Recommendations  None recommended by OT    Recommendations for Other Services    Frequency Min 2X/week   Plan Discharge plan needs to be updated    Precautions / Restrictions Precautions Precautions: Back Precaution Comments: Cues needed to avoid twisting in standing and sitting. Restrictions Weight Bearing Restrictions: No   Pertinent Vitals/Pain No c/o pain, premedicated.    ADL  Grooming: Performed;Modified independent Where Assessed - Grooming: Unsupported sitting Upper Body Dressing: Performed;Modified independent Where Assessed - Upper Body Dressing: Unsupported standing Lower Body Dressing: Performed;Modified independent Where Assessed - Lower Body Dressing: Unsupported sitting;Unsupported sit to stand Toilet Transfer: Modified independent;Performed Toilet Transfer Method: Sit to Loss adjuster, chartered: Comfort height toilet Toileting - Clothing Manipulation and Hygiene: Performed;Modified independent Where Assessed - Toileting Clothing Manipulation and Hygiene: Sit to stand from 3-in-1 or toilet Equipment Used: Rolling walker Transfers/Ambulation Related to ADLs: mod I with RW ADL Comments: Pt has a sock aid and reacher at home and is independent in use.    OT Diagnosis:    OT Problem List:   OT Treatment Interventions:     OT Goals Acute Rehab OT  Goals OT Goal Formulation: With patient Time For Goal Achievement: 06/28/12 Potential to Achieve Goals: Good ADL Goals Pt Will Perform Grooming: with supervision;Standing at sink ADL Goal: Grooming - Progress: Met Pt Will Perform Lower Body Bathing: with min assist;Sit to stand from chair;Standing at sink;Sitting at sink ADL Goal: Lower Body Bathing - Progress: Met Pt Will Perform Lower Body Dressing: with min assist;Sit to stand from chair;with adaptive equipment ADL Goal: Lower Body Dressing - Progress: Met Pt Will Perform Tub/Shower Transfer: Shower transfer;with min assist;Shower seat with back Additional ADL Goal #1: Pt will complete all aspects of toileting with min assist on comfort height commode. ADL Goal: Additional Goal #1 - Progress: Met  Visit Information  Last OT Received On: 06/17/12 Assistance Needed: +1    Subjective Data      Prior Functioning       Cognition  Overall Cognitive Status: Appears within functional limits for tasks assessed/performed Arousal/Alertness: Awake/alert Orientation Level: Appears intact for tasks assessed Behavior During Session: [facility] for tasks performed    Mobility  Shoulder Instructions Bed Mobility Bed Mobility: Rolling Left;Left Sidelying to Sit;Sitting - Scoot to Edge of Bed;Sit to Sidelying Left;Rolling Right Rolling Right: 6: Modified independent (Device/Increase time);With rail Rolling Left: 6: Modified independent (Device/Increase time) Left Sidelying to Sit: 6: Modified independent (Device/Increase time);With rails;HOB flat Sitting - Scoot to Edge of Bed: 7: Independent Sit to Sidelying Left: 6: Modified independent (Device/Increase time);With rail Transfers Transfers: Sit to Stand;Stand to Sit Sit to Stand: 6: Modified independent (Device/Increase time);With upper extremity assist;From bed;From toilet Stand to Sit: 6: Modified independent (Device/Increase time);To bed;To chair/3-in-1;With upper extremity assist         Exercises  Balance     End of Session OT - End of Session Activity Tolerance: Patient tolerated treatment well Patient left: in bed;with call bell/phone within reach;with bed alarm set Nurse Communication: Other (comment) (pt ready for d/c, has called her ride)  GO     Malka So 06/17/2012, 10:41 AM [phone]

## 2012-06-19 DIAGNOSIS — E119 Type 2 diabetes mellitus without complications: Secondary | ICD-10-CM | POA: Diagnosis not present

## 2012-06-19 DIAGNOSIS — Z4789 Encounter for other orthopedic aftercare: Secondary | ICD-10-CM | POA: Diagnosis not present

## 2012-06-19 DIAGNOSIS — I10 Essential (primary) hypertension: Secondary | ICD-10-CM | POA: Diagnosis not present

## 2012-06-19 DIAGNOSIS — IMO0001 Reserved for inherently not codable concepts without codable children: Secondary | ICD-10-CM | POA: Diagnosis not present

## 2012-06-19 DIAGNOSIS — M159 Polyosteoarthritis, unspecified: Secondary | ICD-10-CM | POA: Diagnosis not present

## 2012-06-21 ENCOUNTER — Other Ambulatory Visit: Payer: Self-pay

## 2012-06-21 DIAGNOSIS — M549 Dorsalgia, unspecified: Secondary | ICD-10-CM

## 2012-06-21 DIAGNOSIS — M159 Polyosteoarthritis, unspecified: Secondary | ICD-10-CM | POA: Diagnosis not present

## 2012-06-21 DIAGNOSIS — Z4789 Encounter for other orthopedic aftercare: Secondary | ICD-10-CM | POA: Diagnosis not present

## 2012-06-21 DIAGNOSIS — E119 Type 2 diabetes mellitus without complications: Secondary | ICD-10-CM | POA: Diagnosis not present

## 2012-06-21 DIAGNOSIS — I10 Essential (primary) hypertension: Secondary | ICD-10-CM | POA: Diagnosis not present

## 2012-06-21 DIAGNOSIS — IMO0001 Reserved for inherently not codable concepts without codable children: Secondary | ICD-10-CM | POA: Diagnosis not present

## 2012-06-21 MED ORDER — METHADONE HCL 10 MG PO TABS: 10.0000 mg | ORAL_TABLET | Freq: Four times a day (QID) | ORAL | Status: DC

## 2012-06-26 DIAGNOSIS — E119 Type 2 diabetes mellitus without complications: Secondary | ICD-10-CM | POA: Diagnosis not present

## 2012-06-26 DIAGNOSIS — I10 Essential (primary) hypertension: Secondary | ICD-10-CM | POA: Diagnosis not present

## 2012-06-26 DIAGNOSIS — Z4789 Encounter for other orthopedic aftercare: Secondary | ICD-10-CM | POA: Diagnosis not present

## 2012-06-26 DIAGNOSIS — M159 Polyosteoarthritis, unspecified: Secondary | ICD-10-CM | POA: Diagnosis not present

## 2012-06-26 DIAGNOSIS — IMO0001 Reserved for inherently not codable concepts without codable children: Secondary | ICD-10-CM | POA: Diagnosis not present

## 2012-07-01 ENCOUNTER — Telehealth: Payer: Self-pay | Admitting: Family Medicine

## 2012-07-01 DIAGNOSIS — G4700 Insomnia, unspecified: Secondary | ICD-10-CM

## 2012-07-01 MED ORDER — TRAZODONE HCL 100 MG PO TABS: 100.0000 mg | ORAL_TABLET | Freq: Every day | ORAL | Status: DC

## 2012-07-01 NOTE — Telephone Encounter (Signed)
Sent!

## 2012-07-02 DIAGNOSIS — M159 Polyosteoarthritis, unspecified: Secondary | ICD-10-CM | POA: Diagnosis not present

## 2012-07-02 DIAGNOSIS — I10 Essential (primary) hypertension: Secondary | ICD-10-CM | POA: Diagnosis not present

## 2012-07-02 DIAGNOSIS — E119 Type 2 diabetes mellitus without complications: Secondary | ICD-10-CM | POA: Diagnosis not present

## 2012-07-02 DIAGNOSIS — Z4789 Encounter for other orthopedic aftercare: Secondary | ICD-10-CM | POA: Diagnosis not present

## 2012-07-02 DIAGNOSIS — IMO0001 Reserved for inherently not codable concepts without codable children: Secondary | ICD-10-CM | POA: Diagnosis not present

## 2012-07-05 DIAGNOSIS — M159 Polyosteoarthritis, unspecified: Secondary | ICD-10-CM | POA: Diagnosis not present

## 2012-07-05 DIAGNOSIS — E119 Type 2 diabetes mellitus without complications: Secondary | ICD-10-CM | POA: Diagnosis not present

## 2012-07-05 DIAGNOSIS — IMO0001 Reserved for inherently not codable concepts without codable children: Secondary | ICD-10-CM | POA: Diagnosis not present

## 2012-07-05 DIAGNOSIS — Z4789 Encounter for other orthopedic aftercare: Secondary | ICD-10-CM | POA: Diagnosis not present

## 2012-07-05 DIAGNOSIS — I10 Essential (primary) hypertension: Secondary | ICD-10-CM | POA: Diagnosis not present

## 2012-07-17 ENCOUNTER — Other Ambulatory Visit: Payer: Self-pay | Admitting: Family Medicine

## 2012-07-17 ENCOUNTER — Ambulatory Visit (INDEPENDENT_AMBULATORY_CARE_PROVIDER_SITE_OTHER): Payer: Medicare Other | Admitting: Family Medicine

## 2012-07-17 ENCOUNTER — Encounter: Payer: Self-pay | Admitting: Family Medicine

## 2012-07-17 ENCOUNTER — Ambulatory Visit (HOSPITAL_COMMUNITY)
Admission: RE | Admit: 2012-07-17 | Discharge: 2012-07-17 | Disposition: A | Discharge status: Home / Self Care | Payer: Medicare Other | Source: Ambulatory Visit | Attending: Family Medicine | Admitting: Family Medicine

## 2012-07-17 VITALS — BP 144/80 | HR 66 | Resp 18 | Ht 63.0 in | Wt 211.1 lb

## 2012-07-17 DIAGNOSIS — I82409 Acute embolism and thrombosis of unspecified deep veins of unspecified lower extremity: Secondary | ICD-10-CM

## 2012-07-17 DIAGNOSIS — R5381 Other malaise: Secondary | ICD-10-CM

## 2012-07-17 DIAGNOSIS — I825Y9 Chronic embolism and thrombosis of unspecified deep veins of unspecified proximal lower extremity: Secondary | ICD-10-CM | POA: Insufficient documentation

## 2012-07-17 DIAGNOSIS — E039 Hypothyroidism, unspecified: Secondary | ICD-10-CM | POA: Diagnosis not present

## 2012-07-17 DIAGNOSIS — R7301 Impaired fasting glucose: Secondary | ICD-10-CM | POA: Diagnosis not present

## 2012-07-17 DIAGNOSIS — M79609 Pain in unspecified limb: Secondary | ICD-10-CM

## 2012-07-17 DIAGNOSIS — I10 Essential (primary) hypertension: Secondary | ICD-10-CM

## 2012-07-17 DIAGNOSIS — R5383 Other fatigue: Secondary | ICD-10-CM

## 2012-07-17 DIAGNOSIS — M79662 Pain in left lower leg: Secondary | ICD-10-CM

## 2012-07-17 DIAGNOSIS — E669 Obesity, unspecified: Secondary | ICD-10-CM

## 2012-07-17 DIAGNOSIS — E785 Hyperlipidemia, unspecified: Secondary | ICD-10-CM

## 2012-07-17 LAB — CBC
MCH: 31.3 pg (ref 26.0–34.0)
MCHC: 33.6 g/dL (ref 30.0–36.0)
Platelets: 257 10*3/uL (ref 150–400)
RDW: 13.7 % (ref 11.5–15.5)

## 2012-07-17 NOTE — Progress Notes (Signed)
  Subjective:    Patient ID: Katelyn Rowland, female    DOB: 03-09-1941, 71 y.o.   MRN: [id number]  HPI 1 week h/o increased swelling, warmth and redness of left calf. Pt has chronic left DVT and has had to be treated in the past for acute DVT. C/o fatigue, denies cough or hemoptysis, fever or chills. Pt is recovering from c spine surgery performed approx 1 monh ago, Novemebr 6, 2014   Review of Systems See HPI Denies recent fever or chills. Denies sinus pressure, nasal congestion, ear pain or sore throat. Denies chest congestion, productive cough or wheezing. Denies chest pains, palpitations and leg swelling Denies abdominal pain, nausea, vomiting,diarrhea or constipation.   Denies dysuria, frequency, hesitancy or incontinence. Chronic  joint pain,  and limitation in mobility. Denies headaches, seizures, numbness, or tingling. Denies depression, anxiety or insomnia. Denies skin break down or rash.        Objective:   Physical Exam Patient alert and oriented and in no cardiopulmonary distress.  HEENT: No facial asymmetry, EOMI, no sinus tenderness,  oropharynx pink and moist.  Neck decreased though adequate ROM no adenopathy.  Chest: Clear to auscultation bilaterally.  CVS: S1, S2 no murmurs, no S3.  ABD: Soft non tender. Bowel sounds normal.  [id number] swelling and warmth of left calf, suspicious for acute DVT  [id number] ROM spine, shoulders, hips and knees.  Skin: Intact, no ulcerations or rash noted.  Psych: Good eye contact, normal affect. Memory intact mildly anxious not depressed appearing.  CNS: CN 2-12 intact, power, tone and sensation normal throughout.        Assessment & Plan:

## 2012-07-17 NOTE — Patient Instructions (Addendum)
You need an US of the left leg , I believe that you have  A recurrent clot. I have spoken with the ED Doc and will also speak with the Hospitalist, please stay in the xray dept until I speak with you.

## 2012-07-18 ENCOUNTER — Telehealth: Payer: Self-pay | Admitting: Family Medicine

## 2012-07-18 LAB — BASIC METABOLIC PANEL
Calcium: 9.3 mg/dL (ref 8.4–10.5)
Creat: 1.07 mg/dL (ref 0.50–1.10)
Sodium: 142 mEq/L (ref 135–145)

## 2012-07-19 ENCOUNTER — Other Ambulatory Visit: Payer: Self-pay | Admitting: Family Medicine

## 2012-07-19 ENCOUNTER — Other Ambulatory Visit: Payer: Self-pay

## 2012-07-19 ENCOUNTER — Telehealth: Payer: Self-pay | Admitting: Family Medicine

## 2012-07-19 DIAGNOSIS — I82509 Chronic embolism and thrombosis of unspecified deep veins of unspecified lower extremity: Secondary | ICD-10-CM

## 2012-07-19 LAB — HEMOGLOBIN A1C: Hgb A1c MFr Bld: 6.1 % — ABNORMAL HIGH (ref <5.7)

## 2012-07-19 MED ORDER — LEVOTHYROXINE SODIUM 50 MCG PO TABS: ORAL_TABLET | ORAL | Status: DC

## 2012-07-19 NOTE — Telephone Encounter (Signed)
Pt states she has noted  The swelling is no better, had mild weeping but is dried up, does not want to go to ED for re eval. Understands the need to if worsens. Or if she develops shortness of breath, she will await referral call today and referral staff is aware

## 2012-07-19 NOTE — Telephone Encounter (Signed)
Let her know thyroid is under corrected, NEEDS to take the synthroid 230mcg daily as prescribed, if she does not will need to go back to endo as in the past.I will enter an additional 85mcg daily for her to take a TOTAL of 214mcg daily, pls ensure she understands. Needs rept TSH in 6 weeks also   Since no acute clot but 'Chronic dVT" with swollen leg I recommend vascular eval and will refer to CVTS, pls let her know

## 2012-07-21 NOTE — Assessment & Plan Note (Signed)
Deteriorated. Patient re-educated about  the importance of commitment to a  minimum of 150 minutes of exercise per week. The importance of healthy food choices with portion control discussed. Encouraged to start a food diary, count calories and to consider  joining a support group. Sample diet sheets offered. Goals set by the patient for the next several months.    

## 2012-07-21 NOTE — Assessment & Plan Note (Signed)
Sub optimal control, however no med change at this time

## 2012-07-21 NOTE — Assessment & Plan Note (Signed)
Under corrected, dose increase in med, pt reports compliance

## 2012-07-21 NOTE — Assessment & Plan Note (Signed)
Increased swelling and redness, past h/o dVt, will r/o acute DVT stat

## 2012-07-21 NOTE — Assessment & Plan Note (Signed)
Hyperlipidemia:Low fat diet discussed and encouraged.  Controlled when last checked, update labs for next visit No med change

## 2012-07-22 NOTE — Addendum Note (Signed)
Addended by: Denman George B on: 07/22/2012 03:56 PM   Modules accepted: Orders

## 2012-07-24 ENCOUNTER — Other Ambulatory Visit: Payer: Self-pay | Admitting: *Deleted

## 2012-07-24 DIAGNOSIS — I8010 Phlebitis and thrombophlebitis of unspecified femoral vein: Secondary | ICD-10-CM

## 2012-07-24 DIAGNOSIS — M7989 Other specified soft tissue disorders: Secondary | ICD-10-CM

## 2012-07-24 DIAGNOSIS — Z86718 Personal history of other venous thrombosis and embolism: Secondary | ICD-10-CM

## 2012-07-24 NOTE — Telephone Encounter (Signed)
Pt aware.

## 2012-07-26 ENCOUNTER — Telehealth: Payer: Self-pay | Admitting: Family Medicine

## 2012-07-26 ENCOUNTER — Emergency Department (HOSPITAL_COMMUNITY)
Admission: EM | Admit: 2012-07-26 | Discharge: 2012-07-26 | Disposition: A | Discharge status: Home / Self Care | Payer: Medicare Other | Attending: Emergency Medicine | Admitting: Emergency Medicine

## 2012-07-26 ENCOUNTER — Emergency Department (HOSPITAL_COMMUNITY): Payer: Medicare Other

## 2012-07-26 ENCOUNTER — Encounter (HOSPITAL_COMMUNITY): Payer: Self-pay | Admitting: *Deleted

## 2012-07-26 DIAGNOSIS — Z87891 Personal history of nicotine dependence: Secondary | ICD-10-CM | POA: Diagnosis not present

## 2012-07-26 DIAGNOSIS — F411 Generalized anxiety disorder: Secondary | ICD-10-CM | POA: Diagnosis not present

## 2012-07-26 DIAGNOSIS — K219 Gastro-esophageal reflux disease without esophagitis: Secondary | ICD-10-CM | POA: Diagnosis not present

## 2012-07-26 DIAGNOSIS — R609 Edema, unspecified: Secondary | ICD-10-CM | POA: Diagnosis not present

## 2012-07-26 DIAGNOSIS — Z86718 Personal history of other venous thrombosis and embolism: Secondary | ICD-10-CM | POA: Insufficient documentation

## 2012-07-26 DIAGNOSIS — E079 Disorder of thyroid, unspecified: Secondary | ICD-10-CM | POA: Insufficient documentation

## 2012-07-26 DIAGNOSIS — Z87828 Personal history of other (healed) physical injury and trauma: Secondary | ICD-10-CM | POA: Diagnosis not present

## 2012-07-26 DIAGNOSIS — Z8739 Personal history of other diseases of the musculoskeletal system and connective tissue: Secondary | ICD-10-CM | POA: Insufficient documentation

## 2012-07-26 DIAGNOSIS — E119 Type 2 diabetes mellitus without complications: Secondary | ICD-10-CM | POA: Insufficient documentation

## 2012-07-26 DIAGNOSIS — I10 Essential (primary) hypertension: Secondary | ICD-10-CM | POA: Insufficient documentation

## 2012-07-26 DIAGNOSIS — F329 Major depressive disorder, single episode, unspecified: Secondary | ICD-10-CM | POA: Diagnosis not present

## 2012-07-26 DIAGNOSIS — F3289 Other specified depressive episodes: Secondary | ICD-10-CM | POA: Diagnosis not present

## 2012-07-26 DIAGNOSIS — Z79899 Other long term (current) drug therapy: Secondary | ICD-10-CM | POA: Diagnosis not present

## 2012-07-26 DIAGNOSIS — R600 Localized edema: Secondary | ICD-10-CM

## 2012-07-26 DIAGNOSIS — R229 Localized swelling, mass and lump, unspecified: Secondary | ICD-10-CM | POA: Diagnosis not present

## 2012-07-26 LAB — CBC WITH DIFFERENTIAL/PLATELET
Basophils Relative: 1 % (ref 0–1)
Eosinophils Absolute: 0.3 10*3/uL (ref 0.0–0.7)
Hemoglobin: 11.9 g/dL — ABNORMAL LOW (ref 12.0–15.0)
MCH: 31.6 pg (ref 26.0–34.0)
MCHC: 33.4 g/dL (ref 30.0–36.0)
Monocytes Relative: 11 % (ref 3–12)
Neutro Abs: 3.7 10*3/uL (ref 1.7–7.7)
Neutrophils Relative %: 54 % (ref 43–77)
Platelets: 241 10*3/uL (ref 150–400)
RBC: 3.76 MIL/uL — ABNORMAL LOW (ref 3.87–5.11)

## 2012-07-26 LAB — BASIC METABOLIC PANEL
Chloride: 99 mEq/L (ref 96–112)
GFR calc Af Amer: 61 mL/min — ABNORMAL LOW (ref >90)
GFR calc non Af Amer: 53 mL/min — ABNORMAL LOW (ref >90)
Potassium: 4.9 mEq/L (ref 3.5–5.1)
Sodium: 137 mEq/L (ref 135–145)

## 2012-07-26 MED ORDER — SULFAMETHOXAZOLE-TRIMETHOPRIM 800-160 MG PO TABS: 1.0000 | ORAL_TABLET | Freq: Two times a day (BID) | ORAL | Status: DC

## 2012-07-26 MED ORDER — CEPHALEXIN 500 MG PO CAPS: 500.0000 mg | ORAL_CAPSULE | Freq: Four times a day (QID) | ORAL | Status: DC

## 2012-07-26 MED ORDER — DIAZEPAM 5 MG PO TABS
5.0000 mg | ORAL_TABLET | Freq: Once | ORAL | Status: AC
  Administered 2012-07-26: 5 mg via ORAL
  Filled 2012-07-26: qty 1

## 2012-07-26 MED ORDER — VANCOMYCIN HCL IN DEXTROSE 1-5 GM/200ML-% IV SOLN
1000.0000 mg | Freq: Once | INTRAVENOUS | Status: AC
  Administered 2012-07-26: 1000 mg via INTRAVENOUS
  Filled 2012-07-26: qty 200

## 2012-07-26 MED ORDER — SODIUM CHLORIDE 0.9 % IV SOLN
Freq: Once | INTRAVENOUS | Status: AC
  Administered 2012-07-26: 20 mL/h via INTRAVENOUS

## 2012-07-26 NOTE — ED Notes (Signed)
Pt ambulated to bathroom with steady gait. Pt was slow to ambulate and states is painful but insists on using commode as opposed to the bedpan. Pt tolerated well.

## 2012-07-26 NOTE — ED Provider Notes (Signed)
History  This chart was scribed for Sharyon Cable, MD by Mikel Cella, ED Scribe. This patient was seen in room APA09/APA09 and the patient's care was started at 1427.  CSN: [id number]  Arrival date & time 07/26/12  1359   First MD Initiated Contact with Patient 07/26/12 1427      Chief Complaint  Patient presents with  . Leg Swelling     Patient is a 71 y.o. female presenting with leg pain. The history is provided by the patient.  Leg Pain  The incident occurred more than 1 week ago. There was no injury mechanism. The pain location is generalized. The pain is mild. The pain has been constant since onset. Pertinent negatives include no muscle weakness. She reports no foreign bodies present. The symptoms are aggravated by bearing weight.    Katelyn Rowland is a 71 y.o. female who presents to the Emergency Department complaining of gradually worsening lower leg swelling with associated redness. She states her bilateral legs began swelling on the 26th of  November. She states the pain and redness started shortly after that. She saw a vein specialist after that who stated she had phlebitis. She denies nausea, emesis, diarrhea, CP, fever and chills as associated symptoms. She has a h/o blood clots. She reports taking methadone and diazepam with no relief.    Past Medical History  Diagnosis Date  . Arthritis   . Edema of both legs   . Neck pain   . Thyroid disorder   . Reflux   . Depression   . Anxiety   . 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 Hematoma of leg     left   . PONV (postoperative nausea and vomiting)   . Headache   . Hypertension     margaret simpson  [id number]  . High blood pressure   . GERD (gastroesophageal reflux disease)   . Muscle cramps   . Diabetes mellitus 2009    no meds    Past Surgical History  Procedure Date  . Tonsillectomy   . Tonsillectomy and adenoidectomy   . Cervical spine surgery   .  Vesicovaginal fistula closure w/ tah   . Unilateal oopherectomy   . Appendectomy   . Foot neuroma surgery     right   . Nasal sinus surgery   . Ivc filter placement, following recurrent left dvt 07/2009  . Thoracic discectomy 06/12/2012    Procedure: THORACIC DISCECTOMY;  Surgeon: Floyce Stakes, MD;  Location: [facility];  Service: Neurosurgery;  Laterality: N/A;  Thoracic [employer] Thoracic Laminectomy    Family History  Problem Relation Age of Onset  . Emphysema Sister   . Diabetes Mother   . Diabetes Father   . Arthritis    . Kidney disease      History  Substance Use Topics  . Smoking status: Former Smoker    Types: Cigarettes    Quit date: 06/04/2010  . Smokeless tobacco: Not on file  . Alcohol Use: No   No OB history available.   Review of Systems  Constitutional: Negative for fever and chills.  Respiratory: Negative for shortness of breath.   Cardiovascular: Positive for leg swelling. Negative for chest pain.  Gastrointestinal: Negative for nausea, vomiting and diarrhea.  All other systems reviewed and are negative.    Allergies  Prednisone; Acetaminophen; and Warfarin sodium  Home Medications   Current Outpatient Rx  Name  Route  Sig  Dispense  Refill  . ALENDRONATE SODIUM 70 MG PO TABS   Oral   Take 70 mg by mouth every 7 (seven) days. Take with a full glass of water on an empty stomach. Mondays         . BENAZEPRIL HCL 40 MG PO TABS   Oral   Take 40 mg by mouth daily.         Marland Kitchen CALCIUM CARBONATE-VITAMIN D 500-200 MG-UNIT PO TABS   Oral   Take 1 tablet by mouth 3 (three) times daily.   90 tablet   11   . DIAZEPAM 5 MG PO TABS   Oral   Take 5 mg by mouth 3 (three) times daily as needed. Anxiety         . ESOMEPRAZOLE MAGNESIUM 40 MG PO CPDR   Oral   Take 40 mg by mouth daily before breakfast.         . GABAPENTIN 100 MG PO CAPS   Oral   Take 100 mg by mouth at bedtime.         Marland Kitchen LEVOTHYROXINE SODIUM 200 MCG PO TABS    Oral   Take 1 tablet (200 mcg total) by mouth daily.   30 tablet   11   . LOVASTATIN 20 MG PO TABS   Oral   Take 20 mg by mouth every Monday, Wednesday, and Friday.         . METHADONE HCL 10 MG PO TABS   Oral   Take 10 mg by mouth every 6 (six) hours. Pain         . NYSTATIN 100000 UNIT/GM EX CREA   Topical   Apply 1 application topically 2 (two) times daily as needed. Yeast         . POLYETHYLENE GLYCOL 3350 PO POWD   Oral   Take 17 g by mouth daily.         Marland Kitchen PROMETHAZINE HCL 25 MG PO TABS   Oral   Take 25 mg by mouth every 6 (six) hours as needed. For nausea         . TRAZODONE HCL 100 MG PO TABS   Oral   Take 1 tablet (100 mg total) by mouth at bedtime.   30 tablet   3     Triage Vitals: BP 155/66  Pulse 75  Temp 97.6 F (36.4 C) (Oral)  Resp 16  Ht 5\' 3"  (1.6 m)  Wt 210 lb (95.255 kg)  BMI 37.20 kg/m2  SpO2 100%  Physical Exam  CONSTITUTIONAL: Well developed/well nourished HEAD AND FACE: Normocephalic/atraumatic EYES: EOMI/PERRL ENMT: Mucous membranes moist NECK: supple no meningeal signs SPINE:entire spine nontender CV: S1/S2 noted LUNGS: Lungs are clear to auscultation bilaterally, no apparent distress ABDOMEN: soft, nontender, no rebound or guarding NEURO: Pt is awake/alert, moves all extremitiesx4 EXTREMITIES: pulses normal, full ROM, symmetric pitting edema to bilateral legs, erythema extending to distal thigh SKIN: warm, color normal PSYCH: no abnormalities of mood noted   ED Course  Procedures  DIAGNOSTIC STUDIES: Oxygen Saturation is 100% on room air, normal by my interpretation.    COORDINATION OF CARE:  3:00 PM: Discussed treatment plan which includes antibiotics and an ultrasound with pt at bedside and pt agreed to plan.  Pt with h/o DVT previously.  Recent left LE DVT study that was negative.  Now with right LE swelling/redness.  Will obtain US imaging of right LE.  If this is negative she may  still need admission for IV  antibiotics as she does have significant erythema to both limbs.      MDM  Nursing notes including past medical history and social history reviewed and considered in documentation Previous records reviewed and considered - h/o DVT       I personally performed the services described in this documentation, which was scribed in my presence. The recorded information has been reviewed and is accurate.      Sharyon Cable, MD 07/26/12 [phone]

## 2012-07-26 NOTE — Telephone Encounter (Signed)
pt advised to go to ED, states foot is worse

## 2012-07-26 NOTE — Telephone Encounter (Signed)
Please advise so I can call pt back

## 2012-07-26 NOTE — ED Notes (Signed)
Swelling and redness to lower legs. Pt states phlebitis. Symptoms first began Nov. 16.

## 2012-07-26 NOTE — ED Notes (Signed)
Pt presents with bilateral lower leg swelling and redness, +3 edema noted. Pt states has been "dealing with this since 89 th of November. Denies fever.

## 2012-08-02 ENCOUNTER — Other Ambulatory Visit: Payer: Self-pay

## 2012-08-02 DIAGNOSIS — M549 Dorsalgia, unspecified: Secondary | ICD-10-CM

## 2012-08-02 MED ORDER — METHADONE HCL 10 MG PO TABS: 10.0000 mg | ORAL_TABLET | Freq: Three times a day (TID) | ORAL | Status: DC

## 2012-08-05 ENCOUNTER — Telehealth: Payer: Self-pay | Admitting: Family Medicine

## 2012-08-08 ENCOUNTER — Ambulatory Visit (INDEPENDENT_AMBULATORY_CARE_PROVIDER_SITE_OTHER): Payer: Medicare Other | Admitting: Family Medicine

## 2012-08-08 ENCOUNTER — Encounter: Payer: Self-pay | Admitting: Family Medicine

## 2012-08-08 VITALS — BP 130/62 | HR 76 | Resp 16 | Ht 63.0 in | Wt 211.1 lb

## 2012-08-08 DIAGNOSIS — E785 Hyperlipidemia, unspecified: Secondary | ICD-10-CM | POA: Diagnosis not present

## 2012-08-08 DIAGNOSIS — I10 Essential (primary) hypertension: Secondary | ICD-10-CM

## 2012-08-08 DIAGNOSIS — I739 Peripheral vascular disease, unspecified: Secondary | ICD-10-CM

## 2012-08-08 DIAGNOSIS — F411 Generalized anxiety disorder: Secondary | ICD-10-CM

## 2012-08-08 DIAGNOSIS — M79609 Pain in unspecified limb: Secondary | ICD-10-CM

## 2012-08-08 DIAGNOSIS — M159 Polyosteoarthritis, unspecified: Secondary | ICD-10-CM

## 2012-08-08 DIAGNOSIS — E039 Hypothyroidism, unspecified: Secondary | ICD-10-CM

## 2012-08-08 DIAGNOSIS — M79662 Pain in left lower leg: Secondary | ICD-10-CM

## 2012-08-08 NOTE — Telephone Encounter (Signed)
Patient is aware of the appointment. 

## 2012-08-08 NOTE — Patient Instructions (Addendum)
F/u as before.  Please call if swelling of left knee worsens, or if your legs become more red and painful once more.  Fasting lipid, cmp and EGFR, TSH end January  Please keep appt with vein specialist in [place], also when you go let them evaluate the veins up to the groin  All the best for 2014

## 2012-08-08 NOTE — Progress Notes (Signed)
  Subjective:    Patient ID: Katelyn Rowland, female    DOB: Oct 20, 1940, 72 y.o.   MRN: [id number]  HPI Pt in for review of severe leg swelling, left greater than right with no acute DVt, has chronic left DVT. SHe ended up in the Ed, is just completing a course of antibiotic with great benefit. I still concerned about where she a bit of peeling of the skin where it was inflamed, but denies significant warmth, redness or pain Has upcoming vascular evaluation, which I have advised her to keep   Review of Systems See HPI Denies recent fever or chills. Denies sinus pressure, nasal congestion, ear pain or sore throat. Denies chest congestion, productive cough or wheezing. Denies chest pains, palpitations and leg swelling Denies abdominal pain, nausea, vomiting,diarrhea or constipation.   Denies dysuria, frequency, hesitancy or incontinence. Chronic  joint pain,  and limitation in mobility. Denies headaches, seizures, numbness, or tingling. Denies depression, anxiety or insomnia. .        Objective:   Physical Exam Patient alert and oriented and in no cardiopulmonary distress.  HEENT: No facial asymmetry, EOMI, no sinus tenderness,  oropharynx pink and moist.  Neck supple no adenopathy.  Chest: Clear to auscultation bilaterally.  CVS: S1, S2 no murmurs, no S3.  ABD: Soft non tender. Bowel sounds normal.  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  Skin:mild erythema, no warmth or tenderness of lower extremities, healed areas where skin recently peeled Psych: Good eye contact, normal affect. Memory intact not anxious or depressed appearing.  CNS: CN 2-12 intact, power, tone and sensation normal throughout.        Assessment & Plan:

## 2012-08-11 DIAGNOSIS — I739 Peripheral vascular disease, unspecified: Secondary | ICD-10-CM | POA: Insufficient documentation

## 2012-08-11 NOTE — Assessment & Plan Note (Signed)
Controlled, no change in medication DASH diet and commitment to daily physical activity for a minimum of 30 minutes discussed and encouraged, as a part of hypertension management. The importance of attaining a healthy weight is also discussed.  

## 2012-08-11 NOTE — Assessment & Plan Note (Signed)
Bilateral varicose veins of lower extremities, with chronic leg swelling, upcoming appt with [facility] to be kept

## 2012-08-11 NOTE — Assessment & Plan Note (Signed)
Improved with marked eduction in edema Was also treated for cellulitis in the ED in the interim, which alleviated symptoms, has appt with vascular later this month

## 2012-08-11 NOTE — Assessment & Plan Note (Signed)
Chronic pain management  Unchanged, and is through this office

## 2012-08-11 NOTE — Assessment & Plan Note (Signed)
Updated lab needed, uncontrolled when last checked

## 2012-08-11 NOTE — Assessment & Plan Note (Signed)
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

## 2012-08-11 NOTE — Assessment & Plan Note (Signed)
Uncontrolled, med adjustment has been made and pt to be re evaluated, she reports compliance

## 2012-08-12 ENCOUNTER — Other Ambulatory Visit: Payer: Self-pay | Admitting: Family Medicine

## 2012-08-12 ENCOUNTER — Ambulatory Visit: Payer: Medicare Other | Admitting: Family Medicine

## 2012-08-19 ENCOUNTER — Encounter: Payer: Self-pay | Admitting: Vascular Surgery

## 2012-08-20 ENCOUNTER — Encounter: Payer: Medicare Other | Admitting: Vascular Surgery

## 2012-08-28 ENCOUNTER — Other Ambulatory Visit: Payer: Self-pay | Admitting: Family Medicine

## 2012-08-29 ENCOUNTER — Telehealth: Payer: Self-pay | Admitting: Family Medicine

## 2012-08-29 MED ORDER — ESOMEPRAZOLE MAGNESIUM 40 MG PO CPDR: 40.0000 mg | DELAYED_RELEASE_CAPSULE | Freq: Every day | ORAL | Status: DC

## 2012-08-29 NOTE — Telephone Encounter (Signed)
Sent in

## 2012-09-05 ENCOUNTER — Ambulatory Visit: Payer: Medicare Other | Admitting: Family Medicine

## 2012-09-06 ENCOUNTER — Encounter: Payer: Medicare Other | Admitting: Vascular Surgery

## 2012-09-06 ENCOUNTER — Other Ambulatory Visit: Payer: Self-pay

## 2012-09-06 DIAGNOSIS — M549 Dorsalgia, unspecified: Secondary | ICD-10-CM

## 2012-09-06 MED ORDER — METHADONE HCL 10 MG PO TABS: 10.0000 mg | ORAL_TABLET | Freq: Three times a day (TID) | ORAL | Status: DC

## 2012-09-11 ENCOUNTER — Encounter: Payer: Self-pay | Admitting: Family Medicine

## 2012-09-11 ENCOUNTER — Ambulatory Visit (INDEPENDENT_AMBULATORY_CARE_PROVIDER_SITE_OTHER): Payer: Medicare Other | Admitting: Family Medicine

## 2012-09-11 VITALS — BP 120/68 | HR 73 | Resp 18 | Wt 203.1 lb

## 2012-09-11 DIAGNOSIS — M549 Dorsalgia, unspecified: Secondary | ICD-10-CM

## 2012-09-11 DIAGNOSIS — E785 Hyperlipidemia, unspecified: Secondary | ICD-10-CM

## 2012-09-11 DIAGNOSIS — G4700 Insomnia, unspecified: Secondary | ICD-10-CM

## 2012-09-11 DIAGNOSIS — E039 Hypothyroidism, unspecified: Secondary | ICD-10-CM

## 2012-09-11 DIAGNOSIS — I10 Essential (primary) hypertension: Secondary | ICD-10-CM | POA: Diagnosis not present

## 2012-09-11 DIAGNOSIS — E669 Obesity, unspecified: Secondary | ICD-10-CM | POA: Diagnosis not present

## 2012-09-11 NOTE — Progress Notes (Signed)
  Subjective:    Patient ID: Katelyn Rowland, female    DOB: 1941-03-13, 72 y.o.   MRN: [id number]  HPI  The PT is here for follow up and re-evaluation of chronic medical conditions, medication management and review of any available recent lab and radiology data.  Preventive health is updated, specifically  Cancer screening and Immunization.   Had neck surgery last Fall states it has helped her symptoms. The PT denies any adverse reactions to current medications since the last visit.  C/o reduced mobility      Review of Systems    See HPI Denies recent fever or chills. Denies sinus pressure, nasal congestion, ear pain or sore throat. Denies chest congestion, productive cough or wheezing. Denies chest pains, palpitations and leg swelling Denies abdominal pain, nausea, vomiting,diarrhea or constipation.   Denies dysuria, frequency, hesitancy or incontinence.  Denies headaches, seizures, Denies depression, anxiety or uncontrolled insomnia. Denies skin break down or rash.     Objective:   Physical Exam Patient alert and oriented and in no cardiopulmonary distress.  HEENT: No facial asymmetry, EOMI, no sinus tenderness,  oropharynx pink and moist.  Neck decreased though adequate ROM, no adenopathy.  Chest: Clear to auscultation bilaterally.  CVS: S1, S2 no murmurs, no S3.  ABD: Soft non tender. Bowel sounds normal.  Ext: No edema  MS: Adequate though reduced  ROM spine, shoulders, hips and knees.  Skin: Intact, no ulcerations or rash noted.  Psych: Good eye contact, normal affect. Memory intact not anxious or depressed appearing.  CNS: CN 2-12 intact, power,  normal throughout.        Assessment & Plan:

## 2012-09-11 NOTE — Patient Instructions (Addendum)
F/u in  4.5 month  Fasting lipid, cmp and RGFR, TSH  As soon as possible   TSH in 4 month  Please continue to be as active as possible, be careful not to fall

## 2012-09-17 ENCOUNTER — Other Ambulatory Visit: Payer: Self-pay

## 2012-09-17 MED ORDER — PROMETHAZINE HCL 25 MG PO TABS: ORAL_TABLET | ORAL | Status: DC

## 2012-09-20 NOTE — Assessment & Plan Note (Signed)
Improved. Pt applauded on succesful weight loss through lifestyle change, and encouraged to continue same. Weight loss goal set for the next several months.  

## 2012-09-20 NOTE — Assessment & Plan Note (Signed)
Unchanged, continue chronic meds 

## 2012-09-20 NOTE — Assessment & Plan Note (Signed)
Under corrected and ha had recent dose adjustment, will f/u labs in the next several weeks

## 2012-09-20 NOTE — Assessment & Plan Note (Signed)
Sleep hygiene reviewed, continue trazodone

## 2012-09-20 NOTE — Assessment & Plan Note (Signed)
Updated lab past due, will be tested with next labs Hyperlipidemia:Low fat diet discussed and encouraged.

## 2012-09-20 NOTE — Assessment & Plan Note (Signed)
Controlled, no change in medication DASH diet and commitment to daily physical activity for a minimum of 30 minutes discussed and encouraged, as a part of hypertension management. The importance of attaining a healthy weight is also discussed.  

## 2012-09-30 ENCOUNTER — Telehealth: Payer: Self-pay | Admitting: Family Medicine

## 2012-10-01 MED ORDER — ESOMEPRAZOLE MAGNESIUM 40 MG PO CPDR: 40.0000 mg | DELAYED_RELEASE_CAPSULE | Freq: Every day | ORAL | Status: DC

## 2012-10-01 NOTE — Telephone Encounter (Signed)
Will send in nexium rx

## 2012-10-02 ENCOUNTER — Other Ambulatory Visit: Payer: Self-pay | Admitting: Family Medicine

## 2012-10-02 ENCOUNTER — Encounter: Payer: Self-pay | Admitting: Vascular Surgery

## 2012-10-02 DIAGNOSIS — E039 Hypothyroidism, unspecified: Secondary | ICD-10-CM | POA: Diagnosis not present

## 2012-10-02 DIAGNOSIS — I10 Essential (primary) hypertension: Secondary | ICD-10-CM | POA: Diagnosis not present

## 2012-10-02 DIAGNOSIS — E785 Hyperlipidemia, unspecified: Secondary | ICD-10-CM | POA: Diagnosis not present

## 2012-10-02 LAB — TSH: TSH: 0.227 u[IU]/mL — ABNORMAL LOW (ref 0.350–4.500)

## 2012-10-02 LAB — COMPLETE METABOLIC PANEL WITH GFR
Alkaline Phosphatase: 54 U/L (ref 39–117)
BUN: 24 mg/dL — ABNORMAL HIGH (ref 6–23)
CO2: 31 mEq/L (ref 19–32)
Creat: 1.04 mg/dL (ref 0.50–1.10)
GFR, Est African American: 62 mL/min
GFR, Est Non African American: 54 mL/min — ABNORMAL LOW
Glucose, Bld: 85 mg/dL (ref 70–99)
Total Bilirubin: 0.4 mg/dL (ref 0.3–1.2)
Total Protein: 6.7 g/dL (ref 6.0–8.3)

## 2012-10-02 LAB — LIPID PANEL
Cholesterol: 225 mg/dL — ABNORMAL HIGH (ref 0–200)
HDL: 40 mg/dL (ref >39)
Total CHOL/HDL Ratio: 5.6 Ratio
Triglycerides: 226 mg/dL — ABNORMAL HIGH (ref <150)

## 2012-10-03 ENCOUNTER — Encounter (INDEPENDENT_AMBULATORY_CARE_PROVIDER_SITE_OTHER): Payer: Medicare Other | Admitting: Vascular Surgery

## 2012-10-03 ENCOUNTER — Ambulatory Visit (INDEPENDENT_AMBULATORY_CARE_PROVIDER_SITE_OTHER): Payer: Medicare Other | Admitting: Vascular Surgery

## 2012-10-03 ENCOUNTER — Encounter: Payer: Self-pay | Admitting: Vascular Surgery

## 2012-10-03 VITALS — BP 157/84 | HR 59 | Ht 63.0 in | Wt 197.4 lb

## 2012-10-03 DIAGNOSIS — M7989 Other specified soft tissue disorders: Secondary | ICD-10-CM | POA: Diagnosis not present

## 2012-10-03 DIAGNOSIS — Z86718 Personal history of other venous thrombosis and embolism: Secondary | ICD-10-CM | POA: Diagnosis not present

## 2012-10-03 DIAGNOSIS — I8010 Phlebitis and thrombophlebitis of unspecified femoral vein: Secondary | ICD-10-CM

## 2012-10-03 DIAGNOSIS — I8012 Phlebitis and thrombophlebitis of left femoral vein: Secondary | ICD-10-CM

## 2012-10-03 NOTE — Progress Notes (Signed)
[facility] HISTORY AND PHYSICAL   History of Present Illness:  Patient is a 72 y.o. year old female who presents for evaluation of chronic leg swelling and chronic left lower extremity DVT. The patient is referred by Dr. Tula Nakayama. The patient has a known history of left lower extremity DVT. This was in 2010. There is no obvious risk factor at that time. The patient was briefly treated with anticoagulation. However she apparently developed a hemarthrosis and this was discontinued. She had an IVC filter placed. She apparently had increased swelling of her lower extremities left greater than right recently. This was severe enough that she developed some skin weeping. She denies any active ulcerations. She denies any claudication symptoms. She has chronic lower extremity edema. She states that Dr. Moshe Cipro also wanted to check some varicosities across her left groin region. These are chronic. They have been present since 2010.Marland Kitchen  Other medical problems include arthritis, reflux, anxiety, depression, hypertension, diabetes.  All these are currently stable.  Past Medical History  Diagnosis Date  . Arthritis   . Edema of both legs   . Neck pain   . Thyroid disorder   . Reflux   . Depression   . Anxiety   . 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 Hematoma of leg     left   . PONV (postoperative nausea and vomiting)   . Headache   . Hypertension     margaret simpson  [id number]  . High blood pressure   . GERD (gastroesophageal reflux disease)   . Muscle cramps   . Diabetes mellitus 2009    no meds    Past Surgical History  Procedure Laterality Date  . Tonsillectomy    . Tonsillectomy and adenoidectomy    . Cervical spine surgery    . Vesicovaginal fistula closure w/ tah    . Unilateal oopherectomy    . Appendectomy    . Foot neuroma surgery      right   . Nasal sinus surgery    . Ivc filter placement,  following recurrent left dvt  07/2009  . Thoracic discectomy  06/12/2012    Procedure: THORACIC DISCECTOMY;  Surgeon: Floyce Stakes, MD;  Location: [facility];  Service: Neurosurgery;  Laterality: N/A;  Thoracic [employer] Thoracic Laminectomy     Social History History  Substance Use Topics  . Smoking status: Former Smoker    Types: Cigarettes    Quit date: 06/04/2010  . Smokeless tobacco: Not on file  . Alcohol Use: No    Family History Family History  Problem Relation Age of Onset  . Emphysema Sister   . Diabetes Mother   . Diabetes Father   . Arthritis    . Kidney disease      Allergies  Allergies  Allergen Reactions  . Prednisone     ONLY THE CORTISONE INJECTIONS. Causes body to burn, like blood is burning body.   . Acetaminophen Other (See Comments)    Muscle Cramps.   . Warfarin Sodium     REACTION: pt devloped lrge hematoma onleg while on coumadin     Current Outpatient Prescriptions  Medication Sig Dispense Refill  . alendronate (FOSAMAX) 70 MG tablet Take 70 mg by mouth every 7 (seven) days. Take with a full glass of water on an empty stomach. Mondays      . benazepril (LOTENSIN) 40 MG tablet Take 40 mg  by mouth daily.      . calcium-vitamin D (OSCAL 500/200 D-3) 500-200 MG-UNIT per tablet Take 1 tablet by mouth 3 (three) times daily.  90 tablet  11  . esomeprazole (NEXIUM) 40 MG capsule Take 1 capsule (40 mg total) by mouth daily before breakfast.  90 capsule  3  . gabapentin (NEURONTIN) 100 MG capsule TAKE (1) CAPSULE BY MOUTH AT BEDTIME.  30 capsule  1  . levothyroxine (SYNTHROID) 200 MCG tablet Take 1 tablet (200 mcg total) by mouth daily.  30 tablet  11  . levothyroxine (SYNTHROID, LEVOTHROID) 50 MCG tablet Take 50 mcg by mouth daily.      Marland Kitchen lovastatin (MEVACOR) 20 MG tablet Take 20 mg by mouth every Monday, Wednesday, and Friday.      . methadone (DOLOPHINE) 10 MG tablet Take 1 tablet (10 mg total) by mouth every 8 (eight) hours.  90 tablet  0  .  polyethylene glycol powder (GLYCOLAX/MIRALAX) powder Take 17 g by mouth daily.      . promethazine (PHENERGAN) 25 MG tablet TAKE 1 TABLET BY MOUTH EVERY 6 HOURS AS NEEDED.  30 tablet  3  . traZODone (DESYREL) 50 MG tablet Take 50 mg by mouth at bedtime.       No current facility-administered medications for this visit.    ROS:   General:  No weight loss, Fever, chills  HEENT: No recent headaches, no nasal bleeding, no visual changes, no sore throat  Neurologic: No dizziness, blackouts, seizures. No recent symptoms of stroke or mini- stroke. No recent episodes of slurred speech, or temporary blindness.  Cardiac: No recent episodes of chest pain/pressure, no shortness of breath at rest.  + shortness of breath with exertion.  Denies history of atrial fibrillation or irregular heartbeat  Vascular: No history of rest pain in feet.  No history of claudication.  No history of non-healing ulcer, + history of DVT   Pulmonary: No home oxygen, no productive cough, no hemoptysis,  No asthma or wheezing  Musculoskeletal:  [x ] Arthritis, [ ]  Low back pain,  [ ]  Joint pain  Hematologic:No history of hypercoagulable state.  No history of easy bleeding.  No history of anemia  Gastrointestinal: No hematochezia or melena,  + gastroesophageal reflux, no trouble swallowing  Urinary: [ ]  chronic Kidney disease, [ ]  on HD - [ ]  MWF or [ ]  TTHS, [ ]  Burning with urination, [ ]  Frequent urination, [ ]  Difficulty urinating;   Skin: No rashes  Psychological: + history of anxiety,  +history of depression   Physical Examination  Filed Vitals:   10/03/12 1433  BP: 157/84  Pulse: 59  Height: 5\' 3"  (1.6 m)  Weight: 197 lb 6.4 oz (89.54 kg)  SpO2: 98%    Body mass index is 34.98 kg/(m^2).  General:  Alert and oriented, no acute distress HEENT: Normal Neck: No bruit or JVD Pulmonary: Clear to auscultation bilaterally Cardiac: Regular Rate and Rhythm without murmur Abdomen: Soft, non-tender,  non-distended, no mass, multiple lower midline scars, varicosities across the left groin and upper midpelvis 3-4 mm in diameter Skin: No rash, no ulcers Extremity Pulses:  2+ radial, brachial, femoral, 2+ dorsalis pedis right foot, 1+ left foot Musculoskeletal: No deformity bilateral edema with some pitting skin changes consistent with chronic edema  Neurologic: Upper and lower extremity motor 5/5 and symmetric  DATA: The patient had a left lower extremity venous duplex exam today. This shows chronic extensive DVT in the common femoral superficial femoral  veins there is also some reflux in the greater saphenous vein.  CT scan of abdomen and pelvis from 2011 is reviewed which showed these large pelvic varicosities at that time. 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 I do not believe she has a significant arterial occlusive disease component. I discussed with the patient today the pathophysiology of postphlebitic syndrome. She will most likely have chronic intermittent exacerbations and remissions of swelling. I discussed with her compression therapy would be the mainstay of therapy. She has tried this in the past but has been somewhat noncompliant because of difficulty wearing compression stockings. She is going to try to see if she can find some that are easier to get on. Although she does have reflux in her left greater saphenous system I would not consider ablation of this due to the fact that her deep system does not work very well and this would lead to more swelling.   PLAN:  Recommend bilateral lower extremity compression stockings. Leg elevation and possible. Since she has bilateral lower extremity edema there is always the possibility that her filter could have some narrowing or occlusion. However, she is not very debilitated by her symptoms and I do not believe this warrants further his investigation unless her  symptoms became worse over time since we would not consider an intervention on the filter and it would be for a purely diagnostic purposes.  The patient will followup on as-needed basis.  Ruta Hinds, MD [facility] Office: [phone] Pager: [phone]

## 2012-10-04 ENCOUNTER — Other Ambulatory Visit: Payer: Self-pay | Admitting: Family Medicine

## 2012-10-04 ENCOUNTER — Other Ambulatory Visit: Payer: Self-pay

## 2012-10-04 DIAGNOSIS — M549 Dorsalgia, unspecified: Secondary | ICD-10-CM

## 2012-10-04 MED ORDER — METHADONE HCL 10 MG PO TABS: 10.0000 mg | ORAL_TABLET | Freq: Three times a day (TID) | ORAL | Status: DC

## 2012-10-10 ENCOUNTER — Other Ambulatory Visit: Payer: Self-pay | Admitting: Family Medicine

## 2012-10-14 ENCOUNTER — Telehealth: Payer: Self-pay

## 2012-10-14 DIAGNOSIS — E785 Hyperlipidemia, unspecified: Secondary | ICD-10-CM

## 2012-10-14 DIAGNOSIS — E039 Hypothyroidism, unspecified: Secondary | ICD-10-CM

## 2012-10-14 DIAGNOSIS — I10 Essential (primary) hypertension: Secondary | ICD-10-CM

## 2012-10-14 NOTE — Telephone Encounter (Signed)
Labs ordered per Dr

## 2012-10-24 ENCOUNTER — Other Ambulatory Visit: Payer: Self-pay

## 2012-10-24 ENCOUNTER — Telehealth: Payer: Self-pay | Admitting: Family Medicine

## 2012-10-24 MED ORDER — LOVASTATIN 20 MG PO TABS: 20.0000 mg | ORAL_TABLET | Freq: Every day | ORAL | Status: DC

## 2012-10-24 NOTE — Telephone Encounter (Signed)
Prescription sent in with new sig indicating to take daily.

## 2012-11-04 ENCOUNTER — Other Ambulatory Visit: Payer: Self-pay

## 2012-11-04 DIAGNOSIS — M549 Dorsalgia, unspecified: Secondary | ICD-10-CM

## 2012-11-04 MED ORDER — METHADONE HCL 10 MG PO TABS: 10.0000 mg | ORAL_TABLET | Freq: Three times a day (TID) | ORAL | Status: DC

## 2012-11-28 ENCOUNTER — Other Ambulatory Visit: Payer: Self-pay

## 2012-11-28 DIAGNOSIS — M549 Dorsalgia, unspecified: Secondary | ICD-10-CM

## 2012-11-28 MED ORDER — METHADONE HCL 10 MG PO TABS: 10.0000 mg | ORAL_TABLET | Freq: Three times a day (TID) | ORAL | Status: DC

## 2012-11-29 ENCOUNTER — Other Ambulatory Visit: Payer: Self-pay | Admitting: Family Medicine

## 2012-12-03 ENCOUNTER — Encounter: Payer: Self-pay | Admitting: *Deleted

## 2012-12-11 ENCOUNTER — Other Ambulatory Visit: Payer: Self-pay | Admitting: Family Medicine

## 2012-12-19 ENCOUNTER — Other Ambulatory Visit: Payer: Self-pay | Admitting: Family Medicine

## 2012-12-27 ENCOUNTER — Other Ambulatory Visit: Payer: Self-pay

## 2012-12-27 DIAGNOSIS — M549 Dorsalgia, unspecified: Secondary | ICD-10-CM

## 2012-12-27 MED ORDER — METHADONE HCL 10 MG PO TABS: 10.0000 mg | ORAL_TABLET | Freq: Three times a day (TID) | ORAL | Status: DC

## 2012-12-31 ENCOUNTER — Other Ambulatory Visit: Payer: Self-pay | Admitting: Family Medicine

## 2013-01-15 ENCOUNTER — Ambulatory Visit (INDEPENDENT_AMBULATORY_CARE_PROVIDER_SITE_OTHER): Payer: Medicare Other | Admitting: Family Medicine

## 2013-01-15 ENCOUNTER — Ambulatory Visit: Payer: Self-pay | Admitting: Family Medicine

## 2013-01-15 VITALS — BP 130/68 | HR 62 | Resp 18 | Ht 63.0 in | Wt 201.0 lb

## 2013-01-15 DIAGNOSIS — G4700 Insomnia, unspecified: Secondary | ICD-10-CM

## 2013-01-15 DIAGNOSIS — R7309 Other abnormal glucose: Secondary | ICD-10-CM

## 2013-01-15 DIAGNOSIS — F411 Generalized anxiety disorder: Secondary | ICD-10-CM

## 2013-01-15 DIAGNOSIS — E669 Obesity, unspecified: Secondary | ICD-10-CM | POA: Diagnosis not present

## 2013-01-15 DIAGNOSIS — E785 Hyperlipidemia, unspecified: Secondary | ICD-10-CM | POA: Diagnosis not present

## 2013-01-15 DIAGNOSIS — R7302 Impaired glucose tolerance (oral): Secondary | ICD-10-CM

## 2013-01-15 DIAGNOSIS — E039 Hypothyroidism, unspecified: Secondary | ICD-10-CM | POA: Diagnosis not present

## 2013-01-15 DIAGNOSIS — R7301 Impaired fasting glucose: Secondary | ICD-10-CM

## 2013-01-15 DIAGNOSIS — R5381 Other malaise: Secondary | ICD-10-CM

## 2013-01-15 DIAGNOSIS — I10 Essential (primary) hypertension: Secondary | ICD-10-CM | POA: Diagnosis not present

## 2013-01-15 DIAGNOSIS — R5383 Other fatigue: Secondary | ICD-10-CM

## 2013-01-15 NOTE — Progress Notes (Signed)
  Subjective:    Patient ID: Katelyn Rowland, female    DOB: 08-04-1941, 72 y.o.   MRN: [id number]  HPI The PT is here for follow up and re-evaluation of chronic medical conditions, medication management and review of any available recent lab and radiology data.  Preventive health is updated, specifically  Cancer screening and Immunization.   Questions or concerns regarding consultations or procedures which the PT has had in the interim are  Addressed.Has recently been seen by vascular surgeon, hose have been recommended, she has difficulty putting them on and increased difficulty at home, with cleaning and basic chors due to severe osteoarthritis, requesting help if available The PT denies any adverse reactions to current medications since the last visit.  There are no new concerns.      Review of Systems See HPI Denies recent fever or chills. Denies sinus pressure, nasal congestion, ear pain or sore throat. Denies chest congestion, productive cough or wheezing. Denies chest pains, palpitations and leg swelling Denies abdominal pain, nausea, vomiting,diarrhea or constipation.   Denies dysuria, frequency, hesitancy or incontinence. Progressive  joint pain, swelling and limitation in mobility. Denies headaches, seizures, numbness, or tingling. Denies depression, anxiety or insomnia. Denies skin break down or rash.        Objective:   Physical Exam  Patient alert and oriented and in no cardiopulmonary distress.  HEENT: No facial asymmetry, EOMI, no sinus tenderness,  oropharynx pink and moist.  Neck supple no adenopathy.  Chest: Clear to auscultation bilaterally.  CVS: S1, S2 no murmurs, no S3.  ABD: Soft non tender. Bowel sounds normal.  Ext: No edema  MS: decreased  ROM spine, shoulders, hips and knees.  Skin: Intact, no ulcerations or rash noted.  Psych: Good eye contact, normal affect. Memory intact not anxious or depressed appearing.  CNS: CN 2-12 intact, power,  tone and sensation normal throughout.       Assessment & Plan:

## 2013-01-15 NOTE — Patient Instructions (Addendum)
Annual wellness in 4.5 month  Call if you need me before   You are doing fairly well, but i realize you need help with putting on hose and in the home , we will refer you [facility] for further eval;ation if they work with your insurance company  TSH today  Fasting lipid, cmp, TSH , hBA1C in 4.5 month, before next visit  Script for compression hose provided, and we will check into anything you can get to help pull up the hose and let you know

## 2013-01-16 ENCOUNTER — Ambulatory Visit: Payer: Medicare Other | Admitting: Family Medicine

## 2013-01-16 LAB — TSH: TSH: 2.375 u[IU]/mL (ref 0.350–4.500)

## 2013-01-25 ENCOUNTER — Encounter: Payer: Self-pay | Admitting: Family Medicine

## 2013-01-25 DIAGNOSIS — E1169 Type 2 diabetes mellitus with other specified complication: Secondary | ICD-10-CM | POA: Insufficient documentation

## 2013-01-25 DIAGNOSIS — R7303 Prediabetes: Secondary | ICD-10-CM | POA: Insufficient documentation

## 2013-01-25 NOTE — Assessment & Plan Note (Signed)
Controlled, no change in medication  

## 2013-01-25 NOTE — Assessment & Plan Note (Signed)
Improved,sleep hygiene discussed

## 2013-01-25 NOTE — Assessment & Plan Note (Signed)
Patient educated about the importance of limiting  Carbohydrate intake , the need to commit to daily physical activity for a minimum of 30 minutes , and to commit weight loss. The fact that changes in all these areas will reduce or eliminate all together the development of diabetes is stressed.   Updated lab next visit

## 2013-01-25 NOTE — Assessment & Plan Note (Signed)
Controlled, no change in medication DASH diet and commitment to daily physical activity for a minimum of 30 minutes discussed and encouraged, as a part of hypertension management. The importance of attaining a healthy weight is also discussed.  

## 2013-01-25 NOTE — Assessment & Plan Note (Signed)
Uncontrolled Hyperlipidemia:Low fat diet discussed and encouraged.  Updated lab for next visit

## 2013-01-25 NOTE — Assessment & Plan Note (Signed)
Deteriorated. Patient re-educated about  the importance of commitment to a  minimum of 150 minutes of exercise per week. The importance of healthy food choices with portion control discussed. Encouraged to start a food diary, count calories and to consider  joining a support group. Sample diet sheets offered. Goals set by the patient for the next several months.    

## 2013-01-31 ENCOUNTER — Other Ambulatory Visit: Payer: Self-pay

## 2013-01-31 DIAGNOSIS — M549 Dorsalgia, unspecified: Secondary | ICD-10-CM

## 2013-01-31 MED ORDER — METHADONE HCL 10 MG PO TABS: 10.0000 mg | ORAL_TABLET | Freq: Three times a day (TID) | ORAL | Status: DC

## 2013-02-14 ENCOUNTER — Telehealth: Payer: Self-pay

## 2013-02-14 NOTE — Telephone Encounter (Signed)
Since cannot be fitted, just advise use of OTC compression socks that shwe will be abble to put on and take off herself, thanks

## 2013-02-19 ENCOUNTER — Telehealth: Payer: Self-pay | Admitting: Family Medicine

## 2013-02-19 NOTE — Telephone Encounter (Signed)
rx mailed to her as requested

## 2013-02-19 NOTE — Telephone Encounter (Signed)
Script written pls send

## 2013-02-19 NOTE — Telephone Encounter (Signed)
Pt aware that rx will be mailed to her as requested

## 2013-02-23 NOTE — Telephone Encounter (Signed)
See next msg  

## 2013-02-27 ENCOUNTER — Other Ambulatory Visit: Payer: Self-pay

## 2013-02-27 DIAGNOSIS — M549 Dorsalgia, unspecified: Secondary | ICD-10-CM

## 2013-02-27 MED ORDER — METHADONE HCL 10 MG PO TABS: 10.0000 mg | ORAL_TABLET | Freq: Three times a day (TID) | ORAL | Status: DC

## 2013-02-28 ENCOUNTER — Other Ambulatory Visit: Payer: Self-pay | Admitting: Family Medicine

## 2013-03-13 ENCOUNTER — Other Ambulatory Visit: Payer: Self-pay | Admitting: Family Medicine

## 2013-03-21 ENCOUNTER — Other Ambulatory Visit: Payer: Self-pay | Admitting: Family Medicine

## 2013-03-28 ENCOUNTER — Other Ambulatory Visit: Payer: Self-pay

## 2013-03-28 DIAGNOSIS — M549 Dorsalgia, unspecified: Secondary | ICD-10-CM

## 2013-03-28 MED ORDER — METHADONE HCL 10 MG PO TABS: 10.0000 mg | ORAL_TABLET | Freq: Three times a day (TID) | ORAL | Status: DC

## 2013-04-03 ENCOUNTER — Other Ambulatory Visit: Payer: Self-pay | Admitting: Family Medicine

## 2013-04-21 ENCOUNTER — Other Ambulatory Visit: Payer: Self-pay | Admitting: Family Medicine

## 2013-04-24 ENCOUNTER — Other Ambulatory Visit: Payer: Self-pay

## 2013-04-24 DIAGNOSIS — M549 Dorsalgia, unspecified: Secondary | ICD-10-CM

## 2013-04-24 MED ORDER — METHADONE HCL 10 MG PO TABS: 10.0000 mg | ORAL_TABLET | Freq: Three times a day (TID) | ORAL | Status: DC

## 2013-04-29 ENCOUNTER — Ambulatory Visit: Payer: Medicare Other | Admitting: Family Medicine

## 2013-05-13 ENCOUNTER — Telehealth: Payer: Self-pay | Admitting: Family Medicine

## 2013-05-13 DIAGNOSIS — R7301 Impaired fasting glucose: Secondary | ICD-10-CM

## 2013-05-13 DIAGNOSIS — R5381 Other malaise: Secondary | ICD-10-CM

## 2013-05-13 DIAGNOSIS — E669 Obesity, unspecified: Secondary | ICD-10-CM

## 2013-05-13 DIAGNOSIS — I10 Essential (primary) hypertension: Secondary | ICD-10-CM

## 2013-05-13 DIAGNOSIS — E039 Hypothyroidism, unspecified: Secondary | ICD-10-CM

## 2013-05-13 DIAGNOSIS — R5383 Other fatigue: Secondary | ICD-10-CM

## 2013-05-13 DIAGNOSIS — E785 Hyperlipidemia, unspecified: Secondary | ICD-10-CM

## 2013-05-13 NOTE — Telephone Encounter (Signed)
Wants to increase methadone to four times a day. Currently takes 3 a day. Yesterday she took a 4th one due to her pain. States they do not last 8 hours. Hurting terribly in her back. Has gotten worse this month, especially at night she wakes up in pain

## 2013-05-13 NOTE — Telephone Encounter (Signed)
Needs to make and appointment and keep it. Missed the last. None on schedule. Needs fasting lipid, cmp , hBA1C and TSh e before the visit. Unable to make ANY adjustments to pain management without an evaluation in the office , if I am able to, so she needs to stay on the prescribed dose, no change will be made without clinical re evaluation, psl explain

## 2013-05-13 NOTE — Telephone Encounter (Signed)
L;et pt inow she needs to stay on prescribed dose, and may need to go for epidural  To help with pain, or to pain clinic please

## 2013-05-13 NOTE — Telephone Encounter (Signed)
Patient states that she is unable to tolerate steroids and had to be hospitalized before after a similar injection.  Please advise.

## 2013-05-14 NOTE — Telephone Encounter (Signed)
Called patient and left message. Ordered labs to be done before visit

## 2013-05-14 NOTE — Telephone Encounter (Signed)
Patient will call [facility] and call back and schedule. Lab sent to [facility] to be done before visit

## 2013-05-21 DIAGNOSIS — R5381 Other malaise: Secondary | ICD-10-CM | POA: Diagnosis not present

## 2013-05-21 DIAGNOSIS — E785 Hyperlipidemia, unspecified: Secondary | ICD-10-CM | POA: Diagnosis not present

## 2013-05-21 DIAGNOSIS — R7301 Impaired fasting glucose: Secondary | ICD-10-CM | POA: Diagnosis not present

## 2013-05-21 DIAGNOSIS — I10 Essential (primary) hypertension: Secondary | ICD-10-CM | POA: Diagnosis not present

## 2013-05-21 LAB — HEMOGLOBIN A1C
Hgb A1c MFr Bld: 5.7 % — ABNORMAL HIGH (ref <5.7)
Mean Plasma Glucose: 117 mg/dL — ABNORMAL HIGH (ref <117)

## 2013-05-22 LAB — COMPREHENSIVE METABOLIC PANEL
ALT: 12 U/L (ref 0–35)
Albumin: 2.9 g/dL — ABNORMAL LOW (ref 3.5–5.2)
CO2: 27 mEq/L (ref 19–32)
Calcium: 8.9 mg/dL (ref 8.4–10.5)
Chloride: 107 mEq/L (ref 96–112)
Creat: 1.22 mg/dL — ABNORMAL HIGH (ref 0.50–1.10)
Potassium: 5.4 mEq/L — ABNORMAL HIGH (ref 3.5–5.3)
Sodium: 141 mEq/L (ref 135–145)
Total Protein: 5.5 g/dL — ABNORMAL LOW (ref 6.0–8.3)

## 2013-05-22 LAB — LIPID PANEL: Cholesterol: 275 mg/dL — ABNORMAL HIGH (ref 0–200)

## 2013-05-22 LAB — TSH: TSH: 4.202 u[IU]/mL (ref 0.350–4.500)

## 2013-05-23 ENCOUNTER — Other Ambulatory Visit: Payer: Self-pay

## 2013-05-23 DIAGNOSIS — M549 Dorsalgia, unspecified: Secondary | ICD-10-CM

## 2013-05-23 MED ORDER — METHADONE HCL 10 MG PO TABS: 10.0000 mg | ORAL_TABLET | Freq: Three times a day (TID) | ORAL | Status: DC

## 2013-05-26 ENCOUNTER — Ambulatory Visit (INDEPENDENT_AMBULATORY_CARE_PROVIDER_SITE_OTHER): Payer: Medicare Other | Admitting: Family Medicine

## 2013-05-26 ENCOUNTER — Encounter: Payer: Self-pay | Admitting: Family Medicine

## 2013-05-26 VITALS — BP 128/82 | HR 68 | Resp 18 | Ht 63.0 in | Wt 196.0 lb

## 2013-05-26 DIAGNOSIS — I739 Peripheral vascular disease, unspecified: Secondary | ICD-10-CM

## 2013-05-26 DIAGNOSIS — E559 Vitamin D deficiency, unspecified: Secondary | ICD-10-CM

## 2013-05-26 DIAGNOSIS — R7302 Impaired glucose tolerance (oral): Secondary | ICD-10-CM

## 2013-05-26 DIAGNOSIS — E785 Hyperlipidemia, unspecified: Secondary | ICD-10-CM

## 2013-05-26 DIAGNOSIS — M549 Dorsalgia, unspecified: Secondary | ICD-10-CM

## 2013-05-26 DIAGNOSIS — R32 Unspecified urinary incontinence: Secondary | ICD-10-CM

## 2013-05-26 DIAGNOSIS — R3912 Poor urinary stream: Secondary | ICD-10-CM | POA: Insufficient documentation

## 2013-05-26 DIAGNOSIS — E039 Hypothyroidism, unspecified: Secondary | ICD-10-CM | POA: Diagnosis not present

## 2013-05-26 DIAGNOSIS — R7309 Other abnormal glucose: Secondary | ICD-10-CM

## 2013-05-26 DIAGNOSIS — I10 Essential (primary) hypertension: Secondary | ICD-10-CM

## 2013-05-26 MED ORDER — LOVASTATIN ER 40 MG PO TB24: 40.0000 mg | ORAL_TABLET | Freq: Every day | ORAL | Status: DC

## 2013-05-26 MED ORDER — SOLIFENACIN SUCCINATE 5 MG PO TABS: 10.0000 mg | ORAL_TABLET | Freq: Every day | ORAL | Status: DC

## 2013-05-26 MED ORDER — GABAPENTIN 300 MG PO CAPS: ORAL_CAPSULE | ORAL | Status: DC

## 2013-05-26 MED ORDER — CELECOXIB 200 MG PO CAPS: 200.0000 mg | ORAL_CAPSULE | Freq: Every day | ORAL | Status: DC

## 2013-05-26 MED ORDER — CYCLOBENZAPRINE HCL 10 MG PO TABS: ORAL_TABLET | ORAL | Status: AC

## 2013-05-26 NOTE — Progress Notes (Signed)
  Subjective:    Patient ID: Katelyn Rowland, female    DOB: Oct 09, 1940, 72 y.o.   MRN: [id number]  HPI  The PT is here for follow up and re-evaluation of chronic medical conditions, medication management and review of any available recent lab and radiology data.  Preventive health is updated, specifically  Cancer screening and Immunization.    The PT denies any adverse reactions to current medications since the last visit.  3 week h/o increased and uncontrolled back pain, no inciting factor, wants pain addressed. C/o burning stomach pain. No blood in stool or change in stool , no hematemesis fotr the past month Improvement in leg pain and swelling noted with [place] of hose     Review of Systems See HPI Denies recent fever or chills. Denies sinus pressure, nasal congestion, ear pain or sore throat. Denies chest congestion, productive cough or wheezing. Denies chest pains, palpitations and leg swelling Denies nausea, vomiting,diarrhea or constipation.   Denies dysuria, frequency, c/o increased and disabling  Incontinence.Some of which is due to limitation in mobility  Denies headaches or  Seizures, c/o  numbness, and  tingling. Denies depression, anxiety or insomnia. Denies skin break down or rash.        Objective:   Physical Exam  Patient alert and oriented and in no cardiopulmonary distress.P tin pain  HEENT: No facial asymmetry, EOMI, no sinus tenderness,  oropharynx pink and moist.  Neck decreased though adequateROm, no adenopathy.  Chest: Clear to auscultation bilaterally.  CVS: S1, S2 no murmurs, no S3.  ABD: Soft non tender. Bowel sounds normal.No palpable organomegaly or mass  Ext: No edema  MS: Marked decreased ROM spine, shoulders, hips and knees.  Skin: Intact, no ulcerations or rash noted.  Psych: Good eye contact, normal affect. Memory intact not anxious or depressed appearing.  CNS: CN 2-12 intact, power, tone and sensation normal throughout.        Assessment & Plan:

## 2013-05-26 NOTE — Patient Instructions (Addendum)
F/u in 4 month, call if you need me before  Increase dose of gabapentin to 300mg  daily  You need to reduce butter, margerine, cheese, egg yolks since your cholesterol is too high  Increase lovastatinn to 40 mg daily  New for back spasm is flexeril one at bedtime, also for back pain , gabapentin dose increased to 300mg  , and instead of ibuprofen, celebrex one daily is prescribed  Call if stomach burning continues or worsens  New for urgency and frequent urination is vesicare one daily  Script for methadone today  Fasting lipid, cmp ,h pylori, cbc, vit D and in 4 month, before visit

## 2013-06-10 ENCOUNTER — Encounter: Payer: Medicare Other | Admitting: Family Medicine

## 2013-06-16 ENCOUNTER — Telehealth: Payer: Self-pay

## 2013-06-20 ENCOUNTER — Other Ambulatory Visit: Payer: Self-pay | Admitting: Family Medicine

## 2013-06-20 ENCOUNTER — Other Ambulatory Visit: Payer: Self-pay

## 2013-06-20 DIAGNOSIS — M549 Dorsalgia, unspecified: Secondary | ICD-10-CM

## 2013-06-20 DIAGNOSIS — R51 Headache: Secondary | ICD-10-CM

## 2013-06-20 MED ORDER — METHADONE HCL 10 MG PO TABS: 10.0000 mg | ORAL_TABLET | Freq: Three times a day (TID) | ORAL | Status: DC

## 2013-06-20 MED ORDER — PREDNISONE (PAK) 5 MG PO TABS: 5.0000 mg | ORAL_TABLET | ORAL | Status: DC

## 2013-06-20 NOTE — Telephone Encounter (Signed)
Patient states that since her last visit she has noticed that the vein in her left temple burns. It has been increasing in frequency and now happens several times throughout the day. Seems to be spreading out some and there is no redness or rash noted in the area. Doesn't know what this could be or what she should do?

## 2013-06-20 NOTE — Telephone Encounter (Signed)
I spoke directly with the pt, currently having a pain at an 8 , left vein burning pain, going on for 3 weeks. No change in vision, , states now feeling pressure in her left eye, as if maybe her peripheral vision is being compromised. \Explained to pt that she should go to the ed for further eval esp as she describes possible vision loss. (temporal arteritis is in my differential). Stated she wanted to try prednisone that I offered to send in , and if things worsened to  Go to the ED. I explained this would not represent a treating dose of medication if she does have temporal arteritis.  She is being referred to neurology soonest available.

## 2013-06-29 NOTE — Assessment & Plan Note (Signed)
Disabling limits ability to get out, no symptom of infection, trial of vesicare

## 2013-06-29 NOTE — Assessment & Plan Note (Signed)
Uncontrolled Hyperlipidemia:Low fat diet discussed and encouraged.  Pt to take medication daily, has inadvertently been on no medication for past several month

## 2013-06-29 NOTE — Assessment & Plan Note (Signed)
Controlled, no change in medication  

## 2013-06-29 NOTE — Assessment & Plan Note (Signed)
Improved with change in diet and weight loss, pt applauded on this and encouraged to continue

## 2013-06-29 NOTE — Assessment & Plan Note (Signed)
Increased and uncontrolled, start celebrex and inc dose of gabapentin Methadone as before

## 2013-06-29 NOTE — Assessment & Plan Note (Signed)
Improved symptoms with chronic compression hose daily

## 2013-07-10 ENCOUNTER — Telehealth: Payer: Self-pay | Admitting: Family Medicine

## 2013-07-10 NOTE — Telephone Encounter (Signed)
Patient is aware 

## 2013-07-15 ENCOUNTER — Other Ambulatory Visit: Payer: Self-pay | Admitting: Family Medicine

## 2013-07-18 ENCOUNTER — Other Ambulatory Visit: Payer: Self-pay

## 2013-07-18 DIAGNOSIS — M549 Dorsalgia, unspecified: Secondary | ICD-10-CM

## 2013-07-18 MED ORDER — METHADONE HCL 10 MG PO TABS: 10.0000 mg | ORAL_TABLET | Freq: Three times a day (TID) | ORAL | Status: DC

## 2013-08-14 ENCOUNTER — Other Ambulatory Visit: Payer: Self-pay | Admitting: Family Medicine

## 2013-08-15 ENCOUNTER — Other Ambulatory Visit: Payer: Self-pay

## 2013-08-15 DIAGNOSIS — M549 Dorsalgia, unspecified: Secondary | ICD-10-CM

## 2013-08-15 MED ORDER — METHADONE HCL 10 MG PO TABS: 10.0000 mg | ORAL_TABLET | Freq: Three times a day (TID) | ORAL | Status: DC

## 2013-08-19 ENCOUNTER — Telehealth: Payer: Self-pay | Admitting: *Deleted

## 2013-08-19 DIAGNOSIS — M549 Dorsalgia, unspecified: Secondary | ICD-10-CM

## 2013-08-19 MED ORDER — ESOMEPRAZOLE MAGNESIUM 40 MG PO CPDR: 40.0000 mg | DELAYED_RELEASE_CAPSULE | Freq: Every day | ORAL | Status: DC

## 2013-08-19 MED ORDER — CELECOXIB 200 MG PO CAPS: 200.0000 mg | ORAL_CAPSULE | Freq: Every day | ORAL | Status: AC

## 2013-08-19 NOTE — Telephone Encounter (Signed)
Pt called stating she is coming in this Friday the 16th to pick up her RX, pt also needs RX for nexium, and celabrex pt needs a 90 day supply pt's medication comes through the mail. Please advise [phone]

## 2013-08-19 NOTE — Telephone Encounter (Signed)
Patient aware and meds printed for her to collect

## 2013-08-25 ENCOUNTER — Other Ambulatory Visit: Payer: Self-pay | Admitting: Family Medicine

## 2013-09-10 ENCOUNTER — Telehealth: Payer: Self-pay | Admitting: Family Medicine

## 2013-09-11 NOTE — Telephone Encounter (Signed)
Lab sent and message left for patient to fast

## 2013-09-12 ENCOUNTER — Other Ambulatory Visit: Payer: Self-pay

## 2013-09-12 DIAGNOSIS — M549 Dorsalgia, unspecified: Secondary | ICD-10-CM

## 2013-09-12 MED ORDER — METHADONE HCL 10 MG PO TABS: 10.0000 mg | ORAL_TABLET | Freq: Three times a day (TID) | ORAL | Status: DC

## 2013-09-24 ENCOUNTER — Ambulatory Visit: Payer: Medicare Other | Admitting: Family Medicine

## 2013-10-07 ENCOUNTER — Encounter (INDEPENDENT_AMBULATORY_CARE_PROVIDER_SITE_OTHER): Payer: Self-pay

## 2013-10-07 ENCOUNTER — Other Ambulatory Visit: Payer: Self-pay

## 2013-10-07 ENCOUNTER — Encounter: Payer: Self-pay | Admitting: Family Medicine

## 2013-10-07 ENCOUNTER — Ambulatory Visit (INDEPENDENT_AMBULATORY_CARE_PROVIDER_SITE_OTHER): Payer: Medicare Other | Admitting: Family Medicine

## 2013-10-07 VITALS — BP 132/82 | HR 66 | Resp 18 | Ht 63.0 in | Wt 203.0 lb

## 2013-10-07 DIAGNOSIS — I10 Essential (primary) hypertension: Secondary | ICD-10-CM | POA: Diagnosis not present

## 2013-10-07 DIAGNOSIS — R7309 Other abnormal glucose: Secondary | ICD-10-CM

## 2013-10-07 DIAGNOSIS — B351 Tinea unguium: Secondary | ICD-10-CM

## 2013-10-07 DIAGNOSIS — E039 Hypothyroidism, unspecified: Secondary | ICD-10-CM | POA: Diagnosis not present

## 2013-10-07 DIAGNOSIS — M5090 Cervical disc disorder, unspecified, unspecified cervical region: Secondary | ICD-10-CM

## 2013-10-07 DIAGNOSIS — E785 Hyperlipidemia, unspecified: Secondary | ICD-10-CM

## 2013-10-07 DIAGNOSIS — M549 Dorsalgia, unspecified: Secondary | ICD-10-CM

## 2013-10-07 DIAGNOSIS — E041 Nontoxic single thyroid nodule: Secondary | ICD-10-CM

## 2013-10-07 DIAGNOSIS — R7302 Impaired glucose tolerance (oral): Secondary | ICD-10-CM

## 2013-10-07 DIAGNOSIS — M949 Disorder of cartilage, unspecified: Secondary | ICD-10-CM

## 2013-10-07 DIAGNOSIS — M899 Disorder of bone, unspecified: Secondary | ICD-10-CM

## 2013-10-07 MED ORDER — GABAPENTIN 300 MG PO CAPS: ORAL_CAPSULE | ORAL | Status: DC

## 2013-10-07 MED ORDER — LOVASTATIN ER 40 MG PO TB24: 40.0000 mg | ORAL_TABLET | Freq: Every day | ORAL | Status: DC

## 2013-10-07 MED ORDER — TRAZODONE HCL 100 MG PO TABS: ORAL_TABLET | ORAL | Status: DC

## 2013-10-07 NOTE — Patient Instructions (Addendum)
Pelvic and breast in 4 month, call if you need me before  Fasting lipid, cmp, HBa1C , TSH, cbc,vit D, pls do in March  You are referred for [place] of thyroid gland in April  Blood pressure is excellent  Terbinafine for fungal toenail infection will be sent as soon as we re check blood (liver)

## 2013-10-10 ENCOUNTER — Other Ambulatory Visit: Payer: Self-pay

## 2013-10-10 DIAGNOSIS — M549 Dorsalgia, unspecified: Secondary | ICD-10-CM

## 2013-10-10 MED ORDER — METHADONE HCL 10 MG PO TABS: 10.0000 mg | ORAL_TABLET | Freq: Three times a day (TID) | ORAL | Status: DC

## 2013-10-16 ENCOUNTER — Ambulatory Visit (HOSPITAL_COMMUNITY): Payer: Medicare Other

## 2013-10-17 DIAGNOSIS — R7309 Other abnormal glucose: Secondary | ICD-10-CM | POA: Diagnosis not present

## 2013-10-17 DIAGNOSIS — I10 Essential (primary) hypertension: Secondary | ICD-10-CM | POA: Diagnosis not present

## 2013-10-17 DIAGNOSIS — M899 Disorder of bone, unspecified: Secondary | ICD-10-CM | POA: Diagnosis not present

## 2013-10-17 DIAGNOSIS — E785 Hyperlipidemia, unspecified: Secondary | ICD-10-CM | POA: Diagnosis not present

## 2013-10-17 DIAGNOSIS — E039 Hypothyroidism, unspecified: Secondary | ICD-10-CM | POA: Diagnosis not present

## 2013-10-17 DIAGNOSIS — M949 Disorder of cartilage, unspecified: Secondary | ICD-10-CM | POA: Diagnosis not present

## 2013-10-17 LAB — LIPID PANEL
Cholesterol: 286 mg/dL — ABNORMAL HIGH (ref 0–200)
HDL: 46 mg/dL (ref >39)
LDL Cholesterol: 178 mg/dL — ABNORMAL HIGH (ref 0–99)
Total CHOL/HDL Ratio: 6.2 Ratio
Triglycerides: 311 mg/dL — ABNORMAL HIGH (ref <150)
VLDL: 62 mg/dL — ABNORMAL HIGH (ref 0–40)

## 2013-10-17 LAB — COMPREHENSIVE METABOLIC PANEL
ALK PHOS: 47 U/L (ref 39–117)
ALT: 10 U/L (ref 0–35)
AST: 18 U/L (ref 0–37)
Albumin: 3.1 g/dL — ABNORMAL LOW (ref 3.5–5.2)
BUN: 26 mg/dL — ABNORMAL HIGH (ref 6–23)
CALCIUM: 8.8 mg/dL (ref 8.4–10.5)
CO2: 28 mEq/L (ref 19–32)
Chloride: 104 mEq/L (ref 96–112)
Creat: 1.63 mg/dL — ABNORMAL HIGH (ref 0.50–1.10)
Glucose, Bld: 107 mg/dL — ABNORMAL HIGH (ref 70–99)
Potassium: 4.6 mEq/L (ref 3.5–5.3)
SODIUM: 138 meq/L (ref 135–145)
TOTAL PROTEIN: 5.8 g/dL — AB (ref 6.0–8.3)
Total Bilirubin: 0.4 mg/dL (ref 0.2–1.2)

## 2013-10-17 LAB — CBC
HEMATOCRIT: 42.6 % (ref 36.0–46.0)
Hemoglobin: 14.2 g/dL (ref 12.0–15.0)
MCH: 30.9 pg (ref 26.0–34.0)
MCHC: 33.3 g/dL (ref 30.0–36.0)
MCV: 92.6 fL (ref 78.0–100.0)
PLATELETS: 245 10*3/uL (ref 150–400)
RBC: 4.6 MIL/uL (ref 3.87–5.11)
RDW: 13.4 % (ref 11.5–15.5)
WBC: 6.3 10*3/uL (ref 4.0–10.5)

## 2013-10-17 LAB — HEMOGLOBIN A1C
Hgb A1c MFr Bld: 5.9 % — ABNORMAL HIGH (ref <5.7)
MEAN PLASMA GLUCOSE: 123 mg/dL — AB (ref <117)

## 2013-10-18 LAB — VITAMIN D 25 HYDROXY (VIT D DEFICIENCY, FRACTURES): Vit D, 25-Hydroxy: 19 ng/mL — ABNORMAL LOW (ref 30–89)

## 2013-10-18 LAB — TSH: TSH: 6.651 u[IU]/mL — ABNORMAL HIGH (ref 0.350–4.500)

## 2013-10-21 ENCOUNTER — Ambulatory Visit (HOSPITAL_COMMUNITY)
Admission: RE | Admit: 2013-10-21 | Discharge: 2013-10-21 | Disposition: A | Discharge status: Home / Self Care | Payer: Medicare Other | Source: Ambulatory Visit | Attending: Family Medicine | Admitting: Family Medicine

## 2013-10-21 ENCOUNTER — Other Ambulatory Visit: Payer: Self-pay | Admitting: Family Medicine

## 2013-10-21 DIAGNOSIS — E041 Nontoxic single thyroid nodule: Secondary | ICD-10-CM | POA: Diagnosis not present

## 2013-10-21 DIAGNOSIS — E039 Hypothyroidism, unspecified: Secondary | ICD-10-CM

## 2013-10-22 ENCOUNTER — Other Ambulatory Visit: Payer: Self-pay | Admitting: Family Medicine

## 2013-10-22 ENCOUNTER — Other Ambulatory Visit: Payer: Self-pay

## 2013-10-22 DIAGNOSIS — E039 Hypothyroidism, unspecified: Secondary | ICD-10-CM

## 2013-10-22 MED ORDER — LEVOTHYROXINE SODIUM 200 MCG PO TABS: ORAL_TABLET | ORAL | Status: DC

## 2013-10-22 MED ORDER — ROSUVASTATIN CALCIUM 40 MG PO TABS: 40.0000 mg | ORAL_TABLET | Freq: Every day | ORAL | Status: DC

## 2013-10-30 ENCOUNTER — Other Ambulatory Visit: Payer: Self-pay | Admitting: Family Medicine

## 2013-10-31 ENCOUNTER — Other Ambulatory Visit: Payer: Self-pay

## 2013-10-31 DIAGNOSIS — M549 Dorsalgia, unspecified: Secondary | ICD-10-CM

## 2013-10-31 MED ORDER — METHADONE HCL 10 MG PO TABS: 10.0000 mg | ORAL_TABLET | Freq: Three times a day (TID) | ORAL | Status: DC

## 2013-11-19 ENCOUNTER — Other Ambulatory Visit: Payer: Self-pay | Admitting: Family Medicine

## 2013-12-04 ENCOUNTER — Other Ambulatory Visit: Payer: Self-pay

## 2013-12-04 ENCOUNTER — Telehealth: Payer: Self-pay | Admitting: Family Medicine

## 2013-12-04 DIAGNOSIS — M549 Dorsalgia, unspecified: Secondary | ICD-10-CM

## 2013-12-04 MED ORDER — METHADONE HCL 10 MG PO TABS: 10.0000 mg | ORAL_TABLET | Freq: Three times a day (TID) | ORAL | Status: DC

## 2013-12-05 ENCOUNTER — Other Ambulatory Visit: Payer: Self-pay

## 2013-12-05 DIAGNOSIS — E785 Hyperlipidemia, unspecified: Secondary | ICD-10-CM

## 2013-12-05 MED ORDER — LOVASTATIN 40 MG PO TABS: 40.0000 mg | ORAL_TABLET | Freq: Every day | ORAL | Status: DC

## 2013-12-05 NOTE — Telephone Encounter (Signed)
pls erx lovastatin 40 mg daily #30 refill 5 , she NEEDS rept lipid s in 3 to 3.5 monthd with hepatic , fi they are not already ordered , as she is chnageing medication to a less strong med. Pls encourage her to continue healthy eating focus with low fat

## 2013-12-05 NOTE — Telephone Encounter (Signed)
Please advise 

## 2013-12-05 NOTE — Telephone Encounter (Signed)
Patient aware, med sent and lab order mailed to her for Aug

## 2013-12-12 DIAGNOSIS — E039 Hypothyroidism, unspecified: Secondary | ICD-10-CM | POA: Diagnosis not present

## 2013-12-12 LAB — TSH: TSH: 0.164 u[IU]/mL — ABNORMAL LOW (ref 0.350–4.500)

## 2013-12-17 ENCOUNTER — Other Ambulatory Visit: Payer: Self-pay

## 2013-12-17 DIAGNOSIS — E039 Hypothyroidism, unspecified: Secondary | ICD-10-CM

## 2013-12-22 ENCOUNTER — Ambulatory Visit: Payer: Medicare Other | Admitting: Family Medicine

## 2013-12-23 ENCOUNTER — Telehealth: Payer: Self-pay | Admitting: Family Medicine

## 2013-12-23 NOTE — Telephone Encounter (Signed)
Mailed a new copy of her labs

## 2014-01-01 ENCOUNTER — Other Ambulatory Visit: Payer: Self-pay

## 2014-01-01 DIAGNOSIS — M549 Dorsalgia, unspecified: Secondary | ICD-10-CM

## 2014-01-01 MED ORDER — METHADONE HCL 10 MG PO TABS: 10.0000 mg | ORAL_TABLET | Freq: Three times a day (TID) | ORAL | Status: DC

## 2014-01-06 ENCOUNTER — Other Ambulatory Visit: Payer: Self-pay | Admitting: Family Medicine

## 2014-01-08 ENCOUNTER — Encounter (INDEPENDENT_AMBULATORY_CARE_PROVIDER_SITE_OTHER): Payer: Self-pay

## 2014-01-08 ENCOUNTER — Other Ambulatory Visit: Payer: Self-pay

## 2014-01-08 ENCOUNTER — Encounter: Payer: Self-pay | Admitting: Family Medicine

## 2014-01-08 ENCOUNTER — Ambulatory Visit (INDEPENDENT_AMBULATORY_CARE_PROVIDER_SITE_OTHER): Payer: Medicare Other | Admitting: Family Medicine

## 2014-01-08 VITALS — BP 154/82 | HR 80 | Resp 18 | Ht 63.0 in | Wt 197.0 lb

## 2014-01-08 DIAGNOSIS — R209 Unspecified disturbances of skin sensation: Secondary | ICD-10-CM | POA: Diagnosis not present

## 2014-01-08 DIAGNOSIS — R7302 Impaired glucose tolerance (oral): Secondary | ICD-10-CM

## 2014-01-08 DIAGNOSIS — R208 Other disturbances of skin sensation: Secondary | ICD-10-CM

## 2014-01-08 DIAGNOSIS — I10 Essential (primary) hypertension: Secondary | ICD-10-CM | POA: Diagnosis not present

## 2014-01-08 DIAGNOSIS — D1739 Benign lipomatous neoplasm of skin and subcutaneous tissue of other sites: Secondary | ICD-10-CM

## 2014-01-08 DIAGNOSIS — M5090 Cervical disc disorder, unspecified, unspecified cervical region: Secondary | ICD-10-CM | POA: Insufficient documentation

## 2014-01-08 DIAGNOSIS — E039 Hypothyroidism, unspecified: Secondary | ICD-10-CM

## 2014-01-08 DIAGNOSIS — E785 Hyperlipidemia, unspecified: Secondary | ICD-10-CM

## 2014-01-08 DIAGNOSIS — I739 Peripheral vascular disease, unspecified: Secondary | ICD-10-CM

## 2014-01-08 DIAGNOSIS — R7309 Other abnormal glucose: Secondary | ICD-10-CM

## 2014-01-08 DIAGNOSIS — D1720 Benign lipomatous neoplasm of skin and subcutaneous tissue of unspecified limb: Secondary | ICD-10-CM

## 2014-01-08 MED ORDER — ROSUVASTATIN CALCIUM 40 MG PO TABS: 40.0000 mg | ORAL_TABLET | Freq: Every day | ORAL | Status: DC

## 2014-01-08 MED ORDER — HYDROCHLOROTHIAZIDE 12.5 MG PO CAPS: 12.5000 mg | ORAL_CAPSULE | Freq: Every day | ORAL | Status: DC

## 2014-01-08 NOTE — Progress Notes (Signed)
Subjective:    Patient ID: Katelyn Rowland, female    DOB: January 13, 1941, 73 y.o.   MRN: [id number]  HPI ssive burning in both palms, and skin feels very sore on the scalp, 2 weeks ago had a 10 plus headache and neck pain , has had c spine surgery in the past C/o swelling unde skin of right forearm States  Can now afford crestor through 90 day supply, will order same   Review of Systems See HPI Denies recent fever or chills. Denies sinus pressure, nasal congestion, ear pain or sore throat. Denies chest congestion, productive cough or wheezing. Denies chest pains, palpitations and reports improved leg swelling with compression hose Denies abdominal pain, nausea, vomiting,diarrhea or constipation.   Denies dysuria, frequency, hesitancy or incontinence. Chronic  joint pain, swelling and limitation in mobility. C/o headaches, and lower extremity , numbness, and  tingling. Denies depression, anxiety has chronic insomnia. Denies skin break down or rash.        Objective:   Physical Exam BP 154/82  Pulse 80  Resp 18  Ht 5\' 3"  (1.6 m)  Wt 197 lb 0.5 oz (89.372 kg)  BMI 34.91 kg/m2  SpO2 98% Patient alert and oriented and in no cardiopulmonary distress.Pt in pain  HEENT: No facial asymmetry, EOMI, no sinus tenderness,  oropharynx pink and moist.  Neck decreased ROM with trapezius spasm, no JVD,  no adenopathy.  Chest: Clear to auscultation bilaterally.  CVS: S1, S2 no murmurs, no S3.  ABD: Soft non tender.   Ext: One plus  edema  MS: decreased ROM spine, shoulders, hips and knees.  Skin: Intact, no ulcerations or rash noted.  Psych: Good eye contact, normal affect. Memory intact not anxious or depressed appearing.  CNS: CN 2-12 intact, power,n normal throughout.        Assessment & Plan:HYPERTENSION Uncontrolled add HCTZ DASH diet and commitment to daily physical activity for a minimum of 30 minutes discussed and encouraged, as a part of hypertension  management. The importance of attaining a healthy weight is also discussed.   Cervical neck pain with evidence of disc disease 5 week h/o increased pain in head and neck with severe burning in hands. Check B12 and needs MI of neck. Inc in gabapentin dose  to address pain issues  UNSPECIFIED HYPOTHYROIDISM Overcorrected has had change in dose and is to have rept test in next several weeks  HYPERLIPIDEMIA Severe and uncontrolled, states she is able to perchase med via 90 dya supply at a more affordable cost, will order  IGT (impaired glucose tolerance) Patient educated about the importance of limiting  Carbohydrate intake , the need to commit to daily physical activity for a minimum of 30 minutes , and to commit weight loss. The fact that changes in all these areas will reduce or eliminate all together the development of diabetes is stressed.   Updated lab needed at/ before next visit.   Lipoma of forearm Noted on right forearm, dia approx 2.5 cm. Pt reassured likley benign, she declined 2nd opinion woth surgeon at this time, will monitor     HYPERTENSION Uncontrolled add HCTZ DASH diet and commitment to daily physical activity for a minimum of 30 minutes discussed and encouraged, as a part of hypertension management. The importance of attaining a healthy weight is also discussed.   Cervical neck pain with evidence of disc disease 5 week h/o increased pain in head and neck with severe burning in hands. Check B12 and needs MI of  neck. Inc in gabapentin dose  to address pain issues  UNSPECIFIED HYPOTHYROIDISM Overcorrected has had change in dose and is to have rept test in next several weeks  HYPERLIPIDEMIA Severe and uncontrolled, states she is able to perchase med via 90 dya supply at a more affordable cost, will order  IGT (impaired glucose tolerance) Patient educated about the importance of limiting  Carbohydrate intake , the need to commit to daily physical activity for a  minimum of 30 minutes , and to commit weight loss. The fact that changes in all these areas will reduce or eliminate all together the development of diabetes is stressed.   Updated lab needed at/ before next visit.   Lipoma of forearm Noted on right forearm, dia approx 2.5 cm. Pt reassured likley benign, she declined 2nd opinion woth surgeon at this time, will monitor

## 2014-01-08 NOTE — Patient Instructions (Addendum)
Pelvic and breast as before, cancel July 2 appt   You are referred for MRI of neck   Increase gabapentin 300mg  to twice daily , I believe that burning pain is from nerve  Irritation, call and let us know if this works  For you so that we can send in new script at higher dose please  Pls get lab requested TSH the next time you come to collect script in early July/  Lab today, chem 7 and B12   New additional med for BP is HCTZ 12.5 mg take one daily every morning

## 2014-01-09 LAB — BASIC METABOLIC PANEL
BUN: 19 mg/dL (ref 6–23)
CO2: 26 meq/L (ref 19–32)
Calcium: 8.6 mg/dL (ref 8.4–10.5)
Chloride: 106 mEq/L (ref 96–112)
Creat: 1.39 mg/dL — ABNORMAL HIGH (ref 0.50–1.10)
GLUCOSE: 97 mg/dL (ref 70–99)
Potassium: 4.9 mEq/L (ref 3.5–5.3)
Sodium: 140 mEq/L (ref 135–145)

## 2014-01-09 LAB — VITAMIN B12: Vitamin B-12: 832 pg/mL (ref 211–911)

## 2014-01-11 ENCOUNTER — Other Ambulatory Visit: Payer: Self-pay | Admitting: Family Medicine

## 2014-01-11 ENCOUNTER — Telehealth: Payer: Self-pay | Admitting: Family Medicine

## 2014-01-11 DIAGNOSIS — B351 Tinea unguium: Secondary | ICD-10-CM | POA: Insufficient documentation

## 2014-01-11 DIAGNOSIS — E041 Nontoxic single thyroid nodule: Secondary | ICD-10-CM | POA: Insufficient documentation

## 2014-01-11 DIAGNOSIS — D1720 Benign lipomatous neoplasm of skin and subcutaneous tissue of unspecified limb: Secondary | ICD-10-CM | POA: Insufficient documentation

## 2014-01-11 NOTE — Assessment & Plan Note (Signed)
Chronic pain management as before Recently had c spine surgery in 2014

## 2014-01-11 NOTE — Assessment & Plan Note (Signed)
Under corrected, dose inc in synthroid with rept lab in 6 to 8 weeks

## 2014-01-11 NOTE — Assessment & Plan Note (Signed)
Noted on right forearm, dia approx 2.5 cm. Pt reassured likley benign, she declined 2nd opinion woth surgeon at this time, will monitor

## 2014-01-11 NOTE — Assessment & Plan Note (Signed)
Controlled, no change in medication DASH diet and commitment to daily physical activity for a minimum of 30 minutes discussed and encouraged, as a part of hypertension management. The importance of attaining a healthy weight is also discussed.  

## 2014-01-11 NOTE — Assessment & Plan Note (Signed)
F/u imaging needed, same ordered

## 2014-01-11 NOTE — Assessment & Plan Note (Signed)
uncontrolled and deteriorated, needs to resume crestor Hyperlipidemia:Low fat diet discussed and encouraged.

## 2014-01-11 NOTE — Assessment & Plan Note (Signed)
Patient educated about the importance of limiting  Carbohydrate intake , the need to commit to daily physical activity for a minimum of 30 minutes , and to commit weight loss. The fact that changes in all these areas will reduce or eliminate all together the development of diabetes is stressed.   Updated lab needed at/ before next visit.  

## 2014-01-11 NOTE — Assessment & Plan Note (Signed)
deteriroated Patient educated about the importance of limiting  Carbohydrate intake , the need to commit to daily physical activity for a minimum of 30 minutes , and to commit weight loss. The fact that changes in all these areas will reduce or eliminate all together the development of diabetes is stressed.

## 2014-01-11 NOTE — Telephone Encounter (Signed)
Pls contact pt, let her know based on recent liver function test being normal, she can take the med for fungal toenail infecdtion that was offeed at previous visit (March). I have entered 12 week supply of terbinafine pls send in after you spk with her if she still wants it , if not , pls remove, and stae reason in note, thanks

## 2014-01-11 NOTE — Assessment & Plan Note (Signed)
Will treat with normal LFT

## 2014-01-11 NOTE — Assessment & Plan Note (Signed)
Uncontrolled add HCTZ DASH diet and commitment to daily physical activity for a minimum of 30 minutes discussed and encouraged, as a part of hypertension management. The importance of attaining a healthy weight is also discussed.

## 2014-01-11 NOTE — Assessment & Plan Note (Signed)
5 week h/o increased pain in head and neck with severe burning in hands. Check B12 and needs MI of neck. Inc in gabapentin dose  to address pain issues

## 2014-01-11 NOTE — Progress Notes (Signed)
   Subjective:    Patient ID: Katelyn Rowland, female    DOB: 03-22-1941, 73 y.o.   MRN: [id number]  HPI The PT is here for follow up and re-evaluation of chronic medical conditions, medication management and review of any available recent lab and radiology data.  Preventive health is updated, specifically  Cancer screening and Immunization.   Questions or concerns regarding consultations or procedures which the PT has had in the interim are  addressed. The PT denies any adverse reactions to current medications since the last visit.  There are no new concerns.  There are no specific complaints       Review of Systems See HPI Denies recent fever or chills. Denies sinus pressure, nasal congestion, ear pain or sore throat. Denies chest congestion, productive cough or wheezing. Denies chest pains, palpitations and leg swelling Denies abdominal pain, nausea, vomiting,diarrhea or constipation.   Denies dysuria, frequency, hesitancy or incontinence. Denies joint pain, swelling and limitation in mobility. Denies headaches, seizures, numbness, or tingling. Denies depression, anxiety or insomnia. Denies skin break down or rash.c/o excessive toenail fungus wants this treated        Objective:   Physical Exam  BP 132/82  Pulse 66  Resp 18  Ht 5\' 3"  (1.6 m)  Wt 203 lb (92.08 kg)  BMI 35.97 kg/m2  SpO2 95% .Patient alert and oriented and in no cardiopulmonary distress.  HEENT: No facial asymmetry, EOMI,   oropharynx pink and moist.  Neck supple no JVD, no mass.  Chest: Clear to auscultation bilaterally.  CVS: S1, S2 no murmurs, no S3.  ABD: Soft non tender.   Ext: No edema  MS: decreased ROM spine, shoulders, hips and knees.  Skin: Intact, no ulcerations or rash noted.Severe onychomycosis  Psych: Good eye contact, normal affect. Memory intact not anxious or depressed appearing.  CNS: CN 2-12 intact, power,  normal throughout.no focal deficits noted.       Assessment  & Plan:  HYPERTENSION Controlled, no change in medication DASH diet and commitment to daily physical activity for a minimum of 30 minutes discussed and encouraged, as a part of hypertension management. The importance of attaining a healthy weight is also discussed.   HYPERLIPIDEMIA uncontrolled and deteriorated, needs to resume crestor Hyperlipidemia:Low fat diet discussed and encouraged.    UNSPECIFIED HYPOTHYROIDISM Under corrected, dose inc in synthroid with rept lab in 6 to 8 weeks  Cervical neck pain with evidence of disc disease Chronic pain management as before Recently had c spine surgery in 2014  IGT (impaired glucose tolerance) deteriroated Patient educated about the importance of limiting  Carbohydrate intake , the need to commit to daily physical activity for a minimum of 30 minutes , and to commit weight loss. The fact that changes in all these areas will reduce or eliminate all together the development of diabetes is stressed.     Onychomycosis Will treat with normal LFT  Right thyroid nodule F/u imaging needed, same ordered

## 2014-01-11 NOTE — Assessment & Plan Note (Signed)
Severe and uncontrolled, states she is able to perchase med via 90 dya supply at a more affordable cost, will order

## 2014-01-11 NOTE — Assessment & Plan Note (Signed)
Overcorrected has had change in dose and is to have rept test in next several weeks

## 2014-01-12 ENCOUNTER — Other Ambulatory Visit: Payer: Self-pay

## 2014-01-12 ENCOUNTER — Telehealth: Payer: Self-pay

## 2014-01-12 DIAGNOSIS — B351 Tinea unguium: Secondary | ICD-10-CM

## 2014-01-12 MED ORDER — SPIRONOLACTONE 25 MG PO TABS: 25.0000 mg | ORAL_TABLET | Freq: Every day | ORAL | Status: DC

## 2014-01-12 MED ORDER — PREDNISONE 5 MG PO KIT: PACK | ORAL | Status: DC

## 2014-01-12 MED ORDER — TERBINAFINE HCL 250 MG PO TABS: 250.0000 mg | ORAL_TABLET | Freq: Every day | ORAL | Status: DC

## 2014-01-12 NOTE — Telephone Encounter (Signed)
Patient aware and med sent to pharmacy.  

## 2014-01-12 NOTE — Telephone Encounter (Signed)
Verbal order given for Prednisone dose pk 5mg  as directed.  D/c HCTZ.  Patient offered injection in office for allergic reaction.  Sulfa added to allergy list.  Will change to Spironolactone 25

## 2014-01-12 NOTE — Telephone Encounter (Signed)
Patient aware and meds sent 

## 2014-01-14 ENCOUNTER — Other Ambulatory Visit: Payer: Self-pay | Admitting: Family Medicine

## 2014-01-15 ENCOUNTER — Other Ambulatory Visit: Payer: Self-pay

## 2014-01-15 ENCOUNTER — Telehealth: Payer: Self-pay | Admitting: *Deleted

## 2014-01-15 DIAGNOSIS — M549 Dorsalgia, unspecified: Secondary | ICD-10-CM

## 2014-01-15 MED ORDER — GABAPENTIN 300 MG PO CAPS: 300.0000 mg | ORAL_CAPSULE | Freq: Two times a day (BID) | ORAL | Status: DC

## 2014-01-15 MED ORDER — GABAPENTIN 300 MG PO CAPS: ORAL_CAPSULE | ORAL | Status: DC

## 2014-01-15 NOTE — Telephone Encounter (Signed)
Med refilled.

## 2014-01-15 NOTE — Telephone Encounter (Signed)
Pt called stating the gabapentin is working fine for her and she needs a refill on it, pt said she uses [facility]. And if it gets called in today she will have it tomorrow. Please advise!

## 2014-01-16 ENCOUNTER — Ambulatory Visit (HOSPITAL_COMMUNITY): Admission: RE | Admit: 2014-01-16 | Payer: Medicare Other | Source: Ambulatory Visit

## 2014-01-21 ENCOUNTER — Telehealth: Payer: Self-pay

## 2014-01-22 ENCOUNTER — Ambulatory Visit (HOSPITAL_COMMUNITY): Admission: RE | Admit: 2014-01-22 | Payer: Medicare Other | Source: Ambulatory Visit

## 2014-01-22 NOTE — Telephone Encounter (Signed)
Patient states that she has soreness in mouth and throat.  Recently had change from hctz to spirolactolone.  She also recently started terbinafine.  No sinus drainage, no white patches noted in mouth.  Please advise.  She was thinking maybe some magic mouthwash.

## 2014-01-23 MED ORDER — FIRST-DUKES MOUTHWASH MT SUSP: 10.0000 mL | Freq: Three times a day (TID) | OROMUCOSAL | Status: DC

## 2014-01-23 NOTE — Telephone Encounter (Signed)
Verbal order given for Dukes Mouthwash 10cc tid x 7 days.   Patient aware.  Med sent to pharmacy with request to be delivered today.

## 2014-01-23 NOTE — Telephone Encounter (Signed)
Noted , verbal order carried out as stated is correct

## 2014-01-28 ENCOUNTER — Ambulatory Visit (HOSPITAL_COMMUNITY)
Admission: RE | Admit: 2014-01-28 | Discharge: 2014-01-28 | Disposition: A | Discharge status: Home / Self Care | Payer: Medicare Other | Source: Ambulatory Visit | Attending: Family Medicine | Admitting: Family Medicine

## 2014-01-28 ENCOUNTER — Telehealth: Payer: Self-pay

## 2014-01-28 DIAGNOSIS — G510 Bell's palsy: Secondary | ICD-10-CM | POA: Diagnosis not present

## 2014-01-28 DIAGNOSIS — M542 Cervicalgia: Secondary | ICD-10-CM | POA: Diagnosis not present

## 2014-01-28 DIAGNOSIS — M79609 Pain in unspecified limb: Secondary | ICD-10-CM | POA: Insufficient documentation

## 2014-01-28 DIAGNOSIS — Z981 Arthrodesis status: Secondary | ICD-10-CM | POA: Insufficient documentation

## 2014-01-28 DIAGNOSIS — R209 Unspecified disturbances of skin sensation: Secondary | ICD-10-CM | POA: Diagnosis not present

## 2014-01-28 DIAGNOSIS — M5090 Cervical disc disorder, unspecified, unspecified cervical region: Secondary | ICD-10-CM | POA: Diagnosis not present

## 2014-01-28 DIAGNOSIS — M5020 Other cervical disc displacement, unspecified cervical region: Secondary | ICD-10-CM | POA: Diagnosis not present

## 2014-01-28 MED ORDER — GADOBENATE DIMEGLUMINE 529 MG/ML IV SOLN
9.0000 mL | Freq: Once | INTRAVENOUS | Status: AC | PRN
  Administered 2014-01-28: 9 mL via INTRAVENOUS

## 2014-01-28 NOTE — Telephone Encounter (Signed)
Patient reports for the past 3 days she has noticed a drooping on the left side of her face. No speech impairment or any numbness or tingling in any body part. States she feels fine but I told her that I would feel better if she would go be evaluated at urgent care since the drooping is very noticeable. Patient agrees

## 2014-01-30 ENCOUNTER — Other Ambulatory Visit: Payer: Self-pay

## 2014-01-30 DIAGNOSIS — M5489 Other dorsalgia: Secondary | ICD-10-CM

## 2014-01-30 MED ORDER — METHADONE HCL 10 MG PO TABS: 10.0000 mg | ORAL_TABLET | Freq: Three times a day (TID) | ORAL | Status: DC

## 2014-02-03 ENCOUNTER — Telehealth: Payer: Self-pay | Admitting: Family Medicine

## 2014-02-03 DIAGNOSIS — G510 Bell's palsy: Secondary | ICD-10-CM

## 2014-02-03 NOTE — Telephone Encounter (Signed)
Patient called and wanted to let you know that she went to Urgent Care for drooping on her left side and wants you to call her back so she can fill you in on what they told her. Also, she states she was given a prescription there but that it had to be ok with Dr. Moshe Cipro before she was able to take it. Patient phone is [phone].

## 2014-02-05 ENCOUNTER — Ambulatory Visit: Payer: Medicare Other | Admitting: Family Medicine

## 2014-02-05 DIAGNOSIS — E039 Hypothyroidism, unspecified: Secondary | ICD-10-CM | POA: Diagnosis not present

## 2014-02-05 LAB — TSH: TSH: 0.631 u[IU]/mL (ref 0.350–4.500)

## 2014-02-05 MED ORDER — PREDNISONE (PAK) 5 MG PO TABS: 5.0000 mg | ORAL_TABLET | ORAL | Status: DC

## 2014-02-05 NOTE — Telephone Encounter (Signed)
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 I advised her this was only her narcotic. Can we prescribe something again for her take for this?

## 2014-02-05 NOTE — Telephone Encounter (Signed)
I left a msg that med has been sent in, i sent a pred dose pack, she wa told to call back with questions

## 2014-02-12 ENCOUNTER — Encounter: Payer: Self-pay | Admitting: Family Medicine

## 2014-02-12 ENCOUNTER — Ambulatory Visit (INDEPENDENT_AMBULATORY_CARE_PROVIDER_SITE_OTHER): Payer: Medicare Other | Admitting: Family Medicine

## 2014-02-12 ENCOUNTER — Other Ambulatory Visit: Payer: Self-pay | Admitting: Family Medicine

## 2014-02-12 ENCOUNTER — Encounter (INDEPENDENT_AMBULATORY_CARE_PROVIDER_SITE_OTHER): Payer: Self-pay

## 2014-02-12 ENCOUNTER — Other Ambulatory Visit (HOSPITAL_COMMUNITY)
Admission: RE | Admit: 2014-02-12 | Discharge: 2014-02-12 | Disposition: A | Discharge status: Home / Self Care | Payer: Medicare Other | Source: Ambulatory Visit | Attending: Family Medicine | Admitting: Family Medicine

## 2014-02-12 VITALS — BP 150/82 | HR 89 | Resp 16 | Ht 63.0 in | Wt 198.0 lb

## 2014-02-12 DIAGNOSIS — I10 Essential (primary) hypertension: Secondary | ICD-10-CM

## 2014-02-12 DIAGNOSIS — Z124 Encounter for screening for malignant neoplasm of cervix: Secondary | ICD-10-CM | POA: Diagnosis not present

## 2014-02-12 DIAGNOSIS — E039 Hypothyroidism, unspecified: Secondary | ICD-10-CM

## 2014-02-12 DIAGNOSIS — Z1211 Encounter for screening for malignant neoplasm of colon: Secondary | ICD-10-CM

## 2014-02-12 DIAGNOSIS — Z0000 Encounter for general adult medical examination without abnormal findings: Secondary | ICD-10-CM

## 2014-02-12 DIAGNOSIS — R5381 Other malaise: Secondary | ICD-10-CM

## 2014-02-12 DIAGNOSIS — E785 Hyperlipidemia, unspecified: Secondary | ICD-10-CM

## 2014-02-12 DIAGNOSIS — R7301 Impaired fasting glucose: Secondary | ICD-10-CM

## 2014-02-12 DIAGNOSIS — R5383 Other fatigue: Secondary | ICD-10-CM

## 2014-02-12 DIAGNOSIS — E559 Vitamin D deficiency, unspecified: Secondary | ICD-10-CM

## 2014-02-12 LAB — HEMOCCULT GUIAC POC 1CARD (OFFICE): Fecal Occult Blood, POC: NEGATIVE

## 2014-02-12 MED ORDER — ERGOCALCIFEROL 1.25 MG (50000 UT) PO CAPS: 50000.0000 [IU] | ORAL_CAPSULE | ORAL | Status: DC

## 2014-02-12 MED ORDER — ROSUVASTATIN CALCIUM 40 MG PO TABS: 40.0000 mg | ORAL_TABLET | Freq: Every day | ORAL | Status: DC

## 2014-02-12 NOTE — Patient Instructions (Addendum)
F/u in early to mid October, please call if you need me before  Continue current dose of thyroid medication   You will get a script for crestor to mail out as requested   Fasting lipid, cmp HBA1C, and TSH in October , before follow up  Try taking TWO gabapentin capsules at bedtime to help with the tingling in your hands please  You need to take vit D supplements once weekly , this is sent to [place], call if too expensive

## 2014-02-12 NOTE — Progress Notes (Signed)
   Subjective:    Patient ID: Katelyn Rowland, female    DOB: [birth year], 73 y.o.   MRN: [id number]  HPI Patient is in for annual physical exam. No other health concerns are expressed or addressed at the visit.    Review of Systems See HPI     Objective:   Physical Exam Pleasant well nourished female, alert and oriented x 3, in no cardio-pulmonary distress. Afebrile. HEENT No facial trauma or asymetry. Sinuses non tender.  EOMI, PERTL, fundoscopic exam  no hemorhage or exudate.  External ears normal, tympanic membranes clear. Oropharynx no erythema or exudate, upper and lowe plates. decreased though adequate ROM, no adenopathy,JVD or thyromegaly.No bruits.  Chest: Clear to ascultation bilaterally.No crackles or wheezes. Non tender to palpation  Breast: No asymetry,no masses or lumps. No tenderness. No nipple discharge or inversion. No axillary or supraclavicular adenopathy  Cardiovascular system; Heart sounds normal,  S1 and  S2 ,no S3.  No murmur, or thrill. Apical beat not displaced Peripheral pulses normal.  Abdomen: Soft, non tender, no organomegaly or masses. No bruits. Bowel sounds normal. No guarding, tenderness or rebound.  Rectal:  Normal sphincter tone. No mass.No rectal masses.  Guaiac negative stool.  GU: External genitalia normal female genitalia , female distribution of hair. No lesions. Urethral meatus normal in size, no  Prolapse, no lesions visibly  Present. Bladder non tender. Vagina pink and moist , with no visible lesions , physiologic  discharge present . Adequate pelvic support no  cystocele or rectocele noted Uterus absent , no adnexal masses, no  adnexal tenderness.   Musculoskeletal exam: Decreased  ROM of spine, hips , shoulders and knees. . No muscle wasting or atrophy.   Neurologic: Cranial nerves 2 to 12 intact. Power, tone ,sensation and reflexes normal throughout. . No tremor.  Skin: Intact, no ulceration, erythema ,  scaling or rash noted. Pigmentation normal throughout  Psych; Normal mood and affect. Judgement and concentration normal        Assessment & Plan:  Encounter for annual physical exam Annual exam as documented. Counseling done  re healthy lifestyle involving commitment to daily exercise  As able,and a heart healthy diet, .The importance of adequate sleep also discussed. Regular seat belt use  is also discussed.  Immunization and cancer screening needs are specifically addressed at this visit.Pt declines colonoscopy or mammogram and also prevnar vaccine at this visit

## 2014-02-13 ENCOUNTER — Telehealth: Payer: Self-pay | Admitting: *Deleted

## 2014-02-13 ENCOUNTER — Other Ambulatory Visit: Payer: Self-pay

## 2014-02-13 MED ORDER — FIRST-DUKES MOUTHWASH MT SUSP: 10.0000 mL | Freq: Three times a day (TID) | OROMUCOSAL | Status: DC

## 2014-02-13 NOTE — Telephone Encounter (Signed)
pls refill med requested x 1

## 2014-02-13 NOTE — Telephone Encounter (Signed)
Pt called wanting to get some magic mouth wash called into [facility]. If it is called in before 3pm today they will deliver it to her, pt said her tongue and mouth is really sore.

## 2014-02-13 NOTE — Telephone Encounter (Signed)
Med sent with message to deliver

## 2014-02-14 ENCOUNTER — Telehealth: Payer: Self-pay | Admitting: Family Medicine

## 2014-02-14 DIAGNOSIS — G8929 Other chronic pain: Secondary | ICD-10-CM | POA: Insufficient documentation

## 2014-02-14 NOTE — Assessment & Plan Note (Signed)
Annual exam as documented. Counseling done  re healthy lifestyle involving commitment to daily exercise  As able,and a heart healthy diet, .The importance of adequate sleep also discussed. Regular seat belt use  is also discussed.  Immunization and cancer screening needs are specifically addressed at this visit.Pt declines colonoscopy or mammogram and also prevnar vaccine at this visit

## 2014-02-14 NOTE — Telephone Encounter (Signed)
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 Let her know I strongly encoiurage this as vision is vERY important. Pls refer for eye exam for  Reduced vision if she agrees to "provider who takes he rins" if she has no specific Doc, thanks

## 2014-02-16 LAB — CYTOLOGY - PAP

## 2014-02-16 NOTE — Telephone Encounter (Signed)
Patient does not want eye exam at this time. She doesn't drive and doesn't want to have glasses. Will call bacj if she changes her mind

## 2014-02-25 ENCOUNTER — Telehealth: Payer: Self-pay

## 2014-02-25 DIAGNOSIS — H547 Unspecified visual loss: Secondary | ICD-10-CM

## 2014-02-25 NOTE — Telephone Encounter (Signed)
Patient changed her mind and was referred for an eye exam

## 2014-02-26 ENCOUNTER — Other Ambulatory Visit: Payer: Self-pay

## 2014-02-26 DIAGNOSIS — M5489 Other dorsalgia: Secondary | ICD-10-CM

## 2014-02-26 MED ORDER — METHADONE HCL 10 MG PO TABS: 10.0000 mg | ORAL_TABLET | Freq: Three times a day (TID) | ORAL | Status: DC

## 2014-03-04 ENCOUNTER — Telehealth: Payer: Self-pay

## 2014-03-04 NOTE — Telephone Encounter (Signed)
Noted  

## 2014-03-09 ENCOUNTER — Telehealth: Payer: Self-pay | Admitting: Family Medicine

## 2014-03-09 DIAGNOSIS — M5090 Cervical disc disorder, unspecified, unspecified cervical region: Secondary | ICD-10-CM

## 2014-03-09 NOTE — Telephone Encounter (Signed)
Noted, pls enter a referral , iI will sign, though she's been there she may need this for ins coverage

## 2014-03-09 NOTE — Telephone Encounter (Signed)
Patient wants to let Dr Moshe Cipro know that she is going to call Dr Joya Salm office to get an appointment for her neck for her severe headaches and bulging disks

## 2014-03-10 NOTE — Telephone Encounter (Signed)
Referral entered  

## 2014-03-10 NOTE — Addendum Note (Signed)
Addended by: Denman George B on: 03/10/2014 11:37 AM   Modules accepted: Orders

## 2014-03-12 ENCOUNTER — Other Ambulatory Visit: Payer: Self-pay | Admitting: Family Medicine

## 2014-03-17 ENCOUNTER — Telehealth: Payer: Self-pay

## 2014-03-17 DIAGNOSIS — R51 Headache: Principal | ICD-10-CM

## 2014-03-17 DIAGNOSIS — R519 Headache, unspecified: Secondary | ICD-10-CM

## 2014-03-19 NOTE — Telephone Encounter (Signed)
Patient aware and would like to be referred locally for neurology.     Referral entered.

## 2014-03-19 NOTE — Addendum Note (Signed)
Addended by: Denman George B on: 03/19/2014 12:51 PM   Modules accepted: Orders

## 2014-03-19 NOTE — Telephone Encounter (Signed)
Patient states that her headaches are left sided.  Is taking all meds.  No dizziness or nausea.  Headache starts after arising.  Please advise.

## 2014-03-19 NOTE — Telephone Encounter (Signed)
Needs to go to ED today for evaluation of new disabling headachesd, then have an appointment to be f/uby neurologist for heacdache eval and manaegem,enrt . Plsrefer to neurologist of her choice I will sign

## 2014-03-26 ENCOUNTER — Other Ambulatory Visit: Payer: Self-pay | Admitting: Family Medicine

## 2014-03-26 ENCOUNTER — Other Ambulatory Visit: Payer: Self-pay

## 2014-03-26 DIAGNOSIS — M5489 Other dorsalgia: Secondary | ICD-10-CM

## 2014-03-26 MED ORDER — METHADONE HCL 10 MG PO TABS: 10.0000 mg | ORAL_TABLET | Freq: Three times a day (TID) | ORAL | Status: DC

## 2014-03-30 DIAGNOSIS — M5137 Other intervertebral disc degeneration, lumbosacral region: Secondary | ICD-10-CM | POA: Diagnosis not present

## 2014-03-30 DIAGNOSIS — Z6835 Body mass index (BMI) 35.0-35.9, adult: Secondary | ICD-10-CM | POA: Diagnosis not present

## 2014-03-30 DIAGNOSIS — I10 Essential (primary) hypertension: Secondary | ICD-10-CM | POA: Diagnosis not present

## 2014-04-03 ENCOUNTER — Other Ambulatory Visit: Payer: Self-pay | Admitting: Family Medicine

## 2014-04-10 ENCOUNTER — Other Ambulatory Visit: Payer: Self-pay | Admitting: Family Medicine

## 2014-04-13 ENCOUNTER — Other Ambulatory Visit: Payer: Self-pay | Admitting: Family Medicine

## 2014-04-22 ENCOUNTER — Other Ambulatory Visit: Payer: Self-pay

## 2014-04-22 DIAGNOSIS — M5489 Other dorsalgia: Secondary | ICD-10-CM

## 2014-04-22 MED ORDER — METHADONE HCL 10 MG PO TABS: 10.0000 mg | ORAL_TABLET | Freq: Three times a day (TID) | ORAL | Status: DC

## 2014-05-12 ENCOUNTER — Other Ambulatory Visit: Payer: Self-pay | Admitting: Family Medicine

## 2014-05-14 ENCOUNTER — Telehealth: Payer: Self-pay

## 2014-05-14 MED ORDER — PREDNISONE (PAK) 5 MG PO TABS: 5.0000 mg | ORAL_TABLET | ORAL | Status: DC

## 2014-05-14 NOTE — Telephone Encounter (Signed)
Was referred to [facility] for bad headaches at last visit but they have gotten much worse and her appt with neurology isn't until Nov 17th. The headaches start in the am and last all day. Rates pain at a 9. Left side of her neck, across left side of face into the left side of her head. Wants something called in for it. Can hardly stand them. Please advise

## 2014-05-14 NOTE — Telephone Encounter (Signed)
Patient aware.  Will follow through with call in am to Dr. Merlene Laughter

## 2014-05-14 NOTE — Telephone Encounter (Signed)
pred dose pack has been sent in this hopefully will help with the headache, pls also follow through with direc contact with Dr Alessandra Grout office see if she can be seen sooner, talk to referral  Staff about that pls let pt knwo working on that also

## 2014-05-18 ENCOUNTER — Other Ambulatory Visit: Payer: Self-pay | Admitting: Family Medicine

## 2014-05-20 NOTE — Telephone Encounter (Signed)
Will stay with Dr Merlene Laughter because of transportation reasons

## 2014-05-22 ENCOUNTER — Other Ambulatory Visit: Payer: Self-pay

## 2014-05-22 DIAGNOSIS — M5489 Other dorsalgia: Secondary | ICD-10-CM

## 2014-05-22 MED ORDER — METHADONE HCL 10 MG PO TABS: 10.0000 mg | ORAL_TABLET | Freq: Three times a day (TID) | ORAL | Status: DC

## 2014-05-27 IMAGING — US US RENAL
1 series · 14 of 21 positions shown · non-contrast
Comparison: None.

CLINICAL DATA: Proteinuria

RENAL/URINARY TRACT ULTRASOUND COMPLETE

[Series 1: us renal · 0.23mm/px · 14 of 21 slices shown]
[im 1/21]
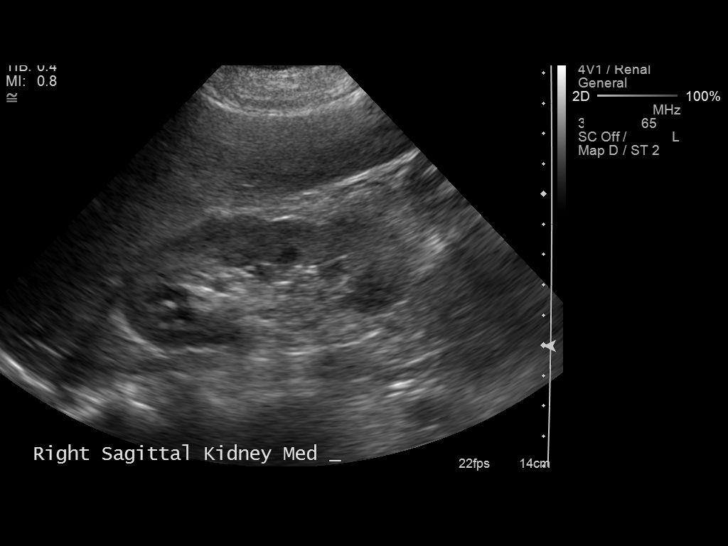
[im 3/21]
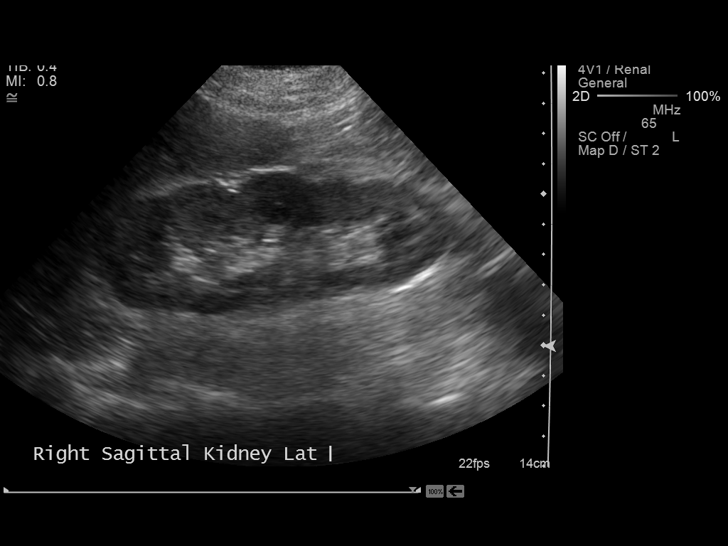
[im 4/21]
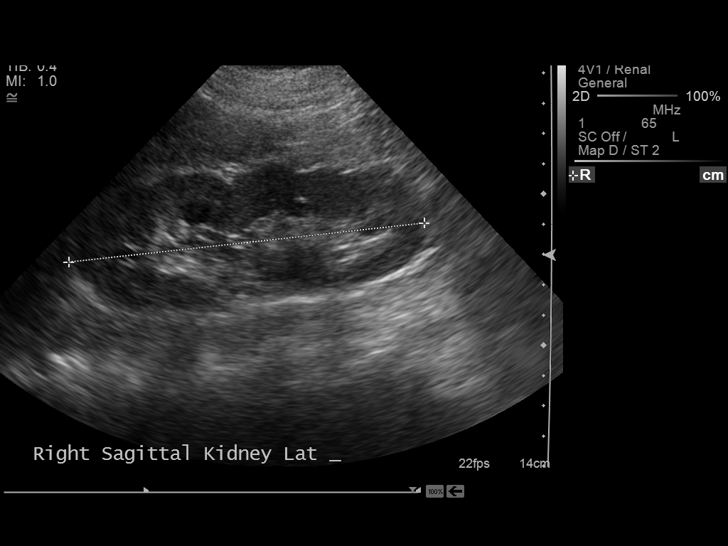
[im 6/21]
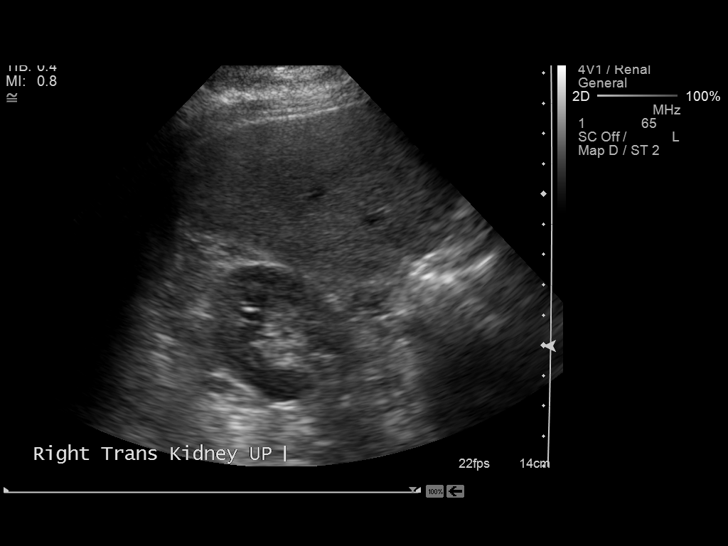
[im 7/21]
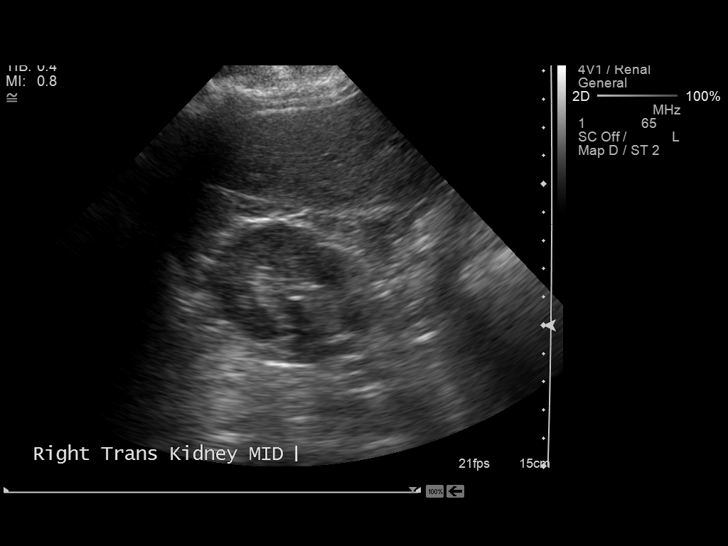
[im 9/21]
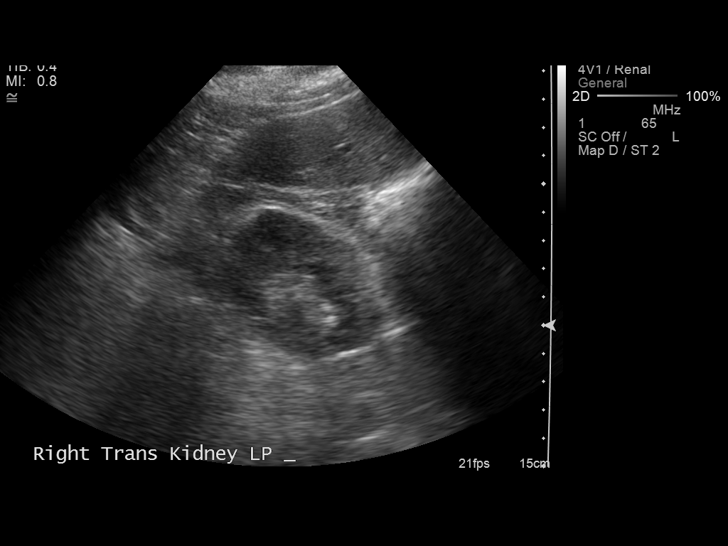
[im 10/21]
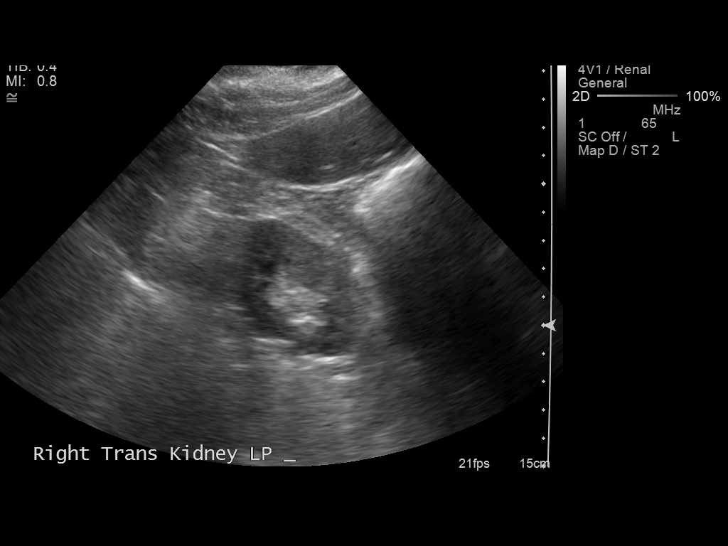
[im 12/21]
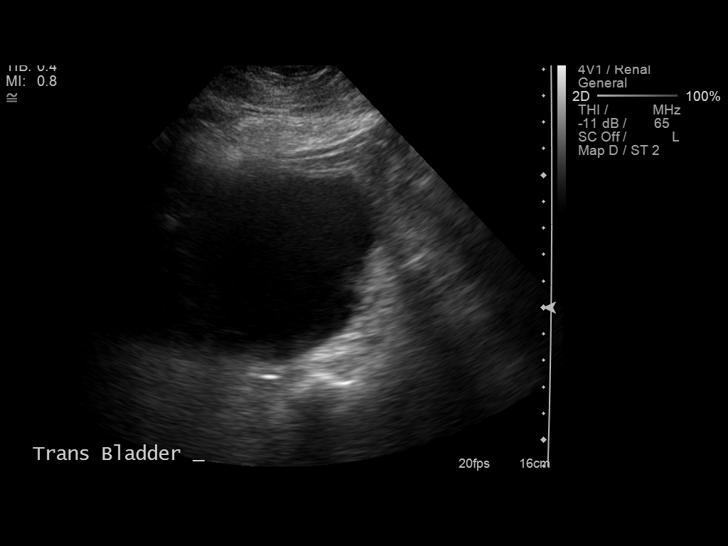
[im 13/21]
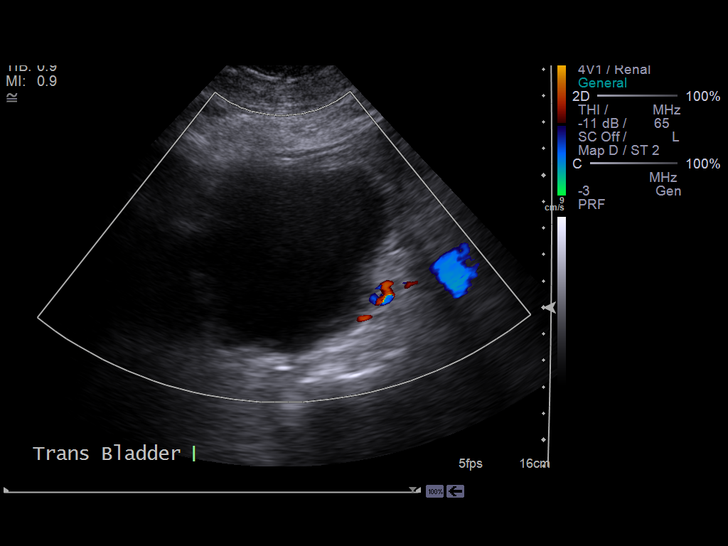
[im 15/21]
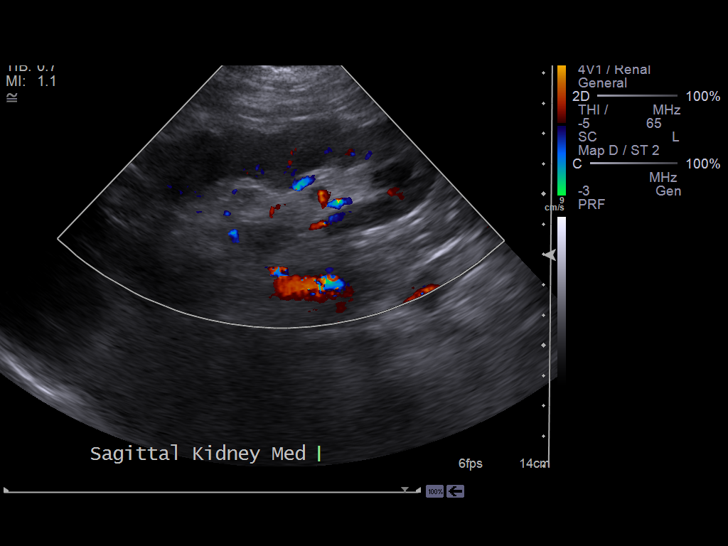
[im 16/21]
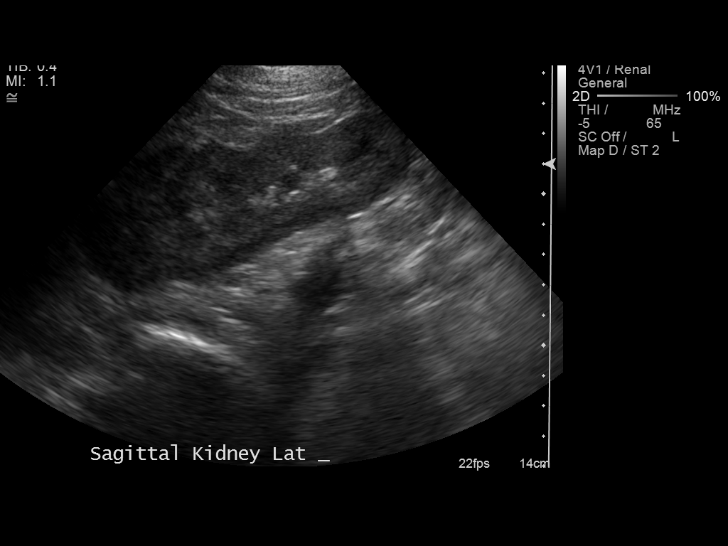
[im 18/21]
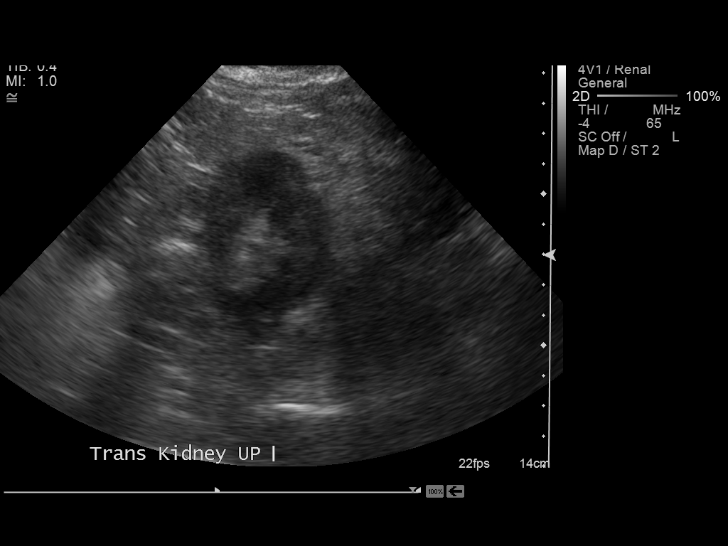
[im 19/21]
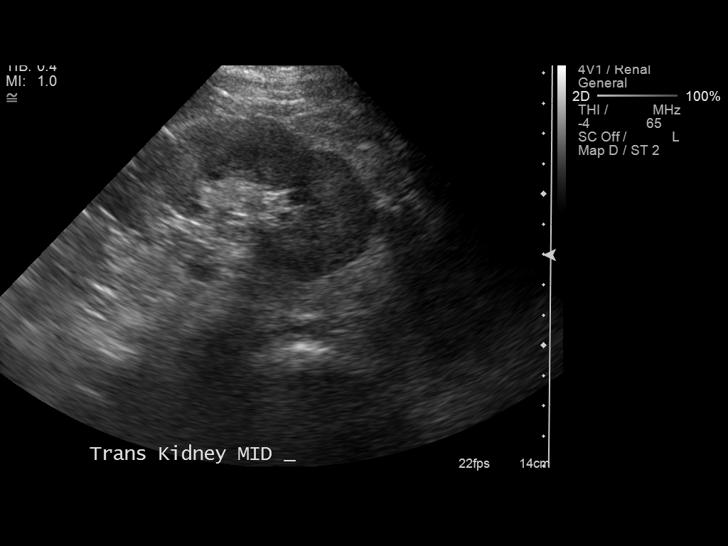
[im 21/21]
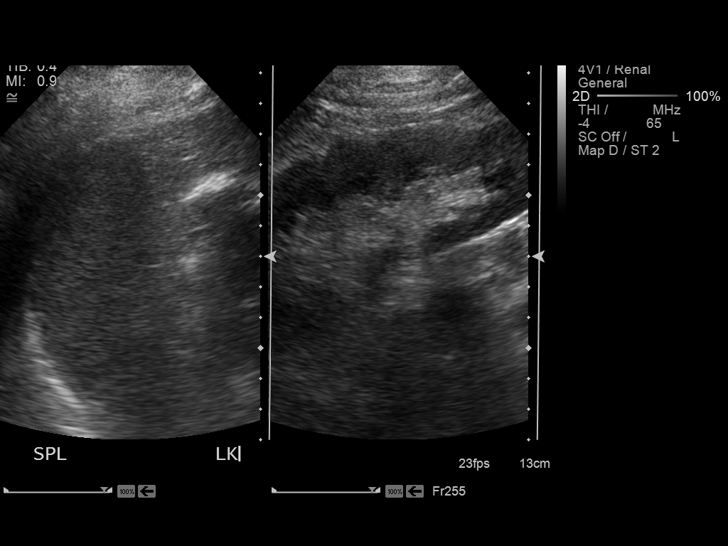

[14 of 21 positions shown; findings below may reference images not displayed]

FINDINGS: Right Kidney:  11.8 cm.  Negative for obstruction or mass.  Normal
appearing renal cortex.

Left Kidney:  11.6 cm.  Negative for obstruction or mass.  Normal
renal cortex

Bladder:  Negative
IMPRESSION: Negative

## 2014-06-05 DIAGNOSIS — E785 Hyperlipidemia, unspecified: Secondary | ICD-10-CM | POA: Diagnosis not present

## 2014-06-05 DIAGNOSIS — I10 Essential (primary) hypertension: Secondary | ICD-10-CM | POA: Diagnosis not present

## 2014-06-05 DIAGNOSIS — R7301 Impaired fasting glucose: Secondary | ICD-10-CM | POA: Diagnosis not present

## 2014-06-06 LAB — COMPREHENSIVE METABOLIC PANEL
ALT: 11 U/L (ref 0–35)
AST: 16 U/L (ref 0–37)
Albumin: 3.6 g/dL (ref 3.5–5.2)
Alkaline Phosphatase: 50 U/L (ref 39–117)
BILIRUBIN TOTAL: 0.3 mg/dL (ref 0.2–1.2)
BUN: 29 mg/dL — ABNORMAL HIGH (ref 6–23)
CHLORIDE: 105 meq/L (ref 96–112)
CO2: 24 mEq/L (ref 19–32)
Calcium: 8.7 mg/dL (ref 8.4–10.5)
Creat: 1.87 mg/dL — ABNORMAL HIGH (ref 0.50–1.10)
Glucose, Bld: 97 mg/dL (ref 70–99)
Potassium: 4.4 mEq/L (ref 3.5–5.3)
SODIUM: 140 meq/L (ref 135–145)
TOTAL PROTEIN: 6.1 g/dL (ref 6.0–8.3)

## 2014-06-06 LAB — LIPID PANEL
CHOLESTEROL: 119 mg/dL (ref 0–200)
HDL: 38 mg/dL — ABNORMAL LOW (ref >39)
LDL Cholesterol: 53 mg/dL (ref 0–99)
Total CHOL/HDL Ratio: 3.1 Ratio
Triglycerides: 138 mg/dL (ref <150)
VLDL: 28 mg/dL (ref 0–40)

## 2014-06-06 LAB — HEMOGLOBIN A1C
Hgb A1c MFr Bld: 6.3 % — ABNORMAL HIGH (ref <5.7)
MEAN PLASMA GLUCOSE: 134 mg/dL — AB (ref <117)

## 2014-06-06 LAB — TSH: TSH: 0.514 u[IU]/mL (ref 0.350–4.500)

## 2014-06-11 ENCOUNTER — Encounter: Payer: Self-pay | Admitting: Family Medicine

## 2014-06-11 ENCOUNTER — Telehealth: Payer: Self-pay | Admitting: Family Medicine

## 2014-06-11 ENCOUNTER — Ambulatory Visit (INDEPENDENT_AMBULATORY_CARE_PROVIDER_SITE_OTHER): Payer: Medicare Other | Admitting: Family Medicine

## 2014-06-11 VITALS — BP 132/80 | HR 85 | Resp 16 | Ht 63.0 in | Wt 197.0 lb

## 2014-06-11 DIAGNOSIS — E038 Other specified hypothyroidism: Secondary | ICD-10-CM | POA: Diagnosis not present

## 2014-06-11 DIAGNOSIS — K219 Gastro-esophageal reflux disease without esophagitis: Secondary | ICD-10-CM | POA: Diagnosis not present

## 2014-06-11 DIAGNOSIS — IMO0001 Reserved for inherently not codable concepts without codable children: Secondary | ICD-10-CM

## 2014-06-11 DIAGNOSIS — I10 Essential (primary) hypertension: Secondary | ICD-10-CM

## 2014-06-11 DIAGNOSIS — E785 Hyperlipidemia, unspecified: Secondary | ICD-10-CM

## 2014-06-11 DIAGNOSIS — M159 Polyosteoarthritis, unspecified: Secondary | ICD-10-CM

## 2014-06-11 DIAGNOSIS — R7302 Impaired glucose tolerance (oral): Secondary | ICD-10-CM | POA: Diagnosis not present

## 2014-06-11 DIAGNOSIS — N289 Disorder of kidney and ureter, unspecified: Secondary | ICD-10-CM

## 2014-06-11 DIAGNOSIS — K222 Esophageal obstruction: Secondary | ICD-10-CM | POA: Diagnosis not present

## 2014-06-11 NOTE — Assessment & Plan Note (Signed)
Worsening reflux symptoms with solid dysphagia x 4 month, on nexium , needs GI eval, states sometimes when she eats food goes up in her nostrils

## 2014-06-11 NOTE — Telephone Encounter (Signed)
Patient is aware 

## 2014-06-11 NOTE — Progress Notes (Signed)
Subjective:    Patient ID: Katelyn Rowland, female    DOB: 1940-10-05, 73 y.o.   MRN: [id number]  HPI  The PT is here for follow up and re-evaluation of chronic medical conditions, medication management and review of any available recent lab and radiology data.  Preventive health is updated, specifically  Cancer screening and Immunization.  Refuses screening and flu vaccine The PT denies any adverse reactions to current medications since the last visit.  C/o increased and uncontrolled dysphagia in the past 4 months, also c/o difficulty swallowing solids. She chokes on them at times  Still refusing colonoscopy, hopefully GI will be  Able to have her change her mind C/o headaches, not as severe following prednisone dose, wants to reschedule neurology appt as time given is a morning appt and states she is absolutely unable to make it to a morning appt Spine and lower extremity pain is unchanged and control is appropriate currently     Review of Systems See HPI Denies recent fever or chills. Denies sinus pressure, nasal congestion, ear pain or sore throat. Denies chest congestion, productive cough or wheezing. Denies chest pains, palpitations and leg swelling  Denies dysuria, frequency, hesitancy or incontinence.  Denies depression,uncontrolled  anxiety or insomnia. Denies skin break down or rash.        Objective:   Physical Exam BP 132/80 mmHg  Pulse 85  Resp 16  Ht 5\' 3"  (1.6 m)  Wt 197 lb (89.359 kg)  BMI 34.91 kg/m2  SpO2 93% Patient alert and oriented and in no cardiopulmonary distress.  HEENT: No facial asymmetry, EOMI,   oropharynx pink and moist.  Neck adeqaute ROM no JVD, no mass.  Chest: Clear to auscultation bilaterally.  CVS: S1, S2 no murmurs, no S3.Regular rate.  ABD: Soft  Mild epigastric tenderness, no guarding or rebound  Ext: No edema  MS:  reduced  ROM spine, shoulders, hips and knees.  Skin: Intact, no ulcerations or rash noted.  Psych: Good  eye contact, normal affect. Memory intact not anxious or depressed appearing.  CNS: CN 2-12 intact, power,  normal throughout.no focal deficits noted.        Assessment & Plan:  GERD with stricture Worsening reflux symptoms with solid dysphagia x 4 month, on nexium , needs GI eval, states sometimes when she eats food goes up in her nostrils  Essential hypertension Controlled, no change in medication DASH diet and commitment to daily physical activity for a minimum of 30 minutes discussed and encouraged, as a part of hypertension management. The importance of attaining a healthy weight is also discussed.   Hypothyroidism Controlled, no change in medication   GEN OSTEOARTHROSIS INVOLVING MULTIPLE SITES Chronic pain management as before, no med change Safety re fall prevention also discussed  Hyperlipidemia LDL goal <100 Marked improvemnt, no med change Hyperlipidemia:Low fat diet discussed and encouraged.  Encouraged to increase moblity  Obesity, Class II, BMI 35-39.9, with comorbidity Unchanged Patient re-educated about  the importance of commitment to a  minimum of 150 minutes of exercise per week. The importance of healthy food choices with portion control discussed. Encouraged to start a food diary, count calories and to consider  joining a support group. Sample diet sheets offered. Goals set by the patient for the next several months.     IGT (impaired glucose tolerance) Deteriorated Patient educated about the importance of limiting  Carbohydrate intake , the need to commit to daily physical activity for a minimum of 30 minutes ,  and to commit weight loss. The fact that changes in all these areas will reduce or eliminate all together the development of diabetes is stressed.

## 2014-06-11 NOTE — Patient Instructions (Addendum)
Annual wellness, initial , in 4 month, call if you need me before please  Labs are extremely good, except for kidney function  PLEASE STOP ibuprofen this is hurting both your kidneys and  Your stomach , with reflux/ heartburn, use tylenol 325 mg one or two daily for help with arthritis , this is safer   You are referred to Dr Delle Reining will get new appt for Dr Merlene Laughter before you leave  Non fasting chem 7 and eGFR in December  HBA1C, TSH, fasting cmp and EGFR and lipid in 4.5 month

## 2014-06-15 ENCOUNTER — Other Ambulatory Visit: Payer: Self-pay | Admitting: Family Medicine

## 2014-06-17 ENCOUNTER — Encounter (INDEPENDENT_AMBULATORY_CARE_PROVIDER_SITE_OTHER): Payer: Self-pay | Admitting: *Deleted

## 2014-06-18 ENCOUNTER — Other Ambulatory Visit: Payer: Self-pay

## 2014-06-18 DIAGNOSIS — M5489 Other dorsalgia: Secondary | ICD-10-CM

## 2014-06-18 MED ORDER — METHADONE HCL 10 MG PO TABS: 10.0000 mg | ORAL_TABLET | Freq: Three times a day (TID) | ORAL | Status: DC

## 2014-06-20 NOTE — Assessment & Plan Note (Signed)
Deteriorated Patient educated about the importance of limiting  Carbohydrate intake , the need to commit to daily physical activity for a minimum of 30 minutes , and to commit weight loss. The fact that changes in all these areas will reduce or eliminate all together the development of diabetes is stressed.    

## 2014-06-20 NOTE — Assessment & Plan Note (Signed)
Chronic pain management as before, no med change Safety re fall prevention also discussed

## 2014-06-20 NOTE — Assessment & Plan Note (Signed)
Marked improvemnt, no med change Hyperlipidemia:Low fat diet discussed and encouraged.  Encouraged to increase moblity

## 2014-06-20 NOTE — Assessment & Plan Note (Signed)
Unchanged. Patient re-educated about  the importance of commitment to a  minimum of 150 minutes of exercise per week. The importance of healthy food choices with portion control discussed. Encouraged to start a food diary, count calories and to consider  joining a support group. Sample diet sheets offered. Goals set by the patient for the next several months.    

## 2014-06-20 NOTE — Assessment & Plan Note (Signed)
Controlled, no change in medication  

## 2014-06-20 NOTE — Assessment & Plan Note (Signed)
Controlled, no change in medication DASH diet and commitment to daily physical activity for a minimum of 30 minutes discussed and encouraged, as a part of hypertension management. The importance of attaining a healthy weight is also discussed.  

## 2014-06-25 ENCOUNTER — Other Ambulatory Visit: Payer: Self-pay | Admitting: Family Medicine

## 2014-07-07 ENCOUNTER — Other Ambulatory Visit: Payer: Self-pay | Admitting: Family Medicine

## 2014-07-09 ENCOUNTER — Ambulatory Visit (INDEPENDENT_AMBULATORY_CARE_PROVIDER_SITE_OTHER): Payer: Medicare Other | Admitting: Internal Medicine

## 2014-07-10 ENCOUNTER — Other Ambulatory Visit: Payer: Self-pay | Admitting: Family Medicine

## 2014-07-17 ENCOUNTER — Other Ambulatory Visit: Payer: Self-pay | Admitting: Family Medicine

## 2014-07-24 ENCOUNTER — Other Ambulatory Visit: Payer: Self-pay

## 2014-07-24 DIAGNOSIS — M5489 Other dorsalgia: Secondary | ICD-10-CM

## 2014-07-24 MED ORDER — METHADONE HCL 10 MG PO TABS: 10.0000 mg | ORAL_TABLET | Freq: Three times a day (TID) | ORAL | Status: DC

## 2014-07-27 DIAGNOSIS — K222 Esophageal obstruction: Secondary | ICD-10-CM | POA: Diagnosis not present

## 2014-07-27 DIAGNOSIS — K219 Gastro-esophageal reflux disease without esophagitis: Secondary | ICD-10-CM | POA: Diagnosis not present

## 2014-07-28 ENCOUNTER — Other Ambulatory Visit: Payer: Self-pay | Admitting: Family Medicine

## 2014-07-28 ENCOUNTER — Other Ambulatory Visit: Payer: Self-pay

## 2014-07-28 DIAGNOSIS — I10 Essential (primary) hypertension: Secondary | ICD-10-CM

## 2014-07-28 LAB — COMPLETE METABOLIC PANEL WITH GFR
ALBUMIN: 3.6 g/dL (ref 3.5–5.2)
ALT: 12 U/L (ref 0–35)
AST: 19 U/L (ref 0–37)
Alkaline Phosphatase: 54 U/L (ref 39–117)
BUN: 24 mg/dL — AB (ref 6–23)
CALCIUM: 8.8 mg/dL (ref 8.4–10.5)
CHLORIDE: 105 meq/L (ref 96–112)
CO2: 25 mEq/L (ref 19–32)
Creat: 1.9 mg/dL — ABNORMAL HIGH (ref 0.50–1.10)
GFR, Est African American: 30 mL/min — ABNORMAL LOW
GFR, Est Non African American: 26 mL/min — ABNORMAL LOW
Glucose, Bld: 120 mg/dL — ABNORMAL HIGH (ref 70–99)
POTASSIUM: 4.9 meq/L (ref 3.5–5.3)
Sodium: 138 mEq/L (ref 135–145)
TOTAL PROTEIN: 6.4 g/dL (ref 6.0–8.3)
Total Bilirubin: 0.3 mg/dL (ref 0.2–1.2)

## 2014-07-28 NOTE — Addendum Note (Signed)
Addended by: Eual Fines on: 07/28/2014 03:41 PM   Modules accepted: Orders

## 2014-07-29 ENCOUNTER — Other Ambulatory Visit: Payer: Self-pay

## 2014-07-29 MED ORDER — TRAZODONE HCL 100 MG PO TABS: 100.0000 mg | ORAL_TABLET | Freq: Every day | ORAL | Status: DC

## 2014-08-05 ENCOUNTER — Other Ambulatory Visit: Payer: Self-pay | Admitting: Family Medicine

## 2014-08-13 ENCOUNTER — Other Ambulatory Visit: Payer: Self-pay | Admitting: Family Medicine

## 2014-08-18 ENCOUNTER — Ambulatory Visit (INDEPENDENT_AMBULATORY_CARE_PROVIDER_SITE_OTHER): Payer: Medicare Other | Admitting: Internal Medicine

## 2014-08-21 ENCOUNTER — Other Ambulatory Visit: Payer: Self-pay

## 2014-08-21 DIAGNOSIS — M5489 Other dorsalgia: Secondary | ICD-10-CM

## 2014-08-21 MED ORDER — METHADONE HCL 10 MG PO TABS: 10.0000 mg | ORAL_TABLET | Freq: Three times a day (TID) | ORAL | Status: DC

## 2014-08-23 ENCOUNTER — Other Ambulatory Visit: Payer: Self-pay | Admitting: Family Medicine

## 2014-08-25 ENCOUNTER — Ambulatory Visit: Payer: Medicare Other

## 2014-08-25 ENCOUNTER — Other Ambulatory Visit: Payer: Self-pay | Admitting: Family Medicine

## 2014-08-25 ENCOUNTER — Telehealth: Payer: Self-pay

## 2014-08-25 VITALS — BP 158/80 | Wt 204.0 lb

## 2014-08-25 DIAGNOSIS — I10 Essential (primary) hypertension: Secondary | ICD-10-CM | POA: Diagnosis not present

## 2014-08-25 DIAGNOSIS — R21 Rash and other nonspecific skin eruption: Secondary | ICD-10-CM

## 2014-08-25 MED ORDER — PREDNISONE (PAK) 5 MG PO TABS: 5.0000 mg | ORAL_TABLET | ORAL | Status: DC

## 2014-08-25 MED ORDER — PREDNISONE (PAK) 5 MG PO TABS: ORAL_TABLET | ORAL | Status: DC

## 2014-08-25 MED ORDER — AMLODIPINE BESYLATE 2.5 MG PO TABS: 2.5000 mg | ORAL_TABLET | Freq: Every day | ORAL | Status: DC

## 2014-08-25 NOTE — Telephone Encounter (Signed)
Rash x 1 week on legs and thighs and back. Very itchy, only slightly red. Wants something for it while here for BP check

## 2014-08-25 NOTE — Telephone Encounter (Signed)
PREDNIISONE DOSE PACK PRESCRIBED AND AMLODIPIINE SENT IN

## 2014-08-25 NOTE — Addendum Note (Signed)
Addended by: Eual Fines on: 08/25/2014 02:37 PM   Modules accepted: Orders

## 2014-08-25 NOTE — Progress Notes (Signed)
Prednisone dose pak and amlodipine sent in per Dr

## 2014-08-26 LAB — BASIC METABOLIC PANEL WITH GFR
BUN: 33 mg/dL — AB (ref 6–23)
CO2: 27 meq/L (ref 19–32)
CREATININE: 2.11 mg/dL — AB (ref 0.50–1.10)
Calcium: 8.9 mg/dL (ref 8.4–10.5)
Chloride: 104 mEq/L (ref 96–112)
GFR, Est African American: 26 mL/min — ABNORMAL LOW
GFR, Est Non African American: 23 mL/min — ABNORMAL LOW
Glucose, Bld: 104 mg/dL — ABNORMAL HIGH (ref 70–99)
Potassium: 4.8 mEq/L (ref 3.5–5.3)
Sodium: 137 mEq/L (ref 135–145)

## 2014-09-03 ENCOUNTER — Other Ambulatory Visit: Payer: Self-pay | Admitting: Family Medicine

## 2014-09-07 ENCOUNTER — Telehealth: Payer: Self-pay

## 2014-09-07 DIAGNOSIS — R21 Rash and other nonspecific skin eruption: Secondary | ICD-10-CM

## 2014-09-07 NOTE — Telephone Encounter (Signed)
Yes and send in a f prednisone dose pack if she cannot be seen this week please

## 2014-09-07 NOTE — Addendum Note (Signed)
Addended by: Eual Fines on: 09/07/2014 02:41 PM   Modules accepted: Orders

## 2014-09-07 NOTE — Telephone Encounter (Signed)
Patient  Aware and rederral entered

## 2014-09-07 NOTE — Telephone Encounter (Signed)
Rash has spread and she wants to go see Dr Nevada Crane for it. Has an extra prednisone at home and wants to know if she can take it while she waits for appt. Ok to refer?

## 2014-09-12 ENCOUNTER — Other Ambulatory Visit: Payer: Self-pay | Admitting: Family Medicine

## 2014-09-14 ENCOUNTER — Other Ambulatory Visit: Payer: Self-pay

## 2014-09-14 MED ORDER — ROSUVASTATIN CALCIUM 40 MG PO TABS: 40.0000 mg | ORAL_TABLET | Freq: Every day | ORAL | Status: DC

## 2014-09-16 DIAGNOSIS — L309 Dermatitis, unspecified: Secondary | ICD-10-CM | POA: Diagnosis not present

## 2014-09-16 DIAGNOSIS — I8310 Varicose veins of unspecified lower extremity with inflammation: Secondary | ICD-10-CM | POA: Diagnosis not present

## 2014-09-21 ENCOUNTER — Other Ambulatory Visit: Payer: Self-pay | Admitting: Family Medicine

## 2014-09-21 ENCOUNTER — Other Ambulatory Visit: Payer: Self-pay

## 2014-09-21 DIAGNOSIS — M5489 Other dorsalgia: Secondary | ICD-10-CM

## 2014-09-21 MED ORDER — METHADONE HCL 10 MG PO TABS: 10.0000 mg | ORAL_TABLET | Freq: Three times a day (TID) | ORAL | Status: DC

## 2014-09-23 ENCOUNTER — Telehealth (INDEPENDENT_AMBULATORY_CARE_PROVIDER_SITE_OTHER): Payer: Self-pay | Admitting: *Deleted

## 2014-09-23 ENCOUNTER — Ambulatory Visit (INDEPENDENT_AMBULATORY_CARE_PROVIDER_SITE_OTHER): Payer: Medicare Other | Admitting: Internal Medicine

## 2014-09-23 ENCOUNTER — Other Ambulatory Visit (INDEPENDENT_AMBULATORY_CARE_PROVIDER_SITE_OTHER): Payer: Self-pay | Admitting: *Deleted

## 2014-09-23 ENCOUNTER — Encounter (INDEPENDENT_AMBULATORY_CARE_PROVIDER_SITE_OTHER): Payer: Self-pay | Admitting: Internal Medicine

## 2014-09-23 ENCOUNTER — Telehealth: Payer: Self-pay | Admitting: Family Medicine

## 2014-09-23 VITALS — BP 120/48 | HR 72 | Temp 98.2°F | Ht 64.0 in | Wt 204.8 lb

## 2014-09-23 DIAGNOSIS — R1310 Dysphagia, unspecified: Secondary | ICD-10-CM

## 2014-09-23 DIAGNOSIS — R1314 Dysphagia, pharyngoesophageal phase: Secondary | ICD-10-CM

## 2014-09-23 DIAGNOSIS — Z1211 Encounter for screening for malignant neoplasm of colon: Secondary | ICD-10-CM

## 2014-09-23 MED ORDER — PEG-KCL-NACL-NASULF-NA ASC-C 100 G PO SOLR: 1.0000 | Freq: Once | ORAL | Status: DC

## 2014-09-23 NOTE — Patient Instructions (Addendum)
EGD/ED, colonoscopy. The risks and benefits such as perforation, bleeding, and infection were reviewed with the patient and is agreeable.

## 2014-09-23 NOTE — Telephone Encounter (Signed)
Patient aware that testing is not indicated for her at this time

## 2014-09-23 NOTE — Progress Notes (Signed)
Subjective:    Patient ID: Katelyn Rowland, female    DOB: 1941/06/02, 74 y.o.   MRN: [id number]  HPIReferred to our office by Dr. Moshe Cipro for dysphagia. She tells me she has acid reflux occasionally.  She stopped Motrin 3 months. She was taking 2 tabs a day.  She tells me she has trouble swallowing daily.  She says sometimes foods will lodge in her esophagus or sometimes pills will lodge. She does not notice any particular foods that bother her, except for bread.  If foods lodge, she will drink water and eat crackers. She says she has a hiatal hernia. She has a BM daily. No melena or BRRB. Hx of DVT. Hx of chronic back pain and takes Methadone.  Daughter lives about 6 miles from her.  Last colonoscopy was greater than 20 years ago she believes.    Review of Systems Widowed. Four children. Three are deceased. Two had ? type of cancer. One son had a Brain hemorrhage.    Past Medical History  Diagnosis Date  . Arthritis   . Edema of both legs   . Neck pain   . Thyroid disorder   . Reflux   . Depression   . Anxiety   . 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 Hematoma of leg     left   . PONV (postoperative nausea and vomiting)   . Headache(784.0)   . Hypertension     Katelyn Rowland  [id number]  . High blood pressure   . GERD (gastroesophageal reflux disease)   . Muscle cramps   . Diabetes mellitus 2009    no meds    Past Surgical History  Procedure Laterality Date  . Tonsillectomy    . Tonsillectomy and adenoidectomy    . Cervical spine surgery    . Vesicovaginal fistula closure w/ tah    . Unilateal oopherectomy    . Appendectomy    . Foot neuroma surgery      right   . Nasal sinus surgery    . Ivc filter placement, following recurrent left dvt  07/2009  . Thoracic discectomy  06/12/2012    Procedure: THORACIC DISCECTOMY;  Surgeon: Floyce Stakes, MD;  Location: [facility];  Service: Neurosurgery;  Laterality:  N/A;  Thoracic [employer] Thoracic Laminectomy    Allergies  Allergen Reactions  . Prednisone     ONLY THE CORTISONE INJECTIONS. Causes body to burn, like blood is burning body.   . Acetaminophen Other (See Comments)    Muscle Cramps.   . Sulfur Hives  . Warfarin Sodium     REACTION: pt devloped lrge hematoma onleg while on coumadin    Current Outpatient Prescriptions on File Prior to Visit  Medication Sig Dispense Refill  . amLODipine (NORVASC) 2.5 MG tablet Take 1 tablet (2.5 mg total) by mouth daily. 30 tablet 3  . benazepril (LOTENSIN) 40 MG tablet TAKE (1) TABLET BY MOUTH DAILY. 30 tablet 2  . cyclobenzaprine (FLEXERIL) 10 MG tablet TAKE (1) TABLET BY MOUTH AT BEDTIME FOR MUSCLE SPASM. 30 tablet 4  . esomeprazole (NEXIUM) 40 MG capsule TAKE 1 CAPSULE DAILY BEFOREBREAKFAST 90 capsule 1  . gabapentin (NEURONTIN) 300 MG capsule TAKE (1) CAPSULE BY MOUTH TWICE DAILY.(ONE AT BEDTIME) 60 capsule 2  . methadone (DOLOPHINE) 10 MG tablet Take 1 tablet (10 mg total) by mouth every 8 (eight) hours. 90 tablet 0  . polyethylene glycol powder (GLYCOLAX/MIRALAX)  powder MIX 1 CAPFUL (17 GRAMS) WITH 8OZ OF WATER OR JUICE DAILY. 527 g 2  . promethazine (PHENERGAN) 25 MG tablet TAKE 1 TABLET BY MOUTH EVERY 6 HOURS AS NEEDED. 30 tablet 3  . rosuvastatin (CRESTOR) 40 MG tablet Take 1 tablet (40 mg total) by mouth daily. 30 tablet 0  . SYNTHROID 200 MCG tablet TAKE ONE AND A HALF TABLET BY MOUTH ON MON, WED, AND FRI THEN ONE TABLET ALL OTHER DAYS. 35 tablet 2  . terbinafine (LAMISIL) 250 MG tablet TAKE ONE TABLET BY MOUTH DAILY. 28 tablet 0  . traZODone (DESYREL) 100 MG tablet Take 1 tablet (100 mg total) by mouth at bedtime. 30 tablet 3  . Vitamin D, Ergocalciferol, (DRISDOL) 50000 UNITS CAPS capsule TAKE 1 CAPSULE BY MOUTH ONCE A WEEK. 4 capsule 1   No current facility-administered medications on file prior to visit.         Objective:   Physical Exam  Filed Vitals:   09/23/14 1451    Height: 5\' 4"  (1.626 m)  Weight: 204 lb 12.8 oz (92.897 kg)   Alert and oriented. Skin warm and dry. Oral mucosa is moist.   . Sclera anicteric, conjunctivae is pink. Thyroid not enlarged. No cervical lymphadenopathy. Lungs clear. Heart regular rate and rhythm.  Abdomen is soft. Bowel sounds are positive. No hepatomegaly. No abdominal masses felt. No tenderness.  Cellulitis to both lower legs. 2+ edema to left lower leg and 1+ edema to rt lower leg       Assessment & Plan:  GERD. Takes generic Nexium and she says she can tell a difference. Dysphagia to solids and pills. Stricture needs to be ruled out.  Screening Colonoscopy. Has not had a colonoscopy in greater than 20 yrs.

## 2014-09-23 NOTE — Telephone Encounter (Signed)
Patient needs movi prep 

## 2014-10-01 ENCOUNTER — Other Ambulatory Visit (INDEPENDENT_AMBULATORY_CARE_PROVIDER_SITE_OTHER): Payer: Self-pay | Admitting: *Deleted

## 2014-10-01 ENCOUNTER — Telehealth (INDEPENDENT_AMBULATORY_CARE_PROVIDER_SITE_OTHER): Payer: Self-pay | Admitting: *Deleted

## 2014-10-01 ENCOUNTER — Encounter (INDEPENDENT_AMBULATORY_CARE_PROVIDER_SITE_OTHER): Payer: Self-pay | Admitting: *Deleted

## 2014-10-01 DIAGNOSIS — R1310 Dysphagia, unspecified: Secondary | ICD-10-CM

## 2014-10-01 NOTE — Telephone Encounter (Signed)
noted 

## 2014-10-01 NOTE — Telephone Encounter (Signed)
Patient sch'd for TCS/EGD/ED w/ propofol 10/22/14, she called today to cancel TCS part but will have EGD/ED -- she states she doesn't feel like she can go through with prep and she's afraid of being punctured

## 2014-10-13 ENCOUNTER — Encounter: Payer: Self-pay | Admitting: Family Medicine

## 2014-10-13 ENCOUNTER — Ambulatory Visit (INDEPENDENT_AMBULATORY_CARE_PROVIDER_SITE_OTHER): Payer: Medicare Other | Admitting: Family Medicine

## 2014-10-13 ENCOUNTER — Other Ambulatory Visit: Payer: Self-pay | Admitting: Family Medicine

## 2014-10-13 VITALS — BP 138/76 | HR 85 | Resp 16 | Ht 64.0 in | Wt 211.1 lb

## 2014-10-13 DIAGNOSIS — R102 Pelvic and perineal pain: Secondary | ICD-10-CM

## 2014-10-13 DIAGNOSIS — M159 Polyosteoarthritis, unspecified: Secondary | ICD-10-CM

## 2014-10-13 DIAGNOSIS — R7302 Impaired glucose tolerance (oral): Secondary | ICD-10-CM | POA: Diagnosis not present

## 2014-10-13 DIAGNOSIS — K222 Esophageal obstruction: Secondary | ICD-10-CM | POA: Diagnosis not present

## 2014-10-13 DIAGNOSIS — E559 Vitamin D deficiency, unspecified: Secondary | ICD-10-CM

## 2014-10-13 DIAGNOSIS — E038 Other specified hypothyroidism: Secondary | ICD-10-CM | POA: Diagnosis not present

## 2014-10-13 DIAGNOSIS — D539 Nutritional anemia, unspecified: Secondary | ICD-10-CM | POA: Diagnosis not present

## 2014-10-13 DIAGNOSIS — N184 Chronic kidney disease, stage 4 (severe): Secondary | ICD-10-CM

## 2014-10-13 DIAGNOSIS — I10 Essential (primary) hypertension: Secondary | ICD-10-CM | POA: Diagnosis not present

## 2014-10-13 DIAGNOSIS — R1901 Right upper quadrant abdominal swelling, mass and lump: Secondary | ICD-10-CM

## 2014-10-13 DIAGNOSIS — N189 Chronic kidney disease, unspecified: Secondary | ICD-10-CM

## 2014-10-13 DIAGNOSIS — Z9181 History of falling: Secondary | ICD-10-CM

## 2014-10-13 DIAGNOSIS — E785 Hyperlipidemia, unspecified: Secondary | ICD-10-CM

## 2014-10-13 DIAGNOSIS — K219 Gastro-esophageal reflux disease without esophagitis: Secondary | ICD-10-CM | POA: Diagnosis not present

## 2014-10-13 DIAGNOSIS — Z7409 Other reduced mobility: Secondary | ICD-10-CM

## 2014-10-13 DIAGNOSIS — R5382 Chronic fatigue, unspecified: Secondary | ICD-10-CM

## 2014-10-13 DIAGNOSIS — R296 Repeated falls: Secondary | ICD-10-CM

## 2014-10-13 DIAGNOSIS — G4700 Insomnia, unspecified: Secondary | ICD-10-CM

## 2014-10-13 DIAGNOSIS — D649 Anemia, unspecified: Secondary | ICD-10-CM | POA: Diagnosis not present

## 2014-10-13 DIAGNOSIS — Z789 Other specified health status: Secondary | ICD-10-CM

## 2014-10-13 DIAGNOSIS — R2681 Unsteadiness on feet: Secondary | ICD-10-CM

## 2014-10-13 DIAGNOSIS — D631 Anemia in chronic kidney disease: Secondary | ICD-10-CM

## 2014-10-13 DIAGNOSIS — R3912 Poor urinary stream: Secondary | ICD-10-CM

## 2014-10-13 DIAGNOSIS — R39198 Other difficulties with micturition: Secondary | ICD-10-CM

## 2014-10-13 DIAGNOSIS — R3989 Other symptoms and signs involving the genitourinary system: Secondary | ICD-10-CM

## 2014-10-13 MED ORDER — VITAMIN D (ERGOCALCIFEROL) 1.25 MG (50000 UNIT) PO CAPS: ORAL_CAPSULE | ORAL | Status: DC

## 2014-10-13 MED ORDER — ROSUVASTATIN CALCIUM 40 MG PO TABS: 40.0000 mg | ORAL_TABLET | Freq: Every day | ORAL | Status: DC

## 2014-10-13 MED ORDER — TRAZODONE HCL 100 MG PO TABS: 100.0000 mg | ORAL_TABLET | Freq: Every day | ORAL | Status: DC

## 2014-10-13 MED ORDER — NEXIUM 40 MG PO CPDR: DELAYED_RELEASE_CAPSULE | ORAL | Status: DC

## 2014-10-13 NOTE — Patient Instructions (Signed)
F/u in 6 qeeks , call if you need m before  Labs today  You will be referred to urologist and also for abdominal scan  Pls see kidney specialist, vety im[portant  We will try to authorize in generic nexium due to GERD  PLS stop caffeine  You are referred  For in home PT twice weekly for 6 weeks due to unsteady gait and pls let us know when you are ready to get help with finding in home caregiver if needed  Food Choices for Gastroesophageal Reflux Disease When you have gastroesophageal reflux disease (GERD), the foods you eat and your eating habits are very important. Choosing the right foods can help ease your discomfort.  WHAT GUIDELINES DO I NEED TO FOLLOW?   Choose fruits, vegetables, whole grains, and low-fat dairy products.   Choose low-fat meat, fish, and poultry.  Limit fats such as oils, salad dressings, butter, nuts, and avocado.   Keep a food diary. This helps you identify foods that cause symptoms.   Avoid foods that cause symptoms. These may be different for everyone.   Eat small meals often instead of 3 large meals a day.   Eat your meals slowly, in a place where you are relaxed.   Limit fried foods.   Cook foods using methods other than frying.   Avoid drinking alcohol.   Avoid drinking large amounts of liquids with your meals.   Avoid bending over or lying down until 2-3 hours after eating.  WHAT FOODS ARE NOT RECOMMENDED?  These are some foods and drinks that may make your symptoms worse: Vegetables Tomatoes. Tomato juice. Tomato and spaghetti sauce. Chili peppers. Onion and garlic. Horseradish. Fruits Oranges, grapefruit, and lemon (fruit and juice). Meats High-fat meats, fish, and poultry. This includes hot dogs, ribs, ham, sausage, salami, and bacon. Dairy Whole milk and chocolate milk. Sour cream. Cream. Butter. Ice cream. Cream cheese.  Drinks Coffee and tea. Bubbly (carbonated) drinks or energy drinks. Condiments Hot sauce.  Barbecue sauce.  Sweets/Desserts Chocolate and cocoa. Donuts. Peppermint and spearmint. Fats and Oils High-fat foods. This includes [place] fries and potato chips. Other Vinegar. Strong spices. This includes black pepper, white pepper, red pepper, cayenne, curry powder, cloves, ginger, and chili powder. The items listed above may not be a complete list of foods and drinks to avoid. Contact your dietitian for more information. Document Released: 01/23/2012 Document Revised: 07/29/2013 Document Reviewed: 05/28/2013 [facility] Patient Information 2015 [place], [place]. This information is not intended to replace advice given to you by your health care provider. Make sure you discuss any questions you have with your health care provider.

## 2014-10-14 ENCOUNTER — Other Ambulatory Visit: Payer: Self-pay

## 2014-10-14 ENCOUNTER — Telehealth: Payer: Self-pay

## 2014-10-14 DIAGNOSIS — K222 Esophageal obstruction: Principal | ICD-10-CM

## 2014-10-14 DIAGNOSIS — K219 Gastro-esophageal reflux disease without esophagitis: Secondary | ICD-10-CM

## 2014-10-14 LAB — CBC
HEMATOCRIT: 33.6 % — AB (ref 36.0–46.0)
Hemoglobin: 10.9 g/dL — ABNORMAL LOW (ref 12.0–15.0)
MCH: 30.8 pg (ref 26.0–34.0)
MCHC: 32.4 g/dL (ref 30.0–36.0)
MCV: 94.9 fL (ref 78.0–100.0)
MPV: 10.9 fL (ref 8.6–12.4)
Platelets: 214 10*3/uL (ref 150–400)
RBC: 3.54 MIL/uL — AB (ref 3.87–5.11)
RDW: 13.2 % (ref 11.5–15.5)
WBC: 7.1 10*3/uL (ref 4.0–10.5)

## 2014-10-14 LAB — COMPLETE METABOLIC PANEL WITH GFR
ALBUMIN: 3.9 g/dL (ref 3.5–5.2)
ALT: 12 U/L (ref 0–35)
AST: 18 U/L (ref 0–37)
Alkaline Phosphatase: 56 U/L (ref 39–117)
BILIRUBIN TOTAL: 0.3 mg/dL (ref 0.2–1.2)
BUN: 30 mg/dL — AB (ref 6–23)
CO2: 25 mEq/L (ref 19–32)
CREATININE: 1.72 mg/dL — AB (ref 0.50–1.10)
Calcium: 9 mg/dL (ref 8.4–10.5)
Chloride: 104 mEq/L (ref 96–112)
GFR, EST AFRICAN AMERICAN: 33 mL/min — AB
GFR, Est Non African American: 29 mL/min — ABNORMAL LOW
Glucose, Bld: 98 mg/dL (ref 70–99)
POTASSIUM: 4.9 meq/L (ref 3.5–5.3)
Sodium: 140 mEq/L (ref 135–145)
TOTAL PROTEIN: 6.4 g/dL (ref 6.0–8.3)

## 2014-10-14 LAB — TSH: TSH: 0.51 u[IU]/mL (ref 0.350–4.500)

## 2014-10-14 LAB — IRON: Iron: 112 ug/dL (ref 42–145)

## 2014-10-14 LAB — VITAMIN D 25 HYDROXY (VIT D DEFICIENCY, FRACTURES): VIT D 25 HYDROXY: 38 ng/mL (ref 30–100)

## 2014-10-14 LAB — HEMOGLOBIN A1C
HEMOGLOBIN A1C: 6.9 % — AB (ref <5.7)
Mean Plasma Glucose: 151 mg/dL — ABNORMAL HIGH (ref <117)

## 2014-10-14 MED ORDER — NEXIUM 40 MG PO CPDR: DELAYED_RELEASE_CAPSULE | ORAL | Status: DC

## 2014-10-14 NOTE — Telephone Encounter (Signed)
Concern addressed

## 2014-10-15 LAB — FERRITIN: FERRITIN: 47 ng/mL (ref 10–291)

## 2014-10-15 LAB — VITAMIN B12: VITAMIN B 12: 365 pg/mL (ref 211–911)

## 2014-10-15 NOTE — Patient Instructions (Signed)
Katelyn Rowland  10/15/2014   Your procedure is scheduled on:  10/21/2014  Report to [facility] at  700 AM.  Call this number if you have problems the morning of surgery: [phone]   Remember:   Do not eat food or drink liquids after midnight.   Take these medicines the morning of surgery with A SIP OF WATER:  Amlodipine, gabapentin, methadone, nexium, synthroid   Do not wear jewelry, make-up or nail polish.  Do not wear lotions, powders, or perfumes.   Do not shave 48 hours prior to surgery. Men may shave face and neck.  Do not bring valuables to the hospital.  [facility] is not responsible for any belongings or valuables.               Contacts, dentures or bridgework may not be worn into surgery.  Leave suitcase in the car. After surgery it may be brought to your room.  For patients admitted to the hospital, discharge time is determined by your treatment team.               Patients discharged the day of surgery will not be allowed to drive home.  Name and phone number of your driver: family  Special Instructions: N/A   Please read over the following fact sheets that you were given: Pain Booklet, Coughing and Deep Breathing, Surgical Site Infection Prevention, Anesthesia Post-op Instructions and Care and Recovery After Surgery Esophagogastroduodenoscopy Esophagogastroduodenoscopy (EGD) is a procedure to examine the lining of the esophagus, stomach, and first part of the small intestine (duodenum). A long, flexible, lighted tube with a camera attached (endoscope) is inserted down the throat to view these organs. This procedure is done to detect problems or abnormalities, such as inflammation, bleeding, ulcers, or growths, in order to treat them. The procedure lasts about 5-20 minutes. It is usually an outpatient procedure, but it may need to be performed in emergency cases in the hospital. LET YOUR CAREGIVER KNOW ABOUT:   Allergies to food or medicine.  All medicines  you are taking, including vitamins, herbs, eyedrops, and over-the-counter medicines and creams.  Use of steroids (by mouth or creams).  Previous problems you or members of your family have had with the use of anesthetics.  Any blood disorders you have.  Previous surgeries you have had.  Other health problems you have.  Possibility of pregnancy, if this applies. RISKS AND COMPLICATIONS  Generally, EGD is a safe procedure. However, as with any procedure, complications can occur. Possible complications include:  Infection.  Bleeding.  Tearing (perforation) of the esophagus, stomach, or duodenum.  Difficulty breathing or not being able to breath.  Excessive sweating.  Spasms of the larynx.  Slowed heartbeat.  Low blood pressure. BEFORE THE PROCEDURE  Do not eat or drink anything for 6-8 hours before the procedure or as directed by your caregiver.  Ask your caregiver about changing or stopping your regular medicines.  If you wear dentures, be prepared to remove them before the procedure.  Arrange for someone to drive you home after the procedure. PROCEDURE   A vein will be accessed to give medicines and fluids. A medicine to relax you (sedative) and a pain reliever will be given through that access into the vein.  A numbing medicine (local anesthetic) may be sprayed on your throat for comfort and to stop you from gagging or coughing.  A mouth guard may be placed in your mouth to protect  your teeth and to keep you from biting on the endoscope.  You will be asked to lie on your left side.  The endoscope is inserted down your throat and into the esophagus, stomach, and duodenum.  Air is put through the endoscope to allow your caregiver to view the lining of your esophagus clearly.  The esophagus, stomach, and duodenum is then examined. During the exam, your caregiver may:  Remove tissue to be examined under a microscope (biopsy) for inflammation, infection, or other  medical problems.  Remove growths.  Remove objects (foreign bodies) that are stuck.  Treat any bleeding with medicines or other devices that stop tissues from bleeding (hot cautery, clipping devices).  Widen (dilate) or stretch narrowed areas of the esophagus and stomach.  The endoscope will then be withdrawn. AFTER THE PROCEDURE  You will be taken to a recovery area to be monitored. You will be able to go home once you are stable and alert.  Do not eat or drink anything until the local anesthetic and numbing medicines have worn off. You may choke.  It is normal to feel bloated, have pain with swallowing, or have a sore throat for a short time. This will wear off.  Your caregiver should be able to discuss his or her findings with you. It will take longer to discuss the test results if any biopsies were taken. Document Released: 11/24/2004 Document Revised: 12/08/2013 Document Reviewed: 06/26/2012 [facility] Patient Information 2015 [place], [place]. This information is not intended to replace advice given to you by your health care provider. Make sure you discuss any questions you have with your health care provider. Esophageal Dilatation The esophagus is the long, narrow tube which carries food and liquid from the mouth to the stomach. Esophageal dilatation is the technique used to stretch a blocked or narrowed portion of the esophagus. This procedure is used when a part of the esophagus has become so narrow that it becomes difficult, painful or even impossible to swallow. This is generally an uncomplicated form of treatment. When this is not successful, chest surgery may be required. This is a much more extensive form of treatment with a longer recovery time. CAUSES  Some of the more common causes of blockage or strictures of the esophagus are:  Narrowing from longstanding inflammation (soreness and redness) of the lower esophagus. This comes from the constant exposure of the lower  esophagus to the acid which bubbles up from the stomach. Over time this causes scarring and narrowing of the lower esophagus.  Hiatal hernia in which a small part of the stomach bulges (herniates) up through the diaphragm. This can cause a gradual narrowing of the end of the esophagus.  Schatzki ring is a narrow ring of benign (non-cancerous) fibrous tissue which constricts the lower esophagus. The reason for this is not known.  Scleroderma is a connective tissue disorder that affects the esophagus and makes swallowing difficult.  Achalasia is an absence of nerves to the lower esophagus and to the esophageal sphincter. This is the circular muscle between the stomach and esophagus that relaxes to allow food into the stomach. After swallowing, it contracts to keep food in the stomach. This absence of nerves may be congenital (present since birth). This can cause irregular spasms of the lower esophageal muscle. This spasm does not open up to allow food and fluid through. The result is a persistent blockage with subsequent slow trickling of the esophageal contents into the stomach.  Strictures may develop from swallowing materials  which damage the esophagus. Some examples are strong acids or alkalis such as lye.  Growths such as benign (non-cancerous) and malignant (cancerous) tumors can block the esophagus.  Hereditary (present since birth) causes. DIAGNOSIS  Your caregiver often suspects this problem by taking a medical history. They will also do a physical exam. They can then prove their suspicions using X-rays and endoscopy. Endoscopy is an exam in which a tube like a small, flexible telescope is used to look at your esophagus.  TREATMENT There are different stretching (dilating) techniques that can be used. Simple bougie dilatation may be done in the office. This usually takes only a couple minutes. A numbing (anesthetic) spray of the throat is used. Endoscopy, when done, is done in an endoscopy  suite under mild sedation. When fluoroscopy is used, the procedure is performed in X-ray. Other techniques require a little longer time. Recovery is usually quick. There is no waiting time to begin eating and drinking to test success of the treatment. Following are some of the methods used. Narrowing of the esophagus is treated by making it bigger. Commonly this is a mechanical problem which can be treated with stretching. This can be done in different ways. Your caregiver will discuss these with you. Some of the means used are:  A series of graduated (increasing thickness) flexible dilators can be used. These are weighted tubes passed through the esophagus into the stomach. The tubes used become progressively larger until the desired stretched size is reached. Graduated dilators are a simple and quick way of opening the esophagus. No visualization is required.  Another method is the use of endoscopy to place a flexible wire across the stricture. The endoscope is removed and the wire left in place. A dilator with a hole through it from end to end is guided down the esophagus and across the stricture. One or more of these dilators are passed over the wire. At the end of the exam, the wire is removed. This type of treatment may be performed in the X-ray department under fluoroscopy. An advantage of this procedure is the examiner is visualizing the end opening in the esophagus.  Stretching of the esophagus may be done using balloons. Deflated balloons are placed through the endoscope and across the stricture. This type of balloon dilatation is often done at the time of endoscopy or fluoroscopy. Flexible endoscopy allows the examiner to directly view the stricture. A balloon is inserted in the deflated form into the area of narrowing. It is then inflated with air to a certain pressure that is preset for a given circumference. When inflated, it becomes sausage shaped, stretched, and makes the stricture  larger.  Achalasia requires a longer, larger balloon-type dilator. This is frequently done under X-ray control. In this situation, the spastic muscle fibers in the lower esophagus are stretched. All of the above procedures make the passage of food and water into the stomach easier. They also make it easier for stomach contents to reflux back into the esophagus. Special medications may be used following the procedure to help prevent further stricturing. Proton-pump inhibitor medications are good at decreasing the amount of acid in the stomach juice. When stomach juice refluxes into the esophagus, the juice is no longer as acidic and is less likely to burn or scar the esophagus. RISKS AND COMPLICATIONS Esophageal dilatation is usually performed effectively and without problems. Some complications that can occur are:  A small amount of bleeding almost always happens where the stretching takes place. If  this is too excessive it may require more aggressive treatment.  An uncommon complication is perforation (making a hole) of the esophagus. The esophagus is thin. It is easy to make a hole in it. If this happens, an operation may be necessary to repair this.  A small, undetected perforation could lead to an infection in the chest. This can be very serious. HOME CARE INSTRUCTIONS   If you received sedation for your procedure, do not drive, make important decisions, or perform any activities requiring your full coordination. Do not drink alcohol, take sedatives, or use any mind altering chemicals unless instructed by your caregiver.  You may use throat lozenges or warm salt water gargles if you have throat discomfort.  You can begin eating and drinking normally on return home unless instructed otherwise. Do not purposely try to force large chunks of food down to test the benefits of your procedure.  Mild discomfort can be eased with sips of ice water.  Medications for discomfort may or may not be  needed. SEEK IMMEDIATE MEDICAL CARE IF:   You begin vomiting up blood.  You develop black, tarry stools.  You develop chills or an unexplained temperature of over 101F (38.3C)  You develop chest or abdominal pain.  You develop shortness of breath, or feel light-headed or faint.  Your swallowing is becoming more painful, difficult, or you are unable to swallow. MAKE SURE YOU:   Understand these instructions.  Will watch your condition.  Will get help right away if you are not doing well or get worse. Document Released: 09/14/2005 Document Revised: 12/08/2013 Document Reviewed: 11/01/2005 [facility] Patient Information 2015 [place], [place]. This information is not intended to replace advice given to you by your health care provider. Make sure you discuss any questions you have with your health care provider. PATIENT INSTRUCTIONS POST-ANESTHESIA  IMMEDIATELY FOLLOWING SURGERY:  Do not drive or operate machinery for the first twenty four hours after surgery.  Do not make any important decisions for twenty four hours after surgery or while taking narcotic pain medications or sedatives.  If you develop intractable nausea and vomiting or a severe headache please notify your doctor immediately.  FOLLOW-UP:  Please make an appointment with your surgeon as instructed. You do not need to follow up with anesthesia unless specifically instructed to do so.  WOUND CARE INSTRUCTIONS (if applicable):  Keep a dry clean dressing on the anesthesia/puncture wound site if there is drainage.  Once the wound has quit draining you may leave it open to air.  Generally you should leave the bandage intact for twenty four hours unless there is drainage.  If the epidural site drains for more than 36-48 hours please call the anesthesia department.  QUESTIONS?:  Please feel free to call your physician or the hospital operator if you have any questions, and they will be happy to assist you.

## 2014-10-16 ENCOUNTER — Other Ambulatory Visit: Payer: Self-pay | Admitting: Family Medicine

## 2014-10-16 ENCOUNTER — Other Ambulatory Visit (HOSPITAL_COMMUNITY): Payer: Medicare Other

## 2014-10-16 ENCOUNTER — Encounter (HOSPITAL_COMMUNITY): Payer: Self-pay

## 2014-10-16 ENCOUNTER — Other Ambulatory Visit: Payer: Self-pay

## 2014-10-16 ENCOUNTER — Encounter (HOSPITAL_COMMUNITY)
Admission: RE | Admit: 2014-10-16 | Discharge: 2014-10-16 | Disposition: A | Discharge status: Home / Self Care | Payer: Medicare Other | Source: Ambulatory Visit | Attending: Internal Medicine | Admitting: Internal Medicine

## 2014-10-16 VITALS — BP 145/90 | HR 74 | Temp 98.7°F | Resp 18 | Ht 64.0 in | Wt 205.0 lb

## 2014-10-16 DIAGNOSIS — M5489 Other dorsalgia: Secondary | ICD-10-CM

## 2014-10-16 DIAGNOSIS — R1310 Dysphagia, unspecified: Secondary | ICD-10-CM

## 2014-10-16 DIAGNOSIS — Z9181 History of falling: Secondary | ICD-10-CM | POA: Insufficient documentation

## 2014-10-16 DIAGNOSIS — Z1211 Encounter for screening for malignant neoplasm of colon: Secondary | ICD-10-CM

## 2014-10-16 DIAGNOSIS — D631 Anemia in chronic kidney disease: Secondary | ICD-10-CM | POA: Insufficient documentation

## 2014-10-16 DIAGNOSIS — Z01810 Encounter for preprocedural cardiovascular examination: Secondary | ICD-10-CM | POA: Diagnosis not present

## 2014-10-16 DIAGNOSIS — R1901 Right upper quadrant abdominal swelling, mass and lump: Secondary | ICD-10-CM | POA: Insufficient documentation

## 2014-10-16 DIAGNOSIS — R2681 Unsteadiness on feet: Secondary | ICD-10-CM | POA: Insufficient documentation

## 2014-10-16 DIAGNOSIS — N184 Chronic kidney disease, stage 4 (severe): Secondary | ICD-10-CM | POA: Insufficient documentation

## 2014-10-16 DIAGNOSIS — N189 Chronic kidney disease, unspecified: Secondary | ICD-10-CM

## 2014-10-16 HISTORY — DX: Anemia, unspecified: D64.9

## 2014-10-16 MED ORDER — METHADONE HCL 10 MG PO TABS: 10.0000 mg | ORAL_TABLET | Freq: Three times a day (TID) | ORAL | Status: DC

## 2014-10-16 NOTE — Assessment & Plan Note (Signed)
Worsened, reports significant imbalance, feels unsafe as far as mobility is concerned walking to her mailbox, and also in her home has to hold on to prevent falls  Needs in home PT as she is homebound

## 2014-10-16 NOTE — Assessment & Plan Note (Signed)
Abdominal distension, possible mass/ hepatomegaly, difficult micturition, pelvic pain, needs scan

## 2014-10-16 NOTE — Assessment & Plan Note (Signed)
Home safety discussed  Pt would be more safe in an assisted living facility, since she is on her own most of the time, she is however very attatched to her own home and garden and strongly desires to remain there. Plans to look for in home help as able  for basic house keeeping help in the near future  When financially able

## 2014-10-16 NOTE — Assessment & Plan Note (Signed)
Worsened symptoms, states generic nexium is ineffective, also has dysphagia , has upcoming appt for upper endoscopy, will attempt to PA nexium based on history provided

## 2014-10-16 NOTE — Assessment & Plan Note (Signed)
Controlled, no change in medication DASH diet and commitment to daily physical activity for a minimum of 30 minutes discussed and encouraged, as a part of hypertension management. The importance of attaining a healthy weight is also discussed.  

## 2014-10-16 NOTE — Assessment & Plan Note (Signed)
Significant fall in HB, most  likely related to CKD  Will again encourage pt to re consider colonoscopy

## 2014-10-16 NOTE — Pre-Procedure Instructions (Signed)
Patietn given information to sign up for my chart at home.

## 2014-10-16 NOTE — Assessment & Plan Note (Signed)
Worsening exercise tolerance , increased difficulty with safe  ambulation , and anemia associated with chronic kidney disease

## 2014-10-16 NOTE — Assessment & Plan Note (Signed)
Needs to work on normalizing blood sugar, and keeping blood pressure controlled NSAID avoidance. Needs to resched appt with nephrology Improved

## 2014-10-16 NOTE — Assessment & Plan Note (Signed)
Controlled, no change in medication  

## 2014-10-16 NOTE — Assessment & Plan Note (Signed)
Marked deterioration making pt diabetic. Of note has had recurrent doses of prednisone in ecent time. Has already decided independently to change diet and lose weight , will need to be very diligent  Educated and needs rept test in 3 month

## 2014-10-16 NOTE — Assessment & Plan Note (Signed)
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

## 2014-10-16 NOTE — Assessment & Plan Note (Signed)
Sleep hygiene reviewed and written information offered also. Prescription sent for  medication needed.  

## 2014-10-16 NOTE — Assessment & Plan Note (Signed)
C/o poor urinary stream an difficulty voiding worsening in past 6 months, needs urologic eval

## 2014-10-16 NOTE — Progress Notes (Signed)
Subjective:    Patient ID: Katelyn Rowland, female    DOB: 09/05/1940, 74 y.o.   MRN: [id number]  HPI The PT is here for follow up and re-evaluation of chronic medical conditions, medication management and review of any available recent lab and radiology data.  Preventive health is updated, specifically  Cancer screening and Immunization.  Refuses all cancer screening , and some of recommended immunizations states has become very sick o occasion after the flu vaccine  The PT denies any adverse reactions to current medications since the last visit.  C/o excess weight gain, increased fatigue, gait instability, poor urinary strem, and abdominal pain and swelling C/o deterioration in her health in past approx 3 months notably    Review of Systems See HPI Denies recent fever or chills.c/o increased fatigue, with minimal activity needs to rest and feels increasingly unsteady when she ambulates , feels as though she will fall forward Denies sinus pressure, nasal congestion, ear pain or sore throat. Denies chest congestion, productive cough or wheezing. Denies chest pains, palpitations and leg swelling C/o  abdominal pain, generic nexium has no effect on her GERD , and she is for upper endo with dilatation in the near future, .   Denies dysuria, c/o hesitancy and progressively worsening urinary stream, wants this evaluated . C/o increased  joint pain, swelling and limitation in mobility.Feels unsafe and at high fall risk , requests PT/OT  assistance , she will need in home services as she is home bound Denies headaches, seizures, c/o loower extremity numbness, and  tingling. Denies depression, anxiety or insomnia. Denies skin break down or rash.        Objective:   Physical Exam BP 138/76 mmHg  Pulse 85  Resp 16  Ht 5\' 4"  (1.626 m)  Wt 211 lb 1.9 oz (95.763 kg)  BMI 36.22 kg/m2  SpO2 98% Patient alert and oriented and in no cardiopulmonary distress.Appears pale and fatigued  HEENT:  No facial asymmetry, EOMI,   oropharynx pink and moist.  Neck adequate ROM, no JVD, no mass.  Chest: Clear to auscultation bilaterally.  CVS: S1, S2 no murmurs, no S3.Regular rate.  ABD: obese, distended, RUQ mass, possible hepatomegaly, lower abdominal tenderness on deep palpation, no guarding or rebound, bowel sounds normal  Ext: No edema  MS: decreased  ROM spine, shoulders, hips and knees.  Skin: Intact, no ulcerations or rash noted.  Psych: Good eye contact, normal affect. Memory intact not anxious or depressed appearing.  CNS: CN 2-12 intact, grade 4  power,   throughout.no focal deficits noted.        Assessment & Plan:  GERD with stricture Worsened symptoms, states generic nexium is ineffective, also has dysphagia , has upcoming appt for upper endoscopy, will attempt to PA nexium based on history provided   Essential hypertension Controlled, no change in medication DASH diet and commitment to daily physical activity for a minimum of 30 minutes discussed and encouraged, as a part of hypertension management. The importance of attaining a healthy weight is also discussed.    Hypothyroidism Controlled, no change in medication    IGT (impaired glucose tolerance) Marked deterioration making pt diabetic. Of note has had recurrent doses of prednisone in ecent time. Has already decided independently to change diet and lose weight , will need to be very diligent  Educated and needs rept test in 3 month   INSOMNIA, CHRONIC Sleep hygiene reviewed and written information offered also. Prescription sent for  medication needed.  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 Plans to look for in home help as able  for basic house keeeping help in the near future  When financially able   CKD (chronic kidney disease) stage 4, GFR 15-29 ml/min Needs to work on normalizing blood sugar, and keeping blood pressure controlled NSAID avoidance. Needs to resched appt with nephrology Improved   Anemia in chronic kidney disease Significant fall in HB, most  likely related to CKD  Will again encourage pt to re consider colonoscopy

## 2014-10-21 ENCOUNTER — Encounter (HOSPITAL_COMMUNITY): Payer: Self-pay

## 2014-10-21 ENCOUNTER — Ambulatory Visit (HOSPITAL_COMMUNITY): Admit: 2014-10-21 | Payer: Medicare Other | Admitting: Internal Medicine

## 2014-10-21 ENCOUNTER — Ambulatory Visit (HOSPITAL_COMMUNITY): Admission: RE | Admit: 2014-10-21 | Payer: Medicare Other | Source: Ambulatory Visit | Admitting: Internal Medicine

## 2014-10-21 ENCOUNTER — Encounter (HOSPITAL_COMMUNITY): Admission: RE | Payer: Self-pay | Source: Ambulatory Visit

## 2014-10-21 SURGERY — ESOPHAGOGASTRODUODENOSCOPY (EGD) WITH PROPOFOL
Anesthesia: Monitor Anesthesia Care

## 2014-10-21 NOTE — Addendum Note (Signed)
Addended by: Denman George B on: 10/21/2014 02:26 PM   Modules accepted: Orders

## 2014-10-21 NOTE — OR Nursing (Signed)
Patient called several times this morning because she was having issues with a ride to the hospital. She called at 0740 and cancelled surgery due to inability to have a ride to the hospital.

## 2014-10-27 ENCOUNTER — Ambulatory Visit (HOSPITAL_COMMUNITY): Payer: Medicare Other

## 2014-10-28 ENCOUNTER — Other Ambulatory Visit (INDEPENDENT_AMBULATORY_CARE_PROVIDER_SITE_OTHER): Payer: Self-pay | Admitting: *Deleted

## 2014-10-28 ENCOUNTER — Encounter (INDEPENDENT_AMBULATORY_CARE_PROVIDER_SITE_OTHER): Payer: Self-pay | Admitting: *Deleted

## 2014-10-28 DIAGNOSIS — R1310 Dysphagia, unspecified: Secondary | ICD-10-CM

## 2014-11-04 ENCOUNTER — Ambulatory Visit (HOSPITAL_COMMUNITY)
Admission: RE | Admit: 2014-11-04 | Discharge: 2014-11-04 | Disposition: A | Discharge status: Home / Self Care | Payer: Medicare Other | Source: Ambulatory Visit | Attending: Family Medicine | Admitting: Family Medicine

## 2014-11-04 DIAGNOSIS — R3912 Poor urinary stream: Secondary | ICD-10-CM | POA: Insufficient documentation

## 2014-11-04 DIAGNOSIS — R1901 Right upper quadrant abdominal swelling, mass and lump: Secondary | ICD-10-CM

## 2014-11-04 DIAGNOSIS — N8329 Other ovarian cysts: Secondary | ICD-10-CM | POA: Diagnosis not present

## 2014-11-04 DIAGNOSIS — R39198 Other difficulties with micturition: Secondary | ICD-10-CM

## 2014-11-04 DIAGNOSIS — R102 Pelvic and perineal pain: Secondary | ICD-10-CM

## 2014-11-04 DIAGNOSIS — R3989 Other symptoms and signs involving the genitourinary system: Secondary | ICD-10-CM | POA: Insufficient documentation

## 2014-11-04 DIAGNOSIS — R1010 Upper abdominal pain, unspecified: Secondary | ICD-10-CM | POA: Diagnosis not present

## 2014-11-12 ENCOUNTER — Other Ambulatory Visit: Payer: Self-pay | Admitting: Family Medicine

## 2014-11-13 ENCOUNTER — Other Ambulatory Visit: Payer: Self-pay

## 2014-11-13 DIAGNOSIS — M5489 Other dorsalgia: Secondary | ICD-10-CM

## 2014-11-13 MED ORDER — METHADONE HCL 10 MG PO TABS: 10.0000 mg | ORAL_TABLET | Freq: Three times a day (TID) | ORAL | Status: DC

## 2014-11-17 DIAGNOSIS — D649 Anemia, unspecified: Secondary | ICD-10-CM | POA: Diagnosis not present

## 2014-11-17 DIAGNOSIS — N183 Chronic kidney disease, stage 3 (moderate): Secondary | ICD-10-CM | POA: Diagnosis not present

## 2014-11-17 DIAGNOSIS — R809 Proteinuria, unspecified: Secondary | ICD-10-CM | POA: Diagnosis not present

## 2014-11-17 DIAGNOSIS — E669 Obesity, unspecified: Secondary | ICD-10-CM | POA: Diagnosis not present

## 2014-11-17 DIAGNOSIS — I10 Essential (primary) hypertension: Secondary | ICD-10-CM | POA: Diagnosis not present

## 2014-11-17 DIAGNOSIS — N184 Chronic kidney disease, stage 4 (severe): Secondary | ICD-10-CM | POA: Diagnosis not present

## 2014-11-17 NOTE — Patient Instructions (Addendum)
Katelyn Rowland  11/17/2014   Your procedure is scheduled on:   11/20/2014  Report to [facility] at  11:15  AM.  Call this number if you have problems the morning of surgery: [phone]   Remember:   Do not eat food or drink liquids after midnight.   Take these medicines the morning of surgery with A SIP OF WATER:  Amlodipine, benazepril, flexaril, neurontin, methadone, nexium, phenergan, synthroid.   Do not wear jewelry, make-up or nail polish.  Do not wear lotions, powders, or perfumes.   Do not shave 48 hours prior to surgery. Men may shave face and neck.  Do not bring valuables to the hospital.  [facility] is not responsible for any belongings or valuables.               Contacts, dentures or bridgework may not be worn into surgery.  Leave suitcase in the car. After surgery it may be brought to your room.  For patients admitted to the hospital, discharge time is determined by your treatment team.               Patients discharged the day of surgery will not be allowed to drive home.  Name and phone number of your driver: family  Special Instructions: N/A   Please read over the following fact sheets that you were given: Pain Booklet, Coughing and Deep Breathing, Surgical Site Infection Prevention, Anesthesia Post-op Instructions and Care and Recovery After Surgery Esophagogastroduodenoscopy Esophagogastroduodenoscopy (EGD) is a procedure to examine the lining of the esophagus, stomach, and first part of the small intestine (duodenum). A long, flexible, lighted tube with a camera attached (endoscope) is inserted down the throat to view these organs. This procedure is done to detect problems or abnormalities, such as inflammation, bleeding, ulcers, or growths, in order to treat them. The procedure lasts about 5-20 minutes. It is usually an outpatient procedure, but it may need to be performed in emergency cases in the hospital. LET YOUR CAREGIVER KNOW ABOUT:   Allergies to  food or medicine.  All medicines you are taking, including vitamins, herbs, eyedrops, and over-the-counter medicines and creams.  Use of steroids (by mouth or creams).  Previous problems you or members of your family have had with the use of anesthetics.  Any blood disorders you have.  Previous surgeries you have had.  Other health problems you have.  Possibility of pregnancy, if this applies. RISKS AND COMPLICATIONS  Generally, EGD is a safe procedure. However, as with any procedure, complications can occur. Possible complications include:  Infection.  Bleeding.  Tearing (perforation) of the esophagus, stomach, or duodenum.  Difficulty breathing or not being able to breath.  Excessive sweating.  Spasms of the larynx.  Slowed heartbeat.  Low blood pressure. BEFORE THE PROCEDURE  Do not eat or drink anything for 6-8 hours before the procedure or as directed by your caregiver.  Ask your caregiver about changing or stopping your regular medicines.  If you wear dentures, be prepared to remove them before the procedure.  Arrange for someone to drive you home after the procedure. PROCEDURE   A vein will be accessed to give medicines and fluids. A medicine to relax you (sedative) and a pain reliever will be given through that access into the vein.  A numbing medicine (local anesthetic) may be sprayed on your throat for comfort and to stop you from gagging or coughing.  A mouth guard may be placed  in your mouth to protect your teeth and to keep you from biting on the endoscope.  You will be asked to lie on your left side.  The endoscope is inserted down your throat and into the esophagus, stomach, and duodenum.  Air is put through the endoscope to allow your caregiver to view the lining of your esophagus clearly.  The esophagus, stomach, and duodenum is then examined. During the exam, your caregiver may:  Remove tissue to be examined under a microscope (biopsy) for  inflammation, infection, or other medical problems.  Remove growths.  Remove objects (foreign bodies) that are stuck.  Treat any bleeding with medicines or other devices that stop tissues from bleeding (hot cautery, clipping devices).  Widen (dilate) or stretch narrowed areas of the esophagus and stomach.  The endoscope will then be withdrawn. AFTER THE PROCEDURE  You will be taken to a recovery area to be monitored. You will be able to go home once you are stable and alert.  Do not eat or drink anything until the local anesthetic and numbing medicines have worn off. You may choke.  It is normal to feel bloated, have pain with swallowing, or have a sore throat for a short time. This will wear off.  Your caregiver should be able to discuss his or her findings with you. It will take longer to discuss the test results if any biopsies were taken. Document Released: 11/24/2004 Document Revised: 12/08/2013 Document Reviewed: 06/26/2012 [facility] Patient Information 2015 [place], [place]. This information is not intended to replace advice given to you by your health care provider. Make sure you discuss any questions you have with your health care provider. Esophageal Dilatation The esophagus is the long, narrow tube which carries food and liquid from the mouth to the stomach. Esophageal dilatation is the technique used to stretch a blocked or narrowed portion of the esophagus. This procedure is used when a part of the esophagus has become so narrow that it becomes difficult, painful or even impossible to swallow. This is generally an uncomplicated form of treatment. When this is not successful, chest surgery may be required. This is a much more extensive form of treatment with a longer recovery time. CAUSES  Some of the more common causes of blockage or strictures of the esophagus are:  Narrowing from longstanding inflammation (soreness and redness) of the lower esophagus. This comes from the  constant exposure of the lower esophagus to the acid which bubbles up from the stomach. Over time this causes scarring and narrowing of the lower esophagus.  Hiatal hernia in which a small part of the stomach bulges (herniates) up through the diaphragm. This can cause a gradual narrowing of the end of the esophagus.  Schatzki ring is a narrow ring of benign (non-cancerous) fibrous tissue which constricts the lower esophagus. The reason for this is not known.  Scleroderma is a connective tissue disorder that affects the esophagus and makes swallowing difficult.  Achalasia is an absence of nerves to the lower esophagus and to the esophageal sphincter. This is the circular muscle between the stomach and esophagus that relaxes to allow food into the stomach. After swallowing, it contracts to keep food in the stomach. This absence of nerves may be congenital (present since birth). This can cause irregular spasms of the lower esophageal muscle. This spasm does not open up to allow food and fluid through. The result is a persistent blockage with subsequent slow trickling of the esophageal contents into the stomach.  Strictures  may develop from swallowing materials which damage the esophagus. Some examples are strong acids or alkalis such as lye.  Growths such as benign (non-cancerous) and malignant (cancerous) tumors can block the esophagus.  Hereditary (present since birth) causes. DIAGNOSIS  Your caregiver often suspects this problem by taking a medical history. They will also do a physical exam. They can then prove their suspicions using X-rays and endoscopy. Endoscopy is an exam in which a tube like a small, flexible telescope is used to look at your esophagus.  TREATMENT There are different stretching (dilating) techniques that can be used. Simple bougie dilatation may be done in the office. This usually takes only a couple minutes. A numbing (anesthetic) spray of the throat is used. Endoscopy, when  done, is done in an endoscopy suite under mild sedation. When fluoroscopy is used, the procedure is performed in X-ray. Other techniques require a little longer time. Recovery is usually quick. There is no waiting time to begin eating and drinking to test success of the treatment. Following are some of the methods used. Narrowing of the esophagus is treated by making it bigger. Commonly this is a mechanical problem which can be treated with stretching. This can be done in different ways. Your caregiver will discuss these with you. Some of the means used are:  A series of graduated (increasing thickness) flexible dilators can be used. These are weighted tubes passed through the esophagus into the stomach. The tubes used become progressively larger until the desired stretched size is reached. Graduated dilators are a simple and quick way of opening the esophagus. No visualization is required.  Another method is the use of endoscopy to place a flexible wire across the stricture. The endoscope is removed and the wire left in place. A dilator with a hole through it from end to end is guided down the esophagus and across the stricture. One or more of these dilators are passed over the wire. At the end of the exam, the wire is removed. This type of treatment may be performed in the X-ray department under fluoroscopy. An advantage of this procedure is the examiner is visualizing the end opening in the esophagus.  Stretching of the esophagus may be done using balloons. Deflated balloons are placed through the endoscope and across the stricture. This type of balloon dilatation is often done at the time of endoscopy or fluoroscopy. Flexible endoscopy allows the examiner to directly view the stricture. A balloon is inserted in the deflated form into the area of narrowing. It is then inflated with air to a certain pressure that is preset for a given circumference. When inflated, it becomes sausage shaped, stretched, and  makes the stricture larger.  Achalasia requires a longer, larger balloon-type dilator. This is frequently done under X-ray control. In this situation, the spastic muscle fibers in the lower esophagus are stretched. All of the above procedures make the passage of food and water into the stomach easier. They also make it easier for stomach contents to reflux back into the esophagus. Special medications may be used following the procedure to help prevent further stricturing. Proton-pump inhibitor medications are good at decreasing the amount of acid in the stomach juice. When stomach juice refluxes into the esophagus, the juice is no longer as acidic and is less likely to burn or scar the esophagus. RISKS AND COMPLICATIONS Esophageal dilatation is usually performed effectively and without problems. Some complications that can occur are:  A small amount of bleeding almost always happens where  the stretching takes place. If this is too excessive it may require more aggressive treatment.  An uncommon complication is perforation (making a hole) of the esophagus. The esophagus is thin. It is easy to make a hole in it. If this happens, an operation may be necessary to repair this.  A small, undetected perforation could lead to an infection in the chest. This can be very serious. HOME CARE INSTRUCTIONS   If you received sedation for your procedure, do not drive, make important decisions, or perform any activities requiring your full coordination. Do not drink alcohol, take sedatives, or use any mind altering chemicals unless instructed by your caregiver.  You may use throat lozenges or warm salt water gargles if you have throat discomfort.  You can begin eating and drinking normally on return home unless instructed otherwise. Do not purposely try to force large chunks of food down to test the benefits of your procedure.  Mild discomfort can be eased with sips of ice water.  Medications for discomfort may  or may not be needed. SEEK IMMEDIATE MEDICAL CARE IF:   You begin vomiting up blood.  You develop black, tarry stools.  You develop chills or an unexplained temperature of over 101F (38.3C)  You develop chest or abdominal pain.  You develop shortness of breath, or feel light-headed or faint.  Your swallowing is becoming more painful, difficult, or you are unable to swallow. MAKE SURE YOU:   Understand these instructions.  Will watch your condition.  Will get help right away if you are not doing well or get worse. Document Released: 09/14/2005 Document Revised: 12/08/2013 Document Reviewed: 11/01/2005 [facility] Patient Information 2015 [place], [place]. This information is not intended to replace advice given to you by your health care provider. Make sure you discuss any questions you have with your health care provider. PATIENT INSTRUCTIONS POST-ANESTHESIA  IMMEDIATELY FOLLOWING SURGERY:  Do not drive or operate machinery for the first twenty four hours after surgery.  Do not make any important decisions for twenty four hours after surgery or while taking narcotic pain medications or sedatives.  If you develop intractable nausea and vomiting or a severe headache please notify your doctor immediately.  FOLLOW-UP:  Please make an appointment with your surgeon as instructed. You do not need to follow up with anesthesia unless specifically instructed to do so.  WOUND CARE INSTRUCTIONS (if applicable):  Keep a dry clean dressing on the anesthesia/puncture wound site if there is drainage.  Once the wound has quit draining you may leave it open to air.  Generally you should leave the bandage intact for twenty four hours unless there is drainage.  If the epidural site drains for more than 36-48 hours please call the anesthesia department.  QUESTIONS?:  Please feel free to call your physician or the hospital operator if you have any questions, and they will be happy to assist you.

## 2014-11-18 ENCOUNTER — Inpatient Hospital Stay (HOSPITAL_COMMUNITY): Admission: RE | Admit: 2014-11-18 | Payer: 59 | Source: Ambulatory Visit

## 2014-11-18 ENCOUNTER — Encounter (HOSPITAL_COMMUNITY)
Admission: RE | Admit: 2014-11-18 | Discharge: 2014-11-18 | Disposition: A | Discharge status: Home / Self Care | Payer: Medicare Other | Source: Ambulatory Visit | Attending: Internal Medicine | Admitting: Internal Medicine

## 2014-11-18 ENCOUNTER — Encounter (HOSPITAL_COMMUNITY): Payer: Self-pay

## 2014-11-18 VITALS — Ht 64.0 in | Wt 208.0 lb

## 2014-11-18 DIAGNOSIS — K219 Gastro-esophageal reflux disease without esophagitis: Secondary | ICD-10-CM | POA: Diagnosis not present

## 2014-11-18 DIAGNOSIS — K317 Polyp of stomach and duodenum: Secondary | ICD-10-CM | POA: Diagnosis not present

## 2014-11-18 DIAGNOSIS — D649 Anemia, unspecified: Secondary | ICD-10-CM | POA: Diagnosis not present

## 2014-11-18 DIAGNOSIS — R1310 Dysphagia, unspecified: Secondary | ICD-10-CM

## 2014-11-18 DIAGNOSIS — Z86718 Personal history of other venous thrombosis and embolism: Secondary | ICD-10-CM | POA: Diagnosis not present

## 2014-11-18 DIAGNOSIS — Z87891 Personal history of nicotine dependence: Secondary | ICD-10-CM | POA: Diagnosis not present

## 2014-11-18 DIAGNOSIS — F329 Major depressive disorder, single episode, unspecified: Secondary | ICD-10-CM | POA: Diagnosis not present

## 2014-11-18 DIAGNOSIS — Z9049 Acquired absence of other specified parts of digestive tract: Secondary | ICD-10-CM | POA: Diagnosis not present

## 2014-11-18 DIAGNOSIS — Z886 Allergy status to analgesic agent status: Secondary | ICD-10-CM | POA: Diagnosis not present

## 2014-11-18 DIAGNOSIS — N189 Chronic kidney disease, unspecified: Secondary | ICD-10-CM | POA: Diagnosis not present

## 2014-11-18 DIAGNOSIS — K222 Esophageal obstruction: Secondary | ICD-10-CM | POA: Diagnosis not present

## 2014-11-18 DIAGNOSIS — E119 Type 2 diabetes mellitus without complications: Secondary | ICD-10-CM | POA: Diagnosis not present

## 2014-11-18 DIAGNOSIS — Z882 Allergy status to sulfonamides status: Secondary | ICD-10-CM | POA: Diagnosis not present

## 2014-11-18 DIAGNOSIS — F419 Anxiety disorder, unspecified: Secondary | ICD-10-CM | POA: Diagnosis not present

## 2014-11-18 DIAGNOSIS — Z888 Allergy status to other drugs, medicaments and biological substances status: Secondary | ICD-10-CM | POA: Diagnosis not present

## 2014-11-18 DIAGNOSIS — K449 Diaphragmatic hernia without obstruction or gangrene: Secondary | ICD-10-CM | POA: Diagnosis not present

## 2014-11-18 DIAGNOSIS — E079 Disorder of thyroid, unspecified: Secondary | ICD-10-CM | POA: Diagnosis not present

## 2014-11-18 DIAGNOSIS — I129 Hypertensive chronic kidney disease with stage 1 through stage 4 chronic kidney disease, or unspecified chronic kidney disease: Secondary | ICD-10-CM | POA: Diagnosis not present

## 2014-11-18 LAB — BASIC METABOLIC PANEL
Anion gap: 7 (ref 5–15)
BUN: 27 mg/dL — AB (ref 6–23)
CHLORIDE: 103 mmol/L (ref 96–112)
CO2: 27 mmol/L (ref 19–32)
CREATININE: 1.84 mg/dL — AB (ref 0.50–1.10)
Calcium: 8.8 mg/dL (ref 8.4–10.5)
GFR calc Af Amer: 30 mL/min — ABNORMAL LOW (ref >90)
GFR calc non Af Amer: 26 mL/min — ABNORMAL LOW (ref >90)
GLUCOSE: 109 mg/dL — AB (ref 70–99)
POTASSIUM: 4.9 mmol/L (ref 3.5–5.1)
Sodium: 137 mmol/L (ref 135–145)

## 2014-11-18 LAB — CBC
HEMATOCRIT: 33 % — AB (ref 36.0–46.0)
Hemoglobin: 10.9 g/dL — ABNORMAL LOW (ref 12.0–15.0)
MCH: 31.3 pg (ref 26.0–34.0)
MCHC: 33 g/dL (ref 30.0–36.0)
MCV: 94.8 fL (ref 78.0–100.0)
PLATELETS: 208 10*3/uL (ref 150–400)
RBC: 3.48 MIL/uL — ABNORMAL LOW (ref 3.87–5.11)
RDW: 12.7 % (ref 11.5–15.5)
WBC: 6.5 10*3/uL (ref 4.0–10.5)

## 2014-11-20 ENCOUNTER — Ambulatory Visit (HOSPITAL_COMMUNITY)
Admission: RE | Admit: 2014-11-20 | Discharge: 2014-11-20 | Disposition: A | Discharge status: Home / Self Care | Payer: Medicare Other | Source: Ambulatory Visit | Attending: Internal Medicine | Admitting: Internal Medicine

## 2014-11-20 ENCOUNTER — Encounter (HOSPITAL_COMMUNITY): Payer: Self-pay | Admitting: *Deleted

## 2014-11-20 ENCOUNTER — Ambulatory Visit (HOSPITAL_COMMUNITY): Payer: Medicare Other | Admitting: Anesthesiology

## 2014-11-20 ENCOUNTER — Encounter (HOSPITAL_COMMUNITY)
Admission: RE | Disposition: A | Discharge status: Home / Self Care | Payer: Self-pay | Source: Ambulatory Visit | Attending: Internal Medicine

## 2014-11-20 DIAGNOSIS — K449 Diaphragmatic hernia without obstruction or gangrene: Secondary | ICD-10-CM | POA: Insufficient documentation

## 2014-11-20 DIAGNOSIS — Z9049 Acquired absence of other specified parts of digestive tract: Secondary | ICD-10-CM | POA: Insufficient documentation

## 2014-11-20 DIAGNOSIS — F419 Anxiety disorder, unspecified: Secondary | ICD-10-CM | POA: Insufficient documentation

## 2014-11-20 DIAGNOSIS — Z87891 Personal history of nicotine dependence: Secondary | ICD-10-CM | POA: Insufficient documentation

## 2014-11-20 DIAGNOSIS — F329 Major depressive disorder, single episode, unspecified: Secondary | ICD-10-CM | POA: Diagnosis not present

## 2014-11-20 DIAGNOSIS — Z888 Allergy status to other drugs, medicaments and biological substances status: Secondary | ICD-10-CM | POA: Insufficient documentation

## 2014-11-20 DIAGNOSIS — E119 Type 2 diabetes mellitus without complications: Secondary | ICD-10-CM | POA: Insufficient documentation

## 2014-11-20 DIAGNOSIS — Z86718 Personal history of other venous thrombosis and embolism: Secondary | ICD-10-CM | POA: Insufficient documentation

## 2014-11-20 DIAGNOSIS — R1310 Dysphagia, unspecified: Secondary | ICD-10-CM

## 2014-11-20 DIAGNOSIS — D131 Benign neoplasm of stomach: Secondary | ICD-10-CM | POA: Diagnosis not present

## 2014-11-20 DIAGNOSIS — Z886 Allergy status to analgesic agent status: Secondary | ICD-10-CM | POA: Insufficient documentation

## 2014-11-20 DIAGNOSIS — K317 Polyp of stomach and duodenum: Secondary | ICD-10-CM | POA: Insufficient documentation

## 2014-11-20 DIAGNOSIS — K222 Esophageal obstruction: Secondary | ICD-10-CM | POA: Insufficient documentation

## 2014-11-20 DIAGNOSIS — E079 Disorder of thyroid, unspecified: Secondary | ICD-10-CM | POA: Diagnosis not present

## 2014-11-20 DIAGNOSIS — Z882 Allergy status to sulfonamides status: Secondary | ICD-10-CM | POA: Insufficient documentation

## 2014-11-20 DIAGNOSIS — I129 Hypertensive chronic kidney disease with stage 1 through stage 4 chronic kidney disease, or unspecified chronic kidney disease: Secondary | ICD-10-CM | POA: Insufficient documentation

## 2014-11-20 DIAGNOSIS — D649 Anemia, unspecified: Secondary | ICD-10-CM | POA: Insufficient documentation

## 2014-11-20 DIAGNOSIS — N189 Chronic kidney disease, unspecified: Secondary | ICD-10-CM | POA: Insufficient documentation

## 2014-11-20 DIAGNOSIS — R1311 Dysphagia, oral phase: Secondary | ICD-10-CM | POA: Diagnosis not present

## 2014-11-20 DIAGNOSIS — K219 Gastro-esophageal reflux disease without esophagitis: Secondary | ICD-10-CM

## 2014-11-20 HISTORY — PX: BIOPSY: SHX5522

## 2014-11-20 HISTORY — DX: Chronic kidney disease, unspecified: N18.9

## 2014-11-20 HISTORY — PX: ESOPHAGOGASTRODUODENOSCOPY (EGD) WITH PROPOFOL: SHX5813

## 2014-11-20 HISTORY — PX: ESOPHAGEAL DILATION: SHX303

## 2014-11-20 SURGERY — ESOPHAGOGASTRODUODENOSCOPY (EGD) WITH PROPOFOL
Anesthesia: Monitor Anesthesia Care

## 2014-11-20 MED ORDER — ONDANSETRON HCL 4 MG/2ML IJ SOLN
4.0000 mg | Freq: Once | INTRAMUSCULAR | Status: AC
  Administered 2014-11-20: 4 mg via INTRAVENOUS

## 2014-11-20 MED ORDER — PROPOFOL INFUSION 10 MG/ML OPTIME
INTRAVENOUS | Status: DC | PRN
  Administered 2014-11-20: 125 ug/kg/min via INTRAVENOUS

## 2014-11-20 MED ORDER — FENTANYL CITRATE (PF) 100 MCG/2ML IJ SOLN
25.0000 ug | INTRAMUSCULAR | Status: AC
  Administered 2014-11-20: 25 ug via INTRAVENOUS

## 2014-11-20 MED ORDER — MIDAZOLAM HCL 2 MG/2ML IJ SOLN
1.0000 mg | INTRAMUSCULAR | Status: DC | PRN
  Administered 2014-11-20: 2 mg via INTRAVENOUS

## 2014-11-20 MED ORDER — FENTANYL CITRATE (PF) 100 MCG/2ML IJ SOLN: 25.0000 ug | INTRAMUSCULAR | Status: DC | PRN

## 2014-11-20 MED ORDER — FENTANYL CITRATE (PF) 100 MCG/2ML IJ SOLN
INTRAMUSCULAR | Status: AC
Start: 2014-11-20 — End: 2014-11-20
  Filled 2014-11-20: qty 2

## 2014-11-20 MED ORDER — MEPERIDINE HCL 50 MG/ML IJ SOLN: 6.2500 mg | Freq: Once | INTRAMUSCULAR | Status: DC

## 2014-11-20 MED ORDER — MIDAZOLAM HCL 2 MG/2ML IJ SOLN
INTRAMUSCULAR | Status: AC
  Filled 2014-11-20: qty 2

## 2014-11-20 MED ORDER — STERILE WATER FOR IRRIGATION IR SOLN
Status: DC | PRN
  Administered 2014-11-20: 1000 mL

## 2014-11-20 MED ORDER — ONDANSETRON HCL 4 MG/2ML IJ SOLN: 4.0000 mg | Freq: Once | INTRAMUSCULAR | Status: DC | PRN

## 2014-11-20 MED ORDER — ONDANSETRON HCL 4 MG/2ML IJ SOLN
INTRAMUSCULAR | Status: AC
  Filled 2014-11-20: qty 2

## 2014-11-20 MED ORDER — LACTATED RINGERS IV SOLN
INTRAVENOUS | Status: DC
  Administered 2014-11-20: 14:00:00 via INTRAVENOUS

## 2014-11-20 MED ORDER — LIDOCAINE VISCOUS 2 % MT SOLN
6.0000 mL | Freq: Once | OROMUCOSAL | Status: AC
  Administered 2014-11-20: 6 mL via OROMUCOSAL

## 2014-11-20 SURGICAL SUPPLY — 30 items
BALLN CRE LF 10-12 240X5.5 (BALLOONS)
BALLN DILATOR CRE 12-15 240 (BALLOONS)
BALLN DILATOR CRE 15-18 240 (BALLOONS) IMPLANT
BALLN DILATOR CRE 18-20 240 (BALLOONS) IMPLANT
BALLN DILATOR CRE WIREGUIDE (BALLOONS)
BALLOON CRE LF 10-12 240X5.5 (BALLOONS) IMPLANT
BALLOON DILATOR CRE 12-15 240 (BALLOONS) IMPLANT
BALLOON DILATOR CRE WIREGUIDE (BALLOONS) IMPLANT
BLOCK BITE 60FR ADLT L/F BLUE (MISCELLANEOUS) ×1 IMPLANT
ELECT REM PT RETURN 9FT ADLT (ELECTROSURGICAL)
ELECTRODE REM PT RTRN 9FT ADLT (ELECTROSURGICAL) IMPLANT
FLOOR PAD 36X40 (MISCELLANEOUS) ×2
FORCEP COLD BIOPSY (CUTTING FORCEPS) IMPLANT
FORCEPS BIOP RAD 4 LRG CAP 4 (CUTTING FORCEPS) ×1 IMPLANT
FORMALIN 10 PREFIL 20ML (MISCELLANEOUS) IMPLANT
KIT CLEAN ENDO COMPLIANCE (KITS) ×1 IMPLANT
MANIFOLD NEPTUNE II (INSTRUMENTS) ×2 IMPLANT
NDL SCLEROTHERAPY 25GX240 (NEEDLE) IMPLANT
NEEDLE SCLEROTHERAPY 25GX240 (NEEDLE) IMPLANT
PAD FLOOR 36X40 (MISCELLANEOUS) IMPLANT
PROBE APC STR FIRE (PROBE) IMPLANT
PROBE INJECTION GOLD (MISCELLANEOUS)
PROBE INJECTION GOLD 7FR (MISCELLANEOUS) IMPLANT
SNARE ROTATE MED OVAL 20MM (MISCELLANEOUS) IMPLANT
SNARE SHORT THROW 13M SML OVAL (MISCELLANEOUS) ×1 IMPLANT
SYR 50ML LL SCALE MARK (SYRINGE) ×1 IMPLANT
SYR INFLATE BILIARY GAUGE (MISCELLANEOUS) IMPLANT
SYR INFLATION 60ML (SYRINGE) IMPLANT
TUBING IRRIGATION ENDOGATOR (MISCELLANEOUS) ×1 IMPLANT
WATER STERILE IRR 1000ML POUR (IV SOLUTION) ×1 IMPLANT

## 2014-11-20 NOTE — Transfer of Care (Signed)
Immediate Anesthesia Transfer of Care Note  Patient: Katelyn Rowland  Procedure(s) Performed: Procedure(s) with comments: ESOPHAGOGASTRODUODENOSCOPY (EGD) WITH PROPOFOL (N/A) - 1220 - moved to 12:45 - per Lelon Frohlich pt knows to arrive at 11:15 [name] (N/A) BIOPSY (N/A)  Patient Location: PACU  Anesthesia Type:MAC  Level of Consciousness: awake, alert  and oriented  Airway & Oxygen Therapy: Patient Spontanous Breathing  Post-op Assessment: Report given to RN  Post vital signs: Reviewed and stable  Last Vitals:  Filed Vitals:   11/20/14 1340  BP: 165/89  Pulse:   Temp:   Resp: 16    Complications: No apparent anesthesia complications

## 2014-11-20 NOTE — H&P (Signed)
Katelyn Rowland is an 74 y.o. female.   Chief Complaint: Patient's here for EGD and ED. HPI: Patient is 74 year old Caucasian female who presents with over 6 month history of dysphagia to solids and pills. She had her esophagus dilated over 20 years ago. She states it feels as if she has shelf in her upper esophagus food and pills get stuck. She is had more regurgitation with pills and less with foods in order to get relief. Heartburns well controlled with therapy. She denies abdominal pain or melena. She has good appetite. She has dentures in she tells me she chews her food thoroughly.  Past Medical History  Diagnosis Date  . Arthritis   . Edema of both legs   . Neck pain   . Thyroid disorder   . Reflux   . Depression   . Anxiety   . 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 Hematoma of leg     left   . PONV (postoperative nausea and vomiting)   . Headache(784.0)   . Hypertension     Katelyn Rowland  [id number]  . High blood pressure   . GERD (gastroesophageal reflux disease)   . Muscle cramps   . Diabetes mellitus 2009    no meds  . Anemia   . Chronic kidney disease     Past Surgical History  Procedure Laterality Date  . Tonsillectomy    . Tonsillectomy and adenoidectomy    . Cervical spine surgery    . Vesicovaginal fistula closure w/ tah    . Unilateal oopherectomy    . Appendectomy    . Foot neuroma surgery      right   . Nasal sinus surgery    . Ivc filter placement, following recurrent left dvt  07/2009  . Thoracic discectomy  06/12/2012    Procedure: THORACIC DISCECTOMY;  Surgeon: Floyce Stakes, MD;  Location: [facility];  Service: Neurosurgery;  Laterality: N/A;  Thoracic [employer]-Eleven Thoracic Laminectomy  . Abdominal hysterectomy      Family History  Problem Relation Age of Onset  . Emphysema Sister   . Diabetes Mother   . Diabetes Father   . Arthritis    . Kidney disease     Social History:  reports that  she quit smoking about 4 years ago. Her smoking use included Cigarettes. She has a 42 pack-year smoking history. She has never used smokeless tobacco. She reports that she does not drink alcohol or use illicit drugs.  Allergies:  Allergies  Allergen Reactions  . Prednisone     ONLY THE CORTISONE INJECTIONS. Causes body to burn, like blood is burning body.   . Acetaminophen Other (See Comments)    Muscle Cramps.   . Sulfur Hives  . Warfarin Sodium     REACTION: pt devloped lrge hematoma onleg while on coumadin    Medications Prior to Admission  Medication Sig Dispense Refill  . amLODipine (NORVASC) 2.5 MG tablet Take 1 tablet (2.5 mg total) by mouth daily. 30 tablet 3  . clobetasol cream (TEMOVATE) 9.37 % Apply 1 application topically daily as needed. rash    . cyclobenzaprine (FLEXERIL) 10 MG tablet TAKE (1) TABLET BY MOUTH AT BEDTIME FOR MUSCLE SPASM. 30 tablet 3  . gabapentin (NEURONTIN) 300 MG capsule TAKE (1) CAPSULE BY MOUTH TWICE DAILY.(ONE AT BEDTIME) 60 capsule 2  . methadone (DOLOPHINE) 10 MG tablet Take 1 tablet (10 mg total) by mouth  every 8 (eight) hours. 90 tablet 0  . NEXIUM 40 MG capsule One capsule once daily 90 capsule 1  . promethazine (PHENERGAN) 25 MG tablet TAKE 1 TABLET BY MOUTH EVERY 6 HOURS AS NEEDED. 30 tablet 3  . rosuvastatin (CRESTOR) 40 MG tablet Take 1 tablet (40 mg total) by mouth daily. 90 tablet 1  . SYNTHROID 200 MCG tablet TAKE ONE AND A HALF TABLET BY MOUTH ON MON, WED, AND FRI THEN ONE TABLET ALL OTHER DAYS. 35 tablet 2  . traZODone (DESYREL) 100 MG tablet Take 1 tablet (100 mg total) by mouth at bedtime. 30 tablet 3  . Vitamin D, Ergocalciferol, (DRISDOL) 50000 UNITS CAPS capsule TAKE 1 CAPSULE BY MOUTH ONCE A WEEK. 4 capsule 3  . benazepril (LOTENSIN) 40 MG tablet TAKE (1) TABLET BY MOUTH DAILY. 30 tablet 2  . polyethylene glycol powder (GLYCOLAX/MIRALAX) powder MIX 1 CAPFUL (17 GRAMS) WITH 8OZ OF WATER OR JUICE DAILY. 527 g 2    Results for  orders placed or performed during the hospital encounter of 11/18/14 (from the past 48 hour(s))  CBC     Status: Abnormal   Collection Time: 11/18/14  2:45 PM  Result Value Ref Range   WBC 6.5 4.0 - 10.5 K/uL   RBC 3.48 (L) 3.87 - 5.11 MIL/uL   Hemoglobin 10.9 (L) 12.0 - 15.0 g/dL   HCT 33.0 (L) 36.0 - 46.0 %   MCV 94.8 78.0 - 100.0 fL   MCH 31.3 26.0 - 34.0 pg   MCHC 33.0 30.0 - 36.0 g/dL   RDW 12.7 11.5 - 15.5 %   Platelets 208 150 - 400 K/uL  Basic metabolic panel     Status: Abnormal   Collection Time: 11/18/14  2:45 PM  Result Value Ref Range   Sodium 137 135 - 145 mmol/L   Potassium 4.9 3.5 - 5.1 mmol/L   Chloride 103 96 - 112 mmol/L   CO2 27 19 - 32 mmol/L   Glucose, Bld 109 (H) 70 - 99 mg/dL   BUN 27 (H) 6 - 23 mg/dL   Creatinine, Ser 1.84 (H) 0.50 - 1.10 mg/dL   Calcium 8.8 8.4 - 10.5 mg/dL   GFR calc non Af Amer 26 (L) >90 mL/min   GFR calc Af Amer 30 (L) >90 mL/min    Comment: (NOTE) The eGFR has been calculated using the CKD EPI equation. This calculation has not been validated in all clinical situations. eGFR's persistently <90 mL/min signify possible Chronic Kidney Disease.    Anion gap 7 5 - 15   No results found.  ROS  Blood pressure 182/76, pulse 64, temperature 98.7 F (37.1 C), temperature source Oral, resp. rate 20, SpO2 98 %. Physical Exam  Constitutional: She appears well-developed and well-nourished.  HENT:  Mouth/Throat: Oropharynx is clear and moist.  Patient is edentulous. She has upper and lower dentures.  Eyes: Conjunctivae are normal. No scleral icterus.  Neck: No thyromegaly present.  Cardiovascular: Normal rate, regular rhythm and normal heart sounds.   No murmur heard. Respiratory: Effort normal and breath sounds normal.  GI: Soft. She exhibits no distension and no mass. There is no tenderness.  Musculoskeletal: She exhibits no edema.  Lymphadenopathy:    She has no cervical adenopathy.  Neurological: She is alert.  Skin: Skin is  warm and dry.     Assessment/Plan Dysphagia to solids and pills. Chronic GERD. EGD with ED.  [name],[name] 11/20/2014, 1:34 PM

## 2014-11-20 NOTE — Progress Notes (Signed)
Swallowing without difficulty.

## 2014-11-20 NOTE — Anesthesia Preprocedure Evaluation (Signed)
Anesthesia Evaluation  Patient identified by MRN, date of birth, ID band Patient awake    Reviewed: Allergy & Precautions, H&P , NPO status , Patient's Chart, lab work & pertinent test results  History of Anesthesia Complications (+) PONV and history of anesthetic complications  Airway Mallampati: II       Dental  (+) Edentulous Upper, Edentulous Lower   Pulmonary former smoker,  breath sounds clear to auscultation        Cardiovascular hypertension, Pt. on medications DVT Rhythm:Regular Rate:Normal     Neuro/Psych  Headaches, PSYCHIATRIC DISORDERS Anxiety Depression  Neuromuscular disease    GI/Hepatic GERD-  Medicated and Controlled,  Endo/Other  diabetesHypothyroidism   Renal/GU Renal disease     Musculoskeletal  (+) Arthritis -,   Abdominal   Peds  Hematology   Anesthesia Other Findings   Reproductive/Obstetrics                             Anesthesia Physical Anesthesia Plan  ASA: III  Anesthesia Plan: MAC   Post-op Pain Management:    Induction: Intravenous  Airway Management Planned: Simple Face Mask  Additional Equipment:   Intra-op Plan:   Post-operative Plan:   Informed Consent: I have reviewed the patients History and Physical, chart, labs and discussed the procedure including the risks, benefits and alternatives for the proposed anesthesia with the patient or authorized representative who has indicated his/her understanding and acceptance.     Plan Discussed with:   Anesthesia Plan Comments:         Anesthesia Quick Evaluation

## 2014-11-20 NOTE — Op Note (Signed)
EGD PROCEDURE REPORT  PATIENT:  Katelyn Rowland  MR#:  [id number] Birthdate:  August 28, 1940, 74 y.o., female Endoscopist:  Dr. Rogene Houston, MD Referred By:  Dr. Tula Nakayama, MD  Procedure Date: 11/20/2014  Procedure:   EGD with ED  Indications:  Patient is 74 year old Caucasian female was chronic GERD and presents with dysphagia to solids and pills. She's had this symptom for over 6 months. She denies anorexia weight loss abdominal pain or melena.            Informed Consent:  The risks, benefits, alternatives & imponderables which include, but are not limited to, bleeding, infection, perforation, drug reaction and potential missed lesion have been reviewed.  The potential for biopsy, lesion removal, esophageal dilation, etc. have also been discussed.  Questions have been answered.  All parties agreeable.  Please see history & physical in medical record for more information.  Medications:  Monitored anesthesia care. Please see anesthesia records for details. Xylocaine jelly for oropharyngeal anesthesia  Description of procedure:  The endoscope was introduced through the mouth and advanced to the second portion of the duodenum without difficulty or limitations. The mucosal surfaces were surveyed very carefully during advancement of the scope and upon withdrawal.  Findings:  Esophagus:  Mucosa of the esophagus was normal. Non-critical noted at GE junction. GEJ:  35 cm Hiatus:  38 cm Stomach:  Stomach was empty and distended very well with insufflation. Folds in the Roxanol stomach were normal. Few small hyperplastic appearing polyps are noted at gastric body. These were left alone. Antral mucosa was normal. Pyloric channel was patent. Angus fundus and cardia were unremarkable. Duodenum:  Normal bulbar and post bulbar mucosa.  Therapeutic/Diagnostic Maneuvers Performed:   Esophagus dilated by passing 12 French Maloney dilator but ring was still intact and therefore disrupted with focal  biopsy at 3 sites but no tissue was saved.  Complications:  None  Impression: Schatzki's ring and small sliding hiatal hernia. Few small hyperplastic polyps at gastric body. These were left alone. Esophagus dilated by passing 56 French Maloney dilator but ring was still intact and therefore disrupted with focal biopsy(no tissue saved).  Recommendations:  Standard instructions given. Patient will call with progress report in one week.  Kairi Tufo U  11/20/2014  2:13 PM  CC: Dr. Tula Nakayama, MD & Dr. Rayne Du ref. provider found

## 2014-11-20 NOTE — Anesthesia Postprocedure Evaluation (Signed)
  Anesthesia Post-op Note  Patient: Katelyn Rowland  Procedure(s) Performed: Procedure(s) with comments: ESOPHAGOGASTRODUODENOSCOPY (EGD) WITH PROPOFOL (N/A) - 1220 - moved to 12:45 - per Lelon Frohlich pt knows to arrive at 11:15 [place] (N/A) BIOPSY (N/A)  Patient Location: PACU  Anesthesia Type:MAC  Level of Consciousness: awake, alert  and oriented  Airway and Oxygen Therapy: Patient Spontanous Breathing  Post-op Pain: none  Post-op Assessment: Post-op Vital signs reviewed, Patient's Cardiovascular Status Stable, Respiratory Function Stable, Patent Airway and No signs of Nausea or vomiting  Post-op Vital Signs: Reviewed and stable  Last Vitals:  Filed Vitals:   11/20/14 1340  BP: 165/89  Pulse:   Temp:   Resp: 16    Complications: No apparent anesthesia complications

## 2014-11-20 NOTE — Discharge Instructions (Signed)
Resume usual medications and diet. No driving for 24 hours. Please call office with progress report in one week.     Esophagogastroduodenoscopy Care After Refer to this sheet in the next few weeks. These instructions provide you with information on caring for yourself after your procedure. Your caregiver may also give you more specific instructions. Your treatment has been planned according to current medical practices, but problems sometimes occur. Call your caregiver if you have any problems or questions after your procedure.  HOME CARE INSTRUCTIONS  Do not eat or drink anything until the numbing medicine (local anesthetic) has worn off and your gag reflex has returned. You will know that the local anesthetic has worn off when you can swallow comfortably.  Do not drive for 12 hours after the procedure or as directed by your caregiver.  Only take medicines as directed by your caregiver. SEEK MEDICAL CARE IF:   You cannot stop coughing.  You are not urinating at all or less than usual. SEEK IMMEDIATE MEDICAL CARE IF:  You have difficulty swallowing.  You cannot eat or drink.  You have worsening throat or chest pain.  You have dizziness, lightheadedness, or you faint.  You have nausea or vomiting.  You have chills.  You have a fever.  You have severe abdominal pain.  You have black, tarry, or bloody stools. Document Released: 07/10/2012 Document Reviewed: 07/10/2012 [facility] Patient Information 2015 [place]. This information is not intended to replace advice given to you by your health care provider. Make sure you discuss any questions you have with your health care provider.

## 2014-11-23 ENCOUNTER — Ambulatory Visit (INDEPENDENT_AMBULATORY_CARE_PROVIDER_SITE_OTHER): Payer: Medicare Other | Admitting: Family Medicine

## 2014-11-23 ENCOUNTER — Encounter (HOSPITAL_COMMUNITY): Payer: Self-pay | Admitting: Internal Medicine

## 2014-11-23 VITALS — BP 134/82 | HR 96 | Resp 18 | Ht 63.0 in | Wt 211.0 lb

## 2014-11-23 DIAGNOSIS — G4700 Insomnia, unspecified: Secondary | ICD-10-CM | POA: Diagnosis not present

## 2014-11-23 DIAGNOSIS — I10 Essential (primary) hypertension: Secondary | ICD-10-CM | POA: Diagnosis not present

## 2014-11-23 DIAGNOSIS — M5440 Lumbago with sciatica, unspecified side: Secondary | ICD-10-CM

## 2014-11-23 DIAGNOSIS — E785 Hyperlipidemia, unspecified: Secondary | ICD-10-CM

## 2014-11-23 DIAGNOSIS — E038 Other specified hypothyroidism: Secondary | ICD-10-CM

## 2014-11-23 DIAGNOSIS — R296 Repeated falls: Secondary | ICD-10-CM

## 2014-11-23 DIAGNOSIS — R7302 Impaired glucose tolerance (oral): Secondary | ICD-10-CM | POA: Diagnosis not present

## 2014-11-23 DIAGNOSIS — Z9181 History of falling: Secondary | ICD-10-CM

## 2014-11-23 NOTE — Patient Instructions (Addendum)
Annual wellness  In early July call if you need me before  Please ensure that you follow change in diet , as your blood sugar has increased, cut back on potatoes, and increase your green watery vegetables please   Please get labs in July, not June as stated on paper  Glad you saw  Kidney specialist, and that you have had your esophagus streteched

## 2014-11-27 ENCOUNTER — Other Ambulatory Visit (HOSPITAL_COMMUNITY): Payer: Self-pay | Admitting: Nephrology

## 2014-11-27 DIAGNOSIS — N183 Chronic kidney disease, stage 3 unspecified: Secondary | ICD-10-CM

## 2014-12-10 ENCOUNTER — Ambulatory Visit (HOSPITAL_COMMUNITY)
Admission: RE | Admit: 2014-12-10 | Discharge: 2014-12-10 | Disposition: A | Discharge status: Home / Self Care | Payer: Medicare Other | Source: Ambulatory Visit | Attending: Nephrology | Admitting: Nephrology

## 2014-12-10 ENCOUNTER — Other Ambulatory Visit (HOSPITAL_COMMUNITY): Payer: Medicare Other

## 2014-12-10 DIAGNOSIS — N184 Chronic kidney disease, stage 4 (severe): Secondary | ICD-10-CM | POA: Diagnosis not present

## 2014-12-10 DIAGNOSIS — E559 Vitamin D deficiency, unspecified: Secondary | ICD-10-CM | POA: Diagnosis not present

## 2014-12-10 DIAGNOSIS — I10 Essential (primary) hypertension: Secondary | ICD-10-CM | POA: Diagnosis not present

## 2014-12-10 DIAGNOSIS — N189 Chronic kidney disease, unspecified: Secondary | ICD-10-CM | POA: Diagnosis not present

## 2014-12-10 DIAGNOSIS — Z79899 Other long term (current) drug therapy: Secondary | ICD-10-CM | POA: Diagnosis not present

## 2014-12-10 DIAGNOSIS — D519 Vitamin B12 deficiency anemia, unspecified: Secondary | ICD-10-CM | POA: Diagnosis not present

## 2014-12-10 DIAGNOSIS — D509 Iron deficiency anemia, unspecified: Secondary | ICD-10-CM | POA: Diagnosis not present

## 2014-12-10 DIAGNOSIS — R809 Proteinuria, unspecified: Secondary | ICD-10-CM | POA: Diagnosis not present

## 2014-12-10 DIAGNOSIS — N183 Chronic kidney disease, stage 3 unspecified: Secondary | ICD-10-CM

## 2014-12-11 ENCOUNTER — Other Ambulatory Visit: Payer: Self-pay

## 2014-12-11 DIAGNOSIS — M5489 Other dorsalgia: Secondary | ICD-10-CM

## 2014-12-11 MED ORDER — METHADONE HCL 10 MG PO TABS: 10.0000 mg | ORAL_TABLET | Freq: Three times a day (TID) | ORAL | Status: DC

## 2014-12-23 ENCOUNTER — Other Ambulatory Visit: Payer: Self-pay | Admitting: Family Medicine

## 2014-12-30 ENCOUNTER — Other Ambulatory Visit: Payer: Self-pay | Admitting: Family Medicine

## 2015-01-06 ENCOUNTER — Other Ambulatory Visit: Payer: Self-pay

## 2015-01-06 DIAGNOSIS — M5489 Other dorsalgia: Secondary | ICD-10-CM

## 2015-01-06 MED ORDER — METHADONE HCL 10 MG PO TABS: 10.0000 mg | ORAL_TABLET | Freq: Three times a day (TID) | ORAL | Status: DC

## 2015-01-13 DIAGNOSIS — N184 Chronic kidney disease, stage 4 (severe): Secondary | ICD-10-CM | POA: Diagnosis not present

## 2015-01-13 DIAGNOSIS — I10 Essential (primary) hypertension: Secondary | ICD-10-CM | POA: Diagnosis not present

## 2015-01-13 DIAGNOSIS — E669 Obesity, unspecified: Secondary | ICD-10-CM | POA: Diagnosis not present

## 2015-01-13 DIAGNOSIS — N183 Chronic kidney disease, stage 3 (moderate): Secondary | ICD-10-CM | POA: Diagnosis not present

## 2015-01-13 DIAGNOSIS — R809 Proteinuria, unspecified: Secondary | ICD-10-CM | POA: Diagnosis not present

## 2015-01-13 DIAGNOSIS — D649 Anemia, unspecified: Secondary | ICD-10-CM | POA: Diagnosis not present

## 2015-01-17 NOTE — Assessment & Plan Note (Signed)
Controlled, no change in medication Updated lab needed at/ before next visit.  

## 2015-01-17 NOTE — Assessment & Plan Note (Signed)
Fall risk reduction in the home reviewed at visit

## 2015-01-17 NOTE — Assessment & Plan Note (Signed)
Sleep hygiene reviewed and written information offered also. Prescription sent for  medication needed.  

## 2015-01-17 NOTE — Assessment & Plan Note (Signed)
Controlled, no change in medication DASH diet and commitment to daily physical activity as able,is  discussed and encouraged, as a part of hypertension management. The importance of attaining a healthy weight is also discussed.  BP/Weight 11/23/2014 11/20/2014 11/18/2014 10/16/2014 10/13/2014 09/23/2014 99991111  Systolic BP Q000111Q Q000111Q - Q000111Q 0000000 123456 0000000  Diastolic BP 82 70 - 90 76 48 80  Wt. (Lbs) 211 - 208 205 211.12 204.8 204  BMI 37.39 - 35.69 35.17 36.22 35.14 36.15

## 2015-01-17 NOTE — Assessment & Plan Note (Signed)
Deteriorated. Patient re-educated about  the importance of commitment to a  minimum of 150 minutes of exercise per week.  The importance of healthy food choices with portion control discussed. Encouraged to start a food diary, count calories and to consider  joining a support group. Sample diet sheets offered. Goals set by the patient for the next several months.   Weight /BMI 11/23/2014 11/18/2014 10/16/2014  WEIGHT 211 lb 208 lb 205 lb  HEIGHT 5\' 3"  5\' 4"  5\' 4"   BMI 37.39 kg/m2 35.69 kg/m2 35.17 kg/m2    Current exercise per week 60 minutes.

## 2015-01-17 NOTE — Assessment & Plan Note (Signed)
Deteriorated, pt committed to dietary change and weight loss Updated lab needed at/ before next visit.

## 2015-01-17 NOTE — Assessment & Plan Note (Signed)
Hyperlipidemia:Low fat diet discussed and encouraged. Updated lab needed, this is past due    Lipid Panel  Lab Results  Component Value Date   CHOL 119 06/05/2014   HDL 38* 06/05/2014   LDLCALC 53 06/05/2014   TRIG 138 06/05/2014   CHOLHDL 3.1 06/05/2014

## 2015-01-17 NOTE — Progress Notes (Signed)
Subjective:    Patient ID: Katelyn Rowland, female    DOB: [birth year], 74 y.o.   MRN: [id number]  HPI The PT is here for follow up and re-evaluation of chronic medical conditions, medication management and review of any available recent lab and radiology data.  Preventive health is updated, specifically  Cancer screening and Immunization.   Questions or concerns regarding consultations or procedures which the PT has had in the interim are  addressed. The PT denies any adverse reactions to current medications since the last visit.  There are no new concerns.  There are no specific complaints       Review of Systems See HPI Denies recent fever or chills. Denies sinus pressure, nasal congestion, ear pain or sore throat. Denies chest congestion, productive cough or wheezing. Denies chest pains, palpitations and leg swelling Denies abdominal pain, nausea, vomiting,diarrhea or constipation.   Denies dysuria, frequency, hesitancy or incontinence. Chronic and unchanged  joint pain, swelling and limitation in mobility. Denies headaches, seizures, numbness, or tingling. Denies depression, anxiety or insomnia. Denies skin break down or rash.        Objective:   Physical Exam  BP 134/82 mmHg  Pulse 96  Resp 18  Ht 5\' 3"  (1.6 m)  Wt 211 lb (95.709 kg)  BMI 37.39 kg/m2  SpO2 99% Patient alert and oriented and in no cardiopulmonary distress.  HEENT: No facial asymmetry, EOMI,   oropharynx pink and moist.  Neck supple no JVD, no mass.  Chest: Clear to auscultation bilaterally.  CVS: S1, S2 no murmurs, no S3.Regular rate.  ABD: Soft non tender.   Ext: No edema  MS: Decreased  ROM spine, shoulders, hips and knees.  Skin: Intact, no ulcerations or rash noted.  Psych: Good eye contact, normal affect. Memory intact not anxious or depressed appearing.  CNS: CN 2-12 intact, power,  normal throughout.no focal deficits noted.       Assessment & Plan:  Essential  hypertension Controlled, no change in medication DASH diet and commitment to daily physical activity as able,is  discussed and encouraged, as a part of hypertension management. The importance of attaining a healthy weight is also discussed.  BP/Weight 11/23/2014 11/20/2014 11/18/2014 10/16/2014 10/13/2014 09/23/2014 99991111  Systolic BP Q000111Q Q000111Q - Q000111Q 0000000 123456 0000000  Diastolic BP 82 70 - 90 76 48 80  Wt. (Lbs) 211 - 208 205 211.12 204.8 204  BMI 37.39 - 35.69 35.17 36.22 35.14 36.15        IGT (impaired glucose tolerance) Deteriorated, pt committed to dietary change and weight loss Updated lab needed at/ before next visit.   INSOMNIA, CHRONIC Sleep hygiene reviewed and written information offered also. Prescription sent for  medication needed.   Morbid obesity Deteriorated. Patient re-educated about  the importance of commitment to a  minimum of 150 minutes of exercise per week.  The importance of healthy food choices with portion control discussed. Encouraged to start a food diary, count calories and to consider  joining a support group. Sample diet sheets offered. Goals set by the patient for the next several months.   Weight /BMI 11/23/2014 11/18/2014 10/16/2014  WEIGHT 211 lb 208 lb 205 lb  HEIGHT 5\' 3"  5\' 4"  5\' 4"   BMI 37.39 kg/m2 35.69 kg/m2 35.17 kg/m2    Current exercise per week 60 minutes.   Back pain Chronic and unchanged, pain management as before  At high risk for falls Fall risk reduction in the home reviewed at visit  Hypothyroidism  Controlled, no change in medication Updated lab needed at/ before next visit.   Hyperlipidemia LDL goal <100 Hyperlipidemia:Low fat diet discussed and encouraged. Updated lab needed, this is past due    Lipid Panel  Lab Results  Component Value Date   CHOL 119 06/05/2014   HDL 38* 06/05/2014   LDLCALC 53 06/05/2014   TRIG 138 06/05/2014   CHOLHDL 3.1 06/05/2014

## 2015-01-17 NOTE — Assessment & Plan Note (Signed)
Chronic and unchanged, pain management as before

## 2015-01-20 ENCOUNTER — Other Ambulatory Visit (HOSPITAL_COMMUNITY): Payer: Self-pay | Admitting: Nephrology

## 2015-01-20 DIAGNOSIS — R809 Proteinuria, unspecified: Secondary | ICD-10-CM

## 2015-01-21 ENCOUNTER — Other Ambulatory Visit: Payer: Self-pay | Admitting: Family Medicine

## 2015-02-02 ENCOUNTER — Other Ambulatory Visit: Payer: Self-pay | Admitting: Radiology

## 2015-02-03 ENCOUNTER — Other Ambulatory Visit: Payer: Self-pay | Admitting: Radiology

## 2015-02-04 ENCOUNTER — Encounter (HOSPITAL_COMMUNITY): Payer: Self-pay

## 2015-02-04 ENCOUNTER — Ambulatory Visit (HOSPITAL_COMMUNITY)
Admission: RE | Admit: 2015-02-04 | Discharge: 2015-02-04 | Disposition: A | Discharge status: Home / Self Care | Payer: Medicare Other | Source: Ambulatory Visit | Attending: Nephrology | Admitting: Nephrology

## 2015-02-04 DIAGNOSIS — N052 Unspecified nephritic syndrome with diffuse membranous glomerulonephritis: Secondary | ICD-10-CM | POA: Insufficient documentation

## 2015-02-04 DIAGNOSIS — Z79899 Other long term (current) drug therapy: Secondary | ICD-10-CM | POA: Diagnosis not present

## 2015-02-04 DIAGNOSIS — E1122 Type 2 diabetes mellitus with diabetic chronic kidney disease: Secondary | ICD-10-CM | POA: Diagnosis not present

## 2015-02-04 DIAGNOSIS — Z86718 Personal history of other venous thrombosis and embolism: Secondary | ICD-10-CM | POA: Insufficient documentation

## 2015-02-04 DIAGNOSIS — Z87891 Personal history of nicotine dependence: Secondary | ICD-10-CM | POA: Diagnosis not present

## 2015-02-04 DIAGNOSIS — R809 Proteinuria, unspecified: Secondary | ICD-10-CM | POA: Diagnosis not present

## 2015-02-04 DIAGNOSIS — N183 Chronic kidney disease, stage 3 (moderate): Secondary | ICD-10-CM | POA: Diagnosis not present

## 2015-02-04 DIAGNOSIS — Z882 Allergy status to sulfonamides status: Secondary | ICD-10-CM | POA: Insufficient documentation

## 2015-02-04 DIAGNOSIS — K219 Gastro-esophageal reflux disease without esophagitis: Secondary | ICD-10-CM | POA: Diagnosis not present

## 2015-02-04 DIAGNOSIS — E079 Disorder of thyroid, unspecified: Secondary | ICD-10-CM | POA: Insufficient documentation

## 2015-02-04 DIAGNOSIS — Z833 Family history of diabetes mellitus: Secondary | ICD-10-CM | POA: Insufficient documentation

## 2015-02-04 DIAGNOSIS — I129 Hypertensive chronic kidney disease with stage 1 through stage 4 chronic kidney disease, or unspecified chronic kidney disease: Secondary | ICD-10-CM | POA: Diagnosis not present

## 2015-02-04 LAB — CBC
HEMATOCRIT: 32.3 % — AB (ref 36.0–46.0)
HEMOGLOBIN: 10.5 g/dL — AB (ref 12.0–15.0)
MCH: 30.3 pg (ref 26.0–34.0)
MCHC: 32.5 g/dL (ref 30.0–36.0)
MCV: 93.4 fL (ref 78.0–100.0)
PLATELETS: 195 10*3/uL (ref 150–400)
RBC: 3.46 MIL/uL — ABNORMAL LOW (ref 3.87–5.11)
RDW: 12.8 % (ref 11.5–15.5)
WBC: 5.3 10*3/uL (ref 4.0–10.5)

## 2015-02-04 LAB — APTT: APTT: 28 s (ref 24–37)

## 2015-02-04 LAB — PROTIME-INR
INR: 1.05 (ref 0.00–1.49)
Prothrombin Time: 13.9 seconds (ref 11.6–15.2)

## 2015-02-04 MED ORDER — SODIUM CHLORIDE 0.9 % IV SOLN
Freq: Once | INTRAVENOUS | Status: AC
  Administered 2015-02-04: 08:00:00 via INTRAVENOUS

## 2015-02-04 MED ORDER — GELATIN ABSORBABLE 12-7 MM EX MISC
CUTANEOUS | Status: AC
  Filled 2015-02-04: qty 1

## 2015-02-04 MED ORDER — FENTANYL CITRATE (PF) 100 MCG/2ML IJ SOLN
INTRAMUSCULAR | Status: AC
  Filled 2015-02-04: qty 2

## 2015-02-04 MED ORDER — MIDAZOLAM HCL 2 MG/2ML IJ SOLN
INTRAMUSCULAR | Status: AC
  Filled 2015-02-04: qty 2

## 2015-02-04 MED ORDER — FENTANYL CITRATE (PF) 100 MCG/2ML IJ SOLN
INTRAMUSCULAR | Status: AC | PRN
  Administered 2015-02-04 (×3): 25 ug via INTRAVENOUS

## 2015-02-04 MED ORDER — LIDOCAINE-EPINEPHRINE 1 %-1:100000 IJ SOLN
INTRAMUSCULAR | Status: AC
  Filled 2015-02-04: qty 1

## 2015-02-04 MED ORDER — MIDAZOLAM HCL 2 MG/2ML IJ SOLN
INTRAMUSCULAR | Status: AC | PRN
  Administered 2015-02-04: 1 mg via INTRAVENOUS
  Administered 2015-02-04: 0.5 mg via INTRAVENOUS

## 2015-02-04 NOTE — Procedures (Signed)
Technically successful US guided biopsy of the inferior pole of the right kidney.  No immediate complications.

## 2015-02-04 NOTE — Sedation Documentation (Signed)
Patient is resting comfortably. 

## 2015-02-04 NOTE — Progress Notes (Signed)
Patient ambulatory to bathroom, void x1. Tolerated ambulation well, rt flank CDI.  Will continue with discharge plans

## 2015-02-04 NOTE — Discharge Instructions (Signed)
Kidney Biopsy, Care After Refer to this sheet in the next few weeks. These instructions provide you with information on caring for yourself after your procedure. Your health care provider may also give you more specific instructions. Your treatment has been planned according to current medical practices, but problems sometimes occur. Call your health care provider if you have any problems or questions after your procedure.  WHAT TO EXPECT AFTER THE PROCEDURE   You may notice blood in the urine for the first 24 hours after the biopsy.  You may feel some pain at the biopsy site for 1-2 weeks after the biopsy. HOME CARE INSTRUCTIONS  Do not lift anything heavier than 10 lb (4.5 kg) for 2 weeks.  Do not take any non-steroidal anti-inflammatory drugs (NSAIDs) or any blood thinners for a week after the biopsy unless instructed to do so by your health care provider.  Only take medicines for pain, fever, or discomfort as directed by your health care provider. SEEK MEDICAL CARE IF:  You have bloody urine more than 24 hours after the biopsy.   You develop a fever.   You cannot urinate.   You have increasing pain at the biopsy site.  SEEK IMMEDIATE MEDICAL CARE IF: You feel faint or dizzy.  Document Released: 03/26/2013 Document Reviewed: 03/26/2013 [facility] Patient Information 2015 [place]. This information is not intended to replace advice given to you by your health care provider. Make sure you discuss any questions you have with your health care provider.

## 2015-02-04 NOTE — H&P (Signed)
Chief Complaint: Proteinuria CKD stage 3  Referring Physician(s): Befekadu,Belayenh  History of Present Illness: Katelyn Rowland is a 74 y.o. female with proteinuria who is here today for US guided renal biopsy.  She feels good today and states she is pretty healthy for her age and the she "hasn't even had a cold"  Her medical history includes diabetes, hypertension, GERD, and chronic kidney disease stage 3.   Past Medical History  Diagnosis Date  . Arthritis   . Edema of both legs   . Neck pain   . Thyroid disorder   . Reflux   . Depression   . Anxiety   . 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 Hematoma of leg     left   . PONV (postoperative nausea and vomiting)   . Headache(784.0)   . Hypertension     margaret simpson  [id number]  . High blood pressure   . GERD (gastroesophageal reflux disease)   . Muscle cramps   . Diabetes mellitus 2009    no meds  . Anemia   . Chronic kidney disease     Past Surgical History  Procedure Laterality Date  . Tonsillectomy    . Tonsillectomy and adenoidectomy    . Cervical spine surgery    . Vesicovaginal fistula closure w/ tah    . Unilateal oopherectomy    . Appendectomy    . Foot neuroma surgery      right   . Nasal sinus surgery    . Ivc filter placement, following recurrent left dvt  07/2009  . Thoracic discectomy  06/12/2012    Procedure: THORACIC DISCECTOMY;  Surgeon: Floyce Stakes, MD;  Location: [place] [facility];  Service: Neurosurgery;  Laterality: N/A;  Thoracic [place] Thoracic Laminectomy  . Abdominal hysterectomy    . Esophagogastroduodenoscopy (egd) with propofol N/A 11/20/2014    Procedure: ESOPHAGOGASTRODUODENOSCOPY (EGD) WITH PROPOFOL;  Surgeon: Rogene Houston, MD;  Location: [facility];  Service: Endoscopy;  Laterality: N/A;  . Esophageal dilation N/A 11/20/2014    Procedure: ESOPHAGEAL DILATION [place];  Surgeon: Rogene Houston, MD;  Location:  [facility];  Service: Endoscopy;  Laterality: N/A;  . Esophageal biopsy N/A 11/20/2014    Procedure: BIOPSY;  Surgeon: Rogene Houston, MD;  Location: [facility];  Service: Endoscopy;  Laterality: N/A;    Allergies: Prednisone; Acetaminophen; Other; Sulfur; and Warfarin sodium  Medications: Prior to Admission medications   Medication Sig Start Date End Date Taking? Authorizing Provider  amLODipine (NORVASC) 2.5 MG tablet TAKE 1 TABLET BY MOUTH ONCE DAILY. 12/23/14   Fayrene Helper, MD  clobetasol cream (TEMOVATE) [id number] % Apply 1 application topically daily as needed. rash 09/23/14   Historical Provider, MD  cyclobenzaprine (FLEXERIL) 10 MG tablet TAKE (1) TABLET BY MOUTH AT BEDTIME FOR MUSCLE SPASM. 11/12/14   Fayrene Helper, MD  gabapentin (NEURONTIN) 300 MG capsule TAKE (1) CAPSULE BY MOUTH TWICE DAILY.(ONE AT BEDTIME) 12/23/14   Fayrene Helper, MD  methadone (DOLOPHINE) 10 MG tablet Take 1 tablet (10 mg total) by mouth every 8 (eight) hours. 01/06/15 01/06/16  Fayrene Helper, MD  NEXIUM 40 MG capsule One capsule once daily 10/14/14   Fayrene Helper, MD  polyethylene glycol powder (GLYCOLAX/MIRALAX) powder MIX 1 CAPFUL (17 GRAMS) WITH 8OZ OF WATER OR JUICE DAILY. 12/30/14   Fayrene Helper, MD  promethazine (PHENERGAN) 25 MG tablet TAKE 1  TABLET BY MOUTH EVERY 6 HOURS AS NEEDED. 11/12/14   Fayrene Helper, MD  rosuvastatin (CRESTOR) 40 MG tablet Take 1 tablet (40 mg total) by mouth daily. 10/13/14   Fayrene Helper, MD  SYNTHROID 200 MCG tablet TAKE ONE AND A HALF TABLET BY MOUTH ON MON, WED, AND FRI THEN ONE TABLET ALL OTHER DAYS. 01/21/15   Fayrene Helper, MD  traZODone (DESYREL) 100 MG tablet Take 1 tablet (100 mg total) by mouth at bedtime. 10/13/14   Fayrene Helper, MD  Vitamin D, Ergocalciferol, (DRISDOL) 50000 UNITS CAPS capsule TAKE 1 CAPSULE BY MOUTH ONCE A WEEK. 10/13/14   Fayrene Helper, MD     Family History  Problem Relation Age of Onset  . Emphysema Sister   .  Diabetes Mother   . Diabetes Father   . Arthritis    . Kidney disease      History   Social History  . Marital Status: Widowed    Spouse Name: N/A  . Number of Children: 4  . Years of Education: 14   Occupational History  . disabled    .     Social History Main Topics  . Smoking status: Former Smoker -- 1.00 packs/day for 42 years    Types: Cigarettes    Quit date: 06/04/2010  . Smokeless tobacco: Never Used  . Alcohol Use: No  . Drug Use: No  . Sexual Activity: No   Other Topics Concern  . None   Social History Narrative    Review of Systems  Constitutional: Negative for fever, chills and activity change.  Respiratory: Negative for cough, chest tightness and shortness of breath.   Cardiovascular: Negative for chest pain.  Gastrointestinal: Negative for nausea, vomiting and abdominal pain.  Genitourinary: Negative.   Musculoskeletal: Negative.   Skin: Negative.   Neurological: Negative.   Psychiatric/Behavioral: Negative.     Vital Signs: BP 168/71 mmHg  Pulse 64  Temp(Src) 97.5 F (36.4 C) (Oral)  Resp 18  Ht 5\' 4"  (1.626 m)  Wt 197 lb (89.359 kg)  BMI 33.80 kg/m2  SpO2 98%  Physical Exam  Constitutional: She is oriented to person, place, and time. She appears well-developed and well-nourished.  HENT:  Head: Normocephalic and atraumatic.  Eyes: EOM are normal.  Neck: Normal range of motion. Neck supple.  Cardiovascular: Normal rate, regular rhythm and normal heart sounds.   No murmur heard. Pulmonary/Chest: Effort normal and breath sounds normal. She has no wheezes.  Abdominal: Soft. Bowel sounds are normal.  Musculoskeletal: Normal range of motion.  Neurological: She is alert and oriented to person, place, and time.  Skin: Skin is warm and dry.  Psychiatric: She has a normal mood and affect. Her behavior is normal. Judgment and thought content normal.  Vitals reviewed.   Mallampati Score:  MD Evaluation Airway: WNL Heart: WNL Abdomen:  WNL Chest/ Lungs: WNL ASA  Classification: 2 Mallampati/Airway Score: Two  Imaging: No results found.  Labs:  CBC:  Recent Labs  10/13/14 1455 11/18/14 1445 02/04/15 0849  WBC 7.1 6.5 5.3  HGB 10.9* 10.9* 10.5*  HCT 33.6* 33.0* 32.3*  PLT 214 208 195    COAGS: No results for input(s): INR, APTT in the last 8760 hours.  BMP:  Recent Labs  07/27/14 1318 08/25/14 1312 10/13/14 1455 11/18/14 1445  NA 138 137 140 137  K 4.9 4.8 4.9 4.9  CL 105 104 104 103  CO2 25 27 25 27   GLUCOSE 120* 104*  98 109*  BUN 24* 33* 30* 27*  CALCIUM 8.8 8.9 9.0 8.8  CREATININE 1.90* 2.11* 1.72* 1.84*  GFRNONAA 26* 23* 29* 26*  GFRAA 30* 26* 33* 30*    LIVER FUNCTION TESTS:  Recent Labs  06/05/14 1233 07/27/14 1318 10/13/14 1455  BILITOT 0.3 0.3 0.3  AST 16 19 18   ALT 11 12 12   ALKPHOS 50 54 56  PROT 6.1 6.4 6.4  ALBUMIN 3.6 3.6 3.9    TUMOR MARKERS: No results for input(s): AFPTM, CEA, CA199, CHROMGRNA in the last 8760 hours.  Assessment and Plan:  Proteinuria CKD stage 3  Will plan for US guided renal biopsy today by Dr. Pascal Lux.  Thank you for this interesting consult.  I greatly enjoyed meeting Katelyn Rowland and look forward to participating in their care.  Signed: Murrell Redden PA-C 02/04/2015, 9:06 AM   I spent a total of  20 Minutes   in face to face in clinical consultation, greater than 50% of which was counseling/coordinating care for US guided renal biopsy.

## 2015-02-05 ENCOUNTER — Other Ambulatory Visit: Payer: Self-pay

## 2015-02-05 DIAGNOSIS — M5489 Other dorsalgia: Secondary | ICD-10-CM

## 2015-02-05 MED ORDER — METHADONE HCL 10 MG PO TABS: 10.0000 mg | ORAL_TABLET | Freq: Three times a day (TID) | ORAL | Status: DC

## 2015-02-10 ENCOUNTER — Other Ambulatory Visit: Payer: Self-pay

## 2015-02-10 MED ORDER — CYCLOBENZAPRINE HCL 10 MG PO TABS: ORAL_TABLET | ORAL | Status: DC

## 2015-02-16 ENCOUNTER — Telehealth: Payer: Self-pay | Admitting: Orthopedic Surgery

## 2015-02-16 ENCOUNTER — Telehealth: Payer: Self-pay | Admitting: *Deleted

## 2015-02-16 DIAGNOSIS — M25562 Pain in left knee: Secondary | ICD-10-CM

## 2015-02-16 NOTE — Telephone Encounter (Signed)
Pt wants a nurse to return her call. Please advise [phone]

## 2015-02-16 NOTE — Telephone Encounter (Signed)
Call from patient regarding left knee, new problem - states twisted approximately 5 days ago; scheduled appointment for next available; also relayed that patient may need to contact primary care or urgent care if treatment is needed prior to this appointment; states she is to see her primary care, Dr Moshe Cipro, 01/26/15, so she will address then.  Aware of appointment here with Dr Aline Brochure.

## 2015-02-16 NOTE — Telephone Encounter (Signed)
Spoke with patient and she states that she twisted her left knee going down some steps on 7/7.  She would like xray but will be unable to get this until 7/18 when she can arrange transportation.  She is asking if any medication can be prescribed in order to help her.   She has already called to be seen at Dr. Ruthe Mannan and can not be seen for at least 3 weeks.

## 2015-02-16 NOTE — Telephone Encounter (Signed)
pls send meloxicam 15 mg one daily for 10 days, and also order the xray of affected knee to be done the day she has transport, also enter referral to Dr Aline Brochure , I will sign all 3, thanks

## 2015-02-17 ENCOUNTER — Telehealth: Payer: Self-pay | Admitting: *Deleted

## 2015-02-17 MED ORDER — MELOXICAM 15 MG PO TABS: 15.0000 mg | ORAL_TABLET | Freq: Every day | ORAL | Status: DC

## 2015-02-17 NOTE — Telephone Encounter (Signed)
Pt called stating if Dr. Moshe Cipro still wanted her to have the x-ray of her knee done she can do it Friday. Please advise

## 2015-02-17 NOTE — Telephone Encounter (Signed)
Patient aware and will have xray on Friday 7/15.  And medication is scheduled to be delivered by pharmacy.

## 2015-02-17 NOTE — Addendum Note (Signed)
Addended by: Denman George B on: 02/17/2015 08:37 AM   Modules accepted: Orders

## 2015-02-17 NOTE — Telephone Encounter (Signed)
Patient aware that this is fine.  She states that she does not need a new referral to Dr. Aline Brochure she is already scheduled to see him

## 2015-02-19 ENCOUNTER — Ambulatory Visit (HOSPITAL_COMMUNITY)
Admission: RE | Admit: 2015-02-19 | Discharge: 2015-02-19 | Disposition: A | Discharge status: Home / Self Care | Payer: Medicare Other | Source: Ambulatory Visit | Attending: Family Medicine | Admitting: Family Medicine

## 2015-02-19 DIAGNOSIS — S8392XA Sprain of unspecified site of left knee, initial encounter: Secondary | ICD-10-CM | POA: Diagnosis not present

## 2015-02-19 DIAGNOSIS — I739 Peripheral vascular disease, unspecified: Secondary | ICD-10-CM | POA: Insufficient documentation

## 2015-02-19 DIAGNOSIS — M85862 Other specified disorders of bone density and structure, left lower leg: Secondary | ICD-10-CM | POA: Diagnosis not present

## 2015-02-19 DIAGNOSIS — E785 Hyperlipidemia, unspecified: Secondary | ICD-10-CM | POA: Diagnosis not present

## 2015-02-19 DIAGNOSIS — M25562 Pain in left knee: Secondary | ICD-10-CM | POA: Diagnosis not present

## 2015-02-19 DIAGNOSIS — I10 Essential (primary) hypertension: Secondary | ICD-10-CM | POA: Diagnosis not present

## 2015-02-19 DIAGNOSIS — R7302 Impaired glucose tolerance (oral): Secondary | ICD-10-CM | POA: Diagnosis not present

## 2015-02-22 ENCOUNTER — Other Ambulatory Visit: Payer: Self-pay | Admitting: Family Medicine

## 2015-02-22 LAB — HEMOGLOBIN A1C
A1c: 6.4
LDL: 39

## 2015-02-24 ENCOUNTER — Ambulatory Visit (INDEPENDENT_AMBULATORY_CARE_PROVIDER_SITE_OTHER): Payer: Medicare Other | Admitting: Family Medicine

## 2015-02-24 ENCOUNTER — Encounter: Payer: Self-pay | Admitting: Family Medicine

## 2015-02-24 VITALS — BP 130/78 | HR 78 | Resp 16 | Ht 64.0 in | Wt 208.1 lb

## 2015-02-24 DIAGNOSIS — Z0000 Encounter for general adult medical examination without abnormal findings: Secondary | ICD-10-CM

## 2015-02-24 DIAGNOSIS — M62838 Other muscle spasm: Secondary | ICD-10-CM | POA: Diagnosis not present

## 2015-02-24 MED ORDER — METHOCARBAMOL 500 MG PO TABS: 500.0000 mg | ORAL_TABLET | Freq: Three times a day (TID) | ORAL | Status: DC | PRN

## 2015-02-24 NOTE — Assessment & Plan Note (Signed)

## 2015-02-24 NOTE — Progress Notes (Signed)
Subjective:    Patient ID: Katelyn Rowland, female    DOB: Apr 12, 1941, 74 y.o.   MRN: [id number]  HPI Preventive Screening-Counseling & Management   Patient present here today for a Medicare annual wellness visit.   Current Problems (verified)   Medications Prior to Visit Allergies (verified)   PAST HISTORY  Family History (verified)   Social History  Widowed since 11/15/2005. 3 children have passed away. 1 daughter living. Lives alone. No history of drug or alcohol abuse    Risk Factors  Current exercise habits:  States she is unable to exercise because of her leg pain   Dietary issues discussed: Importance of limiting fried fatty foods and carbs and eating more fruits and vegetables    Cardiac risk factors:  Hyperlipidemia, hypertension, igt Depression Screen  (Note: if answer to either of the following is "Yes", a more complete depression screening is indicated)   Over the past two weeks, have you felt down, depressed or hopeless? No  Over the past two weeks, have you felt little interest or pleasure in doing things? No  Have you lost interest or pleasure in daily life? No  Do you often feel hopeless? No  Do you cry easily over simple problems? No   Activities of Daily Living  In your present state of health, do you have any difficulty performing the following activities?  Driving?: hasn't drove since 11/15/2004 Managing money?: No Feeding yourself?:No Getting from bed to chair?:No Climbing a flight of stairs?: hard to climb stairs with her chronic leg and back pain  Preparing food and eating?:No Bathing or showering?:No Getting dressed?:No Getting to the toilet?:No Using the toilet?:No Moving around from place to place?: takes her time. Has a walker at home incase she needs it   Fall Risk Assessment In the past year have you fallen or had a near fall?:No Are you currently taking any medications that make you dizzy?:No   Hearing Difficulties: No Do you often ask  people to speak up or repeat themselves?:No Do you experience ringing or noises in your ears?:No Do you have difficulty understanding soft or whispered voices?:No  Cognitive Testing  Alert? Yes Normal Appearance?Yes  Oriented to person? Yes Place? Yes  Time? Yes  Displays appropriate judgment?Yes  Can read the correct time from a watch face? yes Are you having problems remembering things?No  Advanced Directives have been discussed with the patient?Yes, has a living will. Her grandson has it    List the Names of Other Physician/Practitioners you currently use:  Dr Lowanda Foster (nephrologist) Dr Aline Brochure (Ortho)   Indicate any recent Medical Services you may have received from other than [facility] providers in the past year (date may be approximate).   Assessment:    Annual Wellness Exam   Plan:    .  Medicare Attestation  I have personally reviewed:  The patient's medical and social history  Their use of alcohol, tobacco or illicit drugs  Their current medications and supplements  The patient's functional ability including ADLs,fall risks, home safety risks, cognitive, and hearing and visual impairment  Diet and physical activities  Evidence for depression or mood disorders  The patient's weight, height, BMI, and visual acuity have been recorded in the chart. I have made referrals, counseling, and provided education to the patient based on review of the above and I have provided the patient with a written personalized care plan for preventive services.     Review of Systems     Objective:  Physical Exam  BP 130/78 mmHg  Pulse 78  Resp 16  Ht 5\' 4"  (1.626 m)  Wt 208 lb 1.9 oz (94.403 kg)  BMI 35.71 kg/m2  SpO2 98%       Assessment & Plan:  Medicare annual wellness visit, subsequent Annual exam as documented. Counseling done  re healthy lifestyle involving commitment to 150 minutes exercise per week, heart healthy diet, and attaining healthy weight.The importance of  adequate sleep also discussed. Regular seat belt use and home safety, is also discussed. Changes in health habits are decided on by the patient with goals and time frames  set for achieving them. Immunization and cancer screening needs are specifically addressed at this visit.

## 2015-02-24 NOTE — Patient Instructions (Addendum)
F/u in 4 month, call if you need me before  New for spasm is robaxin, 3 times daily as needed, no other medication changes, stop flexeril since not working  Thanks for choosing [facility], we consider it a privelige to serve you.  CONGRATS on improved blood sugar and great  Cholesterol

## 2015-03-01 DIAGNOSIS — E559 Vitamin D deficiency, unspecified: Secondary | ICD-10-CM | POA: Diagnosis not present

## 2015-03-01 DIAGNOSIS — D509 Iron deficiency anemia, unspecified: Secondary | ICD-10-CM | POA: Diagnosis not present

## 2015-03-01 DIAGNOSIS — N183 Chronic kidney disease, stage 3 (moderate): Secondary | ICD-10-CM | POA: Diagnosis not present

## 2015-03-01 DIAGNOSIS — D649 Anemia, unspecified: Secondary | ICD-10-CM | POA: Diagnosis not present

## 2015-03-01 DIAGNOSIS — R809 Proteinuria, unspecified: Secondary | ICD-10-CM | POA: Diagnosis not present

## 2015-03-01 DIAGNOSIS — Z79899 Other long term (current) drug therapy: Secondary | ICD-10-CM | POA: Diagnosis not present

## 2015-03-01 DIAGNOSIS — I10 Essential (primary) hypertension: Secondary | ICD-10-CM | POA: Diagnosis not present

## 2015-03-03 DIAGNOSIS — R809 Proteinuria, unspecified: Secondary | ICD-10-CM | POA: Diagnosis not present

## 2015-03-03 DIAGNOSIS — N2581 Secondary hyperparathyroidism of renal origin: Secondary | ICD-10-CM | POA: Diagnosis not present

## 2015-03-03 DIAGNOSIS — N183 Chronic kidney disease, stage 3 (moderate): Secondary | ICD-10-CM | POA: Diagnosis not present

## 2015-03-03 DIAGNOSIS — D638 Anemia in other chronic diseases classified elsewhere: Secondary | ICD-10-CM | POA: Diagnosis not present

## 2015-03-05 ENCOUNTER — Encounter: Payer: Self-pay | Admitting: Family Medicine

## 2015-03-12 ENCOUNTER — Other Ambulatory Visit: Payer: Self-pay

## 2015-03-12 DIAGNOSIS — M5489 Other dorsalgia: Secondary | ICD-10-CM

## 2015-03-12 MED ORDER — METHADONE HCL 10 MG PO TABS: 10.0000 mg | ORAL_TABLET | Freq: Three times a day (TID) | ORAL | Status: DC

## 2015-03-22 ENCOUNTER — Ambulatory Visit (INDEPENDENT_AMBULATORY_CARE_PROVIDER_SITE_OTHER): Payer: Medicare Other | Admitting: Orthopedic Surgery

## 2015-03-22 ENCOUNTER — Ambulatory Visit (INDEPENDENT_AMBULATORY_CARE_PROVIDER_SITE_OTHER): Payer: Medicare Other

## 2015-03-22 ENCOUNTER — Encounter: Payer: Self-pay | Admitting: Orthopedic Surgery

## 2015-03-22 VITALS — BP 142/66 | Ht 64.0 in | Wt 208.1 lb

## 2015-03-22 DIAGNOSIS — M19031 Primary osteoarthritis, right wrist: Secondary | ICD-10-CM | POA: Diagnosis not present

## 2015-03-22 DIAGNOSIS — M25531 Pain in right wrist: Secondary | ICD-10-CM

## 2015-03-22 DIAGNOSIS — M1811 Unilateral primary osteoarthritis of first carpometacarpal joint, right hand: Secondary | ICD-10-CM | POA: Diagnosis not present

## 2015-03-22 DIAGNOSIS — M654 Radial styloid tenosynovitis [de Quervain]: Secondary | ICD-10-CM

## 2015-03-22 MED ORDER — PREDNISONE 10 MG PO TABS: 10.0000 mg | ORAL_TABLET | Freq: Every day | ORAL | Status: DC

## 2015-03-22 NOTE — Progress Notes (Signed)
Patient ID: Katelyn Rowland, female   DOB: [birth year], 74 y.o.   MRN: [id number]  Chief Complaint  Patient presents with  . Wrist Pain    right wrist pain, no known injury    HPI Katelyn Rowland is a 74 y.o. female.  This 74 year old female was making some bread and then a few days to weeks later she started having pain in her wrist and hand associated with swelling. Endocrine severe enough to seek medical attention and she was placed in a splint for several weeks 3. She presents with continued pain and swelling in the right wrist joint right thumb and right wrist. The swelling has resolved to some degree  Review of systems she denies warmth redness or streaking in that area. She denies any trauma or fall. Denies any elbow pain. Denies weakness numbness or tingling  Review of Systems Review of Systems  Past Medical History  Diagnosis Date  . Arthritis   . Edema of both legs   . Neck pain   . Thyroid disorder   . Reflux   . Depression   . Anxiety   . 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 Hematoma of leg     left   . PONV (postoperative nausea and vomiting)   . Headache(784.0)   . Hypertension     Katelyn Rowland  [id number]  . High blood pressure   . GERD (gastroesophageal reflux disease)   . Muscle cramps   . Diabetes mellitus 2009    no meds  . Anemia   . Chronic kidney disease     Past Surgical History  Procedure Laterality Date  . Tonsillectomy    . Tonsillectomy and adenoidectomy    . Cervical spine surgery    . Vesicovaginal fistula closure w/ tah    . Unilateal oopherectomy    . Appendectomy    . Foot neuroma surgery      right   . Nasal sinus surgery    . Ivc filter placement, following recurrent left dvt  07/2009  . Thoracic discectomy  06/12/2012    Procedure: THORACIC DISCECTOMY;  Surgeon: Floyce Stakes, MD;  Location: [place] [facility];  Service: Neurosurgery;  Laterality: N/A;  Thoracic [place] Thoracic  Laminectomy  . Abdominal hysterectomy    . Esophagogastroduodenoscopy (egd) with propofol N/A 11/20/2014    Procedure: ESOPHAGOGASTRODUODENOSCOPY (EGD) WITH PROPOFOL;  Surgeon: Rogene Houston, MD;  Location: [facility];  Service: Endoscopy;  Laterality: N/A;  . Esophageal dilation N/A 11/20/2014    Procedure: ESOPHAGEAL DILATION [place];  Surgeon: Rogene Houston, MD;  Location: [facility];  Service: Endoscopy;  Laterality: N/A;  . Esophageal biopsy N/A 11/20/2014    Procedure: BIOPSY;  Surgeon: Rogene Houston, MD;  Location: [facility];  Service: Endoscopy;  Laterality: N/A;    Family History  Problem Relation Age of Onset  . Emphysema Sister   . Kidney disease Sister   . Diabetes Mother   . Diabetes Father   . Arthritis    . Kidney disease    . Diabetes Brother     Social History Social History  Substance Use Topics  . Smoking status: Former Smoker -- 1.00 packs/day for 42 years    Types: Cigarettes    Quit date: 06/04/2010  . Smokeless tobacco: Never Used  . Alcohol Use: No    Allergies  Allergen Reactions  . Prednisone     ONLY  THE CORTISONE INJECTIONS. Causes body to burn, like blood is burning body.   . Acetaminophen Other (See Comments)    Muscle Cramps.   . Other     Steroids - Makes body feel HOT  . Sulfur Hives  . Warfarin Sodium     REACTION: pt devloped lrge hematoma onleg while on coumadin    Current Outpatient Prescriptions  Medication Sig Dispense Refill  . amLODipine (NORVASC) 2.5 MG tablet TAKE 1 TABLET BY MOUTH ONCE DAILY. 30 tablet 3  . clobetasol cream (TEMOVATE) [id number] % Apply 1 application topically daily as needed. rash    . diphenhydrAMINE (BENADRYL) 25 mg capsule Take 25 mg by mouth every 8 (eight) hours as needed for allergies.    Marland Kitchen gabapentin (NEURONTIN) 300 MG capsule TAKE (1) CAPSULE BY MOUTH TWICE DAILY.(ONE AT BEDTIME) 60 capsule 3  . methadone (DOLOPHINE) 10 MG tablet Take 1 tablet (10 mg total) by mouth every 8 (eight) hours. 90 tablet 0   . methocarbamol (ROBAXIN) 500 MG tablet Take 1 tablet (500 mg total) by mouth every 8 (eight) hours as needed for muscle spasms. 90 tablet 4  . NEXIUM 40 MG capsule One capsule once daily 90 capsule 1  . polyethylene glycol powder (GLYCOLAX/MIRALAX) powder MIX 1 CAPFUL (17 GRAMS) WITH 8OZ OF WATER OR JUICE DAILY. 527 g 5  . predniSONE (DELTASONE) 10 MG tablet Take 1 tablet (10 mg total) by mouth daily. 28 tablet 1  . promethazine (PHENERGAN) 25 MG tablet TAKE 1 TABLET BY MOUTH EVERY 6 HOURS AS NEEDED. 30 tablet 3  . rosuvastatin (CRESTOR) 40 MG tablet Take 1 tablet (40 mg total) by mouth daily. 90 tablet 1  . SYNTHROID 200 MCG tablet TAKE ONE AND A HALF TABLET BY MOUTH ON MON, WED, AND FRI THEN ONE TABLET ALL OTHER DAYS. 35 tablet 2  . traZODone (DESYREL) 100 MG tablet TAKE 1 TABLET BY MOUTH AT BEDTIME. 30 tablet 4  . Vitamin D, Ergocalciferol, (DRISDOL) 50000 UNITS CAPS capsule TAKE 1 CAPSULE BY MOUTH ONCE A WEEK. 4 capsule 4   No current facility-administered medications for this visit.       Physical Exam Physical Exam Blood pressure 142/66, height 5\' 4"  (1.626 m), weight 208 lb 1.9 oz (94.403 kg). Normal appearance grooming hygiene oriented 3 mood normal gait unremarkable. Right wrist tender over the first extensor compartment wrist joint she has painful ulnar deviation wrist flexion she has tenderness of the wrist joint volar and dorsal. Rotation normal stability normal motor exam normal good crypt normal skin normal sensation normal capillary refill lymph nodes negative positive grind test. 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  . Right wrist pain Yes  . Primary osteoarthritis of right wrist   . De Quervain's syndrome (tenosynovitis)   . Primary osteoarthritis of first carpometacarpal joint of right hand         Plan    Splint x 6 weeks  Meds  ordered this encounter  Medications  . predniSONE (DELTASONE) 10 MG tablet    Sig: Take 1 tablet (10 mg total) by mouth daily.    Dispense:  28 tablet    Refill:  1           Arther Abbott 03/22/2015, 2:46 PM

## 2015-03-22 NOTE — Patient Instructions (Addendum)
Wear brace 6 more weeks  Medication sent to your pharmacy

## 2015-03-26 ENCOUNTER — Other Ambulatory Visit: Payer: Self-pay

## 2015-03-26 DIAGNOSIS — M5489 Other dorsalgia: Secondary | ICD-10-CM

## 2015-03-26 MED ORDER — METHADONE HCL 10 MG PO TABS: 10.0000 mg | ORAL_TABLET | Freq: Three times a day (TID) | ORAL | Status: DC

## 2015-04-13 ENCOUNTER — Other Ambulatory Visit: Payer: Self-pay

## 2015-04-13 DIAGNOSIS — K219 Gastro-esophageal reflux disease without esophagitis: Secondary | ICD-10-CM

## 2015-04-13 DIAGNOSIS — K222 Esophageal obstruction: Principal | ICD-10-CM

## 2015-04-13 MED ORDER — ROSUVASTATIN CALCIUM 40 MG PO TABS: 40.0000 mg | ORAL_TABLET | Freq: Every day | ORAL | Status: DC

## 2015-04-13 MED ORDER — NEXIUM 40 MG PO CPDR: DELAYED_RELEASE_CAPSULE | ORAL | Status: DC

## 2015-04-21 ENCOUNTER — Other Ambulatory Visit: Payer: Self-pay | Admitting: Family Medicine

## 2015-05-04 ENCOUNTER — Ambulatory Visit (INDEPENDENT_AMBULATORY_CARE_PROVIDER_SITE_OTHER): Payer: Medicare Other | Admitting: Orthopedic Surgery

## 2015-05-04 VITALS — BP 147/62 | Ht 64.0 in | Wt 208.1 lb

## 2015-05-04 DIAGNOSIS — M25531 Pain in right wrist: Secondary | ICD-10-CM

## 2015-05-04 DIAGNOSIS — M654 Radial styloid tenosynovitis [de Quervain]: Secondary | ICD-10-CM

## 2015-05-04 DIAGNOSIS — M1811 Unilateral primary osteoarthritis of first carpometacarpal joint, right hand: Secondary | ICD-10-CM | POA: Diagnosis not present

## 2015-05-04 DIAGNOSIS — M19031 Primary osteoarthritis, right wrist: Secondary | ICD-10-CM | POA: Diagnosis not present

## 2015-05-04 MED ORDER — PREDNISONE 10 MG PO TABS: 10.0000 mg | ORAL_TABLET | Freq: Every day | ORAL | Status: DC

## 2015-05-04 NOTE — Progress Notes (Signed)
Patient ID: Katelyn Rowland, female   DOB: 05/05/1941, 74 y.o.   MRN: [id number]  Follow up visit  Chief Complaint  Patient presents with  . Follow-up    6 week follow up right hand/wrist s/p brace + medication    BP 147/62 mmHg  Ht 5\' 4"  (1.626 m)  Wt 208 lb 1.9 oz (94.403 kg)  BMI 35.71 kg/m2  No diagnosis found.  Patient was treated for arthritis of the wrist and de Quervain's syndrome and CMC joint arthritis right hand. Bracing and prednisone symptoms have been relieved  Review of systems left wrist pain numbness tingling possible carpal tunnel  Exam vital signs stable right wrist tenderness has diminished significantly still has some de Quervain's tenderness and CMC tenderness range of motion has improved grip strength has improved wrist joint remains stable neurovascular exam right hand intact  Spoke to patient regarding risk of prednisone as she wanted another prescription. She is agreeable to continuing the prednisone 10 mg a day for the first 4 months and then decreasing to 5 mg she understands the risks associated with prednisone use.  Follow-up as needed  Meds ordered this encounter  Medications  . predniSONE (DELTASONE) 10 MG tablet    Sig: Take 1 tablet (10 mg total) by mouth daily.    Dispense:  60 tablet    Refill:  1

## 2015-05-07 ENCOUNTER — Other Ambulatory Visit: Payer: Self-pay

## 2015-05-07 DIAGNOSIS — M5489 Other dorsalgia: Secondary | ICD-10-CM

## 2015-05-07 MED ORDER — METHADONE HCL 10 MG PO TABS: 10.0000 mg | ORAL_TABLET | Freq: Three times a day (TID) | ORAL | Status: DC

## 2015-06-09 DIAGNOSIS — Z79899 Other long term (current) drug therapy: Secondary | ICD-10-CM | POA: Diagnosis not present

## 2015-06-09 DIAGNOSIS — D631 Anemia in chronic kidney disease: Secondary | ICD-10-CM | POA: Diagnosis not present

## 2015-06-09 DIAGNOSIS — D509 Iron deficiency anemia, unspecified: Secondary | ICD-10-CM | POA: Diagnosis not present

## 2015-06-09 DIAGNOSIS — I10 Essential (primary) hypertension: Secondary | ICD-10-CM | POA: Diagnosis not present

## 2015-06-09 DIAGNOSIS — N183 Chronic kidney disease, stage 3 (moderate): Secondary | ICD-10-CM | POA: Diagnosis not present

## 2015-06-09 DIAGNOSIS — E559 Vitamin D deficiency, unspecified: Secondary | ICD-10-CM | POA: Diagnosis not present

## 2015-06-09 DIAGNOSIS — R809 Proteinuria, unspecified: Secondary | ICD-10-CM | POA: Diagnosis not present

## 2015-06-11 ENCOUNTER — Other Ambulatory Visit: Payer: Self-pay

## 2015-06-11 DIAGNOSIS — M5489 Other dorsalgia: Secondary | ICD-10-CM

## 2015-06-11 MED ORDER — METHADONE HCL 10 MG PO TABS: 10.0000 mg | ORAL_TABLET | Freq: Three times a day (TID) | ORAL | Status: DC

## 2015-06-15 DIAGNOSIS — I10 Essential (primary) hypertension: Secondary | ICD-10-CM | POA: Diagnosis not present

## 2015-06-15 DIAGNOSIS — N184 Chronic kidney disease, stage 4 (severe): Secondary | ICD-10-CM | POA: Diagnosis not present

## 2015-06-15 DIAGNOSIS — N2581 Secondary hyperparathyroidism of renal origin: Secondary | ICD-10-CM | POA: Diagnosis not present

## 2015-06-15 DIAGNOSIS — R809 Proteinuria, unspecified: Secondary | ICD-10-CM | POA: Diagnosis not present

## 2015-06-22 ENCOUNTER — Other Ambulatory Visit: Payer: Self-pay | Admitting: Family Medicine

## 2015-06-29 ENCOUNTER — Other Ambulatory Visit: Payer: Self-pay | Admitting: Family Medicine

## 2015-06-30 ENCOUNTER — Telehealth: Payer: Self-pay

## 2015-06-30 DIAGNOSIS — I10 Essential (primary) hypertension: Secondary | ICD-10-CM

## 2015-06-30 DIAGNOSIS — R7302 Impaired glucose tolerance (oral): Secondary | ICD-10-CM

## 2015-06-30 DIAGNOSIS — E038 Other specified hypothyroidism: Secondary | ICD-10-CM

## 2015-06-30 DIAGNOSIS — E785 Hyperlipidemia, unspecified: Secondary | ICD-10-CM

## 2015-06-30 NOTE — Telephone Encounter (Signed)
Labs ordered and faxed to [facility] on [address].

## 2015-07-06 ENCOUNTER — Ambulatory Visit (INDEPENDENT_AMBULATORY_CARE_PROVIDER_SITE_OTHER): Payer: Medicare Other | Admitting: Family Medicine

## 2015-07-06 ENCOUNTER — Encounter: Payer: Self-pay | Admitting: Family Medicine

## 2015-07-06 VITALS — BP 128/62 | HR 74 | Resp 18 | Ht 64.0 in | Wt 200.1 lb

## 2015-07-06 DIAGNOSIS — R7302 Impaired glucose tolerance (oral): Secondary | ICD-10-CM

## 2015-07-06 DIAGNOSIS — I10 Essential (primary) hypertension: Secondary | ICD-10-CM | POA: Diagnosis not present

## 2015-07-06 DIAGNOSIS — M159 Polyosteoarthritis, unspecified: Secondary | ICD-10-CM | POA: Diagnosis not present

## 2015-07-06 DIAGNOSIS — E038 Other specified hypothyroidism: Secondary | ICD-10-CM

## 2015-07-06 DIAGNOSIS — W19XXXS Unspecified fall, sequela: Secondary | ICD-10-CM

## 2015-07-06 DIAGNOSIS — E119 Type 2 diabetes mellitus without complications: Secondary | ICD-10-CM | POA: Diagnosis not present

## 2015-07-06 DIAGNOSIS — M5440 Lumbago with sciatica, unspecified side: Secondary | ICD-10-CM

## 2015-07-06 DIAGNOSIS — G4700 Insomnia, unspecified: Secondary | ICD-10-CM

## 2015-07-06 DIAGNOSIS — R2681 Unsteadiness on feet: Secondary | ICD-10-CM

## 2015-07-06 DIAGNOSIS — N184 Chronic kidney disease, stage 4 (severe): Secondary | ICD-10-CM

## 2015-07-06 DIAGNOSIS — E559 Vitamin D deficiency, unspecified: Secondary | ICD-10-CM

## 2015-07-06 MED ORDER — METHOCARBAMOL 500 MG PO TABS: ORAL_TABLET | ORAL | Status: DC

## 2015-07-06 NOTE — Progress Notes (Signed)
Subjective:    Patient ID: Katelyn Rowland, female    DOB: [birth year], 74 y.o.   MRN: [id number]  HPI   SKYLIN Rowland     MRN: [id number]      DOB: Oct 21, 1940   HPI [name] is here for follow up and re-evaluation of chronic medical conditions, medication management and review of any available recent lab and radiology data.  Preventive health is updated, specifically  Cancer screening and Immunization.  Refuses almost all, certainly the flu vaccine Recently saw nephrology and was advised that her renal function is improved , which is great news. The PT denies any adverse reactions to current medications since the last visit.  Recent fall with trauma to right foot home safety reviewed at visit ROS Denies recent fever or chills. Denies sinus pressure, nasal congestion, ear pain or sore throat. Denies chest congestion, productive cough or wheezing. Denies chest pains, palpitations and leg swelling Denies abdominal pain, nausea, vomiting,diarrhea or constipation.   Denies dysuria, frequency, hesitancy or incontinence Denies headaches, seizures, numbness, or tingling. Denies depression, anxiety or uncontrolled  insomnia. Denies skin break down or rash.   PE  BP 128/62 mmHg  Pulse 74  Resp 18  Ht 5\' 4"  (1.626 m)  Wt 200 lb 1.9 oz (90.774 kg)  BMI 34.33 kg/m2  SpO2 92%  Patient alert and oriented and in no cardiopulmonary distress.  HEENT: No facial asymmetry, EOMI,   oropharynx pink and moist.  Neck supple no JVD, no mass.  Chest: Clear to auscultation bilaterally.  CVS: S1, S2 no murmurs, no S3.Regular rate.  ABD: Soft non tender.   Ext: No edema  MS: decreased  ROM spine, shoulders, hips and knees.Bruising, and tenderness of right foot in area of recent trauma, able to weight bear   Skin: Intact, no ulcerations or rash noted.  Psych: Good eye contact, normal affect. Memory intact not anxious or depressed appearing.  CNS: CN 2-12 intact, power,  normal  throughout.no focal deficits noted.   Assessment & Plan   Essential hypertension Controlled, no change in medication DASH diet and commitment to daily physical activity for a minimum of 30 minutes discussed and encouraged, as a part of hypertension management. The importance of attaining a healthy weight is also discussed.  BP/Weight 07/06/2015 05/04/2015 03/22/2015 02/24/2015 02/04/2015 11/23/2014 0000000  Systolic BP 0000000 Q000111Q [id number] Q000111Q Q000111Q  Diastolic BP 62 62 66 78 53 82 70  Wt. (Lbs) 200.12 208.12 208.12 208.12 197 211 -  BMI 34.33 35.71 35.71 35.71 33.8 37.39 -        Hypothyroidism Most recent lab showed lack of control, med adjustment made with follow up lab ordered  IGT (impaired glucose tolerance) Patient educated about the importance of limiting  Carbohydrate intake , the need to commit to daily physical activity for a minimum of 30 minutes , and to commit weight loss. The fact that changes in all these areas will reduce or eliminate all together the development of diabetes is stressed.  Updated lab needed at/ before next visit.   Diabetic Labs Latest Ref Rng 07/06/2015 11/18/2014 10/13/2014 08/25/2014 07/27/2014  HbA1c <5.7 % - - 6.9(H) - -  Microalbumin Not estab mg/dL 76.2 - - - -  Micro/Creat Ratio <30 mcg/mg creat 1859(H) - - - -  Chol 0 - 200 mg/dL - - - - -  HDL >39 mg/dL - - - - -  Calc LDL 0 - 99 mg/dL - - - - -  Triglycerides <150 mg/dL - - - - -  Creatinine 0.50 - 1.10 mg/dL - 1.84(H) 1.72(H) 2.11(H) 1.90(H)   BP/Weight 07/06/2015 05/04/2015 03/22/2015 02/24/2015 02/04/2015 11/23/2014 0000000  Systolic BP 0000000 Q000111Q [id number] Q000111Q Q000111Q  Diastolic BP 62 62 66 78 53 82 70  Wt. (Lbs) 200.12 208.12 208.12 208.12 197 211 -  BMI 34.33 35.71 35.71 35.71 33.8 37.39 -   Foot/eye exam completion dates 06/07/2010  Foot exam Order yes  Foot Form Completion -       CKD (chronic kidney disease) stage 4, GFR 15-29 ml/min Stable and followed by nephrology  Gait  instability High fall risk with h/o recent fall, reviewed safety issues at visit  Morbid obesity Improved. Patient re-educated about  the importance of commitment to a  minimum of 150 minutes of exercise per week.  The importance of healthy food choices with portion control discussed. Encouraged to start a food diary, count calories and to consider  joining a support group. Sample diet sheets offered. Goals set by the patient for the next several months.   Weight /BMI 07/06/2015 05/04/2015 03/22/2015  WEIGHT 200 lb 1.9 oz 208 lb 1.9 oz 208 lb 1.9 oz  HEIGHT 5\' 4"  5\' 4"  5\' 4"   BMI 34.33 kg/m2 35.71 kg/m2 35.71 kg/m2    Current exercise per week 30 minutes.Limited due to mobility problems   Back pain Improved level of function on chronic steroid, pt aware of potential adverse s/e however, benefit to quality of life outweighs the risk  INSOMNIA, CHRONIC Sleep hygiene reviewed and written information offered also. Prescription sent for  medication needed.   Unspecified fall, sequela C/o discomfort and bruising to affected limb, no interest in imaging as able to weight bear,home safety reviewed       Review of Systems     Objective:   Physical Exam        Assessment & Plan:

## 2015-07-06 NOTE — Patient Instructions (Signed)
F/u in 4.5 month, with MMSE, call if you need me before  GOOD lighting is essential to prevent falls, also start to declutter  Fasting  Labs at [facility] within the week  No med change  We will get  Most recent note from kidney specialist  Microalb today  Thanks for choosing [facility], we consider it a privelige to serve you.     Fall Prevention in the Home  Falls can cause injuries. They can happen to people of all ages. There are many things you can do to make your home safe and to help prevent falls.  WHAT CAN I DO ON THE OUTSIDE OF MY HOME?  Regularly fix the edges of walkways and driveways and fix any cracks.  Remove anything that might make you trip as you walk through a door, such as a raised step or threshold.  Trim any bushes or trees on the path to your home.  Use bright outdoor lighting.  Clear any walking paths of anything that might make someone trip, such as rocks or tools.  Regularly check to see if handrails are loose or broken. Make sure that both sides of any steps have handrails.  Any raised decks and porches should have guardrails on the edges.  Have any leaves, snow, or ice cleared regularly.  Use sand or salt on walking paths during winter.  Clean up any spills in your garage right away. This includes oil or grease spills. WHAT CAN I DO IN THE BATHROOM?   Use night lights.  Install grab bars by the toilet and in the tub and shower. Do not use towel bars as grab bars.  Use non-skid mats or decals in the tub or shower.  If you need to sit down in the shower, use a plastic, non-slip stool.  Keep the floor dry. Clean up any water that spills on the floor as soon as it happens.  Remove soap buildup in the tub or shower regularly.  Attach bath mats securely with double-sided non-slip rug tape.  Do not have throw rugs and other things on the floor that can make you trip. WHAT CAN I DO IN THE BEDROOM?  Use night lights.  Make sure  that you have a light by your bed that is easy to reach.  Do not use any sheets or blankets that are too big for your bed. They should not hang down onto the floor.  Have a firm chair that has side arms. You can use this for support while you get dressed.  Do not have throw rugs and other things on the floor that can make you trip. WHAT CAN I DO IN THE KITCHEN?  Clean up any spills right away.  Avoid walking on wet floors.  Keep items that you use a lot in easy-to-reach places.  If you need to reach something above you, use a strong step stool that has a grab bar.  Keep electrical cords out of the way.  Do not use floor polish or wax that makes floors slippery. If you must use wax, use non-skid floor wax.  Do not have throw rugs and other things on the floor that can make you trip. WHAT CAN I DO WITH MY STAIRS?  Do not leave any items on the stairs.  Make sure that there are handrails on both sides of the stairs and use them. Fix handrails that are broken or loose. Make sure that handrails are as long as the stairways.  Check any carpeting to make sure that it is firmly attached to the stairs. Fix any carpet that is loose or worn.  Avoid having throw rugs at the top or bottom of the stairs. If you do have throw rugs, attach them to the floor with carpet tape.  Make sure that you have a light switch at the top of the stairs and the bottom of the stairs. If you do not have them, ask someone to add them for you. WHAT ELSE CAN I DO TO HELP PREVENT FALLS?  Wear shoes that:  Do not have high heels.  Have rubber bottoms.  Are comfortable and fit you well.  Are closed at the toe. Do not wear sandals.  If you use a stepladder:  Make sure that it is fully opened. Do not climb a closed stepladder.  Make sure that both sides of the stepladder are locked into place.  Ask someone to hold it for you, if possible.  Clearly mark and make sure that you can see:  Any grab bars or  handrails.  First and last steps.  Where the edge of each step is.  Use tools that help you move around (mobility aids) if they are needed. These include:  Canes.  Walkers.  Scooters.  Crutches.  Turn on the lights when you go into a dark area. Replace any light bulbs as soon as they burn out.  Set up your furniture so you have a clear path. Avoid moving your furniture around.  If any of your floors are uneven, fix them.  If there are any pets around you, be aware of where they are.  Review your medicines with your doctor. Some medicines can make you feel dizzy. This can increase your chance of falling. Ask your doctor what other things that you can do to help prevent falls.   This information is not intended to replace advice given to you by your health care provider. Make sure you discuss any questions you have with your health care provider.   Document Released: 05/20/2009 Document Revised: 12/08/2014 Document Reviewed: 08/28/2014 Elsevier Interactive Patient Education [employer].

## 2015-07-07 LAB — MICROALBUMIN / CREATININE URINE RATIO
Creatinine, Urine: 41 mg/dL (ref 20–320)
MICROALB/CREAT RATIO: 1859 ug/mg{creat} — AB (ref <30)
Microalb, Ur: 76.2 mg/dL

## 2015-07-09 ENCOUNTER — Other Ambulatory Visit: Payer: Self-pay

## 2015-07-09 DIAGNOSIS — M5489 Other dorsalgia: Secondary | ICD-10-CM

## 2015-07-09 MED ORDER — METHADONE HCL 10 MG PO TABS: 10.0000 mg | ORAL_TABLET | Freq: Three times a day (TID) | ORAL | Status: DC

## 2015-07-13 ENCOUNTER — Other Ambulatory Visit: Payer: Self-pay | Admitting: Family Medicine

## 2015-07-19 ENCOUNTER — Telehealth: Payer: Self-pay | Admitting: Family Medicine

## 2015-07-19 DIAGNOSIS — E038 Other specified hypothyroidism: Secondary | ICD-10-CM | POA: Diagnosis not present

## 2015-07-19 DIAGNOSIS — E785 Hyperlipidemia, unspecified: Secondary | ICD-10-CM | POA: Diagnosis not present

## 2015-07-19 DIAGNOSIS — R7302 Impaired glucose tolerance (oral): Secondary | ICD-10-CM | POA: Diagnosis not present

## 2015-07-19 DIAGNOSIS — I10 Essential (primary) hypertension: Secondary | ICD-10-CM | POA: Diagnosis not present

## 2015-07-19 DIAGNOSIS — E559 Vitamin D deficiency, unspecified: Secondary | ICD-10-CM | POA: Diagnosis not present

## 2015-07-19 MED ORDER — ROSUVASTATIN CALCIUM 40 MG PO TABS: 40.0000 mg | ORAL_TABLET | Freq: Every day | ORAL | Status: DC

## 2015-07-19 NOTE — Telephone Encounter (Signed)
Patient is asking for a returned call from one of the nurses

## 2015-07-19 NOTE — Telephone Encounter (Signed)
Med refilled.

## 2015-07-23 ENCOUNTER — Other Ambulatory Visit: Payer: Self-pay

## 2015-07-23 MED ORDER — PROMETHAZINE HCL 25 MG PO TABS: 25.0000 mg | ORAL_TABLET | Freq: Four times a day (QID) | ORAL | Status: DC | PRN

## 2015-07-26 ENCOUNTER — Telehealth: Payer: Self-pay | Admitting: Family Medicine

## 2015-07-26 DIAGNOSIS — E038 Other specified hypothyroidism: Secondary | ICD-10-CM

## 2015-07-26 MED ORDER — LEVOTHYROXINE SODIUM 150 MCG PO TABS: 150.0000 ug | ORAL_TABLET | Freq: Every day | ORAL | Status: DC

## 2015-07-26 NOTE — Telephone Encounter (Signed)
Needs to change to synthroid 150 mcg one dasily rept in 6 weeks, current tSh low at .064 pls let her know

## 2015-07-26 NOTE — Addendum Note (Signed)
Addended by: Eual Fines on: 07/26/2015 02:11 PM   Modules accepted: Orders

## 2015-07-26 NOTE — Telephone Encounter (Signed)
Patient aware and mailed lab order for feb

## 2015-07-26 NOTE — Telephone Encounter (Signed)
Patient aware.

## 2015-07-28 ENCOUNTER — Other Ambulatory Visit: Payer: Self-pay | Admitting: Family Medicine

## 2015-08-05 ENCOUNTER — Other Ambulatory Visit: Payer: Self-pay

## 2015-08-05 ENCOUNTER — Telehealth: Payer: Self-pay | Admitting: Family Medicine

## 2015-08-05 NOTE — Telephone Encounter (Signed)
Katelyn Rowland is requesting a returned phone call regarding medications

## 2015-08-05 NOTE — Telephone Encounter (Signed)
Spoke with patient and she states that she get most of her medications at [facility] and only has 2 that she receives from [facility].  A medication was mistakenly sent to the the wrong pharmacy.  Advised patient that [facility] will be removed from her chart and when she needs those 2 medications she will have to call office and specify that she needs it from that pharmacy.

## 2015-08-05 NOTE — Telephone Encounter (Signed)
Patient is asking for a returned phone call regarding her medications

## 2015-08-09 DIAGNOSIS — W19XXXS Unspecified fall, sequela: Secondary | ICD-10-CM | POA: Insufficient documentation

## 2015-08-09 NOTE — Assessment & Plan Note (Signed)
High fall risk with h/o recent fall, reviewed safety issues at visit

## 2015-08-09 NOTE — Assessment & Plan Note (Signed)
Improved level of function on chronic steroid, pt aware of potential adverse s/e however, benefit to quality of life outweighs the risk

## 2015-08-09 NOTE — Assessment & Plan Note (Signed)
C/o discomfort and bruising to affected limb, no interest in imaging as able to weight bear,home safety reviewed

## 2015-08-09 NOTE — Assessment & Plan Note (Addendum)
Improved. Patient re-educated about  the importance of commitment to a  minimum of 150 minutes of exercise per week.  The importance of healthy food choices with portion control discussed. Encouraged to start a food diary, count calories and to consider  joining a support group. Sample diet sheets offered. Goals set by the patient for the next several months.   Weight /BMI 07/06/2015 05/04/2015 03/22/2015  WEIGHT 200 lb 1.9 oz 208 lb 1.9 oz 208 lb 1.9 oz  HEIGHT 5\' 4"  5\' 4"  5\' 4"   BMI 34.33 kg/m2 35.71 kg/m2 35.71 kg/m2    Current exercise per week 30 minutes.Limited due to mobility problems

## 2015-08-09 NOTE — Assessment & Plan Note (Signed)
Stable and followed by nephrology 

## 2015-08-09 NOTE — Assessment & Plan Note (Addendum)
Most recent lab showed lack of control, med adjustment made with follow up lab ordered

## 2015-08-09 NOTE — Assessment & Plan Note (Signed)
Patient educated about the importance of limiting  Carbohydrate intake , the need to commit to daily physical activity for a minimum of 30 minutes , and to commit weight loss. The fact that changes in all these areas will reduce or eliminate all together the development of diabetes is stressed.  Updated lab needed at/ before next visit.   Diabetic Labs Latest Ref Rng 07/06/2015 11/18/2014 10/13/2014 08/25/2014 07/27/2014  HbA1c <5.7 % - - 6.9(H) - -  Microalbumin Not estab mg/dL 76.2 - - - -  Micro/Creat Ratio <30 mcg/mg creat 1859(H) - - - -  Chol 0 - 200 mg/dL - - - - -  HDL >39 mg/dL - - - - -  Calc LDL 0 - 99 mg/dL - - - - -  Triglycerides <150 mg/dL - - - - -  Creatinine 0.50 - 1.10 mg/dL - 1.84(H) 1.72(H) 2.11(H) 1.90(H)   BP/Weight 07/06/2015 05/04/2015 03/22/2015 02/24/2015 02/04/2015 11/23/2014 0000000  Systolic BP 0000000 Q000111Q A999333 [id number] Q000111Q Q000111Q  Diastolic BP 62 62 66 78 53 82 70  Wt. (Lbs) 200.12 208.12 208.12 208.12 197 211 -  BMI 34.33 35.71 35.71 35.71 33.8 37.39 -   Foot/eye exam completion dates 06/07/2010  Foot exam Order yes  Foot Form Completion -

## 2015-08-09 NOTE — Assessment & Plan Note (Signed)
Sleep hygiene reviewed and written information offered also. Prescription sent for  medication needed.  

## 2015-08-09 NOTE — Assessment & Plan Note (Signed)
Controlled, no change in medication DASH diet and commitment to daily physical activity for a minimum of 30 minutes discussed and encouraged, as a part of hypertension management. The importance of attaining a healthy weight is also discussed.  BP/Weight 07/06/2015 05/04/2015 03/22/2015 02/24/2015 02/04/2015 11/23/2014 0000000  Systolic BP 0000000 Q000111Q A999333 [id number] Q000111Q Q000111Q  Diastolic BP 62 62 66 78 53 82 70  Wt. (Lbs) 200.12 208.12 208.12 208.12 197 211 -  BMI 34.33 35.71 35.71 35.71 33.8 37.39 -

## 2015-08-13 ENCOUNTER — Other Ambulatory Visit: Payer: Self-pay

## 2015-08-13 DIAGNOSIS — M5489 Other dorsalgia: Secondary | ICD-10-CM

## 2015-08-13 MED ORDER — METHADONE HCL 10 MG PO TABS: 10.0000 mg | ORAL_TABLET | Freq: Three times a day (TID) | ORAL | Status: DC

## 2015-08-17 ENCOUNTER — Other Ambulatory Visit: Payer: Self-pay | Admitting: *Deleted

## 2015-08-17 MED ORDER — PREDNISONE 10 MG PO TABS: 10.0000 mg | ORAL_TABLET | Freq: Every day | ORAL | Status: DC

## 2015-08-25 ENCOUNTER — Other Ambulatory Visit: Payer: Self-pay | Admitting: Family Medicine

## 2015-09-09 ENCOUNTER — Other Ambulatory Visit: Payer: Self-pay

## 2015-09-09 DIAGNOSIS — M5489 Other dorsalgia: Secondary | ICD-10-CM

## 2015-09-09 MED ORDER — METHADONE HCL 10 MG PO TABS: 10.0000 mg | ORAL_TABLET | Freq: Three times a day (TID) | ORAL | Status: DC

## 2015-09-13 ENCOUNTER — Telehealth: Payer: Self-pay | Admitting: Family Medicine

## 2015-09-13 NOTE — Telephone Encounter (Signed)
Patient aware that labs do not have to be fasting.  She will call Dr. Ruthe Mannan office re: Prednisone

## 2015-09-13 NOTE — Telephone Encounter (Signed)
Patient has questions about lab work, please advise?

## 2015-09-16 ENCOUNTER — Other Ambulatory Visit: Payer: Self-pay | Admitting: Family Medicine

## 2015-09-17 ENCOUNTER — Other Ambulatory Visit: Payer: Self-pay

## 2015-09-17 MED ORDER — GABAPENTIN 300 MG PO CAPS: ORAL_CAPSULE | ORAL | Status: DC

## 2015-09-22 ENCOUNTER — Other Ambulatory Visit: Payer: Self-pay

## 2015-09-22 DIAGNOSIS — I10 Essential (primary) hypertension: Secondary | ICD-10-CM | POA: Diagnosis not present

## 2015-09-22 DIAGNOSIS — N183 Chronic kidney disease, stage 3 (moderate): Secondary | ICD-10-CM | POA: Diagnosis not present

## 2015-09-22 DIAGNOSIS — Z79899 Other long term (current) drug therapy: Secondary | ICD-10-CM | POA: Diagnosis not present

## 2015-09-22 DIAGNOSIS — E559 Vitamin D deficiency, unspecified: Secondary | ICD-10-CM | POA: Diagnosis not present

## 2015-09-22 DIAGNOSIS — D509 Iron deficiency anemia, unspecified: Secondary | ICD-10-CM | POA: Diagnosis not present

## 2015-09-22 DIAGNOSIS — E038 Other specified hypothyroidism: Secondary | ICD-10-CM | POA: Diagnosis not present

## 2015-09-22 DIAGNOSIS — R809 Proteinuria, unspecified: Secondary | ICD-10-CM | POA: Diagnosis not present

## 2015-09-22 MED ORDER — AMLODIPINE BESYLATE 2.5 MG PO TABS: 2.5000 mg | ORAL_TABLET | Freq: Every day | ORAL | Status: DC

## 2015-09-28 ENCOUNTER — Telehealth: Payer: Self-pay | Admitting: Family Medicine

## 2015-09-28 DIAGNOSIS — I10 Essential (primary) hypertension: Secondary | ICD-10-CM

## 2015-09-28 DIAGNOSIS — R7302 Impaired glucose tolerance (oral): Secondary | ICD-10-CM

## 2015-09-28 DIAGNOSIS — E038 Other specified hypothyroidism: Secondary | ICD-10-CM

## 2015-09-28 DIAGNOSIS — E785 Hyperlipidemia, unspecified: Secondary | ICD-10-CM

## 2015-09-28 MED ORDER — LEVOTHYROXINE SODIUM 125 MCG PO TABS: 125.0000 ug | ORAL_TABLET | Freq: Every day | ORAL | Status: DC

## 2015-09-28 NOTE — Telephone Encounter (Signed)
Pt aware and med sent and lab orders mailed

## 2015-09-28 NOTE — Addendum Note (Signed)
Addended by: Eual Fines on: 09/28/2015 01:54 PM   Modules accepted: Orders

## 2015-09-28 NOTE — Telephone Encounter (Signed)
Pls let her know thyroid gland is over corrected, reduce dose to 125 mcg once daily, (currently on 150 mcg daily) I have printed new script, pls also notify pharmacy  Needs cmp and EGFR, HBA1C  And TSH, all non fasting for her April visit, get in April 1 week before if possible pls

## 2015-09-29 DIAGNOSIS — N250 Renal osteodystrophy: Secondary | ICD-10-CM | POA: Diagnosis not present

## 2015-09-29 DIAGNOSIS — N184 Chronic kidney disease, stage 4 (severe): Secondary | ICD-10-CM | POA: Diagnosis not present

## 2015-09-29 DIAGNOSIS — I10 Essential (primary) hypertension: Secondary | ICD-10-CM | POA: Diagnosis not present

## 2015-09-29 DIAGNOSIS — R809 Proteinuria, unspecified: Secondary | ICD-10-CM | POA: Diagnosis not present

## 2015-10-04 ENCOUNTER — Encounter: Payer: Self-pay | Admitting: Family Medicine

## 2015-10-14 ENCOUNTER — Other Ambulatory Visit: Payer: Self-pay

## 2015-10-14 DIAGNOSIS — M5489 Other dorsalgia: Secondary | ICD-10-CM

## 2015-10-14 MED ORDER — METHADONE HCL 10 MG PO TABS: 10.0000 mg | ORAL_TABLET | Freq: Three times a day (TID) | ORAL | Status: DC

## 2015-10-22 DIAGNOSIS — Z79899 Other long term (current) drug therapy: Secondary | ICD-10-CM | POA: Diagnosis not present

## 2015-10-22 DIAGNOSIS — I10 Essential (primary) hypertension: Secondary | ICD-10-CM | POA: Diagnosis not present

## 2015-11-12 ENCOUNTER — Other Ambulatory Visit: Payer: Self-pay

## 2015-11-12 DIAGNOSIS — M5489 Other dorsalgia: Secondary | ICD-10-CM

## 2015-11-12 MED ORDER — METHADONE HCL 10 MG PO TABS: 10.0000 mg | ORAL_TABLET | Freq: Three times a day (TID) | ORAL | Status: DC

## 2015-11-16 ENCOUNTER — Ambulatory Visit: Payer: Medicare Other | Admitting: Family Medicine

## 2015-11-29 ENCOUNTER — Other Ambulatory Visit: Payer: Self-pay

## 2015-11-29 DIAGNOSIS — M159 Polyosteoarthritis, unspecified: Secondary | ICD-10-CM

## 2015-11-29 MED ORDER — METHOCARBAMOL 500 MG PO TABS: ORAL_TABLET | ORAL | Status: DC

## 2015-12-10 ENCOUNTER — Encounter: Payer: Self-pay | Admitting: Family Medicine

## 2015-12-10 ENCOUNTER — Ambulatory Visit (INDEPENDENT_AMBULATORY_CARE_PROVIDER_SITE_OTHER): Payer: Medicare Other | Admitting: Family Medicine

## 2015-12-10 VITALS — BP 128/62 | HR 62 | Resp 18 | Ht 64.0 in | Wt 218.0 lb

## 2015-12-10 DIAGNOSIS — E785 Hyperlipidemia, unspecified: Secondary | ICD-10-CM

## 2015-12-10 DIAGNOSIS — R7302 Impaired glucose tolerance (oral): Secondary | ICD-10-CM | POA: Diagnosis not present

## 2015-12-10 DIAGNOSIS — M5441 Lumbago with sciatica, right side: Secondary | ICD-10-CM | POA: Diagnosis not present

## 2015-12-10 DIAGNOSIS — E559 Vitamin D deficiency, unspecified: Secondary | ICD-10-CM | POA: Diagnosis not present

## 2015-12-10 DIAGNOSIS — I10 Essential (primary) hypertension: Secondary | ICD-10-CM

## 2015-12-10 DIAGNOSIS — R0989 Other specified symptoms and signs involving the circulatory and respiratory systems: Secondary | ICD-10-CM

## 2015-12-10 DIAGNOSIS — R29898 Other symptoms and signs involving the musculoskeletal system: Secondary | ICD-10-CM

## 2015-12-10 DIAGNOSIS — Z9181 History of falling: Secondary | ICD-10-CM

## 2015-12-10 DIAGNOSIS — E038 Other specified hypothyroidism: Secondary | ICD-10-CM | POA: Diagnosis not present

## 2015-12-10 DIAGNOSIS — R296 Repeated falls: Secondary | ICD-10-CM

## 2015-12-10 DIAGNOSIS — R2681 Unsteadiness on feet: Secondary | ICD-10-CM

## 2015-12-10 LAB — LIPID PANEL: LDL Cholesterol: 36 mg/dL

## 2015-12-10 LAB — HEMOGLOBIN A1C: HEMOGLOBIN A1C: 7.3

## 2015-12-10 MED ORDER — DULOXETINE HCL 30 MG PO CPEP: 30.0000 mg | ORAL_CAPSULE | Freq: Every day | ORAL | Status: DC

## 2015-12-10 NOTE — Progress Notes (Signed)
   Subjective:    Patient ID: Katelyn Rowland, female    DOB: 1941/06/17, 75 y.o.   MRN: [id number]  HPI C/o increased difficulty  With safe ambulation with upper and lower extremity weakness, in the home over past 6 months, no falls in this year , requests PT, and a scooter. States after walking for a short distance bends over increasingly as though she will fall. C/o increased light headedness with change in position of her hed, denies inadequate hydration or food C/o increased back pain, meds not as effective MMSE today shows no memory loss score is 29  Review of Systems See HPI Denies recent fever or chills. Denies sinus pressure, nasal congestion, ear pain or sore throat. Denies chest congestion, productive cough or wheezing. Denies chest pains, palpitations and leg swelling Denies abdominal pain, nausea, vomiting,diarrhea or constipation.   Denies dysuria, frequency, hesitancy or incontinence.  Denies headaches, seizures, Denies depression, anxiety or insomnia. Denies skin break down or rash.        Objective:   Physical Exam  BP 128/62 mmHg  Pulse 62  Resp 18  Ht 5\' 4"  (1.626 m)  Wt 218 lb (98.884 kg)  BMI 37.40 kg/m2  SpO2 90% Patient alert and oriented and in no cardiopulmonary distress.  HEENT: No facial asymmetry, EOMI,   oropharynx pink and moist.  Neck decreased ROM no JVD, no mass.Bilateral bruits  Chest: Clear to auscultation bilaterally.  CVS: S1, S2 no murmurs, no S3.Regular rate.  ABD: Soft non tender.   Ext: No edema  MS: decreased ROM spine, shoulders, hips and knees.  Skin: Intact, no ulcerations or rash noted.  Psych: Good eye contact, normal affect. Memory intact not anxious or depressed appearing.  CNS: CN 2-12 intact, grade 4 power in upper and lower extremities.no focal deficits noted.       Assessment & Plan:  Gait instability Progressively worsening gait instability, reduced mobility and poor function in the home, requests  assessment for pWC, has both upper and lower extremity weakness  On exam and unsteady gait, refer for PT assessment for this , as well as in home PT twice  weekly for 5 weeks  Back pain Reports increased and uncontrolled, add cymbaltq  Essential hypertension Controlled, no change in medication   Hypothyroidism Updated lab needed   Hyperlipidemia LDL goal <100 Hyperlipidemia:Low fat diet discussed and encouraged.   Lipid Panel  Lab Results  Component Value Date   CHOL 119 06/05/2014   HDL 38* 06/05/2014   LDLCALC 53 06/05/2014   TRIG 138 06/05/2014   CHOLHDL 3.1 06/05/2014   Updated lab needed     Carotid bruit present Light headed and carotid bruit, needs doppler study, advised pt to also change position slowly

## 2015-12-10 NOTE — Patient Instructions (Addendum)
F/u in 3 month, call if you need me before  New for pain is cymbalta one daily  You are referred for [place] of arteries to brain since lightheaed  You are referred to  In home physical therapy to eval for power wheelchair, and also for in home pT twice weekly for 5 weeks due to unsteady gait and poor mobility  Labs today  Please work on good  health habits so that your health will improve. 1. Commitment to daily physical activity for 30 to 60  minutes, if you are able to do this.  2. Commitment to wise food choices. Aim for half of your  food intake to be vegetable and fruit, one quarter starchy foods, and one quarter protein. Try to eat on a regular schedule  3 meals per day, snacking between meals should be limited to vegetables or fruits or small portions of nuts. 64 ounces of water per day is generally recommended, unless you have specific health conditions, like heart failure or kidney failure where you will need to limit fluid intake.  3. Commitment to sufficient and a  good quality of physical and mental rest daily, generally between 6 to 8 hours per day.  WITH PERSISTANCE AND PERSEVERANCE, THE IMPOSSIBLE , BECOMES THE NORM!  Thanks for choosing [facility], we consider it a privelige to serve you.

## 2015-12-12 ENCOUNTER — Telehealth: Payer: Self-pay | Admitting: Family Medicine

## 2015-12-12 DIAGNOSIS — R0989 Other specified symptoms and signs involving the circulatory and respiratory systems: Secondary | ICD-10-CM | POA: Insufficient documentation

## 2015-12-12 NOTE — Assessment & Plan Note (Signed)
Reports increased and uncontrolled, add cymbaltq

## 2015-12-12 NOTE — Assessment & Plan Note (Signed)
Light headed and carotid bruit, needs doppler study, advised pt to also change position slowly

## 2015-12-12 NOTE — Assessment & Plan Note (Signed)
Progressively worsening gait instability, reduced mobility and poor function in the home, requests assessment for pWC, has both upper and lower extremity weakness  On exam and unsteady gait, refer for PT assessment for this , as well as in home PT twice  weekly for 5 weeks

## 2015-12-12 NOTE — Telephone Encounter (Signed)
Pls refer for in home PT twice weekly for 6 weeks, not is complete Also I entered referral for PT/OT to eval for power wheelchair/ scooter, pls let me know if order entered correctly, if not pls also re order , thanks

## 2015-12-12 NOTE — Assessment & Plan Note (Signed)
Updated lab needed.  

## 2015-12-12 NOTE — Assessment & Plan Note (Signed)
Hyperlipidemia:Low fat diet discussed and encouraged.   Lipid Panel  Lab Results  Component Value Date   CHOL 119 06/05/2014   HDL 38* 06/05/2014   LDLCALC 53 06/05/2014   TRIG 138 06/05/2014   CHOLHDL 3.1 06/05/2014   Updated lab needed

## 2015-12-12 NOTE — Assessment & Plan Note (Signed)
Controlled, no change in medication  

## 2015-12-13 ENCOUNTER — Telehealth: Payer: Self-pay | Admitting: Family Medicine

## 2015-12-13 NOTE — Telephone Encounter (Signed)
Pls enter HBA1C 7.3 and LDL of 36 on 05/05/20176 Explain blood sugar increased slightly, so reduce carbs and sweets etc, inc vegetables LDL is great!, but TG up at 193, reduce cheese, fried and fatty foods, no change in meds  Thyrouid test is  And Vit D , 2.06 and 48.6. Continue current doses of synthroid and her vit D supplement  Labs overall are very good.  Continue to follow kidney function with nephrologist as before  (EGFR is 14)

## 2015-12-13 NOTE — Telephone Encounter (Signed)
Noted.  Referral completed for [facility].

## 2015-12-13 NOTE — Addendum Note (Signed)
Addended by: Eual Fines on: 12/13/2015 08:41 AM   Modules accepted: Orders

## 2015-12-14 DIAGNOSIS — E785 Hyperlipidemia, unspecified: Secondary | ICD-10-CM | POA: Diagnosis not present

## 2015-12-14 DIAGNOSIS — E039 Hypothyroidism, unspecified: Secondary | ICD-10-CM | POA: Diagnosis not present

## 2015-12-14 DIAGNOSIS — M5441 Lumbago with sciatica, right side: Secondary | ICD-10-CM | POA: Diagnosis not present

## 2015-12-14 DIAGNOSIS — R296 Repeated falls: Secondary | ICD-10-CM | POA: Diagnosis not present

## 2015-12-14 DIAGNOSIS — I10 Essential (primary) hypertension: Secondary | ICD-10-CM | POA: Diagnosis not present

## 2015-12-14 DIAGNOSIS — R2689 Other abnormalities of gait and mobility: Secondary | ICD-10-CM | POA: Diagnosis not present

## 2015-12-14 DIAGNOSIS — Z9181 History of falling: Secondary | ICD-10-CM | POA: Diagnosis not present

## 2015-12-14 NOTE — Telephone Encounter (Signed)
Patient aware and results entered

## 2015-12-17 ENCOUNTER — Other Ambulatory Visit: Payer: Self-pay

## 2015-12-17 ENCOUNTER — Other Ambulatory Visit: Payer: Self-pay | Admitting: Family Medicine

## 2015-12-17 DIAGNOSIS — R2689 Other abnormalities of gait and mobility: Secondary | ICD-10-CM | POA: Diagnosis not present

## 2015-12-17 DIAGNOSIS — E785 Hyperlipidemia, unspecified: Secondary | ICD-10-CM | POA: Diagnosis not present

## 2015-12-17 DIAGNOSIS — E039 Hypothyroidism, unspecified: Secondary | ICD-10-CM | POA: Diagnosis not present

## 2015-12-17 DIAGNOSIS — M5441 Lumbago with sciatica, right side: Secondary | ICD-10-CM | POA: Diagnosis not present

## 2015-12-17 DIAGNOSIS — I10 Essential (primary) hypertension: Secondary | ICD-10-CM | POA: Diagnosis not present

## 2015-12-17 DIAGNOSIS — R296 Repeated falls: Secondary | ICD-10-CM | POA: Diagnosis not present

## 2015-12-17 MED ORDER — TRAZODONE HCL 100 MG PO TABS: 100.0000 mg | ORAL_TABLET | Freq: Every day | ORAL | Status: DC

## 2015-12-17 MED ORDER — GABAPENTIN 300 MG PO CAPS: ORAL_CAPSULE | ORAL | Status: DC

## 2015-12-21 DIAGNOSIS — E039 Hypothyroidism, unspecified: Secondary | ICD-10-CM | POA: Diagnosis not present

## 2015-12-21 DIAGNOSIS — I10 Essential (primary) hypertension: Secondary | ICD-10-CM | POA: Diagnosis not present

## 2015-12-21 DIAGNOSIS — M5441 Lumbago with sciatica, right side: Secondary | ICD-10-CM | POA: Diagnosis not present

## 2015-12-21 DIAGNOSIS — E785 Hyperlipidemia, unspecified: Secondary | ICD-10-CM | POA: Diagnosis not present

## 2015-12-21 DIAGNOSIS — R2689 Other abnormalities of gait and mobility: Secondary | ICD-10-CM | POA: Diagnosis not present

## 2015-12-21 DIAGNOSIS — R296 Repeated falls: Secondary | ICD-10-CM | POA: Diagnosis not present

## 2015-12-23 ENCOUNTER — Other Ambulatory Visit: Payer: Self-pay | Admitting: *Deleted

## 2015-12-23 DIAGNOSIS — I10 Essential (primary) hypertension: Secondary | ICD-10-CM | POA: Diagnosis not present

## 2015-12-23 DIAGNOSIS — E785 Hyperlipidemia, unspecified: Secondary | ICD-10-CM | POA: Diagnosis not present

## 2015-12-23 DIAGNOSIS — R2689 Other abnormalities of gait and mobility: Secondary | ICD-10-CM | POA: Diagnosis not present

## 2015-12-23 DIAGNOSIS — R296 Repeated falls: Secondary | ICD-10-CM | POA: Diagnosis not present

## 2015-12-23 DIAGNOSIS — M5441 Lumbago with sciatica, right side: Secondary | ICD-10-CM | POA: Diagnosis not present

## 2015-12-23 DIAGNOSIS — E039 Hypothyroidism, unspecified: Secondary | ICD-10-CM | POA: Diagnosis not present

## 2015-12-23 MED ORDER — PREDNISONE 10 MG PO TABS: 10.0000 mg | ORAL_TABLET | Freq: Every day | ORAL | Status: DC

## 2015-12-24 ENCOUNTER — Other Ambulatory Visit: Payer: Self-pay

## 2015-12-24 DIAGNOSIS — M5489 Other dorsalgia: Secondary | ICD-10-CM

## 2015-12-24 MED ORDER — METHADONE HCL 10 MG PO TABS: 10.0000 mg | ORAL_TABLET | Freq: Three times a day (TID) | ORAL | Status: DC

## 2015-12-28 DIAGNOSIS — R296 Repeated falls: Secondary | ICD-10-CM | POA: Diagnosis not present

## 2015-12-28 DIAGNOSIS — E785 Hyperlipidemia, unspecified: Secondary | ICD-10-CM | POA: Diagnosis not present

## 2015-12-28 DIAGNOSIS — M5441 Lumbago with sciatica, right side: Secondary | ICD-10-CM | POA: Diagnosis not present

## 2015-12-28 DIAGNOSIS — I10 Essential (primary) hypertension: Secondary | ICD-10-CM | POA: Diagnosis not present

## 2015-12-28 DIAGNOSIS — R2689 Other abnormalities of gait and mobility: Secondary | ICD-10-CM | POA: Diagnosis not present

## 2015-12-28 DIAGNOSIS — E039 Hypothyroidism, unspecified: Secondary | ICD-10-CM | POA: Diagnosis not present

## 2015-12-31 DIAGNOSIS — E039 Hypothyroidism, unspecified: Secondary | ICD-10-CM | POA: Diagnosis not present

## 2015-12-31 DIAGNOSIS — M5441 Lumbago with sciatica, right side: Secondary | ICD-10-CM | POA: Diagnosis not present

## 2015-12-31 DIAGNOSIS — E785 Hyperlipidemia, unspecified: Secondary | ICD-10-CM | POA: Diagnosis not present

## 2015-12-31 DIAGNOSIS — I10 Essential (primary) hypertension: Secondary | ICD-10-CM | POA: Diagnosis not present

## 2015-12-31 DIAGNOSIS — R2689 Other abnormalities of gait and mobility: Secondary | ICD-10-CM | POA: Diagnosis not present

## 2015-12-31 DIAGNOSIS — R296 Repeated falls: Secondary | ICD-10-CM | POA: Diagnosis not present

## 2016-01-04 ENCOUNTER — Other Ambulatory Visit: Payer: Self-pay

## 2016-01-04 DIAGNOSIS — R296 Repeated falls: Secondary | ICD-10-CM | POA: Diagnosis not present

## 2016-01-04 DIAGNOSIS — M5441 Lumbago with sciatica, right side: Secondary | ICD-10-CM | POA: Diagnosis not present

## 2016-01-04 DIAGNOSIS — R2689 Other abnormalities of gait and mobility: Secondary | ICD-10-CM | POA: Diagnosis not present

## 2016-01-04 DIAGNOSIS — E039 Hypothyroidism, unspecified: Secondary | ICD-10-CM | POA: Diagnosis not present

## 2016-01-04 DIAGNOSIS — E785 Hyperlipidemia, unspecified: Secondary | ICD-10-CM | POA: Diagnosis not present

## 2016-01-04 DIAGNOSIS — I10 Essential (primary) hypertension: Secondary | ICD-10-CM | POA: Diagnosis not present

## 2016-01-04 MED ORDER — LEVOTHYROXINE SODIUM 125 MCG PO TABS: 125.0000 ug | ORAL_TABLET | Freq: Every day | ORAL | Status: DC

## 2016-01-06 ENCOUNTER — Other Ambulatory Visit: Payer: Self-pay | Admitting: *Deleted

## 2016-01-06 DIAGNOSIS — R2689 Other abnormalities of gait and mobility: Secondary | ICD-10-CM | POA: Diagnosis not present

## 2016-01-06 DIAGNOSIS — I10 Essential (primary) hypertension: Secondary | ICD-10-CM | POA: Diagnosis not present

## 2016-01-06 DIAGNOSIS — E785 Hyperlipidemia, unspecified: Secondary | ICD-10-CM | POA: Diagnosis not present

## 2016-01-06 DIAGNOSIS — M5441 Lumbago with sciatica, right side: Secondary | ICD-10-CM | POA: Diagnosis not present

## 2016-01-06 DIAGNOSIS — R296 Repeated falls: Secondary | ICD-10-CM | POA: Diagnosis not present

## 2016-01-06 DIAGNOSIS — E039 Hypothyroidism, unspecified: Secondary | ICD-10-CM | POA: Diagnosis not present

## 2016-01-06 MED ORDER — PREDNISONE 10 MG PO TABS: 10.0000 mg | ORAL_TABLET | Freq: Every day | ORAL | Status: DC

## 2016-01-11 DIAGNOSIS — R296 Repeated falls: Secondary | ICD-10-CM | POA: Diagnosis not present

## 2016-01-11 DIAGNOSIS — E785 Hyperlipidemia, unspecified: Secondary | ICD-10-CM | POA: Diagnosis not present

## 2016-01-11 DIAGNOSIS — I10 Essential (primary) hypertension: Secondary | ICD-10-CM | POA: Diagnosis not present

## 2016-01-11 DIAGNOSIS — M5441 Lumbago with sciatica, right side: Secondary | ICD-10-CM | POA: Diagnosis not present

## 2016-01-11 DIAGNOSIS — R2689 Other abnormalities of gait and mobility: Secondary | ICD-10-CM | POA: Diagnosis not present

## 2016-01-11 DIAGNOSIS — E039 Hypothyroidism, unspecified: Secondary | ICD-10-CM | POA: Diagnosis not present

## 2016-01-13 ENCOUNTER — Telehealth: Payer: Self-pay | Admitting: Family Medicine

## 2016-01-13 NOTE — Telephone Encounter (Signed)
Spoke with patient.  Advised her to hold medication x 1 week.  If rash has resolved then restart medication to see if reaction comes back.  She will call back next week to follow up with office.

## 2016-01-13 NOTE — Telephone Encounter (Signed)
Patient is stating that she is broken out with a rash on face legs and arms, she is thinking it could be the Cymbalta and she is asking if it could stop the medication, please advise?

## 2016-01-14 DIAGNOSIS — I10 Essential (primary) hypertension: Secondary | ICD-10-CM | POA: Diagnosis not present

## 2016-01-14 DIAGNOSIS — E039 Hypothyroidism, unspecified: Secondary | ICD-10-CM | POA: Diagnosis not present

## 2016-01-14 DIAGNOSIS — R2689 Other abnormalities of gait and mobility: Secondary | ICD-10-CM | POA: Diagnosis not present

## 2016-01-14 DIAGNOSIS — R296 Repeated falls: Secondary | ICD-10-CM | POA: Diagnosis not present

## 2016-01-14 DIAGNOSIS — M5441 Lumbago with sciatica, right side: Secondary | ICD-10-CM | POA: Diagnosis not present

## 2016-01-14 DIAGNOSIS — E785 Hyperlipidemia, unspecified: Secondary | ICD-10-CM | POA: Diagnosis not present

## 2016-01-17 DIAGNOSIS — E039 Hypothyroidism, unspecified: Secondary | ICD-10-CM | POA: Diagnosis not present

## 2016-01-17 DIAGNOSIS — I10 Essential (primary) hypertension: Secondary | ICD-10-CM

## 2016-01-17 DIAGNOSIS — R296 Repeated falls: Secondary | ICD-10-CM | POA: Diagnosis not present

## 2016-01-17 DIAGNOSIS — R2689 Other abnormalities of gait and mobility: Secondary | ICD-10-CM

## 2016-01-17 DIAGNOSIS — M5441 Lumbago with sciatica, right side: Secondary | ICD-10-CM

## 2016-01-17 DIAGNOSIS — E785 Hyperlipidemia, unspecified: Secondary | ICD-10-CM

## 2016-01-18 ENCOUNTER — Other Ambulatory Visit: Payer: Self-pay

## 2016-01-18 ENCOUNTER — Telehealth: Payer: Self-pay | Admitting: Family Medicine

## 2016-01-18 DIAGNOSIS — E785 Hyperlipidemia, unspecified: Secondary | ICD-10-CM | POA: Diagnosis not present

## 2016-01-18 DIAGNOSIS — E039 Hypothyroidism, unspecified: Secondary | ICD-10-CM | POA: Diagnosis not present

## 2016-01-18 DIAGNOSIS — M5441 Lumbago with sciatica, right side: Secondary | ICD-10-CM | POA: Diagnosis not present

## 2016-01-18 DIAGNOSIS — R2689 Other abnormalities of gait and mobility: Secondary | ICD-10-CM | POA: Diagnosis not present

## 2016-01-18 DIAGNOSIS — I10 Essential (primary) hypertension: Secondary | ICD-10-CM | POA: Diagnosis not present

## 2016-01-18 DIAGNOSIS — R296 Repeated falls: Secondary | ICD-10-CM | POA: Diagnosis not present

## 2016-01-18 MED ORDER — ROSUVASTATIN CALCIUM 40 MG PO TABS: 40.0000 mg | ORAL_TABLET | Freq: Every day | ORAL | Status: DC

## 2016-01-18 MED ORDER — ESOMEPRAZOLE MAGNESIUM 40 MG PO CPDR: DELAYED_RELEASE_CAPSULE | ORAL | Status: DC

## 2016-01-18 NOTE — Telephone Encounter (Signed)
Patient is calling asking for a refill on NEXIUM 40 MG capsule and rosuvastatin (CRESTOR) 40 MG tablet called to the pharmacy

## 2016-01-18 NOTE — Telephone Encounter (Signed)
Medications refilled as requested to [facility]

## 2016-01-19 ENCOUNTER — Other Ambulatory Visit: Payer: Self-pay | Admitting: Family Medicine

## 2016-01-19 ENCOUNTER — Other Ambulatory Visit: Payer: Self-pay

## 2016-01-19 DIAGNOSIS — R809 Proteinuria, unspecified: Secondary | ICD-10-CM | POA: Diagnosis not present

## 2016-01-19 DIAGNOSIS — D509 Iron deficiency anemia, unspecified: Secondary | ICD-10-CM | POA: Diagnosis not present

## 2016-01-19 DIAGNOSIS — E559 Vitamin D deficiency, unspecified: Secondary | ICD-10-CM | POA: Diagnosis not present

## 2016-01-19 DIAGNOSIS — N183 Chronic kidney disease, stage 3 (moderate): Secondary | ICD-10-CM | POA: Diagnosis not present

## 2016-01-19 DIAGNOSIS — Z79899 Other long term (current) drug therapy: Secondary | ICD-10-CM | POA: Diagnosis not present

## 2016-01-19 DIAGNOSIS — I10 Essential (primary) hypertension: Secondary | ICD-10-CM | POA: Diagnosis not present

## 2016-01-19 MED ORDER — ROSUVASTATIN CALCIUM 40 MG PO TABS: 40.0000 mg | ORAL_TABLET | Freq: Every day | ORAL | Status: DC

## 2016-01-20 DIAGNOSIS — E785 Hyperlipidemia, unspecified: Secondary | ICD-10-CM | POA: Diagnosis not present

## 2016-01-20 DIAGNOSIS — R296 Repeated falls: Secondary | ICD-10-CM | POA: Diagnosis not present

## 2016-01-20 DIAGNOSIS — M5441 Lumbago with sciatica, right side: Secondary | ICD-10-CM | POA: Diagnosis not present

## 2016-01-20 DIAGNOSIS — E039 Hypothyroidism, unspecified: Secondary | ICD-10-CM | POA: Diagnosis not present

## 2016-01-20 DIAGNOSIS — I10 Essential (primary) hypertension: Secondary | ICD-10-CM | POA: Diagnosis not present

## 2016-01-20 DIAGNOSIS — R2689 Other abnormalities of gait and mobility: Secondary | ICD-10-CM | POA: Diagnosis not present

## 2016-01-21 ENCOUNTER — Other Ambulatory Visit: Payer: Self-pay

## 2016-01-21 DIAGNOSIS — M5489 Other dorsalgia: Secondary | ICD-10-CM

## 2016-01-21 MED ORDER — METHADONE HCL 10 MG PO TABS: 10.0000 mg | ORAL_TABLET | Freq: Three times a day (TID) | ORAL | Status: DC

## 2016-01-24 DIAGNOSIS — E785 Hyperlipidemia, unspecified: Secondary | ICD-10-CM | POA: Diagnosis not present

## 2016-01-24 DIAGNOSIS — M5441 Lumbago with sciatica, right side: Secondary | ICD-10-CM | POA: Diagnosis not present

## 2016-01-24 DIAGNOSIS — R296 Repeated falls: Secondary | ICD-10-CM | POA: Diagnosis not present

## 2016-01-24 DIAGNOSIS — R2689 Other abnormalities of gait and mobility: Secondary | ICD-10-CM | POA: Diagnosis not present

## 2016-01-24 DIAGNOSIS — I10 Essential (primary) hypertension: Secondary | ICD-10-CM | POA: Diagnosis not present

## 2016-01-24 DIAGNOSIS — E039 Hypothyroidism, unspecified: Secondary | ICD-10-CM | POA: Diagnosis not present

## 2016-01-25 DIAGNOSIS — E785 Hyperlipidemia, unspecified: Secondary | ICD-10-CM | POA: Diagnosis not present

## 2016-01-25 DIAGNOSIS — R2689 Other abnormalities of gait and mobility: Secondary | ICD-10-CM | POA: Diagnosis not present

## 2016-01-25 DIAGNOSIS — E039 Hypothyroidism, unspecified: Secondary | ICD-10-CM | POA: Diagnosis not present

## 2016-01-25 DIAGNOSIS — R296 Repeated falls: Secondary | ICD-10-CM | POA: Diagnosis not present

## 2016-01-25 DIAGNOSIS — I10 Essential (primary) hypertension: Secondary | ICD-10-CM | POA: Diagnosis not present

## 2016-01-25 DIAGNOSIS — M5441 Lumbago with sciatica, right side: Secondary | ICD-10-CM | POA: Diagnosis not present

## 2016-01-26 ENCOUNTER — Telehealth: Payer: Self-pay | Admitting: Family Medicine

## 2016-01-26 MED ORDER — ESOMEPRAZOLE MAGNESIUM 40 MG PO CPDR: DELAYED_RELEASE_CAPSULE | ORAL | Status: DC

## 2016-01-26 NOTE — Telephone Encounter (Signed)
Med refilled.

## 2016-01-26 NOTE — Telephone Encounter (Signed)
Patient is asking for a refill on esomeprazole (NEXIUM) 40 MG capsule please advise?

## 2016-01-27 ENCOUNTER — Other Ambulatory Visit: Payer: Self-pay | Admitting: Family Medicine

## 2016-01-27 DIAGNOSIS — I10 Essential (primary) hypertension: Secondary | ICD-10-CM | POA: Diagnosis not present

## 2016-01-27 DIAGNOSIS — E785 Hyperlipidemia, unspecified: Secondary | ICD-10-CM | POA: Diagnosis not present

## 2016-01-27 DIAGNOSIS — R296 Repeated falls: Secondary | ICD-10-CM | POA: Diagnosis not present

## 2016-01-27 DIAGNOSIS — R2689 Other abnormalities of gait and mobility: Secondary | ICD-10-CM | POA: Diagnosis not present

## 2016-01-27 DIAGNOSIS — M5441 Lumbago with sciatica, right side: Secondary | ICD-10-CM | POA: Diagnosis not present

## 2016-01-27 DIAGNOSIS — E039 Hypothyroidism, unspecified: Secondary | ICD-10-CM | POA: Diagnosis not present

## 2016-01-28 DIAGNOSIS — R296 Repeated falls: Secondary | ICD-10-CM | POA: Diagnosis not present

## 2016-01-28 DIAGNOSIS — I10 Essential (primary) hypertension: Secondary | ICD-10-CM | POA: Diagnosis not present

## 2016-01-28 DIAGNOSIS — M5441 Lumbago with sciatica, right side: Secondary | ICD-10-CM | POA: Diagnosis not present

## 2016-01-28 DIAGNOSIS — E785 Hyperlipidemia, unspecified: Secondary | ICD-10-CM | POA: Diagnosis not present

## 2016-01-28 DIAGNOSIS — R2689 Other abnormalities of gait and mobility: Secondary | ICD-10-CM | POA: Diagnosis not present

## 2016-01-28 DIAGNOSIS — E039 Hypothyroidism, unspecified: Secondary | ICD-10-CM | POA: Diagnosis not present

## 2016-02-01 DIAGNOSIS — R296 Repeated falls: Secondary | ICD-10-CM | POA: Diagnosis not present

## 2016-02-01 DIAGNOSIS — R2689 Other abnormalities of gait and mobility: Secondary | ICD-10-CM | POA: Diagnosis not present

## 2016-02-01 DIAGNOSIS — I10 Essential (primary) hypertension: Secondary | ICD-10-CM | POA: Diagnosis not present

## 2016-02-01 DIAGNOSIS — E039 Hypothyroidism, unspecified: Secondary | ICD-10-CM | POA: Diagnosis not present

## 2016-02-01 DIAGNOSIS — E785 Hyperlipidemia, unspecified: Secondary | ICD-10-CM | POA: Diagnosis not present

## 2016-02-01 DIAGNOSIS — M5441 Lumbago with sciatica, right side: Secondary | ICD-10-CM | POA: Diagnosis not present

## 2016-02-02 DIAGNOSIS — E039 Hypothyroidism, unspecified: Secondary | ICD-10-CM | POA: Diagnosis not present

## 2016-02-02 DIAGNOSIS — M5441 Lumbago with sciatica, right side: Secondary | ICD-10-CM | POA: Diagnosis not present

## 2016-02-02 DIAGNOSIS — R296 Repeated falls: Secondary | ICD-10-CM | POA: Diagnosis not present

## 2016-02-02 DIAGNOSIS — E785 Hyperlipidemia, unspecified: Secondary | ICD-10-CM | POA: Diagnosis not present

## 2016-02-02 DIAGNOSIS — I10 Essential (primary) hypertension: Secondary | ICD-10-CM | POA: Diagnosis not present

## 2016-02-02 DIAGNOSIS — R2689 Other abnormalities of gait and mobility: Secondary | ICD-10-CM | POA: Diagnosis not present

## 2016-02-04 DIAGNOSIS — M5441 Lumbago with sciatica, right side: Secondary | ICD-10-CM | POA: Diagnosis not present

## 2016-02-04 DIAGNOSIS — I10 Essential (primary) hypertension: Secondary | ICD-10-CM | POA: Diagnosis not present

## 2016-02-04 DIAGNOSIS — E785 Hyperlipidemia, unspecified: Secondary | ICD-10-CM | POA: Diagnosis not present

## 2016-02-04 DIAGNOSIS — R2689 Other abnormalities of gait and mobility: Secondary | ICD-10-CM | POA: Diagnosis not present

## 2016-02-04 DIAGNOSIS — R296 Repeated falls: Secondary | ICD-10-CM | POA: Diagnosis not present

## 2016-02-04 DIAGNOSIS — E039 Hypothyroidism, unspecified: Secondary | ICD-10-CM | POA: Diagnosis not present

## 2016-02-11 DIAGNOSIS — M5441 Lumbago with sciatica, right side: Secondary | ICD-10-CM | POA: Diagnosis not present

## 2016-02-11 DIAGNOSIS — R2689 Other abnormalities of gait and mobility: Secondary | ICD-10-CM | POA: Diagnosis not present

## 2016-02-11 DIAGNOSIS — E039 Hypothyroidism, unspecified: Secondary | ICD-10-CM | POA: Diagnosis not present

## 2016-02-11 DIAGNOSIS — E785 Hyperlipidemia, unspecified: Secondary | ICD-10-CM | POA: Diagnosis not present

## 2016-02-11 DIAGNOSIS — I10 Essential (primary) hypertension: Secondary | ICD-10-CM | POA: Diagnosis not present

## 2016-02-11 DIAGNOSIS — R296 Repeated falls: Secondary | ICD-10-CM | POA: Diagnosis not present

## 2016-02-12 DIAGNOSIS — E039 Hypothyroidism, unspecified: Secondary | ICD-10-CM | POA: Diagnosis not present

## 2016-02-12 DIAGNOSIS — Z9181 History of falling: Secondary | ICD-10-CM | POA: Diagnosis not present

## 2016-02-12 DIAGNOSIS — E785 Hyperlipidemia, unspecified: Secondary | ICD-10-CM | POA: Diagnosis not present

## 2016-02-12 DIAGNOSIS — R2689 Other abnormalities of gait and mobility: Secondary | ICD-10-CM | POA: Diagnosis not present

## 2016-02-12 DIAGNOSIS — R296 Repeated falls: Secondary | ICD-10-CM | POA: Diagnosis not present

## 2016-02-12 DIAGNOSIS — M5441 Lumbago with sciatica, right side: Secondary | ICD-10-CM | POA: Diagnosis not present

## 2016-02-12 DIAGNOSIS — I10 Essential (primary) hypertension: Secondary | ICD-10-CM | POA: Diagnosis not present

## 2016-02-14 ENCOUNTER — Telehealth: Payer: Self-pay | Admitting: Family Medicine

## 2016-02-14 NOTE — Telephone Encounter (Signed)
Katelyn Rowland is asking for a returned call from the nurse, please advise?

## 2016-02-14 NOTE — Telephone Encounter (Signed)
Wanted letter written that she was homebound to send to the [facility] to get a rep to come to her home for an ID renewal. Letter is typed and in your box.

## 2016-02-14 NOTE — Telephone Encounter (Signed)
Thanks i have signed it

## 2016-02-15 DIAGNOSIS — E785 Hyperlipidemia, unspecified: Secondary | ICD-10-CM | POA: Diagnosis not present

## 2016-02-15 DIAGNOSIS — E039 Hypothyroidism, unspecified: Secondary | ICD-10-CM | POA: Diagnosis not present

## 2016-02-15 DIAGNOSIS — R296 Repeated falls: Secondary | ICD-10-CM | POA: Diagnosis not present

## 2016-02-15 DIAGNOSIS — M5441 Lumbago with sciatica, right side: Secondary | ICD-10-CM | POA: Diagnosis not present

## 2016-02-15 DIAGNOSIS — R2689 Other abnormalities of gait and mobility: Secondary | ICD-10-CM | POA: Diagnosis not present

## 2016-02-15 DIAGNOSIS — I10 Essential (primary) hypertension: Secondary | ICD-10-CM | POA: Diagnosis not present

## 2016-02-18 ENCOUNTER — Other Ambulatory Visit: Payer: Self-pay

## 2016-02-18 DIAGNOSIS — M5489 Other dorsalgia: Secondary | ICD-10-CM

## 2016-02-18 MED ORDER — METHADONE HCL 10 MG PO TABS: 10.0000 mg | ORAL_TABLET | Freq: Three times a day (TID) | ORAL | Status: DC

## 2016-02-22 DIAGNOSIS — E785 Hyperlipidemia, unspecified: Secondary | ICD-10-CM | POA: Diagnosis not present

## 2016-02-22 DIAGNOSIS — I10 Essential (primary) hypertension: Secondary | ICD-10-CM | POA: Diagnosis not present

## 2016-02-22 DIAGNOSIS — M5441 Lumbago with sciatica, right side: Secondary | ICD-10-CM | POA: Diagnosis not present

## 2016-02-22 DIAGNOSIS — E039 Hypothyroidism, unspecified: Secondary | ICD-10-CM | POA: Diagnosis not present

## 2016-02-22 DIAGNOSIS — R2689 Other abnormalities of gait and mobility: Secondary | ICD-10-CM | POA: Diagnosis not present

## 2016-02-22 DIAGNOSIS — R296 Repeated falls: Secondary | ICD-10-CM | POA: Diagnosis not present

## 2016-02-24 DIAGNOSIS — D638 Anemia in other chronic diseases classified elsewhere: Secondary | ICD-10-CM | POA: Diagnosis not present

## 2016-02-24 DIAGNOSIS — R809 Proteinuria, unspecified: Secondary | ICD-10-CM | POA: Diagnosis not present

## 2016-02-24 DIAGNOSIS — N184 Chronic kidney disease, stage 4 (severe): Secondary | ICD-10-CM | POA: Diagnosis not present

## 2016-02-24 DIAGNOSIS — N2581 Secondary hyperparathyroidism of renal origin: Secondary | ICD-10-CM | POA: Diagnosis not present

## 2016-02-25 DIAGNOSIS — R2689 Other abnormalities of gait and mobility: Secondary | ICD-10-CM | POA: Diagnosis not present

## 2016-02-25 DIAGNOSIS — M5441 Lumbago with sciatica, right side: Secondary | ICD-10-CM | POA: Diagnosis not present

## 2016-02-25 DIAGNOSIS — E039 Hypothyroidism, unspecified: Secondary | ICD-10-CM | POA: Diagnosis not present

## 2016-02-25 DIAGNOSIS — E785 Hyperlipidemia, unspecified: Secondary | ICD-10-CM | POA: Diagnosis not present

## 2016-02-25 DIAGNOSIS — I10 Essential (primary) hypertension: Secondary | ICD-10-CM | POA: Diagnosis not present

## 2016-02-25 DIAGNOSIS — R296 Repeated falls: Secondary | ICD-10-CM | POA: Diagnosis not present

## 2016-03-06 DIAGNOSIS — R2689 Other abnormalities of gait and mobility: Secondary | ICD-10-CM | POA: Diagnosis not present

## 2016-03-06 DIAGNOSIS — I10 Essential (primary) hypertension: Secondary | ICD-10-CM | POA: Diagnosis not present

## 2016-03-06 DIAGNOSIS — E039 Hypothyroidism, unspecified: Secondary | ICD-10-CM | POA: Diagnosis not present

## 2016-03-06 DIAGNOSIS — R296 Repeated falls: Secondary | ICD-10-CM | POA: Diagnosis not present

## 2016-03-06 DIAGNOSIS — E785 Hyperlipidemia, unspecified: Secondary | ICD-10-CM | POA: Diagnosis not present

## 2016-03-06 DIAGNOSIS — M5441 Lumbago with sciatica, right side: Secondary | ICD-10-CM | POA: Diagnosis not present

## 2016-03-13 ENCOUNTER — Encounter: Payer: Self-pay | Admitting: Family Medicine

## 2016-03-13 ENCOUNTER — Ambulatory Visit (INDEPENDENT_AMBULATORY_CARE_PROVIDER_SITE_OTHER): Payer: Medicare Other | Admitting: Family Medicine

## 2016-03-13 VITALS — BP 124/80 | HR 68 | Resp 18 | Ht 64.0 in | Wt 204.0 lb

## 2016-03-13 DIAGNOSIS — E1169 Type 2 diabetes mellitus with other specified complication: Secondary | ICD-10-CM

## 2016-03-13 DIAGNOSIS — R296 Repeated falls: Secondary | ICD-10-CM

## 2016-03-13 DIAGNOSIS — E119 Type 2 diabetes mellitus without complications: Secondary | ICD-10-CM | POA: Diagnosis not present

## 2016-03-13 DIAGNOSIS — I10 Essential (primary) hypertension: Secondary | ICD-10-CM

## 2016-03-13 DIAGNOSIS — M159 Polyosteoarthritis, unspecified: Secondary | ICD-10-CM | POA: Diagnosis not present

## 2016-03-13 DIAGNOSIS — E038 Other specified hypothyroidism: Secondary | ICD-10-CM

## 2016-03-13 DIAGNOSIS — M5440 Lumbago with sciatica, unspecified side: Secondary | ICD-10-CM

## 2016-03-13 DIAGNOSIS — E669 Obesity, unspecified: Secondary | ICD-10-CM

## 2016-03-13 DIAGNOSIS — Z9181 History of falling: Secondary | ICD-10-CM

## 2016-03-13 DIAGNOSIS — R7302 Impaired glucose tolerance (oral): Secondary | ICD-10-CM

## 2016-03-13 MED ORDER — GABAPENTIN 300 MG PO CAPS: 300.0000 mg | ORAL_CAPSULE | Freq: Three times a day (TID) | ORAL | 3 refills | Status: DC

## 2016-03-13 MED ORDER — PREDNISONE 20 MG PO TABS: ORAL_TABLET | ORAL | 0 refills | Status: DC

## 2016-03-13 NOTE — Progress Notes (Signed)
Katelyn Rowland     MRN: [id number]      DOB: 1941/04/23   HPI Katelyn Rowland is here for follow up and re-evaluation of chronic medical conditions, medication management and review of any available recent lab and radiology data.  Preventive health is updated, specifically  Cancer screening and Immunization.   Increased and uncontrolled mid and low back pain to rigth hip rateda t an 8 to a 10 Requests motorized wheelchair, has weak grip, reduced ROM upper extremities and lower extremity weekness, poor balance and is at high fall risk due to severe spinal stenosis causing nerve compression  ROS Denies recent fever or chills. Denies sinus pressure, nasal congestion, ear pain or sore throat. Denies chest congestion, productive cough or wheezing. Denies chest pains, palpitations and leg swelling Denies abdominal pain, nausea, vomiting,diarrhea or constipation.   Denies dysuria, frequency, hesitancy or incontinence. C/o increased s joint pain, and limitation in mobility.Feels at high risk for falls inside her home and holds on to walls to reduce fall risk at times. C/o walker with seat is no longer suffucient for in home safety she has upper extremity pain, weakness with limitation in mobility, requests motorized wheelchair for increased independence and safety Denies headaches, seizures, numbness, or tingling. Denies depression, anxiety or insomnia. Denies skin break down or rash.   PE  BP 124/80   Pulse 68   Resp 18   Ht 5\' 4"  (1.626 m)   Wt 204 lb (92.5 kg)   SpO2 94%   BMI 35.02 kg/m   Patient alert and oriented and in no cardiopulmonary distress.  HEENT: No facial asymmetry, EOMI,   oropharynx pink and moist.  Neck supple no JVD, no mass.  Chest: Clear to auscultation bilaterally.  CVS: S1, S2 no murmurs, no S3.Regular rate.  ABD: Soft non tender.   Ext: No edema  MS: decreased  ROM spine, shoulders, hips and knees.  Skin: Intact, no ulcerations or rash noted.  Psych:  Good eye contact, normal affect. Memory intact not anxious or depressed appearing.  CNS: CN 2-12 intact, grade 4 power in upper and lower extremities with reduced tone throughout  Assessment & Plan  At high risk for falls Pt with severe spinal stenosis with upper and lower extremity weakness, also has osteoarthitis of large joints, despite recent In home PT, still at increased fall risk, will benefit from and needs motrized mobility device in the home, refer to OT/pT for evaluation and documention of this need  Fall precautions discussed alsoShe ahs also fallen in the past                                                       Hypothyroidism Controlled, no change in medication   Essential hypertension Controlled, no change in medication   Diabetes mellitus type 2 in obese (HCC) Improved, no change in management Patient educated about the importance of limiting  Carbohydrate intake , the need to commit to daily physical activity for a minimum of 30 minutes , and to commit weight loss. The fact that changes in all these areas will reduce or eliminate all together the development of diabetes is stressed.   Diabetic Labs Latest Ref Rng & Units 12/10/2015 07/06/2015 11/18/2014 10/13/2014 08/25/2014  HbA1c - 7.3 - - 6.9(H) -  Microalbumin Not estab mg/dL -  76.2 - - -  Micro/Creat Ratio <30 mcg/mg creat - 1,859(H) - - -  Chol 0 - 200 mg/dL - - - - -  HDL >39 mg/dL - - - - -  Calc LDL mg/dL 36 - - - -  Triglycerides <150 mg/dL - - - - -  Creatinine 0.50 - 1.10 mg/dL - - 1.84(H) 1.72(H) 2.11(H)   BP/Weight 03/13/2016 12/10/2015 07/06/2015 05/04/2015 03/22/2015 02/24/2015 0000000  Systolic BP A999333 0000000 0000000 Q000111Q A999333 [id number]  Diastolic BP 80 62 62 62 66 78 53  Wt. (Lbs) 204 218 200.12 208.12 208.12 208.12 197  BMI 35.02 37.4 34.33 35.71 35.71 35.71 33.8   Foot/eye exam completion dates 03/13/2016 06/07/2010  Foot exam Order - yes  Foot Form Completion  Done -         Back pain Reports some relief with daily prednisone , will start low dose of 5 mg

## 2016-03-13 NOTE — Patient Instructions (Addendum)
F/u in 3.5 month,c all if you need me before  Dose increase in gabapentin , and 5 day course of prednisone 20 mg for 5 days for back pain and are  sent in    You are referred to PT for evaluation for motorized wheelchair which you need  Labs this week please  You are referred to dr Berline Lopes for diabetic foot exam in Septemeber  Thank you  for choosing [facility]. We consider it a privelige to serve you.  Delivering excellent health care in a caring and  compassionate way is our goal.  Partnering with you,  so that together we can achieve this goal is our strategy.

## 2016-03-14 ENCOUNTER — Other Ambulatory Visit: Payer: Self-pay | Admitting: Family Medicine

## 2016-03-16 ENCOUNTER — Telehealth: Payer: Self-pay | Admitting: Family Medicine

## 2016-03-16 ENCOUNTER — Other Ambulatory Visit: Payer: Self-pay

## 2016-03-16 DIAGNOSIS — M5489 Other dorsalgia: Secondary | ICD-10-CM

## 2016-03-16 MED ORDER — METHADONE HCL 10 MG PO TABS: 10.0000 mg | ORAL_TABLET | Freq: Three times a day (TID) | ORAL | 0 refills | Status: DC

## 2016-03-16 NOTE — Telephone Encounter (Signed)
Tam is asking for a returned call from the nurse, she has something personal to talk to her about

## 2016-03-17 DIAGNOSIS — I10 Essential (primary) hypertension: Secondary | ICD-10-CM | POA: Diagnosis not present

## 2016-03-17 DIAGNOSIS — R7302 Impaired glucose tolerance (oral): Secondary | ICD-10-CM | POA: Diagnosis not present

## 2016-03-17 DIAGNOSIS — E559 Vitamin D deficiency, unspecified: Secondary | ICD-10-CM | POA: Diagnosis not present

## 2016-03-17 DIAGNOSIS — E038 Other specified hypothyroidism: Secondary | ICD-10-CM | POA: Diagnosis not present

## 2016-03-17 DIAGNOSIS — E785 Hyperlipidemia, unspecified: Secondary | ICD-10-CM | POA: Diagnosis not present

## 2016-03-17 NOTE — Telephone Encounter (Signed)
Called patient and left message for them to return call at the office   

## 2016-03-17 NOTE — Telephone Encounter (Signed)
Patient just had a few questions about her diabetic status. She is aware

## 2016-03-20 ENCOUNTER — Other Ambulatory Visit: Payer: Self-pay | Admitting: Family Medicine

## 2016-03-20 DIAGNOSIS — E785 Hyperlipidemia, unspecified: Secondary | ICD-10-CM | POA: Diagnosis not present

## 2016-03-20 DIAGNOSIS — R2689 Other abnormalities of gait and mobility: Secondary | ICD-10-CM | POA: Diagnosis not present

## 2016-03-20 DIAGNOSIS — M5441 Lumbago with sciatica, right side: Secondary | ICD-10-CM | POA: Diagnosis not present

## 2016-03-20 DIAGNOSIS — I10 Essential (primary) hypertension: Secondary | ICD-10-CM | POA: Diagnosis not present

## 2016-03-22 ENCOUNTER — Telehealth: Payer: Self-pay | Admitting: Family Medicine

## 2016-03-22 ENCOUNTER — Other Ambulatory Visit: Payer: Self-pay | Admitting: Family Medicine

## 2016-03-22 DIAGNOSIS — E038 Other specified hypothyroidism: Secondary | ICD-10-CM

## 2016-03-22 DIAGNOSIS — E785 Hyperlipidemia, unspecified: Secondary | ICD-10-CM

## 2016-03-22 DIAGNOSIS — R7302 Impaired glucose tolerance (oral): Secondary | ICD-10-CM

## 2016-03-22 DIAGNOSIS — I10 Essential (primary) hypertension: Secondary | ICD-10-CM

## 2016-03-22 MED ORDER — ROSUVASTATIN CALCIUM 20 MG PO TABS: 20.0000 mg | ORAL_TABLET | Freq: Every day | ORAL | 4 refills | Status: DC

## 2016-03-22 MED ORDER — PREDNISONE 5 MG PO TABS: 5.0000 mg | ORAL_TABLET | Freq: Every day | ORAL | 3 refills | Status: DC

## 2016-03-22 NOTE — Telephone Encounter (Signed)
Patient aware of results.  Understands medication changed of Crestor to 20mg  from 40mg .   Repeat lab order mailed to home address on file.  Note sent to [facility] to d/c Crestor 40mg  and start Crestor 20mg .

## 2016-03-22 NOTE — Telephone Encounter (Signed)
dexa in 2013 established osteoporosis, prednisone makes this worse and inccreases fracture risk bAD iDEA for hIGH dose of prednisone she is requesting. Needs updated dexa, if still osteoporotic , with no improvement, will only be able to get the 5 mg dose, if does not want to rept dexa, oK to cancel the prednisone totally since she is saying 5 mg no good. May also take the 5mg  two twice daily twice per week, she should think about that

## 2016-03-22 NOTE — Assessment & Plan Note (Addendum)
Improved, no change in management Patient educated about the importance of limiting  Carbohydrate intake , the need to commit to daily physical activity for a minimum of 30 minutes , and to commit weight loss. The fact that changes in all these areas will reduce or eliminate all together the development of diabetes is stressed.   Diabetic Labs Latest Ref Rng & Units 12/10/2015 07/06/2015 11/18/2014 10/13/2014 08/25/2014  HbA1c - 7.3 - - 6.9(H) -  Microalbumin Not estab mg/dL - 76.2 - - -  Micro/Creat Ratio <30 mcg/mg creat - 1,859(H) - - -  Chol 0 - 200 mg/dL - - - - -  HDL >39 mg/dL - - - - -  Calc LDL mg/dL 36 - - - -  Triglycerides <150 mg/dL - - - - -  Creatinine 0.50 - 1.10 mg/dL - - 1.84(H) 1.72(H) 2.11(H)   BP/Weight 03/13/2016 12/10/2015 07/06/2015 05/04/2015 03/22/2015 02/24/2015 0000000  Systolic BP A999333 0000000 0000000 Q000111Q A999333 [id number]  Diastolic BP 80 62 62 62 66 78 53  Wt. (Lbs) 204 218 200.12 208.12 208.12 208.12 197  BMI 35.02 37.4 34.33 35.71 35.71 35.71 33.8   Foot/eye exam completion dates 03/13/2016 06/07/2010  Foot exam Order - yes  Foot Form Completion Done -

## 2016-03-22 NOTE — Telephone Encounter (Signed)
Patient states that 5mg  will be ineffective.  Is asking if she can have 10mg  BID or 20mg  daily?     Has OT evaluation next week.

## 2016-03-22 NOTE — Assessment & Plan Note (Signed)
Reports some relief with daily prednisone , will start low dose of 5 mg

## 2016-03-22 NOTE — Assessment & Plan Note (Signed)
Controlled, no change in medication  

## 2016-03-22 NOTE — Telephone Encounter (Signed)
pls let pt know I have reviewed labs, they are in your area, she  Needs to reduce crestor dose to 20 mg daily, now has 40 mg, take every other day till done send in and notify pharmacy also pls new lower dose of 20 mg entered historically Needs to increase water in take , dehydrated Blood sugar 6.2, prediabetic, range, which is good, stay on same dose thyroid med, also excellent vit D level, no other med changes needed  Needs rept fasting lipid , cmp and eGFR, hBA1C and tSH in 4 months also pls

## 2016-03-22 NOTE — Telephone Encounter (Signed)
Patient is asking if she can be put on Prednisone long term.  States that it really helped her back pain.  Please advise.

## 2016-03-22 NOTE — Assessment & Plan Note (Addendum)
Pt with severe spinal stenosis with upper and lower extremity weakness, also has osteoarthitis of large joints, despite recent In home PT, still at increased fall risk, will benefit from and needs motrized mobility device in the home, refer to OT/pT for evaluation and documention of this need  Fall precautions discussed alsoShe ahs also fallen in the past

## 2016-03-22 NOTE — Telephone Encounter (Signed)
Pls let her know I sent in LOWEST dose of prednisone 5 mg one daily, I encourage her to take it 5 days per week, and see if that works . AlSO pls ask if she has appt to go for eval for motorized wheelchair eval at hospital, referral was entered and visit is completed, thanks

## 2016-03-22 NOTE — Telephone Encounter (Signed)
Katelyn Rowland is asking for a refill on her predniSONE (DELTASONE) 20 MG tablet twice a day sent to [facility]

## 2016-03-24 MED ORDER — PREDNISONE 5 MG PO TABS: 10.0000 mg | ORAL_TABLET | Freq: Two times a day (BID) | ORAL | 0 refills | Status: DC

## 2016-03-24 NOTE — Telephone Encounter (Signed)
Patient aware of advice.  Prednisone 5mg  2BID sent to pharmacy with 2 week supply

## 2016-03-24 NOTE — Telephone Encounter (Signed)
noted 

## 2016-03-29 ENCOUNTER — Ambulatory Visit (HOSPITAL_COMMUNITY): Payer: Medicare Other | Attending: Family Medicine | Admitting: Specialist

## 2016-03-29 ENCOUNTER — Encounter (HOSPITAL_COMMUNITY): Payer: Self-pay | Admitting: Specialist

## 2016-03-29 DIAGNOSIS — R262 Difficulty in walking, not elsewhere classified: Secondary | ICD-10-CM | POA: Diagnosis not present

## 2016-03-29 NOTE — Therapy (Signed)
[place], [place], [address] Phone: [phone]   Fax:  [phone]  Occupational Therapy Evaluation  Patient Details  Name: SHEKINAH CIRINO MRN: [id number] Date of Birth: 08-18-1940 Referring Provider: Dr. Tula Nakayama  Encounter Date: 03/29/2016      OT End of Session - 03/29/16 1414    Visit Number 1   Number of Visits 1   Authorization Type Medicare   OT Start Time 1300   OT Stop Time 1350   OT Time Calculation (min) 50 min   Activity Tolerance Patient tolerated treatment well   Behavior During Therapy [facility] for tasks assessed/performed      Past Medical History:  Diagnosis Date  . Anemia   . Anxiety   . Arthritis   . Chronic kidney disease   . Depression   . Diabetes mellitus 2009   no meds  . 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 Edema of both legs   . GERD (gastroesophageal reflux disease)   . Headache(784.0)   . Hematoma of leg    left   . High blood pressure   . Hypertension    margaret simpson  [id number]  . Muscle cramps   . Neck pain   . PONV (postoperative nausea and vomiting)   . Reflux   . Thyroid disorder     Past Surgical History:  Procedure Laterality Date  . ABDOMINAL HYSTERECTOMY    . APPENDECTOMY    . BIOPSY N/A 11/20/2014   Procedure: BIOPSY;  Surgeon: Rogene Houston, MD;  Location: [facility];  Service: Endoscopy;  Laterality: N/A;  . CERVICAL SPINE SURGERY    . ESOPHAGEAL DILATION N/A 11/20/2014   Procedure: ESOPHAGEAL DILATION [place];  Surgeon: Rogene Houston, MD;  Location: [facility];  Service: Endoscopy;  Laterality: N/A;  . ESOPHAGOGASTRODUODENOSCOPY (EGD) WITH PROPOFOL N/A 11/20/2014   Procedure: ESOPHAGOGASTRODUODENOSCOPY (EGD) WITH PROPOFOL;  Surgeon: Rogene Houston, MD;  Location: [facility];  Service: Endoscopy;  Laterality: N/A;  . FOOT NEUROMA SURGERY     right   . ivc filter placement, following  recurrent left DVT  07/2009  . NASAL SINUS SURGERY    . THORACIC DISCECTOMY  06/12/2012   Procedure: THORACIC DISCECTOMY;  Surgeon: Floyce Stakes, MD;  Location: [facility];  Service: Neurosurgery;  Laterality: N/A;  Thoracic Ten-Eleven Thoracic Laminectomy  . TONSILLECTOMY    . TONSILLECTOMY AND ADENOIDECTOMY    . unilateal oopherectomy    . VESICOVAGINAL FISTULA CLOSURE W/ TAH      There were no vitals filed for this visit.      Subjective Assessment - 03/29/16 1412    Subjective  S:  I can't get around my house safely any longer.    Currently in Pain? Yes   Pain Score 9    Pain Location Back   Pain Orientation Lower   Pain Descriptors / Indicators Aching   Pain Type Chronic pain   Pain Onset More than a month ago   Pain Frequency Constant   Aggravating Factors  movement   Pain Relieving Factors prednisone   Effect of Pain on Daily Activities decreased independence with ambulation           [facility] OT Assessment - 03/29/16 0001      Assessment   Diagnosis Difficulty walking   Referring Provider Dr. Tula Nakayama   Onset Date --  chronic  Prior Therapy recenlty finished HH PT      Precautions   Precautions Fall     Restrictions   Weight Bearing Restrictions No     Balance Screen   Has the patient fallen in the past 6 months No   Has the patient had a decrease in activity level because of a fear of falling?  No   Is the patient reluctant to leave their home because of a fear of falling?  No     Home  Environment   Family/patient expects to be discharged to: Private residence     Prior Function   Level of Independence Independent  does not drive   Vocation Retired   Leisure enjoys gardening        Monroe is a 75 year old female who has been referred to occupational therapy for assessment for need of a power wheelchair.  Ms. Teichmann has a past medical history that includes anemia, anxiety, chronic kidney disease,  diabetes mellitus, hypertension, arthritis in her shoulders, hands, back, hips, knees, and feet, peripheral neuropathy in bilateral lower extremities, cervical spine surgery, thoriacic discectomy, and lumbar surgery.  She experiences chronic 9/10 pain in her spine, which causes her to have forward flexed posture at the hips.  Ms. Barkin lives alone in a one story home.  She has two steps to enter her home and is in the process of having a ramp installed to enter her home.  She is able to complete dressing, bathing, grooming, feeding independently.  She has no assistance with I/ADLs such as cooking, cleaning, home maintenance, and laundry.  She has great difficulty completing these activities from a standing position due to increased pain.  However, if she is able to sit, she is able to complete these activities independently.  Ms. Muzny has moderate difficulty walking throughout her home due to increased pain in her back and legs.  She often has to hold onto furniture to balance herself.  Due to her kidney disease, she urinates frequently and with urgency.  Her back pain makes it difficult to ambulate to the bathroom safely in a timely manner.    Obtaining a power wheelchair will allow Ms. Osmun to continue to complete her ADLs and IADLs safely with modified independence. A power wheelchair will also improve her safety in her home. Her goal for obtaining a power wheelchair is to increase her safety and independence in her home.   A FULL PHYSICAL ASSESSMENT REVEALS THE FOLLOWING  Existing Equipment: Ms. Bacak has a walker.  She had a scooter that was given to her by her brother several years ago, however it is no longer operable.   Transfers: Ms. Tavera transferred independently today, however she was unsteady and required increased time to complete.  Ambulation:  Ms. Bogus ambulated greater than 50 feet today without an assistive device.  Therapist provided close supervision due to unsteady gait pattern.   Patient's rate of ambulation was very slow.  Balance:  WFL static sitting and standing balance, dynamic sitting and standing balance is challenged.   Head and Neck: WNL  Trunk: WFL Pelvis: WNL  Hip: AROM is WFL.  Knees: AROM is WFL. Feet and Ankles: AROM is WFL. Upper Extremities: Bilateral upper extremity AROM is WFL.  Strength is 4-/5 in right upper extremity and 3+/5 in left upper extremity. Lower Extremities: right lower extremity strength is 4-/5.  Left lower extremity strength is 3-/5.   Weight Shifting Ability: Independent. Skin  Integrity: WNL. Activity Tolerance:  poor due to increased back pain. GOALS/OBJECTIVE OF SEATING INTERVENTION  Recommendations:  Ms. Lagrassa has functional deficits in the areas of mobility and self-care, which are a result of the above listed diagnosis.  Specific functional limitations include: The patient is unable to walk safely due to increased back and leg pain.  She is unable to functionally propel a lightweight or standard manual wheelchair for independent mobility and complete necessary B/ADLS/IADLs due to shoulder pain..  Ms. Cress will use the power wheelchair on a daily basis as her primary means of mobility.  The recommended wheelchair meets current and future positioning needs, accessibility, durability, and safety requirements for functional use within patient's home environment. It is the least expensive power wheelchair that meets the patients current and future needs.  If you require any further information concerning Ms. Mcbane's  positioning, independence or mobility needs; or any further information why a lesser device will not work, please do not hesitate to contact me at [place] [place], [place] [address] [phone].   Patient will benefit from skilled therapeutic intervention in order to improve the following deficits and impairments:  Abnormal gait  Visit  Diagnosis: Difficulty in walking, not elsewhere classified - Plan: Ot plan of care cert/re-cert      G-Codes - [id number]    Functional Limitation Mobility: Walking and moving around   Mobility: Walking and Moving Around Current Status [phone]) At least 20 percent but less than 40 percent impaired, limited or restricted   Mobility: Walking and Moving Around Discharge Status [phone]) At least 20 percent but less than 40 percent impaired, limited or restricted      Problem List Patient Active Problem List   Diagnosis Date Noted  . Carotid bruit present 12/12/2015  . Muscle spasm 02/24/2015  . Gait instability 10/16/2014  . At high risk for falls 10/16/2014  . CKD (chronic kidney disease) stage 4, GFR 15-29 ml/min (HCC) 10/16/2014  . Anemia in chronic kidney disease 10/16/2014  . GERD with stricture 06/11/2014  . Vitamin D deficiency 02/12/2014  . Lipoma of forearm 01/11/2014  . Right thyroid nodule 01/11/2014  . Cervical neck pain with evidence of disc disease 01/08/2014  . Poor urinary stream 05/26/2013  . Diabetes mellitus type 2 in obese ([place]) 01/25/2013  . Peripheral vascular disease ([place]) 08/11/2012  . Back pain 04/29/2012  . Proteinuria 04/29/2012  . DNR (do not resuscitate) 05/29/2011  . GEN OSTEOARTHROSIS INVOLVING MULTIPLE SITES 08/26/2009  . Morbid obesity ([place]) 04/11/2009  . Hypothyroidism 03/29/2008  . INSOMNIA, CHRONIC 03/29/2008  . Hyperlipidemia LDL goal <100 10/15/2007  . Essential hypertension 10/15/2007   Vangie Bicker, [place], OTR/L [phone]  03/29/2016, 2:18 PM  [place], [place], [address] Phone: [phone]   Fax:  [phone]  Name: ELLYSON GENT MRN: [id number] Date of Birth: [birth year]

## 2016-04-12 ENCOUNTER — Encounter: Payer: Self-pay | Admitting: Family Medicine

## 2016-04-12 ENCOUNTER — Other Ambulatory Visit: Payer: Self-pay | Admitting: Family Medicine

## 2016-04-12 ENCOUNTER — Other Ambulatory Visit: Payer: Self-pay

## 2016-04-12 DIAGNOSIS — M5489 Other dorsalgia: Secondary | ICD-10-CM

## 2016-04-12 MED ORDER — METHADONE HCL 10 MG PO TABS: 10.0000 mg | ORAL_TABLET | Freq: Three times a day (TID) | ORAL | 0 refills | Status: DC

## 2016-05-12 ENCOUNTER — Other Ambulatory Visit: Payer: Self-pay

## 2016-05-12 MED ORDER — METHADONE HCL 10 MG PO TABS: 10.0000 mg | ORAL_TABLET | Freq: Three times a day (TID) | ORAL | 0 refills | Status: DC

## 2016-05-18 ENCOUNTER — Other Ambulatory Visit: Payer: Self-pay | Admitting: Family Medicine

## 2016-05-18 DIAGNOSIS — M159 Polyosteoarthritis, unspecified: Secondary | ICD-10-CM

## 2016-06-09 ENCOUNTER — Other Ambulatory Visit: Payer: Self-pay

## 2016-06-09 MED ORDER — METHADONE HCL 10 MG PO TABS: 10.0000 mg | ORAL_TABLET | Freq: Three times a day (TID) | ORAL | 0 refills | Status: DC

## 2016-06-12 ENCOUNTER — Other Ambulatory Visit: Payer: Self-pay | Admitting: Family Medicine

## 2016-06-12 DIAGNOSIS — I10 Essential (primary) hypertension: Secondary | ICD-10-CM | POA: Diagnosis not present

## 2016-06-12 DIAGNOSIS — D509 Iron deficiency anemia, unspecified: Secondary | ICD-10-CM | POA: Diagnosis not present

## 2016-06-12 DIAGNOSIS — N183 Chronic kidney disease, stage 3 (moderate): Secondary | ICD-10-CM | POA: Diagnosis not present

## 2016-06-12 DIAGNOSIS — R7302 Impaired glucose tolerance (oral): Secondary | ICD-10-CM | POA: Diagnosis not present

## 2016-06-12 DIAGNOSIS — R809 Proteinuria, unspecified: Secondary | ICD-10-CM | POA: Diagnosis not present

## 2016-06-12 DIAGNOSIS — E559 Vitamin D deficiency, unspecified: Secondary | ICD-10-CM | POA: Diagnosis not present

## 2016-06-12 DIAGNOSIS — E038 Other specified hypothyroidism: Secondary | ICD-10-CM | POA: Diagnosis not present

## 2016-06-12 DIAGNOSIS — Z79899 Other long term (current) drug therapy: Secondary | ICD-10-CM | POA: Diagnosis not present

## 2016-06-12 DIAGNOSIS — E785 Hyperlipidemia, unspecified: Secondary | ICD-10-CM | POA: Diagnosis not present

## 2016-06-13 LAB — LIPID PANEL
CHOL/HDL RATIO: 1.8 ratio (ref 0.0–4.4)
CHOLESTEROL TOTAL: 153 mg/dL (ref 100–199)
HDL: 84 mg/dL (ref >39)
LDL CALC: 54 mg/dL (ref 0–99)
Triglycerides: 75 mg/dL (ref 0–149)
VLDL Cholesterol Cal: 15 mg/dL (ref 5–40)

## 2016-06-13 LAB — HEMOGLOBIN A1C
Est. average glucose Bld gHb Est-mCnc: 151 mg/dL
HEMOGLOBIN A1C: 6.9 % — AB (ref 4.8–5.6)

## 2016-06-13 LAB — COMPREHENSIVE METABOLIC PANEL
ALBUMIN: 4.4 g/dL (ref 3.5–4.8)
ALK PHOS: 45 IU/L (ref 39–117)
ALT: 21 IU/L (ref 0–32)
AST: 19 IU/L (ref 0–40)
Albumin/Globulin Ratio: 1.8 (ref 1.2–2.2)
BILIRUBIN TOTAL: 0.3 mg/dL (ref 0.0–1.2)
BUN / CREAT RATIO: 18 (ref 12–28)
BUN: 22 mg/dL (ref 8–27)
CHLORIDE: 98 mmol/L (ref 96–106)
CO2: 27 mmol/L (ref 18–29)
CREATININE: 1.25 mg/dL — AB (ref 0.57–1.00)
Calcium: 9.3 mg/dL (ref 8.7–10.3)
GFR calc non Af Amer: 42 mL/min/{1.73_m2} — ABNORMAL LOW (ref >59)
GFR, EST AFRICAN AMERICAN: 48 mL/min/{1.73_m2} — AB (ref >59)
GLOBULIN, TOTAL: 2.5 g/dL (ref 1.5–4.5)
Glucose: 156 mg/dL — ABNORMAL HIGH (ref 65–99)
Potassium: 5.4 mmol/L — ABNORMAL HIGH (ref 3.5–5.2)
SODIUM: 139 mmol/L (ref 134–144)
Total Protein: 6.9 g/dL (ref 6.0–8.5)

## 2016-06-13 LAB — TSH: TSH: 0.897 u[IU]/mL (ref 0.450–4.500)

## 2016-06-16 ENCOUNTER — Other Ambulatory Visit: Payer: Self-pay | Admitting: Family Medicine

## 2016-06-19 ENCOUNTER — Ambulatory Visit: Payer: Medicare Other | Admitting: Family Medicine

## 2016-06-23 ENCOUNTER — Other Ambulatory Visit: Payer: Self-pay | Admitting: *Deleted

## 2016-06-23 MED ORDER — PREDNISONE 10 MG PO TABS: 10.0000 mg | ORAL_TABLET | Freq: Every day | ORAL | 0 refills | Status: DC

## 2016-07-04 ENCOUNTER — Other Ambulatory Visit: Payer: Self-pay | Admitting: Family Medicine

## 2016-07-07 ENCOUNTER — Other Ambulatory Visit: Payer: Self-pay

## 2016-07-07 MED ORDER — METHADONE HCL 10 MG PO TABS: 10.0000 mg | ORAL_TABLET | Freq: Three times a day (TID) | ORAL | 0 refills | Status: DC

## 2016-07-10 ENCOUNTER — Ambulatory Visit (INDEPENDENT_AMBULATORY_CARE_PROVIDER_SITE_OTHER): Payer: Medicare Other | Admitting: Family Medicine

## 2016-07-10 ENCOUNTER — Encounter: Payer: Self-pay | Admitting: Family Medicine

## 2016-07-10 VITALS — BP 128/74 | HR 86 | Resp 18 | Ht 64.0 in | Wt 203.0 lb

## 2016-07-10 DIAGNOSIS — E785 Hyperlipidemia, unspecified: Secondary | ICD-10-CM

## 2016-07-10 DIAGNOSIS — Z7189 Other specified counseling: Secondary | ICD-10-CM

## 2016-07-10 DIAGNOSIS — E038 Other specified hypothyroidism: Secondary | ICD-10-CM | POA: Diagnosis not present

## 2016-07-10 DIAGNOSIS — E1169 Type 2 diabetes mellitus with other specified complication: Secondary | ICD-10-CM

## 2016-07-10 DIAGNOSIS — I10 Essential (primary) hypertension: Secondary | ICD-10-CM | POA: Diagnosis not present

## 2016-07-10 DIAGNOSIS — M159 Polyosteoarthritis, unspecified: Secondary | ICD-10-CM

## 2016-07-10 DIAGNOSIS — E669 Obesity, unspecified: Secondary | ICD-10-CM

## 2016-07-10 DIAGNOSIS — Z9181 History of falling: Secondary | ICD-10-CM

## 2016-07-10 DIAGNOSIS — E559 Vitamin D deficiency, unspecified: Secondary | ICD-10-CM

## 2016-07-10 DIAGNOSIS — M5440 Lumbago with sciatica, unspecified side: Secondary | ICD-10-CM

## 2016-07-10 MED ORDER — PREDNISONE 10 MG (21) PO TBPK: 10.0000 mg | ORAL_TABLET | Freq: Every day | ORAL | 0 refills | Status: DC

## 2016-07-10 MED ORDER — PREDNISONE 10 MG PO TABS: ORAL_TABLET | ORAL | 4 refills | Status: DC

## 2016-07-10 MED ORDER — GABAPENTIN 300 MG PO CAPS: 300.0000 mg | ORAL_CAPSULE | Freq: Two times a day (BID) | ORAL | 4 refills | Status: DC

## 2016-07-10 NOTE — Patient Instructions (Signed)
F/u in 4 months, call if you need me before  Power wheelchair script sent today, you do NEED this  Prednisone 10 mg dose pack start today then prednisone 10 mg one daily  In January you collect 3 months of scripts for chronic pain and need to see me every 3 months for this  Excellent labs in September, fasting lipid, cmp and eGFr, hBA1C, TSH and vit D level for f/u visit   Thank you  for choosing [facility]. We consider it a privelige to serve you.  Delivering excellent health care in a caring and  compassionate way is our goal.  Partnering with you,  so that together we can achieve this goal is our strategy.

## 2016-07-10 NOTE — Progress Notes (Signed)
MASYN FULLAM     MRN: [id number]      DOB: May 18, 1941   HPI Ms. Cavallaro is here for follow up and re-evaluation of chronic medical conditions, medication management and review of any available recent lab and radiology data.  Preventive health is updated, specifically  Cancer screening and Immunization.  Refuses flu vaccine C/o increased pain and debility in right hipp and buttock, walking is more difficult, had PT for 7 weeks reportedly ,and states no help . Upper extremities are also weak, she has had PT/oT evaluation since August recommending motorized wheelchair as the optimal assistive device based on upper and lower extremity weakness and gait instability Requests manintainance on prednisone for generalized pain and right hip pain as this has helped in the past and with onset of Winter expects flare of pain symptoms New pain contract which is to start in January is also discussed, she will collect 1 3 month supply in January with an April follow up. She understands and is in agreement  ROS: Denies recent fever or chills. Denies sinus pressure, nasal congestion, ear pain or sore throat. Denies chest congestion, productive cough or wheezing. Denies chest pains, palpitations and leg swelling Denies abdominal pain, nausea, vomiting,diarrhea or constipation.   Denies dysuria, frequency, hesitancy or incontinence. Denies headaches, seizures, numbness, or tingling. Denies depression, anxiety or insomnia. Denies skin break down or rash.   PE  BP 128/74   Pulse 86   Resp 18   Ht 5\' 4"  (1.626 m)   Wt 203 lb (92.1 kg)   SpO2 96%   BMI 34.84 kg/m   Patient alert and oriented and in no cardiopulmonary distress.  HEENT: No facial asymmetry, EOMI,   oropharynx pink and moist.  Neck decreased ROM no JVD, no mass.  Chest: Clear to auscultation bilaterally.  CVS: S1, S2 no murmurs, no S3.Regular rate.  ABD: Soft non tender.   Ext: No edema  MS: Markedly reduced  ROM spine,  shoulders, hips and knees.  Skin: Intact, no ulcerations or rash noted.  Psych: Good eye contact, normal affect. Memory intact not anxious or depressed appearing.  CNS: CN 2-12 intact, grade 4  Power in upper extremities,   Assessment & Plan  GEN OSTEOARTHROSIS INVOLVING MULTIPLE SITES Disbling osteoarthritis, high fall risk, unsteady gait, upper extremity weakness, needs motorized / power wheelchair for safe mobility in the home. Failed physical therapy sessions to improve strength and safety, has had physical therapy eval in August supporting need for power wheelchair/ motorized wheelchair  Back pain Increased , prednisone dose pack, 10 mg dose , followed by prednisone 10 mg daily, shortest possible duration of maintenance on prednisone due to adverse s/e profile, pt made aware, and understands , though visibly distressed, requesting maintenance on higher dose prednisone  Diabetes mellitus type 2 in obese (HCC) Controlled, no change in management   Ms. Sundby is reminded of the importance of commitment to daily physical activity for 30 minutes or more, as able and the need to limit carbohydrate intake to 30 to 60 grams per meal to help with blood sugar control.   Ms. Claw is reminded of the importance of daily foot exam, annual eye examination, and good blood sugar, blood pressure and cholesterol control.  Diabetic Labs Latest Ref Rng & Units 06/12/2016 12/10/2015 07/06/2015 11/18/2014 10/13/2014  HbA1c 4.8 - 5.6 % 6.9(H) 7.3 - - 6.9(H)  Microalbumin Not estab mg/dL - - 76.2 - -  Micro/Creat Ratio <30 mcg/mg creat - - 1,859(H) - -  Chol 100 - 199 mg/dL 153 - - - -  HDL >39 mg/dL 84 - - - -  Calc LDL 0 - 99 mg/dL 54 36 - - -  Triglycerides 0 - 149 mg/dL 75 - - - -  Creatinine 0.57 - 1.00 mg/dL 1.25(H) - - 1.84(H) 1.72(H)   BP/Weight 07/10/2016 03/13/2016 12/10/2015 07/06/2015 05/04/2015 03/22/2015 8/65/7846  Systolic BP 962 952 841 324 401 027 253  Diastolic BP 74 80 62 62 62 66 78  Wt.  (Lbs) 203 204 218 200.12 208.12 208.12 208.12  BMI 34.84 35.02 37.4 34.33 35.71 35.71 35.71   Foot/eye exam completion dates 03/13/2016 06/07/2010  Foot exam Order - yes  Foot Form Completion Done -          At high risk for falls By physical therapy, needs power wheelchair / motorized equipment for safe mobility in the home, script written at visit  Hypothyroidism Controlled, no change in medication   Encounter for medication review and counseling Chronic pain management and new policy starting 66/4403 discussed. Pt will collect 3 months of her chronic pain med staring 08/2016  Hyperlipidemia LDL goal <100 Hyperlipidemia:Low fat diet discussed and encouraged.   Lipid Panel  Lab Results  Component Value Date   CHOL 153 06/12/2016   HDL 84 06/12/2016   LDLCALC 54 06/12/2016   TRIG 75 06/12/2016   CHOLHDL 1.8 06/12/2016   Controlled, no change in medication     Morbid obesity Unchnaged Patient re-educated about  the importance of commitment to a  minimum of 150 minutes of exercise per week.  The importance of healthy food choices with portion control discussed. Encouraged to start a food diary, count calories and to consider  joining a support group. Sample diet sheets offered. Goals set by the patient for the next several months.   Weight /BMI 07/10/2016 03/13/2016 12/10/2015  WEIGHT 203 lb 204 lb 218 lb  HEIGHT 5\' 4"  5\' 4"  5\' 4"   BMI 34.84 kg/m2 35.02 kg/m2 37.4 kg/m2

## 2016-07-11 DIAGNOSIS — Z7189 Other specified counseling: Secondary | ICD-10-CM | POA: Insufficient documentation

## 2016-07-11 NOTE — Assessment & Plan Note (Signed)
Controlled, no change in management   Ms. Kunath is reminded of the importance of commitment to daily physical activity for 30 minutes or more, as able and the need to limit carbohydrate intake to 30 to 60 grams per meal to help with blood sugar control.   Ms. Latendresse is reminded of the importance of daily foot exam, annual eye examination, and good blood sugar, blood pressure and cholesterol control.  Diabetic Labs Latest Ref Rng & Units 06/12/2016 12/10/2015 07/06/2015 11/18/2014 10/13/2014  HbA1c 4.8 - 5.6 % 6.9(H) 7.3 - - 6.9(H)  Microalbumin Not estab mg/dL - - 76.2 - -  Micro/Creat Ratio <30 mcg/mg creat - - 1,859(H) - -  Chol 100 - 199 mg/dL 153 - - - -  HDL >39 mg/dL 84 - - - -  Calc LDL 0 - 99 mg/dL 54 36 - - -  Triglycerides 0 - 149 mg/dL 75 - - - -  Creatinine 0.57 - 1.00 mg/dL 1.25(H) - - 1.84(H) 1.72(H)   BP/Weight 07/10/2016 03/13/2016 12/10/2015 07/06/2015 05/04/2015 03/22/2015 2/35/5732  Systolic BP 202 542 706 237 628 315 176  Diastolic BP 74 80 62 62 62 66 78  Wt. (Lbs) 203 204 218 200.12 208.12 208.12 208.12  BMI 34.84 35.02 37.4 34.33 35.71 35.71 35.71   Foot/eye exam completion dates 03/13/2016 06/07/2010  Foot exam Order - yes  Foot Form Completion Done -

## 2016-07-11 NOTE — Assessment & Plan Note (Signed)
Disbling osteoarthritis, high fall risk, unsteady gait, upper extremity weakness, needs motorized / power wheelchair for safe mobility in the home. Failed physical therapy sessions to improve strength and safety, has had physical therapy eval in August supporting need for power wheelchair/ motorized wheelchair

## 2016-07-11 NOTE — Assessment & Plan Note (Signed)
By physical therapy, needs power wheelchair / motorized equipment for safe mobility in the home, script written at visit

## 2016-07-11 NOTE — Assessment & Plan Note (Signed)
Increased , prednisone dose pack, 10 mg dose , followed by prednisone 10 mg daily, shortest possible duration of maintenance on prednisone due to adverse s/e profile, pt made aware, and understands , though visibly distressed, requesting maintenance on higher dose prednisone

## 2016-07-11 NOTE — Assessment & Plan Note (Signed)
Unchnaged Patient re-educated about  the importance of commitment to a  minimum of 150 minutes of exercise per week.  The importance of healthy food choices with portion control discussed. Encouraged to start a food diary, count calories and to consider  joining a support group. Sample diet sheets offered. Goals set by the patient for the next several months.   Weight /BMI 07/10/2016 03/13/2016 12/10/2015  WEIGHT 203 lb 204 lb 218 lb  HEIGHT 5\' 4"  5\' 4"  5\' 4"   BMI 34.84 kg/m2 35.02 kg/m2 37.4 kg/m2

## 2016-07-11 NOTE — Assessment & Plan Note (Signed)
Chronic pain management and new policy starting 67/6195 discussed. Pt will collect 3 months of her chronic pain med staring 08/2016

## 2016-07-11 NOTE — Assessment & Plan Note (Signed)
Controlled, no change in medication  

## 2016-07-11 NOTE — Assessment & Plan Note (Signed)
Hyperlipidemia:Low fat diet discussed and encouraged.   Lipid Panel  Lab Results  Component Value Date   CHOL 153 06/12/2016   HDL 84 06/12/2016   LDLCALC 54 06/12/2016   TRIG 75 06/12/2016   CHOLHDL 1.8 06/12/2016   Controlled, no change in medication

## 2016-07-14 ENCOUNTER — Other Ambulatory Visit: Payer: Self-pay | Admitting: Family Medicine

## 2016-07-14 DIAGNOSIS — M159 Polyosteoarthritis, unspecified: Secondary | ICD-10-CM

## 2016-07-24 ENCOUNTER — Telehealth: Payer: Self-pay | Admitting: Family Medicine

## 2016-07-24 NOTE — Telephone Encounter (Signed)
Called and left message for patient to return call.  

## 2016-07-24 NOTE — Telephone Encounter (Signed)
Katelyn Rowland is asking for a nurse to return her call, she has medication questions, please advise?

## 2016-07-24 NOTE — Telephone Encounter (Signed)
Patient with questions about new controlled substance policy.  She is now aware that when rx is due in January she will receive 3 scripts.  She will collect next set at April ov.

## 2016-08-04 ENCOUNTER — Other Ambulatory Visit: Payer: Self-pay

## 2016-08-04 MED ORDER — METHADONE HCL 10 MG PO TABS: 10.0000 mg | ORAL_TABLET | Freq: Three times a day (TID) | ORAL | 0 refills | Status: DC

## 2016-09-07 ENCOUNTER — Other Ambulatory Visit: Payer: Self-pay | Admitting: Family Medicine

## 2016-09-20 ENCOUNTER — Other Ambulatory Visit: Payer: Self-pay | Admitting: Family Medicine

## 2016-10-06 ENCOUNTER — Other Ambulatory Visit: Payer: Self-pay | Admitting: Family Medicine

## 2016-10-12 ENCOUNTER — Other Ambulatory Visit: Payer: Self-pay | Admitting: Family Medicine

## 2016-10-13 ENCOUNTER — Other Ambulatory Visit: Payer: Self-pay

## 2016-10-13 MED ORDER — GABAPENTIN 300 MG PO CAPS: 300.0000 mg | ORAL_CAPSULE | Freq: Three times a day (TID) | ORAL | 3 refills | Status: DC

## 2016-10-26 ENCOUNTER — Other Ambulatory Visit: Payer: Self-pay

## 2016-11-08 ENCOUNTER — Encounter: Payer: Self-pay | Admitting: Family Medicine

## 2016-11-08 ENCOUNTER — Ambulatory Visit (INDEPENDENT_AMBULATORY_CARE_PROVIDER_SITE_OTHER): Payer: Medicare Other | Admitting: Family Medicine

## 2016-11-08 VITALS — BP 116/72 | HR 84 | Temp 97.4°F | Resp 18 | Ht 64.0 in | Wt 193.0 lb

## 2016-11-08 DIAGNOSIS — E559 Vitamin D deficiency, unspecified: Secondary | ICD-10-CM

## 2016-11-08 DIAGNOSIS — I10 Essential (primary) hypertension: Secondary | ICD-10-CM | POA: Diagnosis not present

## 2016-11-08 DIAGNOSIS — M1990 Unspecified osteoarthritis, unspecified site: Secondary | ICD-10-CM | POA: Diagnosis not present

## 2016-11-08 DIAGNOSIS — R634 Abnormal weight loss: Secondary | ICD-10-CM

## 2016-11-08 DIAGNOSIS — Z1211 Encounter for screening for malignant neoplasm of colon: Secondary | ICD-10-CM

## 2016-11-08 DIAGNOSIS — E669 Obesity, unspecified: Secondary | ICD-10-CM

## 2016-11-08 DIAGNOSIS — E785 Hyperlipidemia, unspecified: Secondary | ICD-10-CM | POA: Diagnosis not present

## 2016-11-08 DIAGNOSIS — E038 Other specified hypothyroidism: Secondary | ICD-10-CM | POA: Diagnosis not present

## 2016-11-08 DIAGNOSIS — E1169 Type 2 diabetes mellitus with other specified complication: Secondary | ICD-10-CM | POA: Diagnosis not present

## 2016-11-08 DIAGNOSIS — R52 Pain, unspecified: Secondary | ICD-10-CM

## 2016-11-08 DIAGNOSIS — G8929 Other chronic pain: Secondary | ICD-10-CM

## 2016-11-08 LAB — POC HEMOCCULT BLD/STL (OFFICE/1-CARD/DIAGNOSTIC): Fecal Occult Blood, POC: NEGATIVE

## 2016-11-08 MED ORDER — PREDNISONE 10 MG PO TABS: 10.0000 mg | ORAL_TABLET | Freq: Two times a day (BID) | ORAL | 0 refills | Status: AC

## 2016-11-08 MED ORDER — METHADONE HCL 10 MG PO TABS: 10.0000 mg | ORAL_TABLET | Freq: Three times a day (TID) | ORAL | 0 refills | Status: DC

## 2016-11-08 NOTE — Patient Instructions (Addendum)
f/u pain management June 26 call if you need me sooner  You are referred to rheumatologist  Prednisone sent in for next 2 weeks twice daily dose then once daily for an additional 2 weeks  No hidden blood in stool  We will check on chair that you need  Thank you  for choosing [facility]. We consider it a privelige to serve you.  Delivering excellent health care in a caring and  compassionate way is our goal.  Partnering with you,  so that together we can achieve this goal is our strategy.

## 2016-11-12 ENCOUNTER — Encounter: Payer: Self-pay | Admitting: Family Medicine

## 2016-11-12 DIAGNOSIS — R634 Abnormal weight loss: Secondary | ICD-10-CM | POA: Insufficient documentation

## 2016-11-12 DIAGNOSIS — M1990 Unspecified osteoarthritis, unspecified site: Secondary | ICD-10-CM | POA: Insufficient documentation

## 2016-11-12 NOTE — Assessment & Plan Note (Signed)
Hyperlipidemia:Low fat diet discussed and encouraged.   Lipid Panel  Lab Results  Component Value Date   CHOL 153 06/12/2016   HDL 84 06/12/2016   LDLCALC 54 06/12/2016   TRIG 75 06/12/2016   CHOLHDL 1.8 06/12/2016

## 2016-11-12 NOTE — Assessment & Plan Note (Signed)
Reports increased pain , debility and poor level of function.Reports response to oral prednisone. 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

## 2016-11-12 NOTE — Progress Notes (Signed)
Katelyn Rowland     MRN: [id number]      DOB: Feb 05, 1941   HPI Katelyn Rowland is here for follow up and re-evaluation of chronic medical conditions, medication management and review of any available recent lab and radiology data.  Preventive health is updated, specifically  Cancer screening and Immunization.  Refuses screening and immunizations Questions or concerns regarding consultations or procedures which the PT has had in the interim are  addressed. The PT denies any adverse reactions to current medications since the last visit.  c/o early satiety and poor appetite, attributes this to uncontrolled generalized  pain, still refusing GI eval, however agrees to rectal exam which is negative for blood in stool, also describes change in stool caliber, states will think about GI eval,  c/o increased and not well controlled generalized pain and muscle tenderness. Requesting chronic prednisone as this has helped in the past. Needs rheumatology assessment , as may well have polymyalgia Still awaiting PWC, will follow up on this Here for re evaluation of chronic arthritc pain, registry reviewed and she is compliant, unfortunately pain is inadequately controlled, rheumatology eval warranted, holding off on pain referral until that is done   ROS See HPI   Denies recent fever or chills. Denies sinus pressure, nasal congestion, ear pain or sore throat. Denies chest congestion, productive cough or wheezing. Denies chest pains, palpitations and leg swelling Denies abdominal pain, nausea, vomiting,diarrhea or constipation.   Denies dysuria, frequency, hesitancy or incontinence.  Denies headaches, seizures,  Denies depression, anxiety or insomnia. Denies skin break down or rash.   PE  BP 116/72 (BP Location: Left Arm, Patient Position: Sitting, Cuff Size: Normal)   Pulse 84   Temp 97.4 F (36.3 C) (Temporal)   Resp 18   Ht 5\' 4"  (1.626 m)   Wt 193 lb (87.5 kg)   SpO2 93%   BMI 33.13 kg/m    Patient alert and oriented and in no cardiopulmonary distress.  HEENT: No facial asymmetry, EOMI,   oropharynx pink and moist.  Neck supple no JVD, no mass.  Chest: Clear to auscultation bilaterally.  CVS: S1, S2 no murmurs, no S3.Regular rate.  ABD: Soft non tender.  Rectal: no mass, heme negative stool  Ext: No edema  DE:[id number]  ROM spine, shoulders, hips and knees.Multiple tender points  on exam  Skin: Intact, no ulcerations or rash noted.  Psych: Good eye contact, normal affect. Memory intact not anxious or depressed appearing.  CNS: CN 2-12 intact, power,  normal throughout.no focal deficits noted.   Assessment & Plan  Encounter for chronic pain management Pain uncontrolled with limitation in function. Multiple tender areas, may have underlying rheumatological disease, and is refered for eval Her [place] pain registry reviewed and she is compliant. Will hold on pain management referral until she has rheumatology evaluation Three months of medication prescribed with appropriate follow up  Hypothyroidism Updated lab needed at/ before next visit.   Hyperlipidemia LDL goal <100 Hyperlipidemia:Low fat diet discussed and encouraged.   Lipid Panel  Lab Results  Component Value Date   CHOL 153 06/12/2016   HDL 84 06/12/2016   LDLCALC 54 06/12/2016   TRIG 75 06/12/2016   CHOLHDL 1.8 06/12/2016       Generalized pain Reports increased pain , debility and poor level of function.Reports response to oral prednisone. 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   The need to take  medication as prescribed, test blood sugar as directed, and to call between visits if there is a concern that blood sugar is uncontrolled is also discussed.   Katelyn Rowland is reminded of the importance of daily foot exam, annual eye examination, and good blood sugar, blood pressure and cholesterol control. Updated lab needed at/ before next visit.   Diabetic Labs Latest Ref Rng & Units 06/12/2016 12/10/2015 07/06/2015 11/18/2014 10/13/2014  HbA1c 4.8 - 5.6 % 6.9(H) 7.3 - - 6.9(H)  Microalbumin Not estab mg/dL - - 76.2 - -  Micro/Creat Ratio <30 mcg/mg creat - - 1,859(H) - -  Chol 100 - 199 mg/dL 153 - - - -  HDL >39 mg/dL 84 - - - -  Calc LDL 0 - 99 mg/dL 54 36 - - -  Triglycerides 0 - 149 mg/dL 75 - - - -  Creatinine 0.57 - 1.00 mg/dL 1.25(H) - - 1.84(H) 1.72(H)   BP/Weight 11/08/2016 07/10/2016 03/13/2016 12/10/2015 07/06/2015 05/04/2015 9/45/0388  Systolic BP 828 003 491 791 505 697 948  Diastolic BP 72 74 80 62 62 62 66  Wt. (Lbs) 193 203 204 218 200.12 208.12 208.12  BMI 33.13 34.84 35.02 37.4 34.33 35.71 35.71   Foot/eye exam completion dates 03/13/2016 06/07/2010  Foot exam Order - yes  Foot Form Completion Done -        Unintentional weight loss Reports early satiety and change in stool caliber, needs, but is refusing GI eval. Stool test at visit , heme negative

## 2016-11-12 NOTE — Assessment & Plan Note (Signed)
Pain uncontrolled with limitation in function. Multiple tender areas, may have underlying rheumatological disease, and is refered for eval Her [place] pain registry reviewed and she is compliant. Will hold on pain management referral until she has rheumatology evaluation Three months of medication prescribed with appropriate follow up

## 2016-11-12 NOTE — Assessment & Plan Note (Signed)
[name] is reminded of the importance of commitment to daily physical activity for 30 minutes or more, as able and the need to limit carbohydrate intake to 30 to 60 grams per meal to help with blood sugar control.   The need to take medication as prescribed, test blood sugar as directed, and to call between visits if there is a concern that blood sugar is uncontrolled is also discussed.   [name] is reminded of the importance of daily foot exam, annual eye examination, and good blood sugar, blood pressure and cholesterol control. Updated lab needed at/ before next visit.   Diabetic Labs Latest Ref Rng & Units 06/12/2016 12/10/2015 07/06/2015 11/18/2014 10/13/2014  HbA1c 4.8 - 5.6 % 6.9(H) 7.3 - - 6.9(H)  Microalbumin Not estab mg/dL - - 76.2 - -  Micro/Creat Ratio <30 mcg/mg creat - - 1,859(H) - -  Chol 100 - 199 mg/dL 153 - - - -  HDL >39 mg/dL 84 - - - -  Calc LDL 0 - 99 mg/dL 54 36 - - -  Triglycerides 0 - 149 mg/dL 75 - - - -  Creatinine 0.57 - 1.00 mg/dL 1.25(H) - - 1.84(H) 1.72(H)   BP/Weight 11/08/2016 07/10/2016 03/13/2016 12/10/2015 07/06/2015 05/04/2015 2/82/0601  Systolic BP 561 537 943 276 147 092 957  Diastolic BP 72 74 80 62 62 62 66  Wt. (Lbs) 193 203 204 218 200.12 208.12 208.12  BMI 33.13 34.84 35.02 37.4 34.33 35.71 35.71   Foot/eye exam completion dates 03/13/2016 06/07/2010  Foot exam Order - yes  Foot Form Completion Done -

## 2016-11-12 NOTE — Assessment & Plan Note (Signed)
Reports early satiety and change in stool caliber, needs, but is refusing GI eval. Stool test at visit , heme negative

## 2016-11-12 NOTE — Assessment & Plan Note (Signed)
Updated lab needed at/ before next visit.   

## 2016-11-15 ENCOUNTER — Other Ambulatory Visit: Payer: Self-pay

## 2016-11-15 MED ORDER — METHADONE HCL 10 MG PO TABS: 10.0000 mg | ORAL_TABLET | Freq: Three times a day (TID) | ORAL | 0 refills | Status: DC

## 2016-11-17 ENCOUNTER — Other Ambulatory Visit: Payer: Self-pay | Admitting: Family Medicine

## 2016-12-01 DIAGNOSIS — N183 Chronic kidney disease, stage 3 (moderate): Secondary | ICD-10-CM | POA: Diagnosis not present

## 2016-12-01 DIAGNOSIS — E559 Vitamin D deficiency, unspecified: Secondary | ICD-10-CM | POA: Diagnosis not present

## 2016-12-01 DIAGNOSIS — Z79899 Other long term (current) drug therapy: Secondary | ICD-10-CM | POA: Diagnosis not present

## 2016-12-01 DIAGNOSIS — E785 Hyperlipidemia, unspecified: Secondary | ICD-10-CM | POA: Diagnosis not present

## 2016-12-01 DIAGNOSIS — R809 Proteinuria, unspecified: Secondary | ICD-10-CM | POA: Diagnosis not present

## 2016-12-01 DIAGNOSIS — E669 Obesity, unspecified: Secondary | ICD-10-CM | POA: Diagnosis not present

## 2016-12-01 DIAGNOSIS — I10 Essential (primary) hypertension: Secondary | ICD-10-CM | POA: Diagnosis not present

## 2016-12-01 DIAGNOSIS — D509 Iron deficiency anemia, unspecified: Secondary | ICD-10-CM | POA: Diagnosis not present

## 2016-12-01 DIAGNOSIS — E038 Other specified hypothyroidism: Secondary | ICD-10-CM | POA: Diagnosis not present

## 2016-12-01 DIAGNOSIS — E1169 Type 2 diabetes mellitus with other specified complication: Secondary | ICD-10-CM | POA: Diagnosis not present

## 2016-12-02 LAB — TSH: TSH: 3.53 u[IU]/mL (ref 0.450–4.500)

## 2016-12-03 LAB — COMPREHENSIVE METABOLIC PANEL
ALT: 20 IU/L (ref 0–32)
AST: 22 IU/L (ref 0–40)
Albumin/Globulin Ratio: 1.5 (ref 1.2–2.2)
Albumin: 4 g/dL (ref 3.5–4.8)
Alkaline Phosphatase: 61 IU/L (ref 39–117)
BUN/Creatinine Ratio: 10 — ABNORMAL LOW (ref 12–28)
BUN: 15 mg/dL (ref 8–27)
Bilirubin Total: 0.3 mg/dL (ref 0.0–1.2)
CALCIUM: 9.3 mg/dL (ref 8.7–10.3)
CO2: 27 mmol/L (ref 18–29)
CREATININE: 1.52 mg/dL — AB (ref 0.57–1.00)
Chloride: 98 mmol/L (ref 96–106)
GFR calc Af Amer: 38 mL/min/{1.73_m2} — ABNORMAL LOW (ref >59)
GFR, EST NON AFRICAN AMERICAN: 33 mL/min/{1.73_m2} — AB (ref >59)
GLOBULIN, TOTAL: 2.6 g/dL (ref 1.5–4.5)
Glucose: 142 mg/dL — ABNORMAL HIGH (ref 65–99)
Potassium: 5.1 mmol/L (ref 3.5–5.2)
SODIUM: 140 mmol/L (ref 134–144)
TOTAL PROTEIN: 6.6 g/dL (ref 6.0–8.5)

## 2016-12-03 LAB — VITAMIN D 25 HYDROXY (VIT D DEFICIENCY, FRACTURES): Vit D, 25-Hydroxy: 69.8 ng/mL (ref 30.0–100.0)

## 2016-12-03 LAB — LIPID PANEL
CHOL/HDL RATIO: 2.8 ratio (ref 0.0–4.4)
Cholesterol, Total: 119 mg/dL (ref 100–199)
HDL: 42 mg/dL (ref >39)
LDL Calculated: 45 mg/dL (ref 0–99)
Triglycerides: 162 mg/dL — ABNORMAL HIGH (ref 0–149)
VLDL Cholesterol Cal: 32 mg/dL (ref 5–40)

## 2016-12-03 LAB — HEMOGLOBIN A1C
Est. average glucose Bld gHb Est-mCnc: 140 mg/dL
HEMOGLOBIN A1C: 6.5 % — AB (ref 4.8–5.6)

## 2016-12-15 ENCOUNTER — Other Ambulatory Visit: Payer: Self-pay | Admitting: Family Medicine

## 2016-12-15 NOTE — Telephone Encounter (Signed)
Faiths vit d level was 69 on 4 29 18   Do you want to renew this?

## 2016-12-20 ENCOUNTER — Telehealth: Payer: Self-pay | Admitting: Family Medicine

## 2016-12-20 DIAGNOSIS — E669 Obesity, unspecified: Secondary | ICD-10-CM | POA: Diagnosis not present

## 2016-12-20 DIAGNOSIS — R809 Proteinuria, unspecified: Secondary | ICD-10-CM | POA: Diagnosis not present

## 2016-12-20 DIAGNOSIS — N183 Chronic kidney disease, stage 3 (moderate): Secondary | ICD-10-CM | POA: Diagnosis not present

## 2016-12-20 NOTE — Telephone Encounter (Signed)
spoke with tyler at [facility] and her rx quantity was wrong so he will fill her last one when she runs out of current one so her last rx will run out around June 20th. Will schedule around June 14 when she calls back

## 2016-12-20 NOTE — Telephone Encounter (Signed)
Patient calling to leave a message for Brandi.  She states there is a problem with her medication and will require you to call and speak with [facility] @ [employer].  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 She has appt 26th of June with Dr. Moshe Cipro.  She will be able to talk with you until 1. Or after 4pm today.

## 2016-12-22 NOTE — Telephone Encounter (Signed)
Earlier appt scheduled

## 2016-12-29 ENCOUNTER — Other Ambulatory Visit: Payer: Self-pay | Admitting: Family Medicine

## 2016-12-29 DIAGNOSIS — M159 Polyosteoarthritis, unspecified: Secondary | ICD-10-CM

## 2017-01-09 ENCOUNTER — Other Ambulatory Visit: Payer: Self-pay | Admitting: Family Medicine

## 2017-01-10 MED ORDER — ROSUVASTATIN CALCIUM 20 MG PO TABS: 20.0000 mg | ORAL_TABLET | Freq: Every day | ORAL | 1 refills | Status: DC

## 2017-01-15 ENCOUNTER — Other Ambulatory Visit: Payer: Self-pay | Admitting: Family Medicine

## 2017-01-17 ENCOUNTER — Encounter: Payer: Self-pay | Admitting: Family Medicine

## 2017-01-17 ENCOUNTER — Ambulatory Visit (INDEPENDENT_AMBULATORY_CARE_PROVIDER_SITE_OTHER): Payer: Medicare Other | Admitting: Family Medicine

## 2017-01-17 VITALS — BP 114/62 | HR 70 | Resp 16 | Ht 64.0 in | Wt 187.0 lb

## 2017-01-17 DIAGNOSIS — I10 Essential (primary) hypertension: Secondary | ICD-10-CM | POA: Diagnosis not present

## 2017-01-17 DIAGNOSIS — E038 Other specified hypothyroidism: Secondary | ICD-10-CM | POA: Diagnosis not present

## 2017-01-17 DIAGNOSIS — G8929 Other chronic pain: Secondary | ICD-10-CM | POA: Diagnosis not present

## 2017-01-17 DIAGNOSIS — E559 Vitamin D deficiency, unspecified: Secondary | ICD-10-CM

## 2017-01-17 MED ORDER — METHADONE HCL 10 MG PO TABS: 10.0000 mg | ORAL_TABLET | Freq: Three times a day (TID) | ORAL | 0 refills | Status: DC

## 2017-01-17 MED ORDER — ROSUVASTATIN CALCIUM 20 MG PO TABS: 20.0000 mg | ORAL_TABLET | Freq: Every day | ORAL | 1 refills | Status: DC

## 2017-01-17 NOTE — Patient Instructions (Addendum)
F/u week of September 4, call if you need me before   Fasting lipid, tSH, cBC, vit D and cmp 1 week before visit  Please work on good  health habits so that your health will improve. 1. Commitment to daily physical activity for 30 to 60  minutes, if you are able to do this.  2. Commitment to wise food choices. Aim for half of your  food intake to be vegetable and fruit, one quarter starchy foods, and one quarter protein. Try to eat on a regular schedule  3 meals per day, snacking between meals should be limited to vegetables or fruits or small portions of nuts. 64 ounces of water per day is generally recommended, unless you have specific health conditions, like heart failure or kidney failure where you will need to limit fluid intake.  3. Commitment to sufficient and a  good quality of physical and mental rest daily, generally between 6 to 8 hours per day.  WITH PERSISTANCE AND PERSEVERANCE, THE IMPOSSIBLE , BECOMES THE NORM! Thank you  for choosing  Primary Care. We consider it a privelige to serve you.  Delivering excellent health care in a caring and  compassionate way is our goal.  Partnering with you,  so that together we can achieve this goal is our strategy.

## 2017-01-17 NOTE — Assessment & Plan Note (Addendum)
Reports adequate pain management with reasonably good level of function Narcotic registry reviewed  And pt is compliant with contract 12  Week prescription provided with appropriate f/u aranged

## 2017-01-18 NOTE — Progress Notes (Signed)
   Katelyn Rowland     MRN: [id number]      DOB: 29-Feb-1941   HPI Katelyn Rowland is here for follow up of chronic pain management. He reports good pain control and level of function with her current regime States bowel movements are now regular with adjusting dose of her miralax. Following a vegetable rich diet her weigh is well controlled also She denies adverse s/e from her medication Very happy that she has finally got her motorized chair at her home  ROS Denies recent fever or chills. Denies sinus pressure, nasal congestion, ear pain or sore throat. Denies chest congestion, productive cough or wheezing. Denies chest pains, palpitations and leg swelling Denies abdominal pain, nausea, vomiting,diarrhea or constipation.   Denies dysuria, frequency, hesitancy or incontinence.    PE  BP 114/62   Pulse 70   Resp 16   Ht 5\' 4"  (1.626 m)   Wt 187 lb (84.8 kg)   SpO2 93%   BMI 32.10 kg/m   Patient alert and oriented and in no cardiopulmonary distress.  HEENT: No facial asymmetry, EOMI,   oropharynx pink and moist.  Neck decreased ROM no JVD, no mass.  Chest: Clear to auscultation bilaterally.  CVS: S1, S2 no murmurs, no S3.Regular rate.  ABD: Soft non tender.   Ext: No edema  MS: Adequate though reduced  ROM spine, shoulders, hips and knees.   Psych: Good eye contact, normal affect. Memory intact not anxious or depressed appearing.  CNS: CN 2-12 intact,   Assessment & Plan  Encounter for chronic pain management Reports adequate pain management with reasonably good level of function Narcotic registry reviewed  And pt is compliant with contract 12  Week prescription provided with appropriate f/u aranged  Essential hypertension Controlled, no change in medication DASH diet and commitment to daily physical activity for a minimum of 30 minutes discussed and encouraged, as a part of hypertension management. The importance of attaining a healthy weight is also  discussed.  BP/Weight 01/17/2017 11/08/2016 07/10/2016 03/13/2016 12/10/2015 07/06/2015 1/73/5670  Systolic BP 141 030 131 438 887 579 728  Diastolic BP 62 72 74 80 62 62 62  Wt. (Lbs) 187 193 203 204 218 200.12 208.12  BMI 32.1 33.13 34.84 35.02 37.4 34.33 35.71

## 2017-01-18 NOTE — Assessment & Plan Note (Signed)
Controlled, no change in medication DASH diet and commitment to daily physical activity for a minimum of 30 minutes discussed and encouraged, as a part of hypertension management. The importance of attaining a healthy weight is also discussed.  BP/Weight 01/17/2017 11/08/2016 07/10/2016 03/13/2016 12/10/2015 07/06/2015 09/12/154  Systolic BP 153 794 327 614 709 295 747  Diastolic BP 62 72 74 80 62 62 62  Wt. (Lbs) 187 193 203 204 218 200.12 208.12  BMI 32.1 33.13 34.84 35.02 37.4 34.33 35.71

## 2017-01-30 ENCOUNTER — Ambulatory Visit: Payer: Medicare Other | Admitting: Family Medicine

## 2017-03-20 ENCOUNTER — Other Ambulatory Visit: Payer: Self-pay | Admitting: Family Medicine

## 2017-04-04 DIAGNOSIS — I10 Essential (primary) hypertension: Secondary | ICD-10-CM | POA: Diagnosis not present

## 2017-04-04 DIAGNOSIS — E559 Vitamin D deficiency, unspecified: Secondary | ICD-10-CM | POA: Diagnosis not present

## 2017-04-05 LAB — CBC
HEMATOCRIT: 41.8 % (ref 34.0–46.6)
HEMOGLOBIN: 13.9 g/dL (ref 11.1–15.9)
MCH: 31.7 pg (ref 26.6–33.0)
MCHC: 33.3 g/dL (ref 31.5–35.7)
MCV: 95 fL (ref 79–97)
Platelets: 194 10*3/uL (ref 150–379)
RBC: 4.38 x10E6/uL (ref 3.77–5.28)
RDW: 13.9 % (ref 12.3–15.4)
WBC: 5.7 10*3/uL (ref 3.4–10.8)

## 2017-04-05 LAB — COMPREHENSIVE METABOLIC PANEL
ALBUMIN: 4.6 g/dL (ref 3.5–4.8)
ALK PHOS: 66 IU/L (ref 39–117)
ALT: 13 IU/L (ref 0–32)
AST: 19 IU/L (ref 0–40)
Albumin/Globulin Ratio: 1.8 (ref 1.2–2.2)
BILIRUBIN TOTAL: 0.4 mg/dL (ref 0.0–1.2)
BUN / CREAT RATIO: 15 (ref 12–28)
BUN: 23 mg/dL (ref 8–27)
CHLORIDE: 98 mmol/L (ref 96–106)
CO2: 22 mmol/L (ref 20–29)
CREATININE: 1.57 mg/dL — AB (ref 0.57–1.00)
Calcium: 9.4 mg/dL (ref 8.7–10.3)
GFR calc Af Amer: 36 mL/min/{1.73_m2} — ABNORMAL LOW (ref >59)
GFR calc non Af Amer: 32 mL/min/{1.73_m2} — ABNORMAL LOW (ref >59)
GLUCOSE: 101 mg/dL — AB (ref 65–99)
Globulin, Total: 2.6 g/dL (ref 1.5–4.5)
Potassium: 4.3 mmol/L (ref 3.5–5.2)
Sodium: 139 mmol/L (ref 134–144)
Total Protein: 7.2 g/dL (ref 6.0–8.5)

## 2017-04-05 LAB — LIPID PANEL
Chol/HDL Ratio: 2.3 ratio (ref 0.0–4.4)
Cholesterol, Total: 121 mg/dL (ref 100–199)
HDL: 53 mg/dL (ref >39)
LDL Calculated: 47 mg/dL (ref 0–99)
Triglycerides: 106 mg/dL (ref 0–149)
VLDL Cholesterol Cal: 21 mg/dL (ref 5–40)

## 2017-04-05 LAB — VITAMIN D 25 HYDROXY (VIT D DEFICIENCY, FRACTURES): VIT D 25 HYDROXY: 44.3 ng/mL (ref 30.0–100.0)

## 2017-04-05 LAB — TSH: TSH: 1.74 u[IU]/mL (ref 0.450–4.500)

## 2017-04-06 ENCOUNTER — Other Ambulatory Visit: Payer: Self-pay | Admitting: Family Medicine

## 2017-04-11 ENCOUNTER — Telehealth: Payer: Self-pay | Admitting: Family Medicine

## 2017-04-11 ENCOUNTER — Ambulatory Visit (INDEPENDENT_AMBULATORY_CARE_PROVIDER_SITE_OTHER): Payer: Medicare Other | Admitting: Family Medicine

## 2017-04-11 ENCOUNTER — Encounter: Payer: Self-pay | Admitting: Family Medicine

## 2017-04-11 VITALS — BP 130/70 | HR 92 | Temp 97.0°F | Resp 15 | Ht 62.0 in | Wt 183.5 lb

## 2017-04-11 DIAGNOSIS — G8929 Other chronic pain: Secondary | ICD-10-CM | POA: Diagnosis not present

## 2017-04-11 DIAGNOSIS — E1169 Type 2 diabetes mellitus with other specified complication: Secondary | ICD-10-CM

## 2017-04-11 DIAGNOSIS — E038 Other specified hypothyroidism: Secondary | ICD-10-CM | POA: Diagnosis not present

## 2017-04-11 DIAGNOSIS — E785 Hyperlipidemia, unspecified: Secondary | ICD-10-CM

## 2017-04-11 DIAGNOSIS — I10 Essential (primary) hypertension: Secondary | ICD-10-CM

## 2017-04-11 DIAGNOSIS — E669 Obesity, unspecified: Secondary | ICD-10-CM

## 2017-04-11 MED ORDER — METHADONE HCL 10 MG PO TABS: 10.0000 mg | ORAL_TABLET | Freq: Three times a day (TID) | ORAL | 0 refills | Status: DC

## 2017-04-11 NOTE — Patient Instructions (Signed)
F/u in 11.5 weeks , call if you need me in before   Excellent labs , keep up the great work   No medication changes  Thank you  for choosing [facility]. We consider it a privelige to serve you.  Delivering excellent health care in a caring and  compassionate way is our goal.  Partnering with you,  so that together we can achieve this goal is our strategy.

## 2017-04-11 NOTE — Telephone Encounter (Signed)
Unable to add a1c to 04/04/17 labs- blood was discarded already.

## 2017-04-11 NOTE — Telephone Encounter (Signed)
Noted, will get at next visit

## 2017-04-11 NOTE — Telephone Encounter (Signed)
Patient asks if prednisone can be called in to cover any gaps in getting pain medication.

## 2017-04-12 ENCOUNTER — Other Ambulatory Visit (HOSPITAL_COMMUNITY)
Admission: RE | Admit: 2017-04-12 | Discharge: 2017-04-12 | Disposition: A | Discharge status: Home / Self Care | Payer: Medicare Other | Source: Skilled Nursing Facility | Attending: Family Medicine | Admitting: Family Medicine

## 2017-04-12 DIAGNOSIS — I10 Essential (primary) hypertension: Secondary | ICD-10-CM | POA: Diagnosis not present

## 2017-04-13 ENCOUNTER — Other Ambulatory Visit: Payer: Self-pay | Admitting: Family Medicine

## 2017-04-13 LAB — MICROALBUMIN / CREATININE URINE RATIO
Creatinine, Urine: 87.9 mg/dL
Microalb Creat Ratio: 555.1 mg/g creat — ABNORMAL HIGH (ref 0.0–30.0)
Microalb, Ur: 487.9 ug/mL — ABNORMAL HIGH

## 2017-04-13 MED ORDER — PREDNISONE 5 MG (21) PO TBPK: 5.0000 mg | ORAL_TABLET | ORAL | 0 refills | Status: DC

## 2017-04-13 NOTE — Telephone Encounter (Signed)
Patient informed of message below, verbalized understanding.  

## 2017-04-13 NOTE — Telephone Encounter (Signed)
Please let her know that I have sent in a dose pack of prednisone to [facility]

## 2017-04-22 NOTE — Progress Notes (Signed)
Katelyn Rowland     MRN: [id number]      DOB: [birth year]   HPI Katelyn Rowland is here for follow up and re-evaluation of chronic medical conditions, medication management and review of any available recent lab and radiology data.  Preventive health is updated, specifically  Cancer screening and Immunization.   Questions or concerns regarding consultations or procedures which the PT has had in the interim are  addressed. The PT denies any adverse reactions to current medications since the last visit.  There are no new concerns.  There are no specific complaints   ROS Denies recent fever or chills. Denies sinus pressure, nasal congestion, ear pain or sore throat. Denies chest congestion, productive cough or wheezing. Denies chest pains, palpitations and leg swelling Denies abdominal pain, nausea, vomiting,diarrhea or constipation.   Denies dysuria, frequency, hesitancy or incontinence. Denies uncontrolled  joint pain, swelling and she does have  limitation in mobility. Denies headaches, seizures, numbness, or tingling. Denies depression, anxiety or insomnia. Denies skin break down or rash. Requests pernione by mouth to take in between visits for flare of joint pain   PE  BP 130/70   Pulse 92   Temp (!) 97 F (36.1 C) (Other (Comment))   Resp 15   Ht 5\' 2"  (1.575 m)   Wt 183 lb 8 oz (83.2 kg)   SpO2 95%   BMI 33.56 kg/m   Patient alert and oriented and in no cardiopulmonary distress.  HEENT: No facial asymmetry, EOMI,   oropharynx pink and moist.  Neck supple no JVD, no mass.  Chest: Clear to auscultation bilaterally.  CVS: S1, S2 no murmurs, no S3.Regular rate.  ABD: Soft non tender.   Ext: No edema  MS: Decreased  ROM spine, shoulders, hips and knees.  Skin: Intact, no ulcerations or rash noted.  Psych: Good eye contact, normal affect. Memory intact not anxious or depressed appearing.  CNS: CN 2-12 intact, power,  normal throughout.no focal deficits  noted.   Assessment & Plan Encounter for chronic pain management The patient's Controlled Substance registry is reviewed and compliance confirmed. Adequacy of  Pain control and level of function is assessed. Medication dosing is adjusted as deemed appropriate. Twelve weeks of medication is prescribed , patient signs for the script and is provided with a follow up appointment between 11 to 12 weeks .   Essential hypertension Controlled, no change in medication DASH diet and commitment to daily physical activity for a minimum of 30 minutes discussed and encouraged, as a part of hypertension management. The importance of attaining a healthy weight is also discussed.  BP/Weight 04/11/2017 01/17/2017 11/08/2016 07/10/2016 03/13/2016 12/10/2015 85/27/7824  Systolic BP 235 361 443 154 008 676 195  Diastolic BP 70 62 72 74 80 62 62  Wt. (Lbs) 183.5 187 193 203 204 218 200.12  BMI 33.56 32.1 33.13 34.84 35.02 37.4 34.33       Hyperlipidemia LDL goal <100 Hyperlipidemia:Low fat diet discussed and encouraged.   Lipid Panel  Lab Results  Component Value Date   CHOL 121 04/04/2017   HDL 53 04/04/2017   LDLCALC 47 04/04/2017   TRIG 106 04/04/2017   CHOLHDL 2.3 04/04/2017   Controlled, no change in medication     Hypothyroidism Controlled, no change in medication   Diabetes mellitus type 2 in obese [facility]) Katelyn Rowland is reminded of the importance of commitment to daily physical activity for 30 minutes or more, as able and the need to limit carbohydrate  intake to 30 to 60 grams per meal to help with blood sugar control.   Katelyn Rowland is reminded of the importance of daily foot exam, annual eye examination, and good blood sugar, blood pressure and cholesterol control.  Diabetic Labs Latest Ref Rng & Units 04/12/2017 04/04/2017 12/01/2016 06/12/2016 12/10/2015  HbA1c 4.8 - 5.6 % - - 6.5(H) 6.9(H) 7.3  Microalbumin Not Estab. ug/mL 487.9(H) - - - -  Micro/Creat Ratio 0.0 - 30.0 mg/g creat 555.1(H)  - - - -  Chol 100 - 199 mg/dL - 121 119 153 -  HDL >39 mg/dL - 53 42 84 -  Calc LDL 0 - 99 mg/dL - 47 45 54 36  Triglycerides 0 - 149 mg/dL - 106 162(H) 75 -  Creatinine 0.57 - 1.00 mg/dL - 1.57(H) 1.52(H) 1.25(H) -   BP/Weight 04/11/2017 01/17/2017 11/08/2016 07/10/2016 03/13/2016 12/10/2015 00/63/4949  Systolic BP 447 395 844 171 278 718 367  Diastolic BP 70 62 72 74 80 62 62  Wt. (Lbs) 183.5 187 193 203 204 218 200.12  BMI 33.56 32.1 33.13 34.84 35.02 37.4 34.33   Foot/eye exam completion dates 04/11/2017 03/13/2016  Foot exam Order - -  Foot Form Completion Done Done

## 2017-04-22 NOTE — Assessment & Plan Note (Signed)
Controlled, no change in medication DASH diet and commitment to daily physical activity for a minimum of 30 minutes discussed and encouraged, as a part of hypertension management. The importance of attaining a healthy weight is also discussed.  BP/Weight 04/11/2017 01/17/2017 11/08/2016 07/10/2016 03/13/2016 12/10/2015 20/94/7096  Systolic BP 283 662 947 654 650 354 656  Diastolic BP 70 62 72 74 80 62 62  Wt. (Lbs) 183.5 187 193 203 204 218 200.12  BMI 33.56 32.1 33.13 34.84 35.02 37.4 34.33

## 2017-04-22 NOTE — Assessment & Plan Note (Signed)
Katelyn Rowland is reminded of the importance of commitment to daily physical activity for 30 minutes or more, as able and the need to limit carbohydrate intake to 30 to 60 grams per meal to help with blood sugar control.   Katelyn Rowland is reminded of the importance of daily foot exam, annual eye examination, and good blood sugar, blood pressure and cholesterol control.  Diabetic Labs Latest Ref Rng & Units 04/12/2017 04/04/2017 12/01/2016 06/12/2016 12/10/2015  HbA1c 4.8 - 5.6 % - - 6.5(H) 6.9(H) 7.3  Microalbumin Not Estab. ug/mL 487.9(H) - - - -  Micro/Creat Ratio 0.0 - 30.0 mg/g creat 555.1(H) - - - -  Chol 100 - 199 mg/dL - 121 119 153 -  HDL >39 mg/dL - 53 42 84 -  Calc LDL 0 - 99 mg/dL - 47 45 54 36  Triglycerides 0 - 149 mg/dL - 106 162(H) 75 -  Creatinine 0.57 - 1.00 mg/dL - 1.57(H) 1.52(H) 1.25(H) -   BP/Weight 04/11/2017 01/17/2017 11/08/2016 07/10/2016 03/13/2016 12/10/2015 77/41/4239  Systolic BP 532 023 343 568 616 837 290  Diastolic BP 70 62 72 74 80 62 62  Wt. (Lbs) 183.5 187 193 203 204 218 200.12  BMI 33.56 32.1 33.13 34.84 35.02 37.4 34.33   Foot/eye exam completion dates 04/11/2017 03/13/2016  Foot exam Order - -  Foot Form Completion Done Done

## 2017-04-22 NOTE — Assessment & Plan Note (Signed)
Hyperlipidemia:Low fat diet discussed and encouraged.   Lipid Panel  Lab Results  Component Value Date   CHOL 121 04/04/2017   HDL 53 04/04/2017   LDLCALC 47 04/04/2017   TRIG 106 04/04/2017   CHOLHDL 2.3 04/04/2017   Controlled, no change in medication

## 2017-04-22 NOTE — Assessment & Plan Note (Signed)
Controlled, no change in medication  

## 2017-04-22 NOTE — Assessment & Plan Note (Signed)
The patient's Controlled Substance registry is reviewed and compliance confirmed. Adequacy of  Pain control and level of function is assessed. Medication dosing is adjusted as deemed appropriate. Twelve weeks of medication is prescribed , patient signs for the script and is provided with a follow up appointment between 11 to 12 weeks .  

## 2017-04-25 ENCOUNTER — Other Ambulatory Visit: Payer: Self-pay | Admitting: Family Medicine

## 2017-05-25 ENCOUNTER — Other Ambulatory Visit: Payer: Self-pay | Admitting: Family Medicine

## 2017-05-25 DIAGNOSIS — M159 Polyosteoarthritis, unspecified: Secondary | ICD-10-CM

## 2017-05-25 NOTE — Telephone Encounter (Signed)
Seen [phone] 

## 2017-06-12 DIAGNOSIS — Z79899 Other long term (current) drug therapy: Secondary | ICD-10-CM | POA: Diagnosis not present

## 2017-06-12 DIAGNOSIS — I10 Essential (primary) hypertension: Secondary | ICD-10-CM | POA: Diagnosis not present

## 2017-06-12 DIAGNOSIS — R809 Proteinuria, unspecified: Secondary | ICD-10-CM | POA: Diagnosis not present

## 2017-06-12 DIAGNOSIS — D509 Iron deficiency anemia, unspecified: Secondary | ICD-10-CM | POA: Diagnosis not present

## 2017-06-12 DIAGNOSIS — N183 Chronic kidney disease, stage 3 (moderate): Secondary | ICD-10-CM | POA: Diagnosis not present

## 2017-06-12 DIAGNOSIS — E559 Vitamin D deficiency, unspecified: Secondary | ICD-10-CM | POA: Diagnosis not present

## 2017-06-13 DIAGNOSIS — N183 Chronic kidney disease, stage 3 (moderate): Secondary | ICD-10-CM | POA: Diagnosis not present

## 2017-06-13 DIAGNOSIS — N2581 Secondary hyperparathyroidism of renal origin: Secondary | ICD-10-CM | POA: Diagnosis not present

## 2017-06-13 DIAGNOSIS — I10 Essential (primary) hypertension: Secondary | ICD-10-CM | POA: Diagnosis not present

## 2017-06-13 DIAGNOSIS — R809 Proteinuria, unspecified: Secondary | ICD-10-CM | POA: Diagnosis not present

## 2017-06-15 ENCOUNTER — Other Ambulatory Visit: Payer: Self-pay | Admitting: Family Medicine

## 2017-06-22 ENCOUNTER — Other Ambulatory Visit: Payer: Self-pay | Admitting: Family Medicine

## 2017-07-02 ENCOUNTER — Ambulatory Visit: Payer: Medicare Other | Admitting: Family Medicine

## 2017-07-10 ENCOUNTER — Encounter: Payer: Self-pay | Admitting: Family Medicine

## 2017-07-16 ENCOUNTER — Ambulatory Visit: Payer: Medicare Other | Admitting: Family Medicine

## 2017-07-19 ENCOUNTER — Ambulatory Visit (INDEPENDENT_AMBULATORY_CARE_PROVIDER_SITE_OTHER): Payer: Medicare Other | Admitting: Family Medicine

## 2017-07-19 ENCOUNTER — Encounter: Payer: Self-pay | Admitting: Family Medicine

## 2017-07-19 VITALS — BP 140/82 | HR 67 | Resp 16 | Ht 62.0 in | Wt 175.0 lb

## 2017-07-19 DIAGNOSIS — R2681 Unsteadiness on feet: Secondary | ICD-10-CM | POA: Diagnosis not present

## 2017-07-19 DIAGNOSIS — E785 Hyperlipidemia, unspecified: Secondary | ICD-10-CM | POA: Diagnosis not present

## 2017-07-19 DIAGNOSIS — I10 Essential (primary) hypertension: Secondary | ICD-10-CM

## 2017-07-19 DIAGNOSIS — R7301 Impaired fasting glucose: Secondary | ICD-10-CM

## 2017-07-19 DIAGNOSIS — E038 Other specified hypothyroidism: Secondary | ICD-10-CM | POA: Diagnosis not present

## 2017-07-19 DIAGNOSIS — E1169 Type 2 diabetes mellitus with other specified complication: Secondary | ICD-10-CM | POA: Diagnosis not present

## 2017-07-19 DIAGNOSIS — G8929 Other chronic pain: Secondary | ICD-10-CM

## 2017-07-19 DIAGNOSIS — E669 Obesity, unspecified: Secondary | ICD-10-CM | POA: Diagnosis not present

## 2017-07-19 MED ORDER — METHADONE HCL 10 MG PO TABS: 10.0000 mg | ORAL_TABLET | Freq: Three times a day (TID) | ORAL | 0 refills | Status: DC

## 2017-07-19 MED ORDER — PREDNISONE 10 MG PO TABS: 10.0000 mg | ORAL_TABLET | Freq: Two times a day (BID) | ORAL | 0 refills | Status: DC

## 2017-07-19 NOTE — Patient Instructions (Addendum)
F/u with nurse the week of October 09, 2016  welness visit with Nurse the same day as the f/u with Doctor  Fasting lipid, cmp and eGFR, hBA1C, TSH in January  Be careful not to fall.  Prednisone sent in as discussed  No changes in medication   All the best for 2019! Thank you  for choosing [facility]. We consider it a privelige to serve you.  Delivering excellent health care in a caring and  compassionate way is our goal.  Partnering with you,  so that together we can achieve this goal is our strategy.

## 2017-07-20 ENCOUNTER — Other Ambulatory Visit: Payer: Self-pay | Admitting: Family Medicine

## 2017-07-24 ENCOUNTER — Encounter: Payer: Self-pay | Admitting: Family Medicine

## 2017-07-24 NOTE — Assessment & Plan Note (Signed)
hgome safety reviewed

## 2017-07-24 NOTE — Assessment & Plan Note (Signed)
Improved. Pt applauded on succesful weight loss through lifestyle change, and encouraged to continue same. Weight loss goal set for the next several months.  

## 2017-07-24 NOTE — Assessment & Plan Note (Signed)
Updated lab needed.  

## 2017-07-24 NOTE — Assessment & Plan Note (Signed)
Controlled, no change in medication DASH diet and commitment to daily physical activity for a minimum of 30 minutes discussed and encouraged, as a part of hypertension management. The importance of attaining a healthy weight is also discussed.  BP/Weight 07/19/2017 04/11/2017 01/17/2017 11/08/2016 07/10/2016 08/15/5972 02/04/8549  Systolic BP 158 682 574 935 521 747 159  Diastolic BP 82 70 62 72 74 80 62  Wt. (Lbs) 175 183.5 187 193 203 204 218  BMI 32.01 33.56 32.1 33.13 34.84 35.02 37.4

## 2017-07-24 NOTE — Assessment & Plan Note (Signed)
Katelyn Rowland is reminded of the importance of commitment to daily physical activity for 30 minutes or more, as able and the need to limit carbohydrate intake to 30 to 60 grams per meal to help with blood sugar control.   Katelyn Rowland is reminded of the importance of daily foot exam, annual eye examination, and good blood sugar, blood pressure and cholesterol control. Diet controlled Updated lab needed    Diabetic Labs Latest Ref Rng & Units 04/12/2017 04/04/2017 12/01/2016 06/12/2016 12/10/2015  HbA1c 4.8 - 5.6 % - - 6.5(H) 6.9(H) 7.3  Microalbumin Not Estab. ug/mL 487.9(H) - - - -  Micro/Creat Ratio 0.0 - 30.0 mg/g creat 555.1(H) - - - -  Chol 100 - 199 mg/dL - 121 119 153 -  HDL >39 mg/dL - 53 42 84 -  Calc LDL 0 - 99 mg/dL - 47 45 54 36  Triglycerides 0 - 149 mg/dL - 106 162(H) 75 -  Creatinine 0.57 - 1.00 mg/dL - 1.57(H) 1.52(H) 1.25(H) -   BP/Weight 07/19/2017 04/11/2017 01/17/2017 11/08/2016 07/10/2016 02/12/4783 08/08/8206  Systolic BP 138 871 959 747 185 501 586  Diastolic BP 82 70 62 72 74 80 62  Wt. (Lbs) 175 183.5 187 193 203 204 218  BMI 32.01 33.56 32.1 33.13 34.84 35.02 37.4   Foot/eye exam completion dates 04/11/2017 03/13/2016  Foot exam Order - -  Foot Form Completion Done Done

## 2017-07-24 NOTE — Assessment & Plan Note (Signed)
Hyperlipidemia:Low fat diet discussed and encouraged.   Lipid Panel  Lab Results  Component Value Date   CHOL 121 04/04/2017   HDL 53 04/04/2017   LDLCALC 47 04/04/2017   TRIG 106 04/04/2017   CHOLHDL 2.3 04/04/2017   Updated lab needed at/ before next visit.

## 2017-07-24 NOTE — Assessment & Plan Note (Signed)
The patient's Controlled Substance registry is reviewed and compliance confirmed. Adequacy of  Pain control and level of function is assessed. Medication dosing is adjusted as deemed appropriate. Twelve weeks of medication is prescribed , patient signs for the script and is provided with a follow up appointment between 11 to 12 weeks .  

## 2017-07-24 NOTE — Progress Notes (Signed)
Katelyn Rowland     MRN: [id number]      DOB: 1941-02-11   HPI Katelyn Rowland is here for follow up and re-evaluation of chronic medical conditions, medication management and review of any available recent lab and radiology data.  Preventive health is updated, specifically  Cancer screening and Immunization.   Questions or concerns regarding consultations or procedures which the PT has had in the interim are  addressed. The PT denies any adverse reactions to current medications since the last visit.  There are no new concerns.  There are no specific complaints   ROS Denies recent fever or chills. Denies sinus pressure, nasal congestion, ear pain or sore throat. Denies chest congestion, productive cough or wheezing. Denies chest pains, palpitations and leg swelling Denies abdominal pain, nausea, vomiting,diarrhea or constipation.   Denies dysuria, frequency, hesitancy or incontinence. Denies uncontrolled joint pain, swelling and limitation in mobility. Denies headaches, seizures, numbness, or tingling. Denies depression, anxiety or insomnia. Denies skin break down or rash.   PE  BP 140/82   Pulse 67   Resp 16   Ht 5\' 2"  (1.575 m)   Wt 175 lb (79.4 kg)   SpO2 95%   BMI 32.01 kg/m   Patient alert and oriented and in no cardiopulmonary distress.  HEENT: No facial asymmetry, EOMI,   oropharynx pink and moist.  Neck decreased ROM no JVD, no mass.  Chest: Clear to auscultation bilaterally.  CVS: S1, S2 no murmurs, no S3.Regular rate.  ABD: Soft non tender.   Ext: No edema  MS: Decreased  ROM spine, shoulders, hips and knees.  Skin: Intact, no ulcerations or rash noted.  Psych: Good eye contact, normal affect. Memory intact not anxious or depressed appearing.  CNS: CN 2-12 intact, power,  normal throughout.no focal deficits noted.   Assessment & Plan  Encounter for chronic pain management The patient's Controlled Substance registry is reviewed and compliance  confirmed. Adequacy of  Pain control and level of function is assessed. Medication dosing is adjusted as deemed appropriate. Twelve weeks of medication is prescribed , patient signs for the script and is provided with a follow up appointment between 11 to 12 weeks .   Essential hypertension Controlled, no change in medication DASH diet and commitment to daily physical activity for a minimum of 30 minutes discussed and encouraged, as a part of hypertension management. The importance of attaining a healthy weight is also discussed.  BP/Weight 07/19/2017 04/11/2017 01/17/2017 11/08/2016 07/10/2016 04/12/7892 03/07/174  Systolic BP 102 585 277 824 235 361 443  Diastolic BP 82 70 62 72 74 80 62  Wt. (Lbs) 175 183.5 187 193 203 204 218  BMI 32.01 33.56 32.1 33.13 34.84 35.02 37.4       Hypothyroidism Updated lab needed .   Morbid obesity Improved. Pt applauded on succesful weight loss through lifestyle change, and encouraged to continue same. Weight loss goal set for the next several months.   Hyperlipidemia LDL goal <100 Hyperlipidemia:Low fat diet discussed and encouraged.   Lipid Panel  Lab Results  Component Value Date   CHOL 121 04/04/2017   HDL 53 04/04/2017   LDLCALC 47 04/04/2017   TRIG 106 04/04/2017   CHOLHDL 2.3 04/04/2017   Updated lab needed at/ before next visit.     Diabetes mellitus type 2 in obese [facility]) Katelyn Rowland is reminded of the importance of commitment to daily physical activity for 30 minutes or more, as able and the need to limit carbohydrate  intake to 30 to 60 grams per meal to help with blood sugar control.   Katelyn Rowland is reminded of the importance of daily foot exam, annual eye examination, and good blood sugar, blood pressure and cholesterol control. Diet controlled Updated lab needed    Diabetic Labs Latest Ref Rng & Units 04/12/2017 04/04/2017 12/01/2016 06/12/2016 12/10/2015  HbA1c 4.8 - 5.6 % - - 6.5(H) 6.9(H) 7.3  Microalbumin Not Estab. ug/mL  487.9(H) - - - -  Micro/Creat Ratio 0.0 - 30.0 mg/g creat 555.1(H) - - - -  Chol 100 - 199 mg/dL - 121 119 153 -  HDL >39 mg/dL - 53 42 84 -  Calc LDL 0 - 99 mg/dL - 47 45 54 36  Triglycerides 0 - 149 mg/dL - 106 162(H) 75 -  Creatinine 0.57 - 1.00 mg/dL - 1.57(H) 1.52(H) 1.25(H) -   BP/Weight 07/19/2017 04/11/2017 01/17/2017 11/08/2016 07/10/2016 08/09/2447 02/09/3004  Systolic BP 110 211 173 567 014 103 013  Diastolic BP 82 70 62 72 74 80 62  Wt. (Lbs) 175 183.5 187 193 203 204 218  BMI 32.01 33.56 32.1 33.13 34.84 35.02 37.4   Foot/eye exam completion dates 04/11/2017 03/13/2016  Foot exam Order - -  Foot Form Completion Done Done        Gait instability hgome safety reviewed

## 2017-07-25 ENCOUNTER — Other Ambulatory Visit: Payer: Self-pay | Admitting: Family Medicine

## 2017-07-25 NOTE — Telephone Encounter (Signed)
Seen 12 13 18 

## 2017-08-17 ENCOUNTER — Telehealth: Payer: Self-pay | Admitting: Family Medicine

## 2017-08-17 NOTE — Telephone Encounter (Signed)
Patient left voicemail requesting a call from brandi Cb#: [phone] * states its important   - patient called back 20 min later requesting refill for methadone, muscle relaxer, and methocarbamol.

## 2017-08-20 ENCOUNTER — Other Ambulatory Visit: Payer: Self-pay | Admitting: Family Medicine

## 2017-08-20 ENCOUNTER — Other Ambulatory Visit: Payer: Self-pay

## 2017-08-20 MED ORDER — METHOCARBAMOL 500 MG PO TABS: ORAL_TABLET | ORAL | 5 refills | Status: DC

## 2017-08-20 NOTE — Telephone Encounter (Signed)
methacarbamol refilled but she was given enough rx's for methadone to last until her next appointment. If she dropped all 3 off at the pharmacy, they have the rx's.

## 2017-08-21 ENCOUNTER — Telehealth: Payer: Self-pay | Admitting: Family Medicine

## 2017-08-21 NOTE — Telephone Encounter (Signed)
Left voicemail for patient to followup with msg from Friday, she requested medication. Brandi sent me a msg and Im not sure if patient is aware.

## 2017-08-30 ENCOUNTER — Other Ambulatory Visit: Payer: Self-pay | Admitting: Family Medicine

## 2017-08-31 DIAGNOSIS — E669 Obesity, unspecified: Secondary | ICD-10-CM | POA: Diagnosis not present

## 2017-08-31 DIAGNOSIS — E1169 Type 2 diabetes mellitus with other specified complication: Secondary | ICD-10-CM | POA: Diagnosis not present

## 2017-08-31 DIAGNOSIS — E785 Hyperlipidemia, unspecified: Secondary | ICD-10-CM | POA: Diagnosis not present

## 2017-09-01 LAB — LIPID PANEL
Chol/HDL Ratio: 2.9 ratio (ref 0.0–4.4)
Cholesterol, Total: 134 mg/dL (ref 100–199)
HDL: 47 mg/dL (ref >39)
LDL CALC: 53 mg/dL (ref 0–99)
Triglycerides: 168 mg/dL — ABNORMAL HIGH (ref 0–149)
VLDL Cholesterol Cal: 34 mg/dL (ref 5–40)

## 2017-09-01 LAB — CMP14+EGFR
ALT: 8 IU/L (ref 0–32)
AST: 16 IU/L (ref 0–40)
Albumin/Globulin Ratio: 1.4 (ref 1.2–2.2)
Albumin: 4.2 g/dL (ref 3.5–4.8)
Alkaline Phosphatase: 79 IU/L (ref 39–117)
BUN / CREAT RATIO: 12 (ref 12–28)
BUN: 21 mg/dL (ref 8–27)
Bilirubin Total: 0.3 mg/dL (ref 0.0–1.2)
CO2: 27 mmol/L (ref 20–29)
CREATININE: 1.77 mg/dL — AB (ref 0.57–1.00)
Calcium: 9.7 mg/dL (ref 8.7–10.3)
Chloride: 102 mmol/L (ref 96–106)
GFR calc non Af Amer: 27 mL/min/{1.73_m2} — ABNORMAL LOW (ref >59)
GFR, EST AFRICAN AMERICAN: 31 mL/min/{1.73_m2} — AB (ref >59)
GLUCOSE: 103 mg/dL — AB (ref 65–99)
Globulin, Total: 2.9 g/dL (ref 1.5–4.5)
Potassium: 5.1 mmol/L (ref 3.5–5.2)
Sodium: 141 mmol/L (ref 134–144)
TOTAL PROTEIN: 7.1 g/dL (ref 6.0–8.5)

## 2017-09-01 LAB — HEMOGLOBIN A1C
ESTIMATED AVERAGE GLUCOSE: 126 mg/dL
HEMOGLOBIN A1C: 6 % — AB (ref 4.8–5.6)

## 2017-09-01 LAB — TSH: TSH: 1.19 u[IU]/mL (ref 0.450–4.500)

## 2017-09-04 ENCOUNTER — Encounter: Payer: Self-pay | Admitting: Family Medicine

## 2017-09-12 ENCOUNTER — Other Ambulatory Visit: Payer: Self-pay | Admitting: Family Medicine

## 2017-09-24 DIAGNOSIS — R809 Proteinuria, unspecified: Secondary | ICD-10-CM | POA: Diagnosis not present

## 2017-09-24 DIAGNOSIS — D509 Iron deficiency anemia, unspecified: Secondary | ICD-10-CM | POA: Diagnosis not present

## 2017-09-24 DIAGNOSIS — E559 Vitamin D deficiency, unspecified: Secondary | ICD-10-CM | POA: Diagnosis not present

## 2017-09-24 DIAGNOSIS — Z79899 Other long term (current) drug therapy: Secondary | ICD-10-CM | POA: Diagnosis not present

## 2017-09-24 DIAGNOSIS — N183 Chronic kidney disease, stage 3 (moderate): Secondary | ICD-10-CM | POA: Diagnosis not present

## 2017-09-24 DIAGNOSIS — I10 Essential (primary) hypertension: Secondary | ICD-10-CM | POA: Diagnosis not present

## 2017-09-26 DIAGNOSIS — N2581 Secondary hyperparathyroidism of renal origin: Secondary | ICD-10-CM | POA: Diagnosis not present

## 2017-09-26 DIAGNOSIS — R809 Proteinuria, unspecified: Secondary | ICD-10-CM | POA: Diagnosis not present

## 2017-09-26 DIAGNOSIS — N184 Chronic kidney disease, stage 4 (severe): Secondary | ICD-10-CM | POA: Diagnosis not present

## 2017-09-26 DIAGNOSIS — E559 Vitamin D deficiency, unspecified: Secondary | ICD-10-CM | POA: Diagnosis not present

## 2017-10-08 ENCOUNTER — Ambulatory Visit: Payer: Medicare Other

## 2017-10-09 ENCOUNTER — Ambulatory Visit: Payer: Medicare Other | Admitting: Family Medicine

## 2017-10-10 ENCOUNTER — Other Ambulatory Visit: Payer: Self-pay | Admitting: Family Medicine

## 2017-10-10 ENCOUNTER — Ambulatory Visit (INDEPENDENT_AMBULATORY_CARE_PROVIDER_SITE_OTHER): Payer: Medicare Other | Admitting: Family Medicine

## 2017-10-10 ENCOUNTER — Ambulatory Visit: Payer: Medicare Other

## 2017-10-10 DIAGNOSIS — I10 Essential (primary) hypertension: Secondary | ICD-10-CM

## 2017-10-10 MED ORDER — METHADONE HCL 10 MG PO TABS: 10.0000 mg | ORAL_TABLET | Freq: Three times a day (TID) | ORAL | 0 refills | Status: DC

## 2017-10-10 NOTE — Progress Notes (Signed)
Fill Date ID Written Drug Qty Days Prescriber Rx # Pharmacy Refill Daily Dose * Pymt Type PMP  09/18/2017  1  07/19/2017  Methadone Hcl 10 Mg Tablet  90 30 Ma Sim  [id number] Car ([id number])  0 90.00 MME Comm Ins  [place]  08/20/2017  1  07/19/2017  Methadone Hcl 10 Mg Tablet  90 30 Ma Sim  [id number] Car ([id number])  0 90.00 MME Comm Ins  [place]  07/20/2017  1  07/19/2017  Methadone Hcl 10 Mg Tablet  90 30 Ma Sim  [id number] Car ([id number])  0 90.00 MME Comm Ins  [place]  06/22/2017  1  04/11/2017  Methadone Hcl 10 Mg Tablet  90 30 Ma Sim  [id number] Car ([id number])  0 90.00 MME Comm Ins  [place]  05/23/2017  1  04/11/2017  Methadone Hcl 10 Mg Tablet  90 30 Ma Sim  [id number] Car ([id number])  0 90.00 MME Comm Ins  [place]  04/23/2017  1  04/11/2017  Methadone Hcl 10 Mg Tablet

## 2017-10-17 ENCOUNTER — Encounter: Payer: Self-pay | Admitting: Family Medicine

## 2017-10-17 ENCOUNTER — Ambulatory Visit: Payer: Medicare Other | Admitting: Family Medicine

## 2017-10-17 ENCOUNTER — Ambulatory Visit (INDEPENDENT_AMBULATORY_CARE_PROVIDER_SITE_OTHER): Payer: Medicare Other | Admitting: Family Medicine

## 2017-10-17 VITALS — BP 130/80 | HR 78 | Resp 16 | Ht 62.0 in | Wt 185.0 lb

## 2017-10-17 DIAGNOSIS — E785 Hyperlipidemia, unspecified: Secondary | ICD-10-CM

## 2017-10-17 DIAGNOSIS — R52 Pain, unspecified: Secondary | ICD-10-CM

## 2017-10-17 DIAGNOSIS — I10 Essential (primary) hypertension: Secondary | ICD-10-CM

## 2017-10-17 DIAGNOSIS — E669 Obesity, unspecified: Secondary | ICD-10-CM | POA: Diagnosis not present

## 2017-10-17 DIAGNOSIS — E038 Other specified hypothyroidism: Secondary | ICD-10-CM | POA: Diagnosis not present

## 2017-10-17 DIAGNOSIS — G8929 Other chronic pain: Secondary | ICD-10-CM | POA: Diagnosis not present

## 2017-10-17 DIAGNOSIS — E1169 Type 2 diabetes mellitus with other specified complication: Secondary | ICD-10-CM

## 2017-10-17 MED ORDER — METHADONE HCL 10 MG PO TABS: 10.0000 mg | ORAL_TABLET | Freq: Three times a day (TID) | ORAL | 0 refills | Status: DC

## 2017-10-17 MED ORDER — PREDNISONE 10 MG PO TABS: ORAL_TABLET | ORAL | 0 refills | Status: DC

## 2017-10-17 NOTE — Patient Instructions (Addendum)
Wellness with nurse in 11 weeks , same day as pain visit  TSH, fast lipid, chem 7 and EGFR, 2 weeks before next visit   No change in medication  Take care not to fall    

## 2017-10-17 NOTE — Progress Notes (Signed)
Fill Date ID Written Drug Qty Days Prescriber Rx # Pharmacy Refill Daily Dose * Pymt Type PMP  09/18/2017  1  07/19/2017  Methadone Hcl 10 Mg Tablet  90 30 Ma Sim  [id number] Car ([id number])  0 90.00 MME Comm Ins  [place]  08/20/2017  1  07/19/2017  Methadone Hcl 10 Mg Tablet  90 30 Ma Sim  [id number] Car ([id number])  0 90.00 MME Comm Ins  [place]  07/20/2017  1  07/19/2017  Methadone Hcl 10 Mg Tablet  90 30 Ma Sim  [id number] Car ([id number])  0 90.00 MME Comm Ins  [place]  06/22/2017  1  04/11/2017  Methadone Hcl 10 Mg Tablet  90 30 Ma Sim  [id number] Car ([id number])  0 90.00 MME Comm Ins  [place]  05/23/2017  1  04/11/2017  Methadone Hcl 10 Mg Tablet  90 30 Ma Sim  [id number] Car ([id number])  0 90.00 MME Comm Ins  [place]  04/23/2017  1  04/11/2017  Methadone Hcl 10 Mg Tablet  90 30 Ma Sim  [id number] Car ([id number])  0 90.00 MME Comm Ins  [place]  03/23/2017  1  01/17/2017  Methadone Hcl 10 Mg Tablet  90 30 Ma Sim  [id number] Car ([id number])  0 90.00 MME Private Pay  [place]  02/21/2017  1  01/17/2017  Methadone Hcl 10 Mg Tablet  90 30 Ma Sim  [id number] Car ([id number])  0 90.00 MME Private Pay  [place]  01/23/2017  1  01/17/2017  Methadone Hcl 10 Mg Tablet             Reviewed in office

## 2017-10-21 ENCOUNTER — Encounter: Payer: Self-pay | Admitting: Family Medicine

## 2017-10-21 NOTE — Assessment & Plan Note (Signed)
Controlled, no change in medication  

## 2017-10-21 NOTE — Assessment & Plan Note (Signed)
Katelyn Rowland is reminded of the importance of commitment to daily physical activity for 30 minutes or more, as able and the need to limit carbohydrate intake to 30 to 60 grams per meal to help with blood sugar control.      Katelyn Rowland is reminded of the importance of daily foot exam, annual eye examination, and good blood sugar, blood pressure and cholesterol control.  Diabetic Labs Latest Ref Rng & Units 08/31/2017 04/12/2017 04/04/2017 12/01/2016 06/12/2016  HbA1c 4.8 - 5.6 % 6.0(H) - - 6.5(H) 6.9(H)  Microalbumin Not Estab. ug/mL - 487.9(H) - - -  Micro/Creat Ratio 0.0 - 30.0 mg/g creat - 555.1(H) - - -  Chol 100 - 199 mg/dL 134 - 121 119 153  HDL >39 mg/dL 47 - 53 42 84  Calc LDL 0 - 99 mg/dL 53 - 47 45 54  Triglycerides 0 - 149 mg/dL 168(H) - 106 162(H) 75  Creatinine 0.57 - 1.00 mg/dL 1.77(H) - 1.57(H) 1.52(H) 1.25(H)   BP/Weight 10/17/2017 07/19/2017 04/11/2017 01/17/2017 11/08/2016 70/10/4033 09/11/8183  Systolic BP 909 311 216 244 695 072 257  Diastolic BP 80 82 70 62 72 74 80  Wt. (Lbs) 185 175 183.5 187 193 203 204  BMI 33.84 32.01 33.56 32.1 33.13 34.84 35.02   Foot/eye exam completion dates 04/11/2017 03/13/2016  Foot exam Order - -  Foot Form Completion Done Done

## 2017-10-21 NOTE — Assessment & Plan Note (Signed)
Hyperlipidemia:Low fat diet discussed and encouraged.   Lipid Panel  Lab Results  Component Value Date   CHOL 134 08/31/2017   HDL 47 08/31/2017   LDLCALC 53 08/31/2017   TRIG 168 (H) 08/31/2017   CHOLHDL 2.9 08/31/2017   Needs to reduce fat in diet

## 2017-10-21 NOTE — Assessment & Plan Note (Signed)
2 week burst of prednisone prescribed

## 2017-10-21 NOTE — Progress Notes (Signed)
   ROCKELLE HEUERMAN     MRN: [id number]      DOB: September 16, 1940   HPI Katelyn Rowland is here for follow up and re-evaluation of chronic medical conditions, in particular chronic pain management,  and review of any available recent lab and radiology data.  States her joint pain an stiffness is not getting any better but she is making the best of it The PT denies any adverse reactions to current medications since the last visit.  There are no new concerns.     ROS Denies recent fever or chills. Denies sinus pressure, nasal congestion, ear pain or sore throat. Denies chest congestion, productive cough or wheezing. Denies chest pains, palpitations and leg swelling Denies abdominal pain, nausea, vomiting,diarrhea or constipation.   Denies dysuria, frequency, hesitancy or incontinence.  Denies headaches, seizures, numbness, or tingling. Denies depression, anxiety or insomnia. Denies skin break down or rash.   PE  BP 130/80   Pulse 78   Resp 16   Ht 5\' 2"  (1.575 m)   Wt 185 lb (83.9 kg)   SpO2 95%   BMI 33.84 kg/m   Patient alert and oriented and in no cardiopulmonary distress.  HEENT: No facial asymmetry, EOMI,   oropharynx pink and moist.  Neck supple no JVD, no mass.  Chest: Clear to auscultation bilaterally.  CVS: S1, S2 no murmurs, no S3.Regular rate.  ABD: Soft non tender.   Ext: No edema  MS: Decreased  ROM spine, shoulders, hips and knees.  Skin: Intact, no ulcerations or rash noted.  Psych: Good eye contact, normal affect. Memory intact not anxious or depressed appearing.  CNS: CN 2-12 intact, power,  normal throughout.no focal deficits noted.   Assessment & Plan  Encounter for chronic pain management The patient's Controlled Substance registry is reviewed and compliance confirmed. Adequacy of  Pain control and level of function is assessed. Medication dosing is adjusted as deemed appropriate. Twelve weeks of medication is prescribed , patient signs for the script  and is provided with a follow up appointment between 11 to 12 weeks .   Essential hypertension Controlled, no change in medication   Generalized pain 2 week burst of prednisone prescribed  Hypothyroidism Controlled, no change in medication   Hyperlipidemia LDL goal <100 Hyperlipidemia:Low fat diet discussed and encouraged.   Lipid Panel  Lab Results  Component Value Date   CHOL 134 08/31/2017   HDL 47 08/31/2017   LDLCALC 53 08/31/2017   TRIG 168 (H) 08/31/2017   CHOLHDL 2.9 08/31/2017   Needs to reduce fat in diet

## 2017-10-21 NOTE — Assessment & Plan Note (Signed)
The patient's Controlled Substance registry is reviewed and compliance confirmed. Adequacy of  Pain control and level of function is assessed. Medication dosing is adjusted as deemed appropriate. Twelve weeks of medication is prescribed , patient signs for the script and is provided with a follow up appointment between 11 to 12 weeks .  

## 2017-10-22 ENCOUNTER — Encounter: Payer: Self-pay | Admitting: Family Medicine

## 2017-10-22 DIAGNOSIS — M5116 Intervertebral disc disorders with radiculopathy, lumbar region: Secondary | ICD-10-CM | POA: Insufficient documentation

## 2017-10-22 DIAGNOSIS — M4726 Other spondylosis with radiculopathy, lumbar region: Secondary | ICD-10-CM | POA: Insufficient documentation

## 2017-11-10 ENCOUNTER — Other Ambulatory Visit: Payer: Self-pay | Admitting: Family Medicine

## 2017-12-04 ENCOUNTER — Other Ambulatory Visit: Payer: Self-pay | Admitting: Family Medicine

## 2017-12-12 ENCOUNTER — Other Ambulatory Visit: Payer: Self-pay | Admitting: Family Medicine

## 2017-12-12 ENCOUNTER — Telehealth: Payer: Self-pay | Admitting: Family Medicine

## 2017-12-12 MED ORDER — CYCLOBENZAPRINE HCL 5 MG PO TABS: 5.0000 mg | ORAL_TABLET | Freq: Every day | ORAL | 1 refills | Status: DC

## 2017-12-12 NOTE — Telephone Encounter (Signed)
Pt is calling to confirm appts, and would like to be put back on Generic Flexarell

## 2017-12-12 NOTE — Telephone Encounter (Signed)
Wants to go back on generic flexeril. Said if you agree please send to the pharmacy

## 2017-12-12 NOTE — Telephone Encounter (Signed)
Medication is sent in, cyclobenzaprine 5mg  one at bedtime, no higher dose will be prescribed

## 2018-01-01 DIAGNOSIS — R809 Proteinuria, unspecified: Secondary | ICD-10-CM | POA: Diagnosis not present

## 2018-01-01 DIAGNOSIS — E7800 Pure hypercholesterolemia, unspecified: Secondary | ICD-10-CM | POA: Diagnosis not present

## 2018-01-01 DIAGNOSIS — I10 Essential (primary) hypertension: Secondary | ICD-10-CM | POA: Diagnosis not present

## 2018-01-01 DIAGNOSIS — E1169 Type 2 diabetes mellitus with other specified complication: Secondary | ICD-10-CM | POA: Diagnosis not present

## 2018-01-01 DIAGNOSIS — N183 Chronic kidney disease, stage 3 (moderate): Secondary | ICD-10-CM | POA: Diagnosis not present

## 2018-01-01 DIAGNOSIS — Z79899 Other long term (current) drug therapy: Secondary | ICD-10-CM | POA: Diagnosis not present

## 2018-01-01 DIAGNOSIS — E669 Obesity, unspecified: Secondary | ICD-10-CM | POA: Diagnosis not present

## 2018-01-01 DIAGNOSIS — D509 Iron deficiency anemia, unspecified: Secondary | ICD-10-CM | POA: Diagnosis not present

## 2018-01-01 DIAGNOSIS — Z1159 Encounter for screening for other viral diseases: Secondary | ICD-10-CM | POA: Diagnosis not present

## 2018-01-01 DIAGNOSIS — E559 Vitamin D deficiency, unspecified: Secondary | ICD-10-CM | POA: Diagnosis not present

## 2018-01-02 LAB — CMP14+EGFR
A/G RATIO: 1.7 (ref 1.2–2.2)
ALT: 13 IU/L (ref 0–32)
AST: 18 IU/L (ref 0–40)
Albumin: 4.3 g/dL (ref 3.5–4.8)
Alkaline Phosphatase: 72 IU/L (ref 39–117)
BILIRUBIN TOTAL: 0.3 mg/dL (ref 0.0–1.2)
BUN/Creatinine Ratio: 13 (ref 12–28)
BUN: 22 mg/dL (ref 8–27)
CALCIUM: 9.4 mg/dL (ref 8.7–10.3)
CO2: 22 mmol/L (ref 20–29)
Chloride: 101 mmol/L (ref 96–106)
Creatinine, Ser: 1.72 mg/dL — ABNORMAL HIGH (ref 0.57–1.00)
GFR, EST AFRICAN AMERICAN: 33 mL/min/{1.73_m2} — AB (ref >59)
GFR, EST NON AFRICAN AMERICAN: 28 mL/min/{1.73_m2} — AB (ref >59)
GLOBULIN, TOTAL: 2.6 g/dL (ref 1.5–4.5)
Glucose: 110 mg/dL — ABNORMAL HIGH (ref 65–99)
POTASSIUM: 5.2 mmol/L (ref 3.5–5.2)
SODIUM: 138 mmol/L (ref 134–144)
TOTAL PROTEIN: 6.9 g/dL (ref 6.0–8.5)

## 2018-01-02 LAB — LIPID PANEL
CHOLESTEROL TOTAL: 117 mg/dL (ref 100–199)
Chol/HDL Ratio: 2.7 ratio (ref 0.0–4.4)
HDL: 44 mg/dL (ref >39)
LDL Calculated: 51 mg/dL (ref 0–99)
TRIGLYCERIDES: 108 mg/dL (ref 0–149)
VLDL Cholesterol Cal: 22 mg/dL (ref 5–40)

## 2018-01-02 LAB — TSH: TSH: 1.1 u[IU]/mL (ref 0.450–4.500)

## 2018-01-07 ENCOUNTER — Encounter: Payer: Self-pay | Admitting: Family Medicine

## 2018-01-07 ENCOUNTER — Ambulatory Visit (INDEPENDENT_AMBULATORY_CARE_PROVIDER_SITE_OTHER): Payer: Medicare Other

## 2018-01-07 ENCOUNTER — Ambulatory Visit (INDEPENDENT_AMBULATORY_CARE_PROVIDER_SITE_OTHER): Payer: Medicare Other | Admitting: Family Medicine

## 2018-01-07 VITALS — BP 126/70 | HR 85 | Temp 98.0°F | Resp 16 | Ht 62.0 in | Wt 183.1 lb

## 2018-01-07 VITALS — BP 126/70 | HR 85 | Resp 16 | Ht 62.0 in | Wt 183.0 lb

## 2018-01-07 DIAGNOSIS — E039 Hypothyroidism, unspecified: Secondary | ICD-10-CM | POA: Diagnosis not present

## 2018-01-07 DIAGNOSIS — I10 Essential (primary) hypertension: Secondary | ICD-10-CM | POA: Diagnosis not present

## 2018-01-07 DIAGNOSIS — E785 Hyperlipidemia, unspecified: Secondary | ICD-10-CM

## 2018-01-07 DIAGNOSIS — G8929 Other chronic pain: Secondary | ICD-10-CM

## 2018-01-07 DIAGNOSIS — E041 Nontoxic single thyroid nodule: Secondary | ICD-10-CM

## 2018-01-07 DIAGNOSIS — Z0000 Encounter for general adult medical examination without abnormal findings: Secondary | ICD-10-CM

## 2018-01-07 DIAGNOSIS — E669 Obesity, unspecified: Secondary | ICD-10-CM | POA: Diagnosis not present

## 2018-01-07 DIAGNOSIS — M25512 Pain in left shoulder: Secondary | ICD-10-CM | POA: Diagnosis not present

## 2018-01-07 MED ORDER — METHADONE HCL 10 MG PO TABS: ORAL_TABLET | ORAL | 0 refills | Status: AC

## 2018-01-07 MED ORDER — METHADONE HCL 10 MG PO TABS: ORAL_TABLET | ORAL | 0 refills | Status: DC

## 2018-01-07 MED ORDER — CYCLOBENZAPRINE HCL 10 MG PO TABS: 10.0000 mg | ORAL_TABLET | Freq: Every day | ORAL | 5 refills | Status: DC

## 2018-01-07 NOTE — Progress Notes (Signed)
 Subjective:   Katelyn Rowland is a 77 y.o. female who presents for Medicare Annual (Subsequent) preventive examination.  Review of Systems:   Cardiac Risk Factors include: advanced age (>87men, >44 women);diabetes mellitus;dyslipidemia;hypertension;obesity (BMI >30kg/m2)     Objective:     Vitals: BP 126/70 (BP Location: Left Arm, Patient Position: Sitting, Cuff Size: Large)   Pulse 85   Temp 98 F (36.7 C) (Temporal)   Resp 16   Ht 5\' 2"  (1.575 m)   Wt 183 lb 1.3 oz (83 kg)   SpO2 95%   BMI 33.49 kg/m   Body mass index is 33.49 kg/m.  Advanced Directives 01/07/2018 03/29/2016 02/04/2015 11/18/2014 10/16/2014 06/13/2012 06/04/2012  Does Patient Have a Medical Advance Directive? Yes No No Yes No Patient has advance directive, copy not in chart Patient has advance directive, copy not in chart  Type of Advance Directive Out of facility DNR (pink MOST or yellow form);Living will;Healthcare Power of [place] - - -  Does patient want to make changes to medical advance directive? - - No - Patient declined - - - -  Copy of [place] in Chart? Yes - No - copy requested No - copy requested - - -  Would patient like information on creating a medical advance directive? - No - patient declined information - - No - patient declined information - -  Pre-existing out of facility DNR order (yellow form or pink MOST form) Yellow form placed in chart (order not valid for inpatient use) - - - - - -    Tobacco Social History   Tobacco Use  Smoking Status Former Smoker  . Packs/day: 1.00  . Years: 42.00  . Pack years: 42.00  . Types: Cigarettes  . Last attempt to quit: 06/04/2010  . Years since quitting: 7.6  Smokeless Tobacco Never Used     Counseling given: Yes   Clinical Intake:  Pre-visit preparation completed: Yes  Pain : 0-10 Pain Score: 7  Pain Location: Arm(legs) Pain Orientation: Left Pain  Descriptors / Indicators: Aching, Sharp Pain Onset: More than a month ago Pain Frequency: Constant Pain Relieving Factors: pain medication (methadone) Effect of Pain on Daily Activities: yes  Pain Relieving Factors: pain medication (methadone)  BMI - recorded: 33.5 Nutritional Status: BMI > 30  Obese Diabetes: Yes CBG done?: No Did pt. bring in CBG monitor from home?: No  How often do you need to have someone help you when you read instructions, pamphlets, or other written materials from your doctor or pharmacy?: 1 - Never  Interpreter Needed?: No  Information entered by ::   Past Medical History:  Diagnosis Date  . Anemia   . Anxiety   . Arthritis   . Chronic kidney disease   . Depression   . Diabetes mellitus 2009   no meds  . 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 Edema of both legs   . GERD (gastroesophageal reflux disease)   . Headache(784.0)   . Hematoma of leg    left   . High blood pressure   . Hypertension    margaret simpson  [id number]  . Muscle cramps   . Neck pain   . PONV (postoperative nausea and vomiting)   . Reflux   . Thyroid disorder    Past Surgical History:  Procedure Laterality Date  . ABDOMINAL HYSTERECTOMY    .  APPENDECTOMY    . BIOPSY N/A 11/20/2014   Procedure: BIOPSY;  Surgeon: Rogene Houston, MD;  Location: [facility];  Service: Endoscopy;  Laterality: N/A;  . CERVICAL SPINE SURGERY    . ESOPHAGEAL DILATION N/A 11/20/2014   Procedure: ESOPHAGEAL DILATION [place];  Surgeon: Rogene Houston, MD;  Location: [facility];  Service: Endoscopy;  Laterality: N/A;  . ESOPHAGOGASTRODUODENOSCOPY (EGD) WITH PROPOFOL N/A 11/20/2014   Procedure: ESOPHAGOGASTRODUODENOSCOPY (EGD) WITH PROPOFOL;  Surgeon: Rogene Houston, MD;  Location: [facility];  Service: Endoscopy;  Laterality: N/A;  . FOOT NEUROMA SURGERY     right   . ivc filter placement, following recurrent left DVT  07/2009  . NASAL SINUS  SURGERY    . THORACIC DISCECTOMY  06/12/2012   Procedure: THORACIC DISCECTOMY;  Surgeon: Floyce Stakes, MD;  Location: [facility];  Service: Neurosurgery;  Laterality: N/A;  Thoracic Ten[place]Eleven Thoracic Laminectomy  . TONSILLECTOMY    . TONSILLECTOMY AND ADENOIDECTOMY    . unilateal oopherectomy    . VESICOVAGINAL FISTULA CLOSURE W/ TAH     Family History  Problem Relation Age of Onset  . Emphysema Sister   . Kidney disease Sister   . Diabetes Mother   . Diabetes Father   . Diabetes Brother   . Arthritis Unknown   . Kidney disease Unknown    Social History   Socioeconomic History  . Marital status: Widowed    Spouse name: Not on file  . Number of children: 4  . Years of education: 29  . Highest education level: Not on file  Occupational History  . Occupation: disabled     Employer: RETIRED  Social Needs  . Financial resource strain: Not hard at all  . Food insecurity:    Worry: Never true    Inability: Never true  . Transportation needs:    Medical: No    Non-medical: No  Tobacco Use  . Smoking status: Former Smoker    Packs/day: 1.00    Years: 42.00    Pack years: 42.00    Types: Cigarettes    Last attempt to quit: 06/04/2010    Years since quitting: 7.6  . Smokeless tobacco: Never Used  Substance and Sexual Activity  . Alcohol use: No  . Drug use: No  . Sexual activity: Never    Birth control/protection: Surgical  Lifestyle  . Physical activity:    Days per week: 7 days    Minutes per session: 60 min  . Stress: Not at all  Relationships  . Social connections:    Talks on phone: Twice a week    Gets together: Twice a week    Attends religious service: More than 4 times per year    Active member of club or organization: No    Attends meetings of clubs or organizations: Never    Relationship status: Widowed  Other Topics Concern  . Not on file  Social History Narrative  . Not on file    Outpatient Encounter Medications as of 01/07/2018    Medication Sig  . amLODipine (NORVASC) 2.5 MG tablet TAKE 1 TABLET BY MOUTH ONCE DAILY.  . cyclobenzaprine (FLEXERIL) 5 MG tablet Take 1 tablet (5 mg total) by mouth at bedtime.  . diphenhydrAMINE (BENADRYL) 25 mg capsule Take 25 mg by mouth every 8 (eight) hours as needed for allergies.  Marland Kitchen esomeprazole (NEXIUM) 40 MG capsule TAKE 1 CAPSULE (40 MG TOTAL) BY MOUTH DAILY BEFORE BREAKFAST.  Marland Kitchen gabapentin (NEURONTIN)  300 MG capsule Take 1 capsule (300 mg total) by mouth 3 (three) times daily.  Marland Kitchen levothyroxine (SYNTHROID, LEVOTHROID) 125 MCG tablet TAKE 1 TABLET BY MOUTH ONCE DAILY.  Marland Kitchen lisinopril (PRINIVIL,ZESTRIL) 2.5 MG tablet Take 2.5 mg by mouth daily.  . methadone (DOLOPHINE) 10 MG tablet Take 1 tablet (10 mg total) by mouth every 8 (eight) hours.  Marland Kitchen nystatin (MYCOSTATIN/NYSTOP) powder APPLY TO AFFECTED AREAS TWICE DAILY.  Marland Kitchen polyethylene glycol powder (GLYCOLAX/MIRALAX) powder MIX 1 CAPFUL (17 GRAMS) WITH 8OZ OF WATER OR JUICE DAILY.  Marland Kitchen predniSONE (DELTASONE) 10 MG tablet One tablet twice daily for 2 weeks  . promethazine (PHENERGAN) 25 MG tablet TAKE ONE TABLET BY MOUTH EVERY 6 HOURS AS NEEDED.  Marland Kitchen rosuvastatin (CRESTOR) 20 MG tablet TAKE 1 TABLET BY MOUTH EVERY DAY  . traZODone (DESYREL) 100 MG tablet TAKE 1 TABLET BY MOUTH AT BEDTIME.  Marland Kitchen Vitamin D, Ergocalciferol, (DRISDOL) 50000 units CAPS capsule TAKE 1 CAPSULE BY MOUTH ONCE A WEEK.   No facility-administered encounter medications on file as of 01/07/2018.     Activities of Daily Living In your present state of health, do you have any difficulty performing the following activities: 01/07/2018  Hearing? N  Vision? N  Difficulty concentrating or making decisions? N  Walking or climbing stairs? Y  Comment can do it, but it hurts  Dressing or bathing? N  Doing errands, shopping? N  Preparing Food and eating ? N  Using the Toilet? N  In the past six months, have you accidently leaked urine? Y  Do you have problems with loss of bowel  control? N  Managing your Medications? N  Managing your Finances? N  Housekeeping or managing your Housekeeping? Y  Some recent data might be hidden    Patient Care Team: Fayrene Helper, MD as PCP - General    Assessment:   This is a routine wellness examination for [place].  Exercise Activities and Dietary recommendations Current Exercise Habits: The patient does not participate in regular exercise at present, Exercise limited by: orthopedic condition(s)  Goals    None      Fall Risk Fall Risk  01/07/2018 10/17/2017 11/08/2016 03/13/2016 07/06/2015  Falls in the past year? No No No No Yes  Number falls in past yr: - - - - 2 or more  Injury with Fall? - - - - Yes  Risk Factor Category  - - - - High Fall Risk  Risk for fall due to : - - - Impaired balance/gait History of fall(s)  Follow up - - - - Falls prevention discussed   Is the patient's home free of loose throw rugs in walkways, pet beds, electrical cords, etc?   yes      Grab bars in the bathroom? no      Handrails on the stairs?   no single level home      Adequate lighting?   yes  Timed Get Up and Go performed: No  Depression Screen PHQ 2/9 Scores 01/07/2018 11/08/2016 03/13/2016 11/23/2014  PHQ - 2 Score 0 0 0 0     Cognitive Function MMSE - Mini Mental State Exam 12/10/2015  Orientation to time 5  Orientation to Place 5  Registration 3  Attention/ Calculation 5  Recall 3  Language- name 2 objects 2  Language- repeat 1  Language- follow 3 step command 3  Language- read & follow direction 1  Write a sentence 1  Copy design 0  Total score 29  6CIT Screen 01/07/2018  What Year? 0 points  What month? 0 points  What time? 0 points  Count back from 20 0 points  Months in reverse 0 points  Repeat phrase 0 points  Total Score 0    Immunization History  Administered Date(s) Administered  . Pneumococcal Polysaccharide-23 03/26/2006  . Td 12/30/2003    Qualifies for Shingles Vaccine?Patient wishes to not  have this vaccine  Screening Tests Health Maintenance  Topic Date Due  . INFLUENZA VACCINE  03/21/2018 (Originally 03/07/2018)  . OPHTHALMOLOGY EXAM  05/23/2018 (Originally 01/21/1951)  . TETANUS/TDAP  05/23/2018 (Originally 12/29/2013)  . PNA vac Low Risk Adult (2 of 2 - PCV13) 04/17/2019 (Originally 03/27/2007)  . HEMOGLOBIN A1C  02/28/2018  . FOOT EXAM  04/11/2018  . URINE MICROALBUMIN  04/12/2018  . DEXA SCAN  Completed    Cancer Screenings: Lung: Low Dose CT Chest recommended if Age 38-80 years, 30 pack-year currently smoking OR have quit w/in 15years. Patient does qualify. Breast:  Up to date on Mammogram? No  Patient states she does not get them Up to date of Bone Density/Dexa? Yes Colorectal: Patient no longer has these done       Plan:      I have personally reviewed and noted the following in the patient's chart:   . Medical and social history . Use of alcohol, tobacco or illicit drugs  . Current medications and supplements . Functional ability and status . Nutritional status . Physical activity . Advanced directives . List of other physicians . Hospitalizations, surgeries, and ER visits in previous 12 months . Vitals . Screenings to include cognitive, depression, and falls . Referrals and appointments  In addition, I have reviewed and discussed with patient certain preventive protocols, quality metrics, and best practice recommendations. A written personalized care plan for preventive services as well as general preventive health recommendations were provided to patient.     Tod Persia, [place]  01/07/2018

## 2018-01-07 NOTE — Progress Notes (Signed)
Fill Date ID Written Drug Qty Days Prescriber Rx # Pharmacy Refill Daily Dose * Pymt Type PMP  12/15/2017  1  10/17/2017  Methadone Hcl 10 Mg Tablet  90 30 Ma Sim  [id number]  Car ([id number])  0 90.00 MME Comm Ins  [place]  11/16/2017  1  10/17/2017  Methadone Hcl 10 Mg Tablet  90 30 Ma Sim  [id number]  Car ([id number])  0 90.00 MME Comm Ins  [place]  09/18/2017  1  07/19/2017  Methadone Hcl 10 Mg Tablet  90 30 Ma Sim  [id number]  Car ([id number])  0 90.00 MME Comm Ins  [place]  08/20/2017  1  07/19/2017  Methadone Hcl 10 Mg Tablet  90 30 Ma Sim  [id number]  Car ([id number])  0 90.00 MME Comm Ins  [place]  07/20/2017  1  07/19/2017  Methadone Hcl 10 Mg Tablet  90 30 Ma Sim  [id number]  Car ([id number])  0 90.00 MME Comm Ins  [place]  06/22/2017  1  04/11/2017  Methadone Hcl 10 Mg Tablet  90 30 Ma Sim  [id number]  Car ([id number])  0 90.00 MME Comm Ins  [place]  05/23/2017  1  04/11/2017  Methadone Hcl 10 Mg Tablet  90 30 Ma Sim  [id number]  Car ([id number])  0 90.00 MME Comm Ins  [place]  04/23/2017  1  04/11/2017  Methadone Hcl 10 Mg Tablet  90 30 Ma Sim  [id number]  Car ([id number])  0 90.00 MME Comm Ins  [place]  03/23/2017  1  01/17/2017  Methadone Hcl 10 Mg Tablet  90 30 Ma Sim  [id number]  Car ([id number])  0 90.00 MME Private Pay  [place]  02/21/2017  1  01/17/2017  Methadone Hcl 10 Mg Tablet  90 30 Ma Sim  [id number]  Car ([id number])  0 90.00 MME Private Pay  [place]  01/23/2017  1  01/17/2017  Methadone Hcl 10 Mg Tablet  90 30 Ma Sim  [id number]  Car ([id number])  0 90.00 MME Comm Ins  [place]  12/25/2016  1  11/08/2016  Methadone Hcl 10 Mg Tablet

## 2018-01-07 NOTE — Patient Instructions (Addendum)
[name] , Thank you for taking time to come for your Medicare Wellness Visit. I appreciate your ongoing commitment to your health goals. Please review the following plan we discussed and let me know if I can assist you in the future.   Screening recommendations/referrals: Colonoscopy: You no longer get these done Mammogram: You no longer get these done Bone Density: This is up to date Recommended yearly ophthalmology/optometry visit for glaucoma screening and checkup Recommended yearly dental visit for hygiene and checkup  Vaccinations: Influenza vaccine: You don't wish to get the flu vaccine Pneumococcal vaccine: You don't wish to get the pneumonia vaccine Tdap vaccine: You don't wish to get the Tdap vaccine vaccine Shingles vaccine: You don't wish to get the Shingles vaccine   Advanced directives: You already have these in place  Conditions/risks identified: We have discussed during the visit  Next appointment: Today after our visit   Preventive Care 24 Years and Older, Female Preventive care refers to lifestyle choices and visits with your health care provider that can promote health and wellness. What does preventive care include?  A yearly physical exam. This is also called an annual well check.  Dental exams once or twice a year.  Routine eye exams. Ask your health care provider how often you should have your eyes checked.  Personal lifestyle choices, including:  Daily care of your teeth and gums.  Regular physical activity.  Eating a healthy diet.  Avoiding tobacco and drug use.  Limiting alcohol use.  Practicing safe sex.  Taking low-dose aspirin every day.  Taking vitamin and mineral supplements as recommended by your health care provider. What happens during an annual well check? The services and screenings done by your health care provider during your annual well check will depend on your age, overall health, lifestyle risk factors, and family history  of disease. Counseling  Your health care provider may ask you questions about your:  Alcohol use.  Tobacco use.  Drug use.  Emotional well-being.  Home and relationship well-being.  Sexual activity.  Eating habits.  History of falls.  Memory and ability to understand (cognition).  Work and work Statistician.  Reproductive health. Screening  You may have the following tests or measurements:  Height, weight, and BMI.  Blood pressure.  Lipid and cholesterol levels. These may be checked every 5 years, or more frequently if you are over 2 years old.  Skin check.  Lung cancer screening. You may have this screening every year starting at age 22 if you have a 30-pack-year history of smoking and currently smoke or have quit within the past 15 years.  Fecal occult blood test (FOBT) of the stool. You may have this test every year starting at age 23.  Flexible sigmoidoscopy or colonoscopy. You may have a sigmoidoscopy every 5 years or a colonoscopy every 10 years starting at age 96.  Hepatitis C blood test.  Hepatitis B blood test.  Sexually transmitted disease (STD) testing.  Diabetes screening. This is done by checking your blood sugar (glucose) after you have not eaten for a while (fasting). You may have this done every 1-3 years.  Bone density scan. This is done to screen for osteoporosis. You may have this done starting at age 56.  Mammogram. This may be done every 1-2 years. Talk to your health care provider about how often you should have regular mammograms. Talk with your health care provider about your test results, treatment options, and if necessary, the need for more tests.  Vaccines  Your health care provider may recommend certain vaccines, such as:  Influenza vaccine. This is recommended every year.  Tetanus, diphtheria, and acellular pertussis (Tdap, Td) vaccine. You may need a Td booster every 10 years.  Zoster vaccine. You may need this after age  35.  Pneumococcal 13-valent conjugate (PCV13) vaccine. One dose is recommended after age 45.  Pneumococcal polysaccharide (PPSV23) vaccine. One dose is recommended after age 34. Talk to your health care provider about which screenings and vaccines you need and how often you need them. This information is not intended to replace advice given to you by your health care provider. Make sure you discuss any questions you have with your health care provider. Document Released: 08/20/2015 Document Revised: 04/12/2016 Document Reviewed: 05/25/2015 Elsevier Interactive Patient Education  2017 [place] Prevention in the Home Falls can cause injuries. They can happen to people of all ages. There are many things you can do to make your home safe and to help prevent falls. What can I do on the outside of my home?  Regularly fix the edges of walkways and driveways and fix any cracks.  Remove anything that might make you trip as you walk through a door, such as a raised step or threshold.  Trim any bushes or trees on the path to your home.  Use bright outdoor lighting.  Clear any walking paths of anything that might make someone trip, such as rocks or tools.  Regularly check to see if handrails are loose or broken. Make sure that both sides of any steps have handrails.  Any raised decks and porches should have guardrails on the edges.  Have any leaves, snow, or ice cleared regularly.  Use sand or salt on walking paths during winter.  Clean up any spills in your garage right away. This includes oil or grease spills. What can I do in the bathroom?  Use night lights.  Install grab bars by the toilet and in the tub and shower. Do not use towel bars as grab bars.  Use non-skid mats or decals in the tub or shower.  If you need to sit down in the shower, use a plastic, non-slip stool.  Keep the floor dry. Clean up any water that spills on the floor as soon as it happens.  Remove  soap buildup in the tub or shower regularly.  Attach bath mats securely with double-sided non-slip rug tape.  Do not have throw rugs and other things on the floor that can make you trip. What can I do in the bedroom?  Use night lights.  Make sure that you have a light by your bed that is easy to reach.  Do not use any sheets or blankets that are too big for your bed. They should not hang down onto the floor.  Have a firm chair that has side arms. You can use this for support while you get dressed.  Do not have throw rugs and other things on the floor that can make you trip. What can I do in the kitchen?  Clean up any spills right away.  Avoid walking on wet floors.  Keep items that you use a lot in easy-to-reach places.  If you need to reach something above you, use a strong step stool that has a grab bar.  Keep electrical cords out of the way.  Do not use floor polish or wax that makes floors slippery. If you must use wax, use non-skid floor wax.  Do not have throw rugs and other things on the floor that can make you trip. What can I do with my stairs?  Do not leave any items on the stairs.  Make sure that there are handrails on both sides of the stairs and use them. Fix handrails that are broken or loose. Make sure that handrails are as long as the stairways.  Check any carpeting to make sure that it is firmly attached to the stairs. Fix any carpet that is loose or worn.  Avoid having throw rugs at the top or bottom of the stairs. If you do have throw rugs, attach them to the floor with carpet tape.  Make sure that you have a light switch at the top of the stairs and the bottom of the stairs. If you do not have them, ask someone to add them for you. What else can I do to help prevent falls?  Wear shoes that:  Do not have high heels.  Have rubber bottoms.  Are comfortable and fit you well.  Are closed at the toe. Do not wear sandals.  If you use a  stepladder:  Make sure that it is fully opened. Do not climb a closed stepladder.  Make sure that both sides of the stepladder are locked into place.  Ask someone to hold it for you, if possible.  Clearly mark and make sure that you can see:  Any grab bars or handrails.  First and last steps.  Where the edge of each step is.  Use tools that help you move around (mobility aids) if they are needed. These include:  Canes.  Walkers.  Scooters.  Crutches.  Turn on the lights when you go into a dark area. Replace any light bulbs as soon as they burn out.  Set up your furniture so you have a clear path. Avoid moving your furniture around.  If any of your floors are uneven, fix them.  If there are any pets around you, be aware of where they are.  Review your medicines with your doctor. Some medicines can make you feel dizzy. This can increase your chance of falling. Ask your doctor what other things that you can do to help prevent falls. This information is not intended to replace advice given to you by your health care provider. Make sure you discuss any questions you have with your health care provider. Document Released: 05/20/2009 Document Revised: 12/30/2015 Document Reviewed: 08/28/2014 Elsevier Interactive Patient Education  2017 [employer].

## 2018-01-07 NOTE — Assessment & Plan Note (Signed)
1 month h/o left shoulder pain with reduced mobility worsened

## 2018-01-07 NOTE — Assessment & Plan Note (Addendum)
Reports increasing size of right thyroid nodule, palpable on exam, repeat US

## 2018-01-07 NOTE — Patient Instructions (Signed)
F/U  September 4 or 5, call if you need me before  Medication changes are made as discussed  You are referred for [place] of your thyroid gland, we will call with appointment information, please get an X ray of your left shoulder when you get the thyroid US

## 2018-01-09 ENCOUNTER — Encounter: Payer: Self-pay | Admitting: Family Medicine

## 2018-01-09 NOTE — Assessment & Plan Note (Signed)
unchanged. Patient re-educated about  the importance of commitment to a  minimum of 150 minutes of exercise per week.  The importance of healthy food choices with portion control discussed. Encouraged to start a food diary, count calories and to consider  joining a support group. Sample diet sheets offered. Goals set by the patient for the next several months.   Weight /BMI 01/07/2018 01/07/2018 10/17/2017  WEIGHT 183 lb 1.3 oz 183 lb 185 lb  HEIGHT 5\' 2"  5\' 2"  5\' 2"   BMI 33.49 kg/m2 33.47 kg/m2 33.84 kg/m2

## 2018-01-09 NOTE — Assessment & Plan Note (Signed)
Controlled, no change in medication  

## 2018-01-09 NOTE — Assessment & Plan Note (Signed)
Hyperlipidemia:Low fat diet discussed and encouraged.   Lipid Panel  Lab Results  Component Value Date   CHOL 117 01/01/2018   HDL 44 01/01/2018   LDLCALC 51 01/01/2018   TRIG 108 01/01/2018   CHOLHDL 2.7 01/01/2018    Controlled, no change in medication

## 2018-01-09 NOTE — Assessment & Plan Note (Signed)
The patient's Controlled Substance registry is reviewed and compliance confirmed. Adequacy of  Pain control and level of function is assessed. Medication dosing is adjusted as deemed appropriate. Twelve weeks of medication is prescribed , patient signs for the script and is provided with a follow up appointment between 11 to 12 weeks .  

## 2018-01-09 NOTE — Progress Notes (Signed)
   Katelyn Rowland     MRN: [id number]      DOB: 05-03-1941   HPI Katelyn Rowland is here for follow up and re-evaluation of chronic medical conditions, medication management and review of any available recent lab and radiology data.  C/o increased right shoulder pain with reduced mobility, no recent trauma C/o uncontrolled muscle spasm, requests higher dose of muscle relaxant which she has been on in the past.Pain ion leg associated with spasm C/o enlarging nodule in thyroid gland , wants this checked  ROS Denies recent fever or chills. Denies sinus pressure, nasal congestion, ear pain or sore throat. Denies chest congestion, productive cough or wheezing. Denies chest pains, palpitations and leg swelling Denies abdominal pain, nausea, vomiting,diarrhea or constipation.   Denies dysuria, frequency, hesitancy or incontinence.  Denies headaches, seizures, n Denies skin break down or rash.   PE  BP 126/70   Pulse 85   Resp 16   Ht 5\' 2"  (1.575 m)   Wt 183 lb (83 kg)   SpO2 95%   BMI 33.47 kg/m   Patient alert and oriented and in no cardiopulmonary distress.  HEENT: No facial asymmetry, EOMI,   oropharynx pink and moist.  Neck decreased rOM no JVD, thyromegaly with right nodule  Chest: Clear to auscultation bilaterally.  CVS: S1, S2 no murmurs, no S3.Regular rate.  ABD: Soft non tender.   Ext: No edema  MS: Decreased  ROM spine, markedly reduced ROM right  Shoulder, reduced in  hips and knees.  Skin: Intact, no ulcerations or rash noted.  Psych: Good eye contact, normal affect. Memory intact not anxious or depressed appearing.  CNS: CN 2-12 intact, power,  normal throughout.no focal deficits noted.   Assessment & Plan  Right thyroid nodule Reports increasing size of right thyroid nodule, palpable on exam, repeat US  Chronic left shoulder pain 1 month h/o left shoulder pain with reduced mobility worsened  Essential hypertension Controlled, no change in  medication   Hypothyroidism Controlled, no change in medication   Hyperlipidemia LDL goal <100 Hyperlipidemia:Low fat diet discussed and encouraged.   Lipid Panel  Lab Results  Component Value Date   CHOL 117 01/01/2018   HDL 44 01/01/2018   LDLCALC 51 01/01/2018   TRIG 108 01/01/2018   CHOLHDL 2.7 01/01/2018    Controlled, no change in medication    Encounter for chronic pain management The patient's Controlled Substance registry is reviewed and compliance confirmed. Adequacy of  Pain control and level of function is assessed. Medication dosing is adjusted as deemed appropriate. Twelve weeks of medication is prescribed , patient signs for the script and is provided with a follow up appointment between 11 to 12 weeks .   Obesity (BMI 30.0-34.9) unchanged. Patient re-educated about  the importance of commitment to a  minimum of 150 minutes of exercise per week.  The importance of healthy food choices with portion control discussed. Encouraged to start a food diary, count calories and to consider  joining a support group. Sample diet sheets offered. Goals set by the patient for the next several months.   Weight /BMI 01/07/2018 01/07/2018 10/17/2017  WEIGHT 183 lb 1.3 oz 183 lb 185 lb  HEIGHT 5\' 2"  5\' 2"  5\' 2"   BMI 33.49 kg/m2 33.47 kg/m2 33.84 kg/m2

## 2018-01-11 ENCOUNTER — Other Ambulatory Visit: Payer: Self-pay | Admitting: Family Medicine

## 2018-01-15 ENCOUNTER — Ambulatory Visit (HOSPITAL_COMMUNITY): Admission: RE | Admit: 2018-01-15 | Payer: Medicare Other | Source: Ambulatory Visit

## 2018-01-18 ENCOUNTER — Telehealth: Payer: Self-pay

## 2018-01-18 NOTE — Telephone Encounter (Signed)
Aware to call and reschedule her [place]

## 2018-02-01 ENCOUNTER — Ambulatory Visit (HOSPITAL_COMMUNITY)
Admission: RE | Admit: 2018-02-01 | Discharge: 2018-02-01 | Disposition: A | Discharge status: Home / Self Care | Payer: Medicare Other | Source: Ambulatory Visit | Attending: Family Medicine | Admitting: Family Medicine

## 2018-02-01 DIAGNOSIS — E041 Nontoxic single thyroid nodule: Secondary | ICD-10-CM | POA: Diagnosis not present

## 2018-02-01 DIAGNOSIS — G8929 Other chronic pain: Secondary | ICD-10-CM | POA: Diagnosis not present

## 2018-02-01 DIAGNOSIS — M25512 Pain in left shoulder: Principal | ICD-10-CM

## 2018-02-01 DIAGNOSIS — M19012 Primary osteoarthritis, left shoulder: Secondary | ICD-10-CM | POA: Insufficient documentation

## 2018-02-01 DIAGNOSIS — E039 Hypothyroidism, unspecified: Secondary | ICD-10-CM | POA: Insufficient documentation

## 2018-02-05 ENCOUNTER — Other Ambulatory Visit: Payer: Self-pay | Admitting: Family Medicine

## 2018-02-13 ENCOUNTER — Other Ambulatory Visit: Payer: Self-pay | Admitting: Family Medicine

## 2018-03-13 ENCOUNTER — Other Ambulatory Visit: Payer: Self-pay | Admitting: Family Medicine

## 2018-04-10 ENCOUNTER — Ambulatory Visit: Payer: Medicare Other | Admitting: Family Medicine

## 2018-04-10 ENCOUNTER — Ambulatory Visit (INDEPENDENT_AMBULATORY_CARE_PROVIDER_SITE_OTHER): Payer: Medicare Other | Admitting: Family Medicine

## 2018-04-10 ENCOUNTER — Encounter: Payer: Self-pay | Admitting: Family Medicine

## 2018-04-10 VITALS — BP 114/78 | HR 100 | Resp 16 | Ht 62.0 in | Wt 177.1 lb

## 2018-04-10 DIAGNOSIS — G4700 Insomnia, unspecified: Secondary | ICD-10-CM | POA: Diagnosis not present

## 2018-04-10 DIAGNOSIS — E559 Vitamin D deficiency, unspecified: Secondary | ICD-10-CM

## 2018-04-10 DIAGNOSIS — I10 Essential (primary) hypertension: Secondary | ICD-10-CM | POA: Diagnosis not present

## 2018-04-10 DIAGNOSIS — E038 Other specified hypothyroidism: Secondary | ICD-10-CM | POA: Diagnosis not present

## 2018-04-10 DIAGNOSIS — Z1211 Encounter for screening for malignant neoplasm of colon: Secondary | ICD-10-CM | POA: Diagnosis not present

## 2018-04-10 DIAGNOSIS — E785 Hyperlipidemia, unspecified: Secondary | ICD-10-CM

## 2018-04-10 DIAGNOSIS — G8929 Other chronic pain: Secondary | ICD-10-CM

## 2018-04-10 DIAGNOSIS — E669 Obesity, unspecified: Secondary | ICD-10-CM | POA: Diagnosis not present

## 2018-04-10 DIAGNOSIS — E1169 Type 2 diabetes mellitus with other specified complication: Secondary | ICD-10-CM | POA: Diagnosis not present

## 2018-04-10 DIAGNOSIS — M5090 Cervical disc disorder, unspecified, unspecified cervical region: Secondary | ICD-10-CM

## 2018-04-10 MED ORDER — PREDNISONE 10 MG (21) PO TBPK: ORAL_TABLET | ORAL | 0 refills | Status: DC

## 2018-04-10 MED ORDER — METHADONE HCL 10 MG PO TABS: ORAL_TABLET | ORAL | 0 refills | Status: DC

## 2018-04-10 MED ORDER — TRAZODONE HCL 100 MG PO TABS: 100.0000 mg | ORAL_TABLET | Freq: Every day | ORAL | 5 refills | Status: DC

## 2018-04-10 NOTE — Progress Notes (Signed)
Fill Date ID Written Drug Qty Days Prescriber Rx # Pharmacy Refill Daily Dose * Pymt Type PMP  03/15/2018  1  01/07/2018  Methadone Hcl 10 Mg Tablet  90.00 30 Ma Sim  [id number]  Car ([id number])  1/1 90.00 MME Comm Ins  [place]  02/13/2018  1  01/07/2018  Methadone Hcl 10 Mg Tablet  90.00 30 Ma Sim  [id number]  Car ([id number])  1/1 90.00 MME Comm Ins  [place]  01/14/2018  1  01/07/2018  Methadone Hcl 10 Mg Tablet  90.00 30 Ma Sim  [id number]  Car ([id number])  1/1 90.00 MME Comm Ins  [place]  12/15/2017  1  10/17/2017  Methadone Hcl 10 Mg Tablet  90.00 30 Ma Sim  [id number]  Car ([id number])  1/1 90.00 MME Comm Ins  [place]  11/16/2017  1  10/17/2017  Methadone Hcl 10 Mg Tablet  90.00 30 Ma Sim  [id number]  Car ([id number])  1/1 90.00 MME Comm Ins  [place]  09/18/2017  1  07/19/2017  Methadone Hcl 10 Mg Tablet  90.00 30 Ma Sim  [id number]  Car ([id number])  1/1 90.00 MME Comm Ins  [place]  08/20/2017  1  07/19/2017  Methadone Hcl 10 Mg Tablet  90.00 30 Ma Sim  [id number]  Car ([id number])  1/1 90.00 MME Comm Ins  [place]  07/20/2017  1  07/19/2017  Methadone Hcl 10 Mg Tablet  90.00 30 Ma Sim  [id number]  Car ([id number])  1/1 90.00 MME Comm Ins  [place]  06/22/2017  1  04/11/2017  Methadone Hcl 10 Mg Tablet  90.00 30 Ma Sim  [id number]  Car ([id number])  1/1 90.00 MME Comm Ins  [place]  05/23/2017  1  04/11/2017  Methadone Hcl 10 Mg Tablet  90.00 30 Ma Sim  [id number]  Car ([id number])  1/1 90.00 MME Comm Ins  [place]  04/23/2017  1  04/11/2017  Methadone Hcl 10 Mg Tablet

## 2018-04-10 NOTE — Patient Instructions (Addendum)
F/U in 12 weeks, call if you need me before  You  Are set up for cologuard  Leave the topical hemp alone until I am sure that it is ok for you to take  Prednisone dose pack is prescribed for pain flare this past Sunday   Then  You will follow this with pered 10 mg one twice daily forup to 2 weeks  Fasting lipid, cmp and eGFR, tSH , HBA1C, cBC , and Vit D 1 week before next visit  Careful not to fall

## 2018-04-13 ENCOUNTER — Encounter: Payer: Self-pay | Admitting: Family Medicine

## 2018-04-13 MED ORDER — METHADONE HCL 10 MG PO TABS: ORAL_TABLET | ORAL | 0 refills | Status: DC

## 2018-04-13 MED ORDER — METHADONE HCL 10 MG PO TABS: ORAL_TABLET | ORAL | 0 refills | Status: AC

## 2018-04-13 MED ORDER — PREDNISONE 10 MG PO TABS: ORAL_TABLET | ORAL | 0 refills | Status: DC

## 2018-04-13 NOTE — Assessment & Plan Note (Signed)
Controlled, no change in medication Updated lab needed at/ before next visit.  

## 2018-04-13 NOTE — Assessment & Plan Note (Signed)
Sleep hygiene reviewed and written information offered also. Prescription sent for  medication needed.  

## 2018-04-13 NOTE — Assessment & Plan Note (Signed)
Hyperlipidemia:Low fat diet discussed and encouraged.   Lipid Panel  Lab Results  Component Value Date   CHOL 117 01/01/2018   HDL 44 01/01/2018   LDLCALC 51 01/01/2018   TRIG 108 01/01/2018   CHOLHDL 2.7 01/01/2018   Controlled, no change in medication Updated lab needed at/ before next visit.

## 2018-04-13 NOTE — Assessment & Plan Note (Signed)
Controlled, no change in medication DASH diet and commitment to daily physical activity for a minimum of 30 minutes discussed and encouraged, as a part of hypertension management. The importance of attaining a healthy weight is also discussed.  BP/Weight 04/10/2018 01/07/2018 01/07/2018 10/17/2017 07/19/2017 04/11/2017 1/63/8453  Systolic BP 646 803 212 248 250 037 048  Diastolic BP 78 70 70 80 82 70 62  Wt. (Lbs) 177.12 183.08 183 185 175 183.5 187  BMI 32.4 33.49 33.47 33.84 32.01 33.56 32.1

## 2018-04-13 NOTE — Assessment & Plan Note (Signed)
The patient's Controlled Substance registry is reviewed and compliance confirmed. Adequacy of  Pain control and level of function is assessed. Medication dosing is adjusted as deemed appropriate. Twelve weeks of medication is prescribed , patient signs for the script and is provided with a follow up appointment between 11 to 12 weeks .  

## 2018-04-13 NOTE — Progress Notes (Signed)
Katelyn Rowland     MRN: [id number]      DOB: [birth year]   HPI Katelyn Rowland is here for follow up and re-evaluation of chronic medical conditions, medication management and review of any available recent lab and radiology data.  Refuses flu vaccine, agrees to colo guard , does not want mammmogram.   4 day h/o acute neck and LUE pain, has been using hemp ointment for relief but is concerned about possible adverse drug interaction should it show abn in UDS and asks to have this checked. Does respond  To and is able to tolerate oral prednisone, but not Im steroid, also requests access to twice daily prednisone for short spells if needed for flare of pain.   ROS Denies recent fever or chills. Denies sinus pressure, nasal congestion, ear pain or sore throat. Denies chest congestion, productive cough or wheezing. Denies chest pains, palpitations and leg swelling Denies abdominal pain, nausea, vomiting,diarrhea or constipation.No change in BM noted   Denies dysuria, frequency, hesitancy or incontinence.  Denies headaches, seizures,c/o upper and lower extremity  numbness, and  tingling. Denies depression, anxiety chronic  Insomnia.managed with trazodone Denies skin break down or rash.   PE  BP 114/78   Pulse 100   Resp 16   Ht 5\' 2"  (1.575 m)   Wt 177 lb 1.9 oz (80.3 kg)   SpO2 95%   BMI 32.40 kg/m   Patient alert and oriented and in no cardiopulmonary distress.  HEENT: No facial asymmetry, EOMI,   oropharynx pink and moist.  Neck decreased ROM no JVD, no mass.  Chest: Clear to auscultation bilaterally.  CVS: S1, S2 no murmurs, no S3.Regular rate.  ABD: Soft non tender.   Ext: No edema  MS: Decreased  ROM spine, shoulders, hips and knees.  Skin: Intact, no ulcerations or rash noted.  Psych: Good eye contact, normal affect. Memory intact not anxious or depressed appearing.  CNS: CN 2-12 intact, power,  normal throughout.no focal deficits noted.   Assessment &  Plan  Encounter for chronic pain management The patient's Controlled Substance registry is reviewed and compliance confirmed. Adequacy of  Pain control and level of function is assessed. Medication dosing is adjusted as deemed appropriate. Twelve weeks of medication is prescribed , patient signs for the script and is provided with a follow up appointment between 11 to 12 weeks .   Cervical neck pain with evidence of disc disease 4 day acute flare, prednisone dose pack prescribed, then judicious use of prednisone 10 mg twice daily, 30 tabs to last 12 weeks  Essential hypertension Controlled, no change in medication DASH diet and commitment to daily physical activity for a minimum of 30 minutes discussed and encouraged, as a part of hypertension management. The importance of attaining a healthy weight is also discussed.  BP/Weight 04/10/2018 01/07/2018 01/07/2018 10/17/2017 07/19/2017 04/11/2017 0/92/3300  Systolic BP 762 263 335 456 256 389 373  Diastolic BP 78 70 70 80 82 70 62  Wt. (Lbs) 177.12 183.08 183 185 175 183.5 187  BMI 32.4 33.49 33.47 33.84 32.01 33.56 32.1       INSOMNIA, CHRONIC Sleep hygiene reviewed and written information offered also. Prescription sent for  medication needed.   Hypothyroidism Controlled, no change in medication Updated lab needed at/ before next visit.   Hyperlipidemia LDL goal <100 Hyperlipidemia:Low fat diet discussed and encouraged.   Lipid Panel  Lab Results  Component Value Date   CHOL 117 01/01/2018   HDL 44  01/01/2018   LDLCALC 51 01/01/2018   TRIG 108 01/01/2018   CHOLHDL 2.7 01/01/2018   Controlled, no change in medication Updated lab needed at/ before next visit.

## 2018-04-13 NOTE — Assessment & Plan Note (Signed)
4 day acute flare, prednisone dose pack prescribed, then judicious use of prednisone 10 mg twice daily, 30 tabs to last 12 weeks

## 2018-04-23 ENCOUNTER — Other Ambulatory Visit: Payer: Self-pay | Admitting: Family Medicine

## 2018-05-07 DIAGNOSIS — Z1211 Encounter for screening for malignant neoplasm of colon: Secondary | ICD-10-CM | POA: Diagnosis not present

## 2018-05-16 ENCOUNTER — Telehealth: Payer: Self-pay | Admitting: Family Medicine

## 2018-05-16 NOTE — Telephone Encounter (Signed)
Spoke with patient and advised her of her Cologuard coming back positive and she needed to be seen by gastro for a structural colonoscopy. Patient refuses referral to gastro stating that she has always said if she ever got cancer she would never take cancer treatments.

## 2018-05-16 NOTE — Telephone Encounter (Signed)
pls call let her know of positive result, needs cpoolonoscopy, and refer her to her gI Doc of choice  For this if she agrees pls

## 2018-05-17 NOTE — Telephone Encounter (Signed)
I also spoke with Katelyn Rowland who confirmed clearly that she wants no further testing, and has expressed these wishes on more than one occasion in the past and has a DNR form

## 2018-06-18 ENCOUNTER — Other Ambulatory Visit: Payer: Self-pay | Admitting: Family Medicine

## 2018-06-18 DIAGNOSIS — N183 Chronic kidney disease, stage 3 (moderate): Secondary | ICD-10-CM | POA: Diagnosis not present

## 2018-06-18 DIAGNOSIS — Z79899 Other long term (current) drug therapy: Secondary | ICD-10-CM | POA: Diagnosis not present

## 2018-06-18 DIAGNOSIS — I10 Essential (primary) hypertension: Secondary | ICD-10-CM | POA: Diagnosis not present

## 2018-06-18 DIAGNOSIS — R809 Proteinuria, unspecified: Secondary | ICD-10-CM | POA: Diagnosis not present

## 2018-06-18 DIAGNOSIS — E559 Vitamin D deficiency, unspecified: Secondary | ICD-10-CM | POA: Diagnosis not present

## 2018-06-18 DIAGNOSIS — D509 Iron deficiency anemia, unspecified: Secondary | ICD-10-CM | POA: Diagnosis not present

## 2018-06-19 ENCOUNTER — Other Ambulatory Visit: Payer: Self-pay | Admitting: Family Medicine

## 2018-06-19 LAB — CBC/DIFF AMBIGUOUS DEFAULT
BASOS ABS: 0.1 10*3/uL (ref 0.0–0.2)
Basos: 1 %
EOS (ABSOLUTE): 0.3 10*3/uL (ref 0.0–0.4)
Eos: 4 %
Hematocrit: 38.6 % (ref 34.0–46.6)
Hemoglobin: 13 g/dL (ref 11.1–15.9)
IMMATURE GRANS (ABS): 0 10*3/uL (ref 0.0–0.1)
Immature Granulocytes: 0 %
LYMPHS: 26 %
Lymphocytes Absolute: 1.7 10*3/uL (ref 0.7–3.1)
MCH: 31.8 pg (ref 26.6–33.0)
MCHC: 33.7 g/dL (ref 31.5–35.7)
MCV: 94 fL (ref 79–97)
Monocytes Absolute: 0.6 10*3/uL (ref 0.1–0.9)
Monocytes: 8 %
Neutrophils Absolute: 4.1 10*3/uL (ref 1.4–7.0)
Neutrophils: 61 %
Platelets: 176 10*3/uL (ref 150–450)
RBC: 4.09 x10E6/uL (ref 3.77–5.28)
RDW: 11.9 % — ABNORMAL LOW (ref 12.3–15.4)
WBC: 6.8 10*3/uL (ref 3.4–10.8)

## 2018-06-19 LAB — COMPREHENSIVE METABOLIC PANEL
ALBUMIN: 4.3 g/dL (ref 3.5–4.8)
ALT: 15 IU/L (ref 0–32)
AST: 16 IU/L (ref 0–40)
Albumin/Globulin Ratio: 2 (ref 1.2–2.2)
Alkaline Phosphatase: 50 IU/L (ref 39–117)
BUN / CREAT RATIO: 11 — AB (ref 12–28)
BUN: 20 mg/dL (ref 8–27)
Bilirubin Total: 0.4 mg/dL (ref 0.0–1.2)
CALCIUM: 9.5 mg/dL (ref 8.7–10.3)
CO2: 24 mmol/L (ref 20–29)
CREATININE: 1.78 mg/dL — AB (ref 0.57–1.00)
Chloride: 103 mmol/L (ref 96–106)
GFR calc non Af Amer: 27 mL/min/{1.73_m2} — ABNORMAL LOW (ref >59)
GFR, EST AFRICAN AMERICAN: 31 mL/min/{1.73_m2} — AB (ref >59)
GLUCOSE: 84 mg/dL (ref 65–99)
Globulin, Total: 2.1 g/dL (ref 1.5–4.5)
Potassium: 5 mmol/L (ref 3.5–5.2)
Sodium: 141 mmol/L (ref 134–144)
TOTAL PROTEIN: 6.4 g/dL (ref 6.0–8.5)

## 2018-06-19 LAB — SPECIMEN STATUS REPORT

## 2018-06-19 LAB — LIPID PANEL W/O CHOL/HDL RATIO
Cholesterol, Total: 116 mg/dL (ref 100–199)
HDL: 45 mg/dL (ref >39)
LDL CALC: 45 mg/dL (ref 0–99)
Triglycerides: 128 mg/dL (ref 0–149)
VLDL Cholesterol Cal: 26 mg/dL (ref 5–40)

## 2018-06-19 LAB — HGB A1C W/O EAG: Hgb A1c MFr Bld: 5.5 % (ref 4.8–5.6)

## 2018-06-19 LAB — TSH: TSH: 0.158 u[IU]/mL — AB (ref 0.450–4.500)

## 2018-06-19 MED ORDER — LEVOTHYROXINE SODIUM 100 MCG PO TABS: 100.0000 ug | ORAL_TABLET | Freq: Every day | ORAL | 5 refills | Status: DC

## 2018-06-19 NOTE — Progress Notes (Signed)
Spoke with patient's daughter, gave her the message. Patient did not answer the phone, but did leave her a message

## 2018-06-19 NOTE — Progress Notes (Signed)
Spoke with patient to confirm

## 2018-06-27 DIAGNOSIS — N184 Chronic kidney disease, stage 4 (severe): Secondary | ICD-10-CM | POA: Diagnosis not present

## 2018-06-27 DIAGNOSIS — R809 Proteinuria, unspecified: Secondary | ICD-10-CM | POA: Diagnosis not present

## 2018-06-27 DIAGNOSIS — N2581 Secondary hyperparathyroidism of renal origin: Secondary | ICD-10-CM | POA: Diagnosis not present

## 2018-06-27 DIAGNOSIS — E875 Hyperkalemia: Secondary | ICD-10-CM | POA: Diagnosis not present

## 2018-07-10 DIAGNOSIS — E875 Hyperkalemia: Secondary | ICD-10-CM | POA: Diagnosis not present

## 2018-07-11 ENCOUNTER — Ambulatory Visit (INDEPENDENT_AMBULATORY_CARE_PROVIDER_SITE_OTHER): Payer: Medicare Other | Admitting: Family Medicine

## 2018-07-11 ENCOUNTER — Encounter: Payer: Self-pay | Admitting: Family Medicine

## 2018-07-11 VITALS — BP 120/68 | HR 83 | Resp 12 | Ht 62.0 in | Wt 174.0 lb

## 2018-07-11 DIAGNOSIS — E785 Hyperlipidemia, unspecified: Secondary | ICD-10-CM

## 2018-07-11 DIAGNOSIS — E669 Obesity, unspecified: Secondary | ICD-10-CM | POA: Diagnosis not present

## 2018-07-11 DIAGNOSIS — N184 Chronic kidney disease, stage 4 (severe): Secondary | ICD-10-CM

## 2018-07-11 DIAGNOSIS — G8929 Other chronic pain: Secondary | ICD-10-CM | POA: Diagnosis not present

## 2018-07-11 DIAGNOSIS — E038 Other specified hypothyroidism: Secondary | ICD-10-CM

## 2018-07-11 DIAGNOSIS — E1169 Type 2 diabetes mellitus with other specified complication: Secondary | ICD-10-CM

## 2018-07-11 MED ORDER — METHADONE HCL 10 MG PO TABS: ORAL_TABLET | ORAL | 0 refills | Status: AC

## 2018-07-11 MED ORDER — PREDNISONE 10 MG PO TABS: ORAL_TABLET | ORAL | 0 refills | Status: DC

## 2018-07-11 NOTE — Patient Instructions (Addendum)
F/U appt week of Feb 22, call if you need me sooner  TSH  To be drawn in Feb, collect lab oprder at checkout   Medication is sent in   Kidneys work the same as in 08/2017  Your potassium  is normal, you just need to choose foods and drinks from the low potassium end of the grocery list   Carefulnot to fall  Foot exam done today

## 2018-07-11 NOTE — Progress Notes (Signed)
Fill Date ID Written Drug Qty Days Prescriber Rx # Pharmacy Refill Daily Dose * Pymt Type PMP  06/14/2018  1   04/13/2018  Methadone Hcl 10 Mg Tablet  90.00 30 Ma Sim  [id number]  Car ([id number])  0/0 90.00 MME Comm Ins  [place]  05/14/2018  1   04/13/2018  Methadone Hcl 10 Mg Tablet  90.00 30 Ma Sim  [id number]  Car ([id number])  0/0 90.00 MME Comm Ins  [place]  04/12/2018  1   04/10/2018  Methadone Hcl 10 Mg Tablet  90.00 30 Ma Sim  [id number]  Car ([id number])  0/0 90.00 MME Comm Ins  [place]  03/15/2018  1   01/07/2018  Methadone Hcl 10 Mg Tablet  90.00 30 Ma Sim  [id number]  Car ([id number])  0/0 90.00 MME Comm Ins  [place]  02/13/2018  1   01/07/2018  Methadone Hcl 10 Mg Tablet  90.00 30 Ma Sim  [id number]  Car ([id number])  0/0 90.00 MME Comm Ins  [place]  01/14/2018  1   01/07/2018  Methadone Hcl 10 Mg Tablet  90.00 30 Ma Sim  [id number]  Car ([id number])  0/0 90.00 MME Comm Ins  [place]  12/15/2017  1   10/17/2017  Methadone Hcl 10 Mg Tablet  90.00 30 Ma Sim  [id number]  Car ([id number])  0/0 90.00 MME Comm Ins  [place]  11/16/2017  1   10/17/2017  Methadone Hcl 10 Mg Tablet  90.00 30 Ma Sim  [id number]  Car ([id number])  0/0 90.00 MME Comm Ins  [place]  09/18/2017  1   07/19/2017  Methadone Hcl 10 Mg Tablet  90.00 30 Ma Sim  [id number]  Car ([id number])  0/0 90.00 MME Comm Ins    08/20/2017  1   07/19/2017  Methadone Hcl 10 Mg Tablet  90.00 30 Ma Sim  [id number]  Car ([id number])  0/0 90.00 MME Comm Ins    07/20/2017  1   07/19/2017  Methadone Hcl 10 Mg Tablet  90.00 30 Ma Sim  [id number]  Car ([id number])  0/0 90

## 2018-07-12 ENCOUNTER — Telehealth: Payer: Self-pay | Admitting: *Deleted

## 2018-07-12 NOTE — Telephone Encounter (Signed)
pls give the oK to deliver today

## 2018-07-12 NOTE — Telephone Encounter (Signed)
Pt said she takes methadone for pain. She has enough to last her to Saturday night. She said there was prescription called in yesterday but they can not fill it early without and ok from the office and they do not deliver on weekends. This is the only way she can get her medications. She said if it is ok they can deliver today.

## 2018-07-12 NOTE — Telephone Encounter (Signed)
Spoke with pharmacy and let them know that per Dr.Simpson it is ok to deliver Methadone today

## 2018-07-22 ENCOUNTER — Other Ambulatory Visit: Payer: Self-pay | Admitting: Family Medicine

## 2018-07-26 ENCOUNTER — Encounter: Payer: Self-pay | Admitting: Family Medicine

## 2018-07-26 NOTE — Assessment & Plan Note (Signed)
The patient's Controlled Substance registry is reviewed and compliance confirmed. Adequacy of  Pain control and level of function is assessed. Medication dosing is adjusted as deemed appropriate. Twelve weeks of medication is prescribed , patient signs for the script and is provided with a follow up appointment between 11 to 12 weeks .  

## 2018-07-26 NOTE — Assessment & Plan Note (Signed)
Improved Patient re-educated about  the importance of commitment to a  minimum of 150 minutes of exercise per week.  The importance of healthy food choices with portion control discussed. Encouraged to start a food diary, count calories and to consider  joining a support group. Sample diet sheets offered. Goals set by the patient for the next several months.   Weight /BMI 07/11/2018 04/10/2018 01/07/2018  WEIGHT 174 lb 0.6 oz 177 lb 1.9 oz 183 lb 1.3 oz  HEIGHT 5\' 2"  5\' 2"  5\' 2"   BMI 31.83 kg/m2 32.4 kg/m2 33.49 kg/m2

## 2018-07-26 NOTE — Progress Notes (Signed)
   Katelyn Rowland     MRN: [id number]      DOB: 1940-09-02   HPI Katelyn Rowland is here for follow up and re-evaluation of chronic medical conditions, medication management and review of any available recent lab and radiology data.  Preventive health is updated, specifically  Cancer screening and Immunization.   Questions or concerns regarding consultations or procedures which the PT has had in the interim are  addressed. The PT denies any adverse reactions to current medications since the last visit.  There are no new concerns.  There are no specific complaints   ROS Denies recent fever or chills. Denies sinus pressure, nasal congestion, ear pain or sore throat. Denies chest congestion, productive cough or wheezing. Denies chest pains, palpitations and leg swelling Denies abdominal pain, nausea, vomiting,diarrhea or constipation.   Denies dysuria, frequency, hesitancy or incontinence. C/o joint pain, swelling and limitation in mobility. Denies headaches, seizures, numbness, or tingling. Denies depression, anxiety or insomnia. Denies skin break down or rash.   PE  BP 120/68 (BP Location: Left Arm, Patient Position: Sitting, Cuff Size: Large)   Pulse 83   Resp 12   Ht 5\' 2"  (1.575 m)   Wt 174 lb 0.6 oz (78.9 kg)   SpO2 96% Comment: room air  BMI 31.83 kg/m   Patient alert and oriented and in no cardiopulmonary distress.  HEENT: No facial asymmetry, EOMI,   oropharynx pink and moist.  Neck supple no JVD, no mass.  Chest: Clear to auscultation bilaterally.  CVS: S1, S2 no murmurs, no S3.Regular rate.  ABD: Soft non tender.   Ext: No edema  MS: decreased  ROM spine, , hips and knees.  Skin: Intact, no ulcerations or rash noted.  Psych: Good eye contact, normal affect. Memory intact not anxious or depressed appearing.  CNS: CN 2-12 intact, power,  normal throughout.no focal deficits noted.   Assessment & Plan  Encounter for chronic pain management The patient's  Controlled Substance registry is reviewed and compliance confirmed. Adequacy of  Pain control and level of function is assessed. Medication dosing is adjusted as deemed appropriate. Twelve weeks of medication is prescribed , patient signs for the script and is provided with a follow up appointment between 11 to 12 weeks .   CKD (chronic kidney disease) stage 4, GFR 15-29 ml/min Stable followed by nephrology  Hypothyroidism Controlled, no change in medication   Hyperlipidemia LDL goal <100 Hyperlipidemia:Low fat diet discussed and encouraged.   Lipid Panel  Lab Results  Component Value Date   CHOL 116 06/18/2018   HDL 45 06/18/2018   LDLCALC 45 06/18/2018   TRIG 128 06/18/2018   CHOLHDL 2.7 01/01/2018    Controlled, no change in medication    Obesity (BMI 30.0-34.9) Improved Patient re-educated about  the importance of commitment to a  minimum of 150 minutes of exercise per week.  The importance of healthy food choices with portion control discussed. Encouraged to start a food diary, count calories and to consider  joining a support group. Sample diet sheets offered. Goals set by the patient for the next several months.   Weight /BMI 07/11/2018 04/10/2018 01/07/2018  WEIGHT 174 lb 0.6 oz 177 lb 1.9 oz 183 lb 1.3 oz  HEIGHT 5\' 2"  5\' 2"  5\' 2"   BMI 31.83 kg/m2 32.4 kg/m2 33.49 kg/m2      Diabetes mellitus type 2 in obese (HCC) Diet controlled with normal HBA1C

## 2018-07-26 NOTE — Assessment & Plan Note (Signed)
Hyperlipidemia:Low fat diet discussed and encouraged.   Lipid Panel  Lab Results  Component Value Date   CHOL 116 06/18/2018   HDL 45 06/18/2018   LDLCALC 45 06/18/2018   TRIG 128 06/18/2018   CHOLHDL 2.7 01/01/2018    Controlled, no change in medication

## 2018-07-26 NOTE — Assessment & Plan Note (Signed)
Stable followed by nephrology 

## 2018-07-26 NOTE — Assessment & Plan Note (Signed)
Controlled, no change in medication  

## 2018-07-26 NOTE — Assessment & Plan Note (Signed)
Diet controlled with normal HBA1C

## 2018-08-21 ENCOUNTER — Other Ambulatory Visit: Payer: Self-pay | Admitting: Family Medicine

## 2018-09-19 ENCOUNTER — Other Ambulatory Visit: Payer: Self-pay | Admitting: Family Medicine

## 2018-09-27 ENCOUNTER — Other Ambulatory Visit: Payer: Self-pay | Admitting: Family Medicine

## 2018-09-28 ENCOUNTER — Other Ambulatory Visit: Payer: Self-pay | Admitting: Family Medicine

## 2018-09-30 ENCOUNTER — Other Ambulatory Visit: Payer: Self-pay | Admitting: Family Medicine

## 2018-09-30 DIAGNOSIS — R809 Proteinuria, unspecified: Secondary | ICD-10-CM | POA: Diagnosis not present

## 2018-09-30 DIAGNOSIS — N183 Chronic kidney disease, stage 3 (moderate): Secondary | ICD-10-CM | POA: Diagnosis not present

## 2018-09-30 DIAGNOSIS — E038 Other specified hypothyroidism: Secondary | ICD-10-CM | POA: Diagnosis not present

## 2018-09-30 DIAGNOSIS — Z79899 Other long term (current) drug therapy: Secondary | ICD-10-CM | POA: Diagnosis not present

## 2018-09-30 DIAGNOSIS — I10 Essential (primary) hypertension: Secondary | ICD-10-CM | POA: Diagnosis not present

## 2018-09-30 DIAGNOSIS — E559 Vitamin D deficiency, unspecified: Secondary | ICD-10-CM | POA: Diagnosis not present

## 2018-09-30 DIAGNOSIS — D509 Iron deficiency anemia, unspecified: Secondary | ICD-10-CM | POA: Diagnosis not present

## 2018-10-01 LAB — TSH: TSH: 2.32 u[IU]/mL (ref 0.450–4.500)

## 2018-10-02 ENCOUNTER — Ambulatory Visit (INDEPENDENT_AMBULATORY_CARE_PROVIDER_SITE_OTHER): Payer: Medicare Other | Admitting: Family Medicine

## 2018-10-02 ENCOUNTER — Encounter: Payer: Self-pay | Admitting: Family Medicine

## 2018-10-02 VITALS — BP 138/84 | HR 81 | Resp 12 | Ht 62.0 in | Wt 179.0 lb

## 2018-10-02 DIAGNOSIS — M20009 Unspecified deformity of unspecified finger(s): Secondary | ICD-10-CM

## 2018-10-02 DIAGNOSIS — M20002 Unspecified deformity of left finger(s): Secondary | ICD-10-CM

## 2018-10-02 DIAGNOSIS — M20001 Unspecified deformity of right finger(s): Secondary | ICD-10-CM

## 2018-10-02 DIAGNOSIS — G8929 Other chronic pain: Secondary | ICD-10-CM | POA: Diagnosis not present

## 2018-10-02 DIAGNOSIS — I10 Essential (primary) hypertension: Secondary | ICD-10-CM | POA: Diagnosis not present

## 2018-10-02 DIAGNOSIS — E669 Obesity, unspecified: Secondary | ICD-10-CM

## 2018-10-02 DIAGNOSIS — R52 Pain, unspecified: Secondary | ICD-10-CM

## 2018-10-02 DIAGNOSIS — E559 Vitamin D deficiency, unspecified: Secondary | ICD-10-CM | POA: Diagnosis not present

## 2018-10-02 DIAGNOSIS — E038 Other specified hypothyroidism: Secondary | ICD-10-CM

## 2018-10-02 DIAGNOSIS — E785 Hyperlipidemia, unspecified: Secondary | ICD-10-CM | POA: Diagnosis not present

## 2018-10-02 DIAGNOSIS — M2550 Pain in unspecified joint: Secondary | ICD-10-CM | POA: Insufficient documentation

## 2018-10-02 DIAGNOSIS — M25512 Pain in left shoulder: Secondary | ICD-10-CM

## 2018-10-02 DIAGNOSIS — Z9181 History of falling: Secondary | ICD-10-CM

## 2018-10-02 MED ORDER — TIZANIDINE HCL 4 MG PO CAPS: ORAL_CAPSULE | ORAL | 5 refills | Status: DC

## 2018-10-02 MED ORDER — PREDNISONE 10 MG PO TABS: ORAL_TABLET | ORAL | 0 refills | Status: DC

## 2018-10-02 MED ORDER — METHADONE HCL 10 MG PO TABS: ORAL_TABLET | ORAL | 0 refills | Status: AC

## 2018-10-02 MED ORDER — PREDNISONE 10 MG PO TABS: 10.0000 mg | ORAL_TABLET | Freq: Two times a day (BID) | ORAL | 0 refills | Status: AC

## 2018-10-02 MED ORDER — METHADONE HCL 10 MG PO TABS: ORAL_TABLET | ORAL | 0 refills | Status: DC

## 2018-10-02 NOTE — Patient Instructions (Addendum)
F/U in 11 weeks, call if you need me before  Fasting lipid, cmp and EGFr and Vit D 1 week before next visit, pls drink water even though fasting You are referred for to orthopedic Doc re left shoulder  You are referred to Rheumatology re bil;lateral hand and pain and joint swelling   New for muscle spasm is zanaflex once you finish the cyclobenzaprine  Please take prednisone one twice daily for 5 days , then only if needed  Be careful not to fall  Thank you  for choosing [facility]. We consider it a privelige to serve you.  Delivering excellent health care in a caring and  compassionate way is our goal.  Partnering with you,  so that together we can achieve this goal is our strategy.      

## 2018-10-02 NOTE — Assessment & Plan Note (Addendum)
Deformity and debility of both hands, fingers have nodules, function in both hands is decreasing Refer to Rheumatology

## 2018-10-02 NOTE — Assessment & Plan Note (Signed)
Progressive pain and diminshed mobility in left shoulder x 3 months, affects sleep, needs ortho referral

## 2018-10-03 DIAGNOSIS — M20002 Unspecified deformity of left finger(s): Secondary | ICD-10-CM | POA: Insufficient documentation

## 2018-10-03 DIAGNOSIS — M20001 Unspecified deformity of right finger(s): Secondary | ICD-10-CM | POA: Insufficient documentation

## 2018-10-03 NOTE — Assessment & Plan Note (Signed)
The patient's Controlled Substance registry is reviewed and compliance confirmed. Adequacy of  Pain control and level of function is assessed. Medication dosing is adjusted as deemed appropriate. Twelve weeks of medication is prescribed , patient signs for the script and is provided with a follow up appointment between 11 to 12 weeks .  

## 2018-10-03 NOTE — Assessment & Plan Note (Signed)
Controlled, no change in medication  

## 2018-10-03 NOTE — Progress Notes (Signed)
Katelyn Rowland     MRN: [id number]      DOB: 1940/11/22   HPI Katelyn Rowland is here for follow up and re-evaluation of chronic medical conditions, medication management and review of any available recent lab and radiology data.  Preventive health is updated, specifically  Cancer screening and Immunization.   Questions or concerns regarding consultations or procedures which the PT has had in the interim are  addressed. The PT denies any adverse reactions to current medications since the last visit.  C/o increased jont pain and swelling wiith deformity and nodules in fingers, weak grip in left hand, significant back, shoulder , hip and knee pain Marked limitation of left shoulder mobility, no recent trauma   ROS Denies recent fever or chills. Denies sinus pressure, nasal congestion, ear pain or sore throat. Denies chest congestion, productive cough or wheezing. Denies chest pains, palpitations and leg swelling Denies abdominal pain, nausea, vomiting,diarrhea or constipation.   Denies dysuria, frequency, hesitancy or incontinence. . Denies headaches, seizures,  Denies depression, anxiety or insomnia. Denies skin break down or rash.   PE  BP 138/84   Pulse 81   Resp 12   Ht 5\' 2"  (1.575 m)   Wt 179 lb (81.2 kg)   SpO2 97% Comment: room air  BMI 32.74 kg/m   Patient alert and oriented and in no cardiopulmonary distress.  HEENT: No facial asymmetry, EOMI,   oropharynx pink and moist.  Neck decreased ROM no JVD, no mass.  Chest: Clear to auscultation bilaterally.  CVS: S1, S2 no murmurs, no S3.Regular rate.  ABD: Soft non tender.   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  Psych: Good eye contact, normal affect. Memory intact not anxious or depressed appearing.  CNS: CN 2-12  intact, power,  normal throughout.no focal deficits noted.   Assessment & Plan  Chronic left shoulder pain Progressive pain and diminshed mobility in left shoulder x 3 months, affects sleep, needs ortho referral  Generalized pain Deformity and debility of both hands, fingers have nodules, function in both hands is decreasing Refer to Rheumatology  Hypothyroidism Controlled, no change in medication   At high risk for falls Home safety reviewed and use of assistive device  Encounter for chronic pain management The patient's Controlled Substance registry is reviewed and compliance confirmed. Adequacy of  Pain control and level of function is assessed. Medication dosing is adjusted as deemed appropriate. Twelve weeks of medication is prescribed , patient signs for the script and is provided with a follow up appointment between 11 to 12 weeks .   Essential hypertension Controlled, no change in medication DASH diet and commitment to daily physical activity for a minimum of 30 minutes discussed and encouraged, as a part of hypertension management. The importance of attaining a healthy weight is also discussed.  BP/Weight 10/02/2018 07/11/2018 04/10/2018 01/07/2018 01/07/2018 10/17/2017 27/25/3664  Systolic BP 403 474 259 563 875 643 329  Diastolic BP 84 68 78 70 70 80 82  Wt. (Lbs) 179 174.04 177.12 183.08 183 185 175  BMI 32.74 31.83 32.4 33.49 33.47 33.84 32.01       Obesity (BMI 30.0-34.9) Deteriorated.  Patient re-educated about  the importance of commitment to a   exercise  as able.  The importance of healthy food choices  with portion control discussed, as well as eating regularly and within a 12 hour window most days. The need to choose "clean , green" food 50 to 75% of the time is discussed, as well as to make water the primary drink and set a goal of 64 ounces water daily.  Encouraged to start a food diary,  and to consider  joining a support group. Sample diet sheets  offered. Goals set by the patient for the next several months.   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

## 2018-10-03 NOTE — Assessment & Plan Note (Signed)
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

## 2018-10-03 NOTE — Assessment & Plan Note (Signed)
Controlled, no change in medication DASH diet and commitment to daily physical activity for a minimum of 30 minutes discussed and encouraged, as a part of hypertension management. The importance of attaining a healthy weight is also discussed.  BP/Weight 10/02/2018 07/11/2018 04/10/2018 01/07/2018 01/07/2018 10/17/2017 28/24/1753  Systolic BP 010 404 591 368 599 234 144  Diastolic BP 84 68 78 70 70 80 82  Wt. (Lbs) 179 174.04 177.12 183.08 183 185 175  BMI 32.74 31.83 32.4 33.49 33.47 33.84 32.01

## 2018-10-03 NOTE — Assessment & Plan Note (Signed)
Home safety reviewed and use of assistive device

## 2018-10-03 NOTE — Assessment & Plan Note (Signed)
Deteriorated.  Patient re-educated about  the importance of commitment to a   exercise  as able.  The importance of healthy food choices with portion control discussed, as well as eating regularly and within a 12 hour window most days. The need to choose "clean , green" food 50 to 75% of the time is discussed, as well as to make water the primary drink and set a goal of 64 ounces water daily.  Encouraged to start a food diary,  and to consider  joining a support group. Sample diet sheets offered. Goals set by the patient for the next several months.   Weight /BMI 10/02/2018 07/11/2018 04/10/2018  WEIGHT 179 lb 174 lb 0.6 oz 177 lb 1.9 oz  HEIGHT 5\' 2"  5\' 2"  5\' 2"   BMI 32.74 kg/m2 31.83 kg/m2 32.4 kg/m2

## 2018-10-15 ENCOUNTER — Other Ambulatory Visit: Payer: Self-pay

## 2018-10-15 MED ORDER — TIZANIDINE HCL 4 MG PO CAPS: ORAL_CAPSULE | ORAL | 5 refills | Status: DC

## 2018-10-16 ENCOUNTER — Ambulatory Visit: Payer: Medicare Other | Admitting: Orthopedic Surgery

## 2018-11-20 ENCOUNTER — Other Ambulatory Visit: Payer: Self-pay | Admitting: Family Medicine

## 2018-12-01 ENCOUNTER — Encounter: Payer: Self-pay | Admitting: Orthopedic Surgery

## 2018-12-24 ENCOUNTER — Ambulatory Visit (INDEPENDENT_AMBULATORY_CARE_PROVIDER_SITE_OTHER): Payer: Medicare Other | Admitting: Family Medicine

## 2018-12-24 ENCOUNTER — Encounter: Payer: Self-pay | Admitting: Family Medicine

## 2018-12-24 VITALS — BP 138/84 | Ht 62.0 in | Wt 180.0 lb

## 2018-12-24 DIAGNOSIS — E669 Obesity, unspecified: Secondary | ICD-10-CM

## 2018-12-24 DIAGNOSIS — E041 Nontoxic single thyroid nodule: Secondary | ICD-10-CM | POA: Diagnosis not present

## 2018-12-24 DIAGNOSIS — G8929 Other chronic pain: Secondary | ICD-10-CM | POA: Diagnosis not present

## 2018-12-24 DIAGNOSIS — Z7189 Other specified counseling: Secondary | ICD-10-CM | POA: Diagnosis not present

## 2018-12-24 DIAGNOSIS — I10 Essential (primary) hypertension: Secondary | ICD-10-CM | POA: Diagnosis not present

## 2018-12-24 DIAGNOSIS — E785 Hyperlipidemia, unspecified: Secondary | ICD-10-CM

## 2018-12-24 DIAGNOSIS — E038 Other specified hypothyroidism: Secondary | ICD-10-CM | POA: Diagnosis not present

## 2018-12-24 MED ORDER — ROSUVASTATIN CALCIUM 20 MG PO TABS: 20.0000 mg | ORAL_TABLET | Freq: Every day | ORAL | 3 refills | Status: DC

## 2018-12-24 MED ORDER — METHADONE HCL 10 MG PO TABS: ORAL_TABLET | ORAL | 0 refills | Status: AC

## 2018-12-24 MED ORDER — PREDNISONE 10 MG PO TABS: ORAL_TABLET | ORAL | 0 refills | Status: DC

## 2018-12-24 MED ORDER — PREDNISONE 10 MG PO TABS: 10.0000 mg | ORAL_TABLET | Freq: Two times a day (BID) | ORAL | 0 refills | Status: AC

## 2018-12-24 NOTE — Patient Instructions (Addendum)
F/u in office last week in August , call if you need me before  Wellness due after June 22 please schedule  Medications are prescribed as we discussed Thankful that you are doing well.  Continue to be careful not to fall   Please get Fasting lipid, cmp and EGFr and TSH 1 week before follow up visit  Thanks for choosing  Primary Care, we consider it a privelige to serve you.   Social distancing. Frequent hand washing with soap and water Keeping your hands off of your face. These 3 practices will help to keep both you and your community healthy during this time. Please practice them faithfully!

## 2018-12-24 NOTE — Progress Notes (Signed)
Virtual Visit via Telephone Note  I connected with Katelyn Rowland on 12/24/18 at  1:40 PM EDT by telephone and verified that I am speaking with the correct person using two identifiers.  Location: Patient: home Provider: office   I discussed the limitations, risks, security and privacy concerns of performing an evaluation and management service by telephone and the availability of in person appointments. I also discussed with the patient that there may be a patient responsible charge related to this service. The patient expressed understanding and agreed to proceed. This visit type is conducted due to national recommendations for restrictions regarding the COVID -19 Pandemic. Due to the patient's age and / or co morbidities, this format is felt to be most appropriate at this time without adequate follow up. The patient has no access to video technology/ had technical difficulties with video, requiring transitioning to audio format  only ( telephone ). All issues noted this document were discussed and addressed,no physical exam can be performed in this format.   History of Present Illness: F/U chronic problems , and medication management  in particular chronic pain management, states that pain control is adequate Denies recent fever or chills. Denies sinus pressure, nasal congestion, ear pain or sore throat. Denies chest congestion, productive cough or wheezing. Denies chest pains, palpitations and leg swelling Denies abdominal pain, nausea, vomiting,diarrhea or constipation.   Denies dysuria, frequency, hesitancy or incontinence. C/o chronic  joint pain, swelling and limitation in mobility. Denies headaches, seizures, numbness, or tingling. Denies depression, anxiety or insomnia. Denies skin break down or rash.      Observations/Objective: BP 138/84   Ht 5\' 2"  (1.575 m)   Wt 180 lb (81.6 kg)   BMI 32.92 kg/m  Good communication with no confusion and intact memory. Alert and  oriented x 3 No signs of respiratory distress during sppech    Assessment and Plan: Encounter for chronic pain management The patient's Controlled Substance registry is reviewed and compliance confirmed. Adequacy of  Pain control and level of function is assessed. Medication dosing is adjusted as deemed appropriate. Twelve weeks of medication is prescribed , patient signs for the script and is provided with a follow up appointment between 11 to 12 weeks .   Essential hypertension Controlled, no change in medication DASH diet and commitment to daily physical activity for a minimum of 30 minutes discussed and encouraged, as a part of hypertension management. The importance of attaining a healthy weight is also discussed.  BP/Weight 12/24/2018 10/02/2018 07/11/2018 04/10/2018 01/07/2018 01/07/2018 9/38/1017  Systolic BP 510 258 527 782 423 536 144  Diastolic BP 84 84 68 78 70 70 80  Wt. (Lbs) 180 179 174.04 177.12 183.08 183 185  BMI 32.92 32.74 31.83 32.4 33.49 33.47 33.84       Hyperlipidemia LDL goal <100 Hyperlipidemia:Low fat diet discussed and encouraged.   Lipid Panel  Lab Results  Component Value Date   CHOL 116 06/18/2018   HDL 45 06/18/2018   LDLCALC 45 06/18/2018   TRIG 128 06/18/2018   CHOLHDL 2.7 01/01/2018   Updated lab needed at/ before next visit.     Obesity (BMI 30.0-34.9) unchanged Patient re-educated about  the importance of commitment to a  minimum of 150 minutes of exercise per week as able.  The importance of healthy food choices with portion control discussed, as well as eating regularly and within a 12 hour window most days. The need to choose "clean , green" food 50 to 75% of  the time is discussed, as well as to make water the primary drink and set a goal of 64 ounces water daily.     Weight /BMI 12/24/2018 10/02/2018 07/11/2018  WEIGHT 180 lb 179 lb 174 lb 0.6 oz  HEIGHT 5\' 2"  5\' 2"  5\' 2"   BMI 32.92 kg/m2 32.74 kg/m2 31.83 kg/m2       Hypothyroidism Controlled, no change in medication Updated lab needed at/ before next visit.   Educated About Covid-19 Virus Infection Covid-19 Education  The signs and symptoms of of COVID -19 were discussed with the patient and how to seek care for testing. ( follow up with PCP or arrange  E-visit) The importance of social  distancing is discussed today.     Follow Up Instructions:    I discussed the assessment and treatment plan with the patient. The patient was provided an opportunity to ask questions and all were answered. The patient agreed with the plan and demonstrated an understanding of the instructions.   The patient was advised to call back or seek an in-person evaluation if the symptoms worsen or if the condition fails to improve as anticipated.  I provided 25 minutes of non-face-to-face time during this encounter.   Tula Nakayama, MD

## 2018-12-25 ENCOUNTER — Ambulatory Visit: Payer: Medicare Other | Admitting: Family Medicine

## 2018-12-29 ENCOUNTER — Encounter: Payer: Self-pay | Admitting: Family Medicine

## 2018-12-29 DIAGNOSIS — Z7189 Other specified counseling: Secondary | ICD-10-CM | POA: Insufficient documentation

## 2018-12-29 NOTE — Assessment & Plan Note (Signed)
Controlled, no change in medication Updated lab needed at/ before next visit.  

## 2018-12-29 NOTE — Assessment & Plan Note (Signed)
The patient's Controlled Substance registry is reviewed and compliance confirmed. Adequacy of  Pain control and level of function is assessed. Medication dosing is adjusted as deemed appropriate. Twelve weeks of medication is prescribed , patient signs for the script and is provided with a follow up appointment between 11 to 12 weeks .  

## 2018-12-29 NOTE — Assessment & Plan Note (Signed)
unchanged Patient re-educated about  the importance of commitment to a  minimum of 150 minutes of exercise per week as able.  The importance of healthy food choices with portion control discussed, as well as eating regularly and within a 12 hour window most days. The need to choose "clean , green" food 50 to 75% of the time is discussed, as well as to make water the primary drink and set a goal of 64 ounces water daily.     Weight /BMI 12/24/2018 10/02/2018 07/11/2018  WEIGHT 180 lb 179 lb 174 lb 0.6 oz  HEIGHT 5\' 2"  5\' 2"  5\' 2"   BMI 32.92 kg/m2 32.74 kg/m2 31.83 kg/m2

## 2018-12-29 NOTE — Assessment & Plan Note (Signed)
Hyperlipidemia:Low fat diet discussed and encouraged.   Lipid Panel  Lab Results  Component Value Date   CHOL 116 06/18/2018   HDL 45 06/18/2018   LDLCALC 45 06/18/2018   TRIG 128 06/18/2018   CHOLHDL 2.7 01/01/2018   Updated lab needed at/ before next visit.

## 2018-12-29 NOTE — Assessment & Plan Note (Signed)
Covid-19 Education  The signs and symptoms of of COVID -19 were discussed with the patient and how to seek care for testing. ( follow up with PCP or arrange  E-visit) The importance of social  distancing is discussed today.  

## 2018-12-29 NOTE — Assessment & Plan Note (Signed)
Controlled, no change in medication DASH diet and commitment to daily physical activity for a minimum of 30 minutes discussed and encouraged, as a part of hypertension management. The importance of attaining a healthy weight is also discussed.  BP/Weight 12/24/2018 10/02/2018 07/11/2018 04/10/2018 01/07/2018 01/07/2018 11/07/7541  Systolic BP 606 770 340 352 481 859 093  Diastolic BP 84 84 68 78 70 70 80  Wt. (Lbs) 180 179 174.04 177.12 183.08 183 185  BMI 32.92 32.74 31.83 32.4 33.49 33.47 33.84

## 2019-01-09 ENCOUNTER — Ambulatory Visit: Payer: Medicare Other | Admitting: Family Medicine

## 2019-01-15 ENCOUNTER — Ambulatory Visit (INDEPENDENT_AMBULATORY_CARE_PROVIDER_SITE_OTHER): Payer: Medicare Other | Admitting: Family Medicine

## 2019-01-15 ENCOUNTER — Other Ambulatory Visit: Payer: Self-pay

## 2019-01-15 ENCOUNTER — Encounter: Payer: Self-pay | Admitting: Family Medicine

## 2019-01-15 VITALS — BP 138/84 | HR 80 | Resp 12 | Ht 62.0 in | Wt 180.0 lb

## 2019-01-15 DIAGNOSIS — Z0000 Encounter for general adult medical examination without abnormal findings: Secondary | ICD-10-CM | POA: Diagnosis not present

## 2019-01-15 NOTE — Progress Notes (Signed)
Subjective:   Katelyn Rowland is a 78 y.o. female who presents for Medicare Annual (Subsequent) preventive examination.  Location of Patient: Home Location of Provider: Telehealth Consent was obtain for visit to be over via telehealth. I verified that I am speaking with the correct person using two identifiers.   Review of Systems:    Cardiac Risk Factors include: advanced age (>59men, >25 women);diabetes mellitus;dyslipidemia;hypertension     Objective:     Vitals: BP 138/84   Pulse 80   Resp 12   Ht 5\' 2"  (1.575 m)   Wt 180 lb (81.6 kg)   BMI 32.92 kg/m   Body mass index is 32.92 kg/m.  Advanced Directives 01/07/2018 03/29/2016 02/04/2015 11/18/2014 10/16/2014 06/13/2012 06/04/2012  Does Patient Have a Medical Advance Directive? Yes No No Yes No Patient has advance directive, copy not in chart Patient has advance directive, copy not in chart  Type of Advance Directive Out of facility DNR (pink MOST or yellow form);Living will;Healthcare Power of [place] - - -  Does patient want to make changes to medical advance directive? - - No - Patient declined - - - -  Copy of [place] in Chart? Yes - No - copy requested No - copy requested - - -  Would patient like information on creating a medical advance directive? - No - patient declined information - - No - patient declined information - -  Pre-existing out of facility DNR order (yellow form or pink MOST form) Yellow form placed in chart (order not valid for inpatient use) - - - - - -    Tobacco Social History   Tobacco Use  Smoking Status Former Smoker  . Packs/day: 1.00  . Years: 42.00  . Pack years: 42.00  . Types: Cigarettes  . Last attempt to quit: 06/04/2010  . Years since quitting: 8.6  Smokeless Tobacco Never Used     Counseling given: Yes   Clinical Intake:  Pre-visit preparation completed: Yes  Pain : No/denies pain Pain Score:  0-No pain     BMI - recorded: 32.92 Nutritional Status: BMI > 30  Obese Nutritional Risks: None Diabetes: Yes CBG done?: No Did pt. 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 Anemia   . Anxiety   . Arthritis   . Chronic kidney disease   . Depression   . Diabetes mellitus 2009   no meds  . 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 Edema of both legs   . GERD (gastroesophageal reflux disease)   . Headache(784.0)   . Hematoma of leg    left   . High blood pressure   . Hypertension    margaret simpson  [id number]  . Muscle cramps   . Neck pain   . PONV (postoperative nausea and vomiting)   . Reflux   . Thyroid disorder    Past Surgical History:  Procedure Laterality Date  . ABDOMINAL HYSTERECTOMY    . APPENDECTOMY    . BIOPSY N/A 11/20/2014   Procedure: BIOPSY;  Surgeon: Rogene Houston, MD;  Location: [facility];  Service: Endoscopy;  Laterality: N/A;  . CERVICAL SPINE SURGERY    . ESOPHAGEAL DILATION N/A 11/20/2014   Procedure: ESOPHAGEAL DILATION [place];  Surgeon: Rogene Houston, MD;  Location: [facility];  Service: Endoscopy;  Laterality: N/A;  . ESOPHAGOGASTRODUODENOSCOPY (EGD) WITH PROPOFOL N/A 11/20/2014   Procedure: ESOPHAGOGASTRODUODENOSCOPY (EGD) WITH PROPOFOL;  Surgeon: Rogene Houston, MD;  Location: [facility];  Service: Endoscopy;  Laterality: N/A;  . FOOT NEUROMA SURGERY     right   . ivc filter placement, following recurrent left DVT  07/2009  . NASAL SINUS SURGERY    . THORACIC DISCECTOMY  06/12/2012   Procedure: THORACIC DISCECTOMY;  Surgeon: Floyce Stakes, MD;  Location: [facility];  Service:  Neurosurgery;  Laterality: N/A;  Thoracic Ten-Eleven Thoracic Laminectomy  . TONSILLECTOMY    . TONSILLECTOMY AND ADENOIDECTOMY    . unilateal oopherectomy    . VESICOVAGINAL FISTULA CLOSURE W/ TAH     Family History  Problem Relation Age of Onset  . Emphysema Sister   . Kidney disease Sister   . Diabetes Mother   . Diabetes Father   . Diabetes Brother   . Arthritis Other   . Kidney disease Other    Social History   Socioeconomic History  . Marital status: Widowed    Spouse name: Not on file  . Number of children: 4  . Years of education: 35  . Highest education level: Not on file  Occupational History  . Occupation: disabled     Employer: RETIRED  Social Needs  . Financial resource strain: Not hard at all  . Food insecurity:    Worry: Never true    Inability: Never true  . Transportation needs:    Medical: No    Non-medical: No  Tobacco Use  . Smoking status: Former Smoker    Packs/day: 1.00    Years: 42.00    Pack years: 42.00    Types: Cigarettes    Last attempt to quit: 06/04/2010    Years since quitting: 8.6  . Smokeless tobacco: Never Used  Substance and Sexual Activity  . Alcohol use: No  . Drug use: No  . Sexual activity: Never    Birth control/protection: Surgical  Lifestyle  . Physical activity:    Days per week: 7 days    Minutes per session: 60 min  . Stress: Not at all  Relationships  . Social connections:    Talks on phone: Twice a week    Gets together: Twice a week    Attends religious service: More than 4 times per year    Active member of club or organization: No    Attends meetings of clubs or organizations: Never    Relationship status: Widowed  Other Topics Concern  . Not on file  Social History Narrative  . Not on file    Outpatient Encounter Medications as of 01/15/2019  Medication Sig  . amLODipine (NORVASC) 2.5 MG tablet TAKE 1 TABLET BY MOUTH DAILY.  . calcitRIOL (ROCALTROL) 0.25 MCG capsule Take 0.25 mcg by mouth 3  (three) times a week.  . diphenhydrAMINE (BENADRYL) 25 mg capsule Take 25 mg by mouth every 8 (eight) hours as needed for allergies.  Marland Kitchen esomeprazole (NEXIUM) 40 MG capsule TAKE ONE CAPSULE BY MOUTH DAILY BEFORE BREAKFAST.  Marland Kitchen levothyroxine (SYNTHROID, LEVOTHROID) 100 MCG tablet TAKE 1 TABLET BY MOUTH ONCE A DAY.  Marland Kitchen lisinopril (PRINIVIL,ZESTRIL) 2.5 MG  tablet Take 2.5 mg by mouth daily.  . methadone (DOLOPHINE) 10 MG tablet Take one tablet three times daily by mouth for chronic pain  . [START ON 02/10/2019] methadone (DOLOPHINE) 10 MG tablet Take one tablet three times daily by mouth for chronic pain  . [START ON 03/12/2019] methadone (DOLOPHINE) 10 MG tablet Take one tablet by mouth three times daily for chronic pain  . polyethylene glycol powder (GLYCOLAX/MIRALAX) powder MIX 1 CAPFUL (17 GRAMS) WITH 8OZ OF WATER OR JUICE DAILY.  Marland Kitchen predniSONE (DELTASONE) 10 MG tablet One tabklet once daily , as needed, for uncontrolled back pain  . predniSONE (DELTASONE) 10 MG tablet Take one tablet once daily, as need, for  Chronic  Arthritic pain  . predniSONE (DELTASONE) 10 MG tablet Take one tablet by mouth , once daily as needed, for uncontrolled arthritic  pain  . rosuvastatin (CRESTOR) 20 MG tablet Take 1 tablet (20 mg total) by mouth daily.  Marland Kitchen tiZANidine (ZANAFLEX) 4 MG capsule Take one capsule once daily for muscle spasm  . traZODone (DESYREL) 100 MG tablet TAKE 1 TABLET BY MOUTH AT BEDTIME.   No facility-administered encounter medications on file as of 01/15/2019.     Activities of Daily Living In your present state of health, do you have any difficulty performing the following activities: 01/15/2019  Hearing? N  Vision? N  Difficulty concentrating or making decisions? N  Walking or climbing stairs? Y  Dressing or bathing? N  Doing errands, shopping? Y  Preparing Food and eating ? N  Using the Toilet? N  In the past six months, have you accidently leaked urine? N  Do you have problems with loss of  bowel control? N  Managing your Medications? N  Managing your Finances? N  Housekeeping or managing your Housekeeping? N  Some recent data might be hidden    Patient Care Team: Fayrene Helper, MD as PCP - General    Assessment:   This is a routine wellness examination for Katelyn Rowland.  Exercise Activities and Dietary recommendations Current Exercise Habits: The patient does not participate in regular exercise at present, Exercise limited by: orthopedic condition(s)  Goals   None     Fall Risk Fall Risk  01/15/2019 12/24/2018 10/02/2018 07/11/2018 01/07/2018  Falls in the past year? 0 0 0 0 No  Number falls in past yr: - - 0 0 -  Injury with Fall? 0 0 0 0 -  Risk Factor Category  - - - - -  Risk for fall due to : - - Impaired mobility;Medication side effect - -  Follow up - - Falls evaluation completed - -   Is the patient's home free of loose throw rugs in walkways, pet beds, electrical cords, etc?   yes      Grab bars in the bathroom? yes      Handrails on the stairs?   yes      Adequate lighting?   yes    Depression Screen PHQ 2/9 Scores 01/15/2019 12/24/2018 10/02/2018 07/11/2018  PHQ - 2 Score 0 0 0 0     Cognitive Function MMSE - Mini Mental State Exam 12/10/2015  Orientation to time 5  Orientation to Place 5  Registration 3  Attention/ Calculation 5  Recall 3  Language- name 2 objects 2  Language- repeat 1  Language- follow 3 step command 3  Language- read & follow direction 1  Write a sentence 1  Copy design 0  Total score 29  6CIT Screen 01/15/2019 01/07/2018  What Year? 0 points 0 points  What month? 0 points 0 points  What time? 0 points 0 points  Count back from 20 0 points 0 points  Months in reverse 0 points 0 points  Repeat phrase 0 points 0 points  Total Score 0 0    Immunization History  Administered Date(s) Administered  . Pneumococcal Polysaccharide-23 03/26/2006  . Td 12/30/2003    Qualifies for Shingles Vaccine? Pt declines vaccine   Screening Tests Health Maintenance  Topic Date Due  . OPHTHALMOLOGY EXAM  01/21/1951  . TETANUS/TDAP  12/29/2013  . HEMOGLOBIN A1C  12/17/2018  . PNA vac Low Risk Adult (2 of 2 - PCV13) 04/17/2019 (Originally 03/27/2007)  . INFLUENZA VACCINE  03/08/2019  . FOOT EXAM  07/27/2019  . DEXA SCAN  Completed    Cancer Screenings: Lung: Low Dose CT Chest recommended if Age 35-80 years, 30 pack-year currently smoking OR have quit w/in 15years. Patient does qualify. Breast:  Up to date on Mammogram? Refused Up to date of Bone Density/Dexa? Refused up dated Colorectal:  No longer has these done  Additional Screenings:   Hepatitis C Screening: can be added to next labs     Plan:      1. Encounter for Medicare annual wellness exam   I have personally reviewed and noted the following in the patient's chart:   . Medical and social history . Use of alcohol, tobacco or illicit drugs  . Current medications and supplements . Functional ability and status . Nutritional status . Physical activity . Advanced directives . List of other physicians . Hospitalizations, surgeries, and ER visits in previous 12 months . Vitals . Screenings to include cognitive, depression, and falls . Referrals and appointments  In addition, I have reviewed and discussed with patient certain preventive protocols, quality metrics, and best practice recommendations. A written personalized care plan for preventive services as well as general preventive health recommendations were provided to patient.    I provided 20 minutes of non-face-to-face time during this encounter.    Perlie Mayo, NP  01/15/2019

## 2019-01-15 NOTE — Patient Instructions (Signed)
Katelyn Rowland , Thank you for taking time to come for your Medicare Wellness Visit. I appreciate your ongoing commitment to your health goals. Please review the following plan we discussed and let me know if I can assist you in the future.   Recommended yearly ophthalmology/optometry visit for glaucoma screening and checkup Recommended yearly dental visit for hygiene and checkup  Next appointment: 04/02/2019   Preventive Care 79 Years and Older, Female Preventive care refers to lifestyle choices and visits with your health care provider that can promote health and wellness. What does preventive care include?  A yearly physical exam. This is also called an annual well check.  Dental exams once or twice a year.  Routine eye exams. Ask your health care provider how often you should have your eyes checked.  Personal lifestyle choices, including:  Daily care of your teeth and gums.  Regular physical activity.  Eating a healthy diet.  Avoiding tobacco and drug use.  Limiting alcohol use.  Practicing safe sex.  Taking low-dose aspirin every day.  Taking vitamin and mineral supplements as recommended by your health care provider. What happens during an annual well check? The services and screenings done by your health care provider during your annual well check will depend on your age, overall health, lifestyle risk factors, and family history of disease. Counseling  Your health care provider may ask you questions about your:  Alcohol use.  Tobacco use.  Drug use.  Emotional well-being.  Home and relationship well-being.  Sexual activity.  Eating habits.  History of falls.  Memory and ability to understand (cognition).  Work and work Statistician.  Reproductive health. Screening  You may have the following tests or measurements:  Height, weight, and BMI.  Blood pressure.  Lipid and cholesterol levels. These may be checked every 5 years, or more frequently if  you are over 52 years old.  Skin check.  Lung cancer screening. You may have this screening every year starting at age 33 if you have a 30-pack-year history of smoking and currently smoke or have quit within the past 15 years.  Fecal occult blood test (FOBT) of the stool. You may have this test every year starting at age 55.  Flexible sigmoidoscopy or colonoscopy. You may have a sigmoidoscopy every 5 years or a colonoscopy every 10 years starting at age 53.  Hepatitis C blood test.  Hepatitis B blood test.  Sexually transmitted disease (STD) testing.  Diabetes screening. This is done by checking your blood sugar (glucose) after you have not eaten for a while (fasting). You may have this done every 1-3 years.  Bone density scan. This is done to screen for osteoporosis. You may have this done starting at age 84.  Mammogram. This may be done every 1-2 years. Talk to your health care provider about how often you should have regular mammograms. Talk with your health care provider about your test results, treatment options, and if necessary, the need for more tests. Vaccines  Your health care provider may recommend certain vaccines, such as:  Influenza vaccine. This is recommended every year.  Tetanus, diphtheria, and acellular pertussis (Tdap, Td) vaccine. You may need a Td booster every 10 years.  Zoster vaccine. You may need this after age 35.  Pneumococcal 13-valent conjugate (PCV13) vaccine. One dose is recommended after age 20.  Pneumococcal polysaccharide (PPSV23) vaccine. One dose is recommended after age 35. Talk to your health care provider about which screenings and vaccines you need and how  often you need them. This information is not intended to replace advice given to you by your health care provider. Make sure you discuss any questions you have with your health care provider. Document Released: 08/20/2015 Document Revised: 04/12/2016 Document Reviewed: 05/25/2015  Elsevier Interactive Patient Education  2017 [place] Prevention in the Home Falls can cause injuries. They can happen to people of all ages. There are many things you can do to make your home safe and to help prevent falls. What can I do on the outside of my home?  Regularly fix the edges of walkways and driveways and fix any cracks.  Remove anything that might make you trip as you walk through a door, such as a raised step or threshold.  Trim any bushes or trees on the path to your home.  Use bright outdoor lighting.  Clear any walking paths of anything that might make someone trip, such as rocks or tools.  Regularly check to see if handrails are loose or broken. Make sure that both sides of any steps have handrails.  Any raised decks and porches should have guardrails on the edges.  Have any leaves, snow, or ice cleared regularly.  Use sand or salt on walking paths during winter.  Clean up any spills in your garage right away. This includes oil or grease spills. What can I do in the bathroom?  Use night lights.  Install grab bars by the toilet and in the tub and shower. Do not use towel bars as grab bars.  Use non-skid mats or decals in the tub or shower.  If you need to sit down in the shower, use a plastic, non-slip stool.  Keep the floor dry. Clean up any water that spills on the floor as soon as it happens.  Remove soap buildup in the tub or shower regularly.  Attach bath mats securely with double-sided non-slip rug tape.  Do not have throw rugs and other things on the floor that can make you trip. What can I do in the bedroom?  Use night lights.  Make sure that you have a light by your bed that is easy to reach.  Do not use any sheets or blankets that are too big for your bed. They should not hang down onto the floor.  Have a firm chair that has side arms. You can use this for support while you get dressed.  Do not have throw rugs and other  things on the floor that can make you trip. What can I do in the kitchen?  Clean up any spills right away.  Avoid walking on wet floors.  Keep items that you use a lot in easy-to-reach places.  If you need to reach something above you, use a strong step stool that has a grab bar.  Keep electrical cords out of the way.  Do not use floor polish or wax that makes floors slippery. If you must use wax, use non-skid floor wax.  Do not have throw rugs and other things on the floor that can make you trip. What can I do with my stairs?  Do not leave any items on the stairs.  Make sure that there are handrails on both sides of the stairs and use them. Fix handrails that are broken or loose. Make sure that handrails are as long as the stairways.  Check any carpeting to make sure that it is firmly attached to the stairs. Fix any carpet that is loose or  worn.  Avoid having throw rugs at the top or bottom of the stairs. If you do have throw rugs, attach them to the floor with carpet tape.  Make sure that you have a light switch at the top of the stairs and the bottom of the stairs. If you do not have them, ask someone to add them for you. What else can I do to help prevent falls?  Wear shoes that:  Do not have high heels.  Have rubber bottoms.  Are comfortable and fit you well.  Are closed at the toe. Do not wear sandals.  If you use a stepladder:  Make sure that it is fully opened. Do not climb a closed stepladder.  Make sure that both sides of the stepladder are locked into place.  Ask someone to hold it for you, if possible.  Clearly mark and make sure that you can see:  Any grab bars or handrails.  First and last steps.  Where the edge of each step is.  Use tools that help you move around (mobility aids) if they are needed. These include:  Canes.  Walkers.  Scooters.  Crutches.  Turn on the lights when you go into a dark area. Replace any light bulbs as soon as  they burn out.  Set up your furniture so you have a clear path. Avoid moving your furniture around.  If any of your floors are uneven, fix them.  If there are any pets around you, be aware of where they are.  Review your medicines with your doctor. Some medicines can make you feel dizzy. This can increase your chance of falling. Ask your doctor what other things that you can do to help prevent falls. This information is not intended to replace advice given to you by your health care provider. Make sure you discuss any questions you have with your health care provider. Document Released: 05/20/2009 Document Revised: 12/30/2015 Document Reviewed: 08/28/2014 Elsevier Interactive Patient Education  2017 [employer].

## 2019-02-21 ENCOUNTER — Other Ambulatory Visit: Payer: Self-pay | Admitting: Family Medicine

## 2019-02-25 ENCOUNTER — Telehealth: Payer: Self-pay | Admitting: Family Medicine

## 2019-02-25 NOTE — Telephone Encounter (Signed)
Pt would like to know if she could take a muscle relaxer\spasms pill in the daytime , and one at night --please call the pt back

## 2019-02-26 ENCOUNTER — Other Ambulatory Visit: Payer: Self-pay | Admitting: Family Medicine

## 2019-02-26 MED ORDER — TIZANIDINE HCL 4 MG PO TABS: ORAL_TABLET | ORAL | 2 refills | Status: DC

## 2019-02-26 NOTE — Telephone Encounter (Signed)
Wil double th dose, and her f/u appt needs to be with me in August, I sent a message to front staff to correct

## 2019-02-26 NOTE — Telephone Encounter (Signed)
Please advise 

## 2019-02-26 NOTE — Progress Notes (Signed)
zanaflex

## 2019-02-26 NOTE — Telephone Encounter (Signed)
Pt aware.

## 2019-03-02 ENCOUNTER — Encounter (HOSPITAL_COMMUNITY): Payer: Self-pay | Admitting: Emergency Medicine

## 2019-03-02 ENCOUNTER — Other Ambulatory Visit: Payer: Self-pay

## 2019-03-02 ENCOUNTER — Emergency Department (HOSPITAL_COMMUNITY)
Admission: EM | Admit: 2019-03-02 | Discharge: 2019-03-02 | Discharge status: Against Medical Advice | Payer: Medicare Other | Attending: Emergency Medicine | Admitting: Emergency Medicine

## 2019-03-02 DIAGNOSIS — Z5321 Procedure and treatment not carried out due to patient leaving prior to being seen by health care provider: Secondary | ICD-10-CM | POA: Insufficient documentation

## 2019-03-02 DIAGNOSIS — I10 Essential (primary) hypertension: Secondary | ICD-10-CM | POA: Diagnosis present

## 2019-03-02 NOTE — ED Triage Notes (Signed)
Pt states that her bp has been elevated since yesterday. She is jugglery and nervous she denies any pain or missed doses of medications.

## 2019-03-03 ENCOUNTER — Other Ambulatory Visit: Payer: Self-pay

## 2019-03-03 ENCOUNTER — Telehealth: Payer: Self-pay | Admitting: Family Medicine

## 2019-03-03 DIAGNOSIS — Z209 Contact with and (suspected) exposure to unspecified communicable disease: Secondary | ICD-10-CM | POA: Diagnosis not present

## 2019-03-03 DIAGNOSIS — I10 Essential (primary) hypertension: Secondary | ICD-10-CM | POA: Diagnosis not present

## 2019-03-03 MED ORDER — TRAZODONE HCL 100 MG PO TABS: 100.0000 mg | ORAL_TABLET | Freq: Every day | ORAL | 5 refills | Status: DC

## 2019-03-03 NOTE — Telephone Encounter (Signed)
Call placed by daughter,on 03/02/2019 pm who advised that for past 4 days, Yamili has c/o generalized weakness and feeling jittery, reported BP of 10 systolic, and normal blood sugar ED eval recommended. I spoke to Zayra also, who unwillingly agreed to eval, will need f/u in office this week  Please contact pt , needs CBC, cmp and EGF, TSh , hBA1C and vitD and lipid,in next 1 to 2 days, asting but needs to drink water

## 2019-03-03 NOTE — Telephone Encounter (Signed)
Patient aware and scheduled for Thursday and aware to go back to the ER if she feels worse

## 2019-03-04 ENCOUNTER — Telehealth: Payer: Self-pay | Admitting: Family Medicine

## 2019-03-04 DIAGNOSIS — E785 Hyperlipidemia, unspecified: Secondary | ICD-10-CM

## 2019-03-04 DIAGNOSIS — E038 Other specified hypothyroidism: Secondary | ICD-10-CM

## 2019-03-04 DIAGNOSIS — R52 Pain, unspecified: Secondary | ICD-10-CM

## 2019-03-04 DIAGNOSIS — E559 Vitamin D deficiency, unspecified: Secondary | ICD-10-CM

## 2019-03-04 DIAGNOSIS — I10 Essential (primary) hypertension: Secondary | ICD-10-CM

## 2019-03-04 NOTE — Telephone Encounter (Signed)
Pt needs labs orders sent to [place]

## 2019-03-04 NOTE — Telephone Encounter (Signed)
Lab orders changed to [place]

## 2019-03-06 ENCOUNTER — Other Ambulatory Visit: Payer: Self-pay

## 2019-03-06 ENCOUNTER — Ambulatory Visit (INDEPENDENT_AMBULATORY_CARE_PROVIDER_SITE_OTHER): Payer: Medicare Other | Admitting: Family Medicine

## 2019-03-06 ENCOUNTER — Encounter: Payer: Self-pay | Admitting: Family Medicine

## 2019-03-06 VITALS — BP 158/80 | Ht 62.0 in | Wt 178.0 lb

## 2019-03-06 DIAGNOSIS — N184 Chronic kidney disease, stage 4 (severe): Secondary | ICD-10-CM | POA: Diagnosis not present

## 2019-03-06 DIAGNOSIS — I10 Essential (primary) hypertension: Secondary | ICD-10-CM

## 2019-03-06 MED ORDER — CALCITRIOL 0.25 MCG PO CAPS: 0.2500 ug | ORAL_CAPSULE | ORAL | 1 refills | Status: DC

## 2019-03-06 MED ORDER — LISINOPRIL 2.5 MG PO TABS: 2.5000 mg | ORAL_TABLET | Freq: Every day | ORAL | 1 refills | Status: DC

## 2019-03-06 NOTE — Progress Notes (Signed)
Virtual Visit via Telephone Note   This visit type was conducted due to national recommendations for restrictions regarding the COVID-19 Pandemic (e.g. social distancing) in an effort to limit this patient's exposure and mitigate transmission in our community.  Due to her co-morbid illnesses, this patient is at least at moderate risk for complications without adequate follow up.  This format is felt to be most appropriate for this patient at this time.  The patient did not have access to video technology/had technical difficulties with video requiring transitioning to audio format only (telephone).  All issues noted in this document were discussed and addressed.  No physical exam could be performed with this format.   Evaluation Performed:  Follow-up visit  Date:  03/06/2019   ID:  Katelyn, Rowland 09/13/1940, MRN [id number]  Patient Location: Home Provider Location: Office  Location of Patient: Home Location of Provider: Telehealth Consent was obtain for visit to be over via telehealth. I verified that I am speaking with the correct person using two identifiers.  PCP:  Fayrene Helper, MD   Chief Complaint:  bp   History of Present Illness:    Katelyn Rowland is a 78 y.o. female with hx of hypertension, PVD, diabetes type 2, hypothyroid CKD stage IV, anemia, obesity among others.  Daughter had called up to the office secondary to mother's blood pressure being elevated on an automated cuff reading 214/105.  Ms. Sokol reports that she woke up feeling jittery and just not feeling well overall.  Reports that she had not slept well in 2 days was having pain.  She thinks that that was what was causing her to feel worse than normal.  As she reports sometimes feeling jittery in the mornings.  Reports her blood sugar was good.  They called EMS EMS got there her blood pressure was down to 150s over the 80s.  Oxygen was stable heart rate was good and blood sugar was good.  She was not  transported to the emergency room.  Previous office visits demonstrated control of blood pressure giving circumstances of history and medications.  Reports that her kidney doctor has not put in her refills for lisinopril and Calcitrol.  She reports she is caught up there but she has been out of it since 17 July.  Would like these refilled by [place] if possible.  Today she reports that she is feeling much better and does not have any other issues to discuss or talk about.  Dr. Moshe Cipro had previously put in labs for her to have drawn in the next 1 to 2 days.  Reports that she is unable to get to the lab until she gets TO take her next week.  The patient does not have symptoms concerning for COVID-19 infection (fever, chills, cough, or new shortness of breath).   Past Medical, Surgical, Social History, Allergies, and Medications have been Reviewed.   Past Medical History:  Diagnosis Date  . Anemia   . Anxiety   . Arthritis   . Chronic kidney disease   . Depression   . Diabetes mellitus 2009   no meds  . 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 Edema of both legs   . GERD (gastroesophageal reflux disease)   . Headache(784.0)   . Hematoma of leg    left   . High blood pressure   . Hypertension    margaret simpson  [id number]  . Muscle cramps   . Neck pain   . PONV (postoperative nausea and vomiting)   . Reflux   . Thyroid disorder    Past Surgical History:  Procedure Laterality Date  . ABDOMINAL HYSTERECTOMY    . APPENDECTOMY    . BIOPSY N/A 11/20/2014   Procedure: BIOPSY;  Surgeon: Rogene Houston, MD;  Location: [facility];  Service: Endoscopy;  Laterality: N/A;  . CERVICAL SPINE SURGERY    . ESOPHAGEAL DILATION N/A 11/20/2014   Procedure: ESOPHAGEAL DILATION [place];  Surgeon: Rogene Houston, MD;  Location: [facility];  Service: Endoscopy;  Laterality: N/A;  . ESOPHAGOGASTRODUODENOSCOPY (EGD) WITH PROPOFOL N/A 11/20/2014    Procedure: ESOPHAGOGASTRODUODENOSCOPY (EGD) WITH PROPOFOL;  Surgeon: Rogene Houston, MD;  Location: [facility];  Service: Endoscopy;  Laterality: N/A;  . FOOT NEUROMA SURGERY     right   . ivc filter placement, following recurrent left DVT  07/2009  . NASAL SINUS SURGERY    . THORACIC DISCECTOMY  06/12/2012   Procedure: THORACIC DISCECTOMY;  Surgeon: Floyce Stakes, MD;  Location: [facility];  Service: Neurosurgery;  Laterality: N/A;  Thoracic Ten-Eleven Thoracic Laminectomy  . TONSILLECTOMY    . TONSILLECTOMY AND ADENOIDECTOMY    . unilateal oopherectomy    . VESICOVAGINAL FISTULA CLOSURE W/ TAH       Current Meds  Medication Sig  . amLODipine (NORVASC) 2.5 MG tablet TAKE 1 TABLET BY MOUTH DAILY.  Derrill Memo ON 03/07/2019] calcitRIOL (ROCALTROL) 0.25 MCG capsule Take 1 capsule (0.25 mcg total) by mouth 3 (three) times a week.  . diphenhydrAMINE (BENADRYL) 25 mg capsule Take 25 mg by mouth every 8 (eight) hours as needed for allergies.  Marland Kitchen esomeprazole (NEXIUM) 40 MG capsule TAKE ONE CAPSULE BY MOUTH DAILY BEFORE BREAKFAST.  Marland Kitchen levothyroxine (SYNTHROID, LEVOTHROID) 100 MCG tablet TAKE 1 TABLET BY MOUTH ONCE A DAY.  Marland Kitchen lisinopril (ZESTRIL) 2.5 MG tablet Take 1 tablet (2.5 mg total) by mouth daily.  . methadone (DOLOPHINE) 10 MG tablet Take one tablet three times daily by mouth for chronic pain  . [START ON 03/12/2019] methadone (DOLOPHINE) 10 MG tablet Take one tablet by mouth three times daily for chronic pain  . polyethylene glycol powder (GLYCOLAX/MIRALAX) powder MIX 1 CAPFUL (17 GRAMS) WITH 8OZ OF WATER OR JUICE DAILY.  Marland Kitchen predniSONE (DELTASONE) 10 MG tablet One tabklet once daily , as needed, for uncontrolled back pain  . predniSONE (DELTASONE) 10 MG tablet Take one tablet once daily, as need, for  Chronic  Arthritic pain  . predniSONE (DELTASONE) 10 MG tablet Take one tablet by mouth , once daily as needed, for uncontrolled arthritic  pain  . rosuvastatin (CRESTOR) 20 MG tablet Take 1 tablet  (20 mg total) by mouth daily.  Marland Kitchen tiZANidine (ZANAFLEX) 4 MG tablet Take one capsule two times daily for muscle spasm  . traZODone (DESYREL) 100 MG tablet TAKE 1 TABLET BY MOUTH AT BEDTIME.  . traZODone (DESYREL) 100 MG tablet Take 1 tablet (100 mg total) by mouth at bedtime.  . [DISCONTINUED] calcitRIOL (ROCALTROL) 0.25 MCG capsule Take 0.25 mcg by mouth 3 (three) times a week.  . [DISCONTINUED] calcitRIOL (ROCALTROL) 0.25 MCG capsule Take 0.25 mcg by mouth every Monday, Wednesday, and Friday.  . [DISCONTINUED] lisinopril (PRINIVIL,ZESTRIL) 2.5 MG tablet Take 2.5 mg by mouth daily.  . [DISCONTINUED] lisinopril (ZESTRIL) 2.5 MG tablet Take 2.5 mg by mouth daily.     Allergies:   Prednisone, Acetaminophen, Other, Sulfur,  Warfarin sodium, and Cymbalta [duloxetine hcl]   Social History   Tobacco Use  . Smoking status: Former Smoker    Packs/day: 1.00    Years: 42.00    Pack years: 42.00    Types: Cigarettes    Quit date: 06/04/2010    Years since quitting: 8.7  . Smokeless tobacco: Never Used  Substance Use Topics  . Alcohol use: No  . Drug use: No     Family Hx: The patient's family history includes Arthritis in an other family member; Diabetes in her brother, father, and mother; Emphysema in her sister; Kidney disease in her sister and another family member.  ROS:   Please see the history of present illness.     All other systems reviewed and are negative.   Labs/Other Tests and Data Reviewed:     Recent Labs: 06/18/2018: ALT 15; BUN 20; Creatinine, Ser 1.78; Hemoglobin 13.0; Platelets 176; Potassium 5.0; Sodium 141 09/30/2018: TSH 2.320   Recent Lipid Panel Lab Results  Component Value Date/Time   CHOL 116 06/18/2018 02:06 PM   TRIG 128 06/18/2018 02:06 PM   HDL 45 06/18/2018 02:06 PM   CHOLHDL 2.7 01/01/2018 02:02 PM   CHOLHDL 3.1 06/05/2014 12:33 PM   LDLCALC 45 06/18/2018 02:06 PM    Wt Readings from Last 3 Encounters:  03/06/19 178 lb (80.7 kg)  01/15/19 180  lb (81.6 kg)  12/24/18 180 lb (81.6 kg)     Objective:    Vital Signs:  BP (!) 158/80   Ht 5\' 2"  (1.575 m)   Wt 178 lb (80.7 kg)   BMI 32.56 kg/m    VITAL SIGNS:  reviewed GEN:  Alert and oriented RESPIRATORY:  No shortness of breath noted during conversation PSYCH:  Normal mood and good communication, mild anxiety  ASSESSMENT & PLAN:     1. Essential hypertension Krisna P Schommer is encouraged to maintain a well balanced diet that is low in salt. Controlled, continue current medication regimen. Refills provided also reminded that exercise is beneficial for heart health and control of  Blood pressure. 30-60 minutes daily is recommended-walking was suggested. Labs previously ordered by Dr Moshe Cipro.    lisinopril (ZESTRIL) 2.5 MG tablet; Take 1 tablet (2.5 mg total) by mouth daily.  Dispense: 30 tablet; Refill: 1  2. CKD (chronic kidney disease) stage 4, GFR 15-29 ml/min (HCC) Needed refill, reported Kidney MD had not refilled yet even with her calling about it  - calcitRIOL (ROCALTROL) 0.25 MCG capsule; Take 1 capsule (0.25 mcg total) by mouth 3 (three) times a week.  Dispense: 30 capsule; Refill: 1  Either either time:   Today, I have spent 10 minutes with the patient with telehealth technology discussing the above problems.    Medication Adjustments/Labs and Tests Ordered: Current medicines are reviewed at length with the patient today.  Concerns regarding medicines are outlined above.   Tests Ordered: No orders of the defined types were placed in this encounter.   Medication Changes: Meds ordered this encounter  Medications  . lisinopril (ZESTRIL) 2.5 MG tablet    Sig: Take 1 tablet (2.5 mg total) by mouth daily.    Dispense:  30 tablet    Refill:  1    Order Specific Question:   Supervising Provider    Answer:   SIMPSON, MARGARET E [[id number]]  . calcitRIOL (ROCALTROL) 0.25 MCG capsule    Sig: Take 1 capsule (0.25 mcg total) by mouth 3 (three) times a week.  Dispense:  30 capsule    Refill:  1    Order Specific Question:   Supervising Provider    Answer:   Fayrene Helper [[id number]]    Disposition:  Follow up prn  Signed, Perlie Mayo, NP  03/06/2019 3:28 PM     [facility]

## 2019-03-06 NOTE — Patient Instructions (Signed)
    Thank you for coming into the office today. I appreciate the opportunity to provide you with the care for your health and wellness. Today we discussed: bp  FOLLOW UP: as needed already has an appointment in August with Dr. Moshe Cipro.  Dr. Moshe Cipro placed labs.  Advised to get these in the next week.  Continue taking medications as directed.  Lisinopril and Calcitrol were sent into the pharmacy today.  Please continue to practice social distancing to keep you, your family, and our community safe.  If you must go out, please wear a Mask and practice good handwashing.  [place] YOUR HANDS WELL AND FREQUENTLY. AVOID TOUCHING YOUR FACE, UNLESS YOUR HANDS ARE FRESHLY WASHED.  GET FRESH AIR DAILY. STAY HYDRATED WITH WATER.   It was a pleasure to see you and I look forward to continuing to work together on your health and well-being. Please do not hesitate to call the office if you need care or have questions about your care.  Have a wonderful day and week.  With Gratitude,  Cherly Beach, DNP, AGNP-BC

## 2019-03-13 ENCOUNTER — Telehealth: Payer: Self-pay | Admitting: *Deleted

## 2019-03-13 NOTE — Telephone Encounter (Signed)
noted 

## 2019-03-13 NOTE — Telephone Encounter (Signed)
Pt called wanting to let Dr. Moshe Cipro know that she was going to go get her blood work done today but there was issues with the [facility] and they were an hour late and the lab wouldve been closed for lunch by the time she came. Just wanted to let her know that they have set it up to take her 03-19-19 to get labs drawn

## 2019-03-19 DIAGNOSIS — E785 Hyperlipidemia, unspecified: Secondary | ICD-10-CM | POA: Diagnosis not present

## 2019-03-19 DIAGNOSIS — I10 Essential (primary) hypertension: Secondary | ICD-10-CM | POA: Diagnosis not present

## 2019-03-19 DIAGNOSIS — E559 Vitamin D deficiency, unspecified: Secondary | ICD-10-CM | POA: Diagnosis not present

## 2019-03-19 DIAGNOSIS — E038 Other specified hypothyroidism: Secondary | ICD-10-CM | POA: Diagnosis not present

## 2019-03-20 LAB — LIPID PANEL
Chol/HDL Ratio: 4.7 ratio — ABNORMAL HIGH (ref 0.0–4.4)
Cholesterol, Total: 229 mg/dL — ABNORMAL HIGH (ref 100–199)
HDL: 49 mg/dL (ref >39)
LDL Calculated: 143 mg/dL — ABNORMAL HIGH (ref 0–99)
Triglycerides: 187 mg/dL — ABNORMAL HIGH (ref 0–149)
VLDL Cholesterol Cal: 37 mg/dL (ref 5–40)

## 2019-03-20 LAB — TSH: TSH: 1.96 u[IU]/mL (ref 0.450–4.500)

## 2019-03-20 LAB — CMP14+EGFR
ALT: 10 IU/L (ref 0–32)
AST: 15 IU/L (ref 0–40)
Albumin/Globulin Ratio: 2.1 (ref 1.2–2.2)
Albumin: 4.4 g/dL (ref 3.7–4.7)
Alkaline Phosphatase: 62 IU/L (ref 39–117)
BUN/Creatinine Ratio: 10 — ABNORMAL LOW (ref 12–28)
BUN: 21 mg/dL (ref 8–27)
Bilirubin Total: 0.3 mg/dL (ref 0.0–1.2)
CO2: 25 mmol/L (ref 20–29)
Calcium: 9.5 mg/dL (ref 8.7–10.3)
Chloride: 101 mmol/L (ref 96–106)
Creatinine, Ser: 2.03 mg/dL — ABNORMAL HIGH (ref 0.57–1.00)
GFR calc Af Amer: 26 mL/min/{1.73_m2} — ABNORMAL LOW (ref >59)
GFR calc non Af Amer: 23 mL/min/{1.73_m2} — ABNORMAL LOW (ref >59)
Globulin, Total: 2.1 g/dL (ref 1.5–4.5)
Glucose: 85 mg/dL (ref 65–99)
Potassium: 4.7 mmol/L (ref 3.5–5.2)
Sodium: 142 mmol/L (ref 134–144)
Total Protein: 6.5 g/dL (ref 6.0–8.5)

## 2019-03-20 LAB — VITAMIN D 25 HYDROXY (VIT D DEFICIENCY, FRACTURES): Vit D, 25-Hydroxy: 34 ng/mL (ref 30.0–100.0)

## 2019-04-02 ENCOUNTER — Ambulatory Visit (INDEPENDENT_AMBULATORY_CARE_PROVIDER_SITE_OTHER): Payer: Medicare Other | Admitting: Family Medicine

## 2019-04-02 ENCOUNTER — Other Ambulatory Visit: Payer: Self-pay

## 2019-04-02 ENCOUNTER — Encounter: Payer: Self-pay | Admitting: Family Medicine

## 2019-04-02 VITALS — BP 132/80 | HR 79 | Temp 98.4°F | Resp 15 | Ht 62.0 in | Wt 177.0 lb

## 2019-04-02 DIAGNOSIS — I10 Essential (primary) hypertension: Secondary | ICD-10-CM | POA: Diagnosis not present

## 2019-04-02 DIAGNOSIS — G8929 Other chronic pain: Secondary | ICD-10-CM | POA: Diagnosis not present

## 2019-04-02 DIAGNOSIS — E785 Hyperlipidemia, unspecified: Secondary | ICD-10-CM | POA: Diagnosis not present

## 2019-04-02 DIAGNOSIS — Z2821 Immunization not carried out because of patient refusal: Secondary | ICD-10-CM | POA: Diagnosis not present

## 2019-04-02 DIAGNOSIS — E038 Other specified hypothyroidism: Secondary | ICD-10-CM | POA: Diagnosis not present

## 2019-04-02 DIAGNOSIS — M2550 Pain in unspecified joint: Secondary | ICD-10-CM | POA: Diagnosis not present

## 2019-04-02 MED ORDER — PREDNISONE 10 MG PO TABS: ORAL_TABLET | ORAL | 0 refills | Status: DC

## 2019-04-02 MED ORDER — CALCITRIOL 0.25 MCG PO CAPS: ORAL_CAPSULE | ORAL | 5 refills | Status: DC

## 2019-04-02 MED ORDER — ROSUVASTATIN CALCIUM 20 MG PO TABS: ORAL_TABLET | ORAL | 3 refills | Status: DC

## 2019-04-02 MED ORDER — METHADONE HCL 10 MG PO TABS: ORAL_TABLET | ORAL | 0 refills | Status: AC

## 2019-04-02 MED ORDER — LISINOPRIL 2.5 MG PO TABS: 2.5000 mg | ORAL_TABLET | Freq: Every day | ORAL | 5 refills | Status: DC

## 2019-04-02 NOTE — Progress Notes (Signed)
   Katelyn Rowland     MRN: [id number]      DOB: 22-Dec-1940   HPI Katelyn Rowland is here for follow up and re-evaluation of chronic medical conditions, medication management and review of any available recent lab and radiology data.  Preventive health is updated, specifically  Cancer screening and Immunization.   Questions or concerns regarding consultations or procedures which the PT has had in the interim are  addressed. The PT denies any adverse reactions to current medications since the last visit.  There are no new concerns.  There are no specific complaints   ROS Denies recent fever or chills. Denies sinus pressure, nasal congestion, ear pain or sore throat. Denies chest congestion, productive cough or wheezing. Denies chest pains, palpitations and leg swelling Denies abdominal pain, nausea, vomiting,diarrhea or constipation.   Denies dysuria, frequency, hesitancy or incontinence. C/o  joint pain, swelling and limitation in mobility. Denies headaches, seizures, numbness, or tingling. Denies depression, anxiety or insomnia. Denies skin break down or rash.   PE  BP 132/80   Pulse 79   Temp 98.4 F (36.9 C) (Temporal)   Resp 15   Ht 5\' 2"  (1.575 m)   Wt 177 lb (80.3 kg)   SpO2 95%   BMI 32.37 kg/m   Patient alert and oriented and in no cardiopulmonary distress.  HEENT: No facial asymmetry, EOMI,   oropharynx pink and moist.  Neck supple no JVD, no mass.  Chest: Clear to auscultation bilaterally.  CVS: S1, S2 no murmurs, no S3.Regular rate.  ABD: Soft non tender.   Ext: No edema  MS: decreased  ROM spine, shoulders, hips and knees.  Skin: Intact, no ulcerations or rash noted.  Psych: Good eye contact, normal affect. Memory intact not anxious or depressed appearing.  CNS: CN 2-12 intact, power,  normal throughout.no focal deficits noted.   Assessment & Plan  Encounter for chronic pain management The patient's Controlled Substance registry is reviewed and  compliance confirmed. Adequacy of  Pain control and level of function is assessed. Medication dosing is adjusted as deemed appropriate. Twelve weeks of medication is prescribed , patient signs for the script and is provided with a follow up appointment between 11 to 12 weeks .   Essential hypertension Controlled, no change in medication   Generalized joint pain increasingly disabling,hopes to see Rheumatology in the near future  Hyperlipidemia LDL goal <100 Hyperlipidemia:Low fat diet discussed and encouraged.   Lipid Panel  Lab Results  Component Value Date   CHOL 229 (H) 03/19/2019   HDL 49 03/19/2019   LDLCALC 143 (H) 03/19/2019   TRIG 187 (H) 03/19/2019   CHOLHDL 4.7 (H) 03/19/2019  uncontrolled , needs to educe fried and fatty foods, crestor dose is adjusted  Hypothyroidism Controlled, no change in medication   Refused H1N1 influenza virus immunization Re educated re vaccine benefit, however continues to refuse same

## 2019-04-02 NOTE — Patient Instructions (Addendum)
F/U in 13 weeks in office with MD call if you need me before  Reduce crestor to two times weekly  Prednisone is sent in as discussed  Reduce fried and fatty foods  Careful not to fall  Thanks for choosing [facility], we consider it a privelige to serve you.

## 2019-04-03 ENCOUNTER — Telehealth: Payer: Self-pay | Admitting: Family Medicine

## 2019-04-03 NOTE — Telephone Encounter (Signed)
pls let pharmacy know to dispense the twice daily today, she will take that now, and haVE [place] 3 MONTHS

## 2019-04-03 NOTE — Telephone Encounter (Signed)
Pharmacy aware to dispense it

## 2019-04-03 NOTE — Telephone Encounter (Signed)
The pharmacy dispensed the prednisone 10mg  #30 you sent but not the prednisone 10mg  bid for 5 days. They said it was the same rx and was confused about what to do. Please advise

## 2019-04-03 NOTE — Telephone Encounter (Signed)
Pt states that she did not receive her 5 days of prednisone

## 2019-04-05 ENCOUNTER — Encounter: Payer: Self-pay | Admitting: Family Medicine

## 2019-04-05 DIAGNOSIS — Z2821 Immunization not carried out because of patient refusal: Secondary | ICD-10-CM | POA: Insufficient documentation

## 2019-04-05 NOTE — Assessment & Plan Note (Signed)
Re educated re vaccine benefit, however continues to refuse same

## 2019-04-05 NOTE — Assessment & Plan Note (Signed)
The patient's Controlled Substance registry is reviewed and compliance confirmed. Adequacy of  Pain control and level of function is assessed. Medication dosing is adjusted as deemed appropriate. Twelve weeks of medication is prescribed , patient signs for the script and is provided with a follow up appointment between 11 to 12 weeks .  

## 2019-04-05 NOTE — Assessment & Plan Note (Signed)
Controlled, no change in medication  

## 2019-04-05 NOTE — Assessment & Plan Note (Signed)
increasingly disabling,hopes to see Rheumatology in the near future

## 2019-04-05 NOTE — Assessment & Plan Note (Signed)
Hyperlipidemia:Low fat diet discussed and encouraged.   Lipid Panel  Lab Results  Component Value Date   CHOL 229 (H) 03/19/2019   HDL 49 03/19/2019   LDLCALC 143 (H) 03/19/2019   TRIG 187 (H) 03/19/2019   CHOLHDL 4.7 (H) 03/19/2019  uncontrolled , needs to educe fried and fatty foods, crestor dose is adjusted

## 2019-05-05 ENCOUNTER — Other Ambulatory Visit: Payer: Self-pay | Admitting: Family Medicine

## 2019-05-05 ENCOUNTER — Telehealth: Payer: Self-pay | Admitting: Family Medicine

## 2019-05-05 MED ORDER — METHADONE HCL 10 MG PO TABS: ORAL_TABLET | ORAL | 0 refills | Status: DC

## 2019-05-05 NOTE — Telephone Encounter (Signed)
Spoke with pt and medication is prescribed

## 2019-05-05 NOTE — Telephone Encounter (Signed)
Please call the pt regarding her Methadone RX, she states there is nothing there at the pharmacy and she will need them.

## 2019-05-27 ENCOUNTER — Other Ambulatory Visit: Payer: Self-pay | Admitting: Family Medicine

## 2019-06-04 ENCOUNTER — Other Ambulatory Visit: Payer: Self-pay | Admitting: Family Medicine

## 2019-06-09 ENCOUNTER — Other Ambulatory Visit: Payer: Self-pay | Admitting: Family Medicine

## 2019-06-09 MED ORDER — METHADONE HCL 10 MG PO TABS: ORAL_TABLET | ORAL | 0 refills | Status: AC

## 2019-06-09 NOTE — Progress Notes (Signed)
Spoke and gave verbal order to pharmacist, as unable to get e script through, states hard copy in 3 days will suffice , same printed and is to be mailed Methadone prescribed for 30 days  Pls p put in mail the printed script , thanks

## 2019-06-23 ENCOUNTER — Telehealth: Payer: Self-pay | Admitting: *Deleted

## 2019-06-23 NOTE — Telephone Encounter (Signed)
Phone visit OK.

## 2019-06-23 NOTE — Telephone Encounter (Signed)
Let patient know this would be done over the phone

## 2019-06-23 NOTE — Telephone Encounter (Signed)
Can you still do this as a pain management visit if its over the phone?

## 2019-06-23 NOTE — Telephone Encounter (Signed)
Pt has an upcoming appt on 11/19 looks like pain management wants to know if this can be done over the phone she has a sinus infection and she really is not wanting to come into the office.

## 2019-06-26 ENCOUNTER — Encounter: Payer: Self-pay | Admitting: Family Medicine

## 2019-06-26 ENCOUNTER — Other Ambulatory Visit: Payer: Self-pay

## 2019-06-26 ENCOUNTER — Ambulatory Visit (INDEPENDENT_AMBULATORY_CARE_PROVIDER_SITE_OTHER): Payer: Medicare Other | Admitting: Family Medicine

## 2019-06-26 VITALS — BP 132/78 | Ht 62.0 in | Wt 174.5 lb

## 2019-06-26 DIAGNOSIS — E785 Hyperlipidemia, unspecified: Secondary | ICD-10-CM | POA: Diagnosis not present

## 2019-06-26 DIAGNOSIS — Z2821 Immunization not carried out because of patient refusal: Secondary | ICD-10-CM

## 2019-06-26 DIAGNOSIS — I10 Essential (primary) hypertension: Secondary | ICD-10-CM | POA: Diagnosis not present

## 2019-06-26 DIAGNOSIS — E1169 Type 2 diabetes mellitus with other specified complication: Secondary | ICD-10-CM | POA: Diagnosis not present

## 2019-06-26 DIAGNOSIS — G8929 Other chronic pain: Secondary | ICD-10-CM | POA: Diagnosis not present

## 2019-06-26 DIAGNOSIS — E038 Other specified hypothyroidism: Secondary | ICD-10-CM | POA: Diagnosis not present

## 2019-06-26 DIAGNOSIS — M5116 Intervertebral disc disorders with radiculopathy, lumbar region: Secondary | ICD-10-CM | POA: Diagnosis not present

## 2019-06-26 DIAGNOSIS — E669 Obesity, unspecified: Secondary | ICD-10-CM | POA: Diagnosis not present

## 2019-06-26 MED ORDER — TRAZODONE HCL 100 MG PO TABS: 100.0000 mg | ORAL_TABLET | Freq: Every day | ORAL | 5 refills | Status: DC

## 2019-06-26 MED ORDER — TIZANIDINE HCL 4 MG PO TABS: ORAL_TABLET | ORAL | 5 refills | Status: DC

## 2019-06-26 MED ORDER — AMLODIPINE BESYLATE 2.5 MG PO TABS: 2.5000 mg | ORAL_TABLET | Freq: Every day | ORAL | 5 refills | Status: DC

## 2019-06-26 MED ORDER — PREDNISONE 10 MG PO TABS: 10.0000 mg | ORAL_TABLET | Freq: Two times a day (BID) | ORAL | 0 refills | Status: AC

## 2019-06-26 MED ORDER — PREDNISONE 10 MG PO TABS: ORAL_TABLET | ORAL | 0 refills | Status: DC

## 2019-06-26 NOTE — Patient Instructions (Addendum)
F/U end [facility] office, call if you need me sooner  Medication is prescribed as we discussed.  pls get fasting lipid,, cmp and EGFr, tSH and hBA1C and cBC 1 week before next visit  Careful not to fall, and stay safe  Thanks for choosing [facility], we consider it a privelige to serve you.

## 2019-06-26 NOTE — Progress Notes (Signed)
Virtual Visit via Telephone Note  I connected with Katelyn Rowland on 06/26/19 at  1:00 PM EST by telephone and verified that I am speaking with the correct person using two identifiers.  Location: Patient: home Provider: office   I discussed the limitations, risks, security and privacy concerns of performing an evaluation and management service by telephone and the availability of in person appointments. I also discussed with the patient that there may be a patient responsible charge related to this service. The patient expressed understanding and agreed to proceed.   History of Present Illness: F/u chronic problems, and update pain management  Denies recent fever or chills. Denies sinus pressure, nasal congestion, ear pain or sore throat. Denies chest congestion, productive cough or wheezing. Denies chest pains, palpitations and leg swelling Denies abdominal pain, nausea, vomiting,diarrhea or constipation.   Denies dysuria, frequency, hesitancy or incontinence. Denies joint pain, swelling and limitation in mobility. Denies headaches, seizures, numbness, or tingling. Denies depression, anxiety or insomnia. Denies skin break down or rash.       Observations/Objective: BP 132/78   Ht 5\' 2"  (1.575 m)   Wt 174 lb 8 oz (79.2 kg)   BMI 31.92 kg/m  Good communication with no confusion and intact memory. Alert and oriented x 3 No signs of respiratory distress during speech    Assessment and Plan: Essential hypertension Controlled, no change in medication DASH diet and commitment to daily physical activity for a minimum of 30 minutes discussed and encouraged, as a part of hypertension management. The importance of attaining a healthy weight is also discussed.  BP/Weight 06/26/2019 04/02/2019 03/06/2019 03/02/2019 01/15/2019 12/24/2018 11/22/4079  Systolic BP 448 185 631 497 026 378 588  Diastolic BP 78 80 80 94 84 84 84  Wt. (Lbs) 174.5 177 178 180 180 180 179  BMI 31.92 32.37 32.56  32.92 32.92 32.92 32.74       Encounter for chronic pain management The patient's Controlled Substance registry is reviewed and compliance confirmed. Adequacy of  Pain control and level of function is assessed. Medication dosing is adjusted as deemed appropriate. Twelve weeks of medication is prescribed , patient signs for the script and is provided with a follow up appointment between 11 to 12 weeks .   Hyperlipidemia LDL goal <100 Hyperlipidemia:Low fat diet discussed and encouraged.   Lipid Panel  Lab Results  Component Value Date   CHOL 229 (H) 03/19/2019   HDL 49 03/19/2019   LDLCALC 143 (H) 03/19/2019   TRIG 187 (H) 03/19/2019   CHOLHDL 4.7 (H) 03/19/2019   Updated lab needed at/ before next visit.     Hypothyroidism Updated lab needed at/ before next visit.   Lumbar disc disease with radiculopathy Short course prednisone prescribed for acuteflare as well as for chronic pain management in bursts  Refused H1N1 influenza virus immunization Again offered and continues to refuse    Follow Up Instructions:    I discussed the assessment and treatment plan with the patient. The patient was provided an opportunity to ask questions and all were answered. The patient agreed with the plan and demonstrated an understanding of the instructions.   The patient was advised to call back or seek an in-person evaluation if the symptoms worsen or if the condition fails to improve as anticipated.  I provided 25  minutes of non-face-to-face time during this encounter.   Tula Nakayama, MD

## 2019-06-28 ENCOUNTER — Encounter: Payer: Self-pay | Admitting: Family Medicine

## 2019-06-28 MED ORDER — METHADONE HCL 10 MG PO TABS: ORAL_TABLET | ORAL | 0 refills | Status: AC

## 2019-06-28 MED ORDER — METHADONE HCL 10 MG PO TABS: ORAL_TABLET | ORAL | 0 refills | Status: DC

## 2019-06-28 NOTE — Assessment & Plan Note (Signed)
Hyperlipidemia:Low fat diet discussed and encouraged.   Lipid Panel  Lab Results  Component Value Date   CHOL 229 (H) 03/19/2019   HDL 49 03/19/2019   LDLCALC 143 (H) 03/19/2019   TRIG 187 (H) 03/19/2019   CHOLHDL 4.7 (H) 03/19/2019   Updated lab needed at/ before next visit.

## 2019-06-28 NOTE — Assessment & Plan Note (Signed)
Controlled, no change in medication DASH diet and commitment to daily physical activity for a minimum of 30 minutes discussed and encouraged, as a part of hypertension management. The importance of attaining a healthy weight is also discussed.  BP/Weight 06/26/2019 04/02/2019 03/06/2019 03/02/2019 01/15/2019 12/24/2018 8/56/3149  Systolic BP 702 637 858 850 277 412 878  Diastolic BP 78 80 80 94 84 84 84  Wt. (Lbs) 174.5 177 178 180 180 180 179  BMI 31.92 32.37 32.56 32.92 32.92 32.92 32.74

## 2019-06-28 NOTE — Assessment & Plan Note (Signed)
Short course prednisone prescribed for acuteflare as well as for chronic pain management in bursts

## 2019-06-28 NOTE — Assessment & Plan Note (Signed)
Again offered and continues to refuse

## 2019-06-28 NOTE — Assessment & Plan Note (Signed)
The patient's Controlled Substance registry is reviewed and compliance confirmed. Adequacy of  Pain control and level of function is assessed. Medication dosing is adjusted as deemed appropriate. Twelve weeks of medication is prescribed , patient signs for the script and is provided with a follow up appointment between 11 to 12 weeks .  

## 2019-06-28 NOTE — Assessment & Plan Note (Signed)
Updated lab needed at/ before next visit.   

## 2019-08-07 ENCOUNTER — Telehealth: Payer: Self-pay | Admitting: *Deleted

## 2019-08-07 NOTE — Telephone Encounter (Signed)
[facility] is down delivery drivers and cannot deliver today or tomorrow. They will be open tomorrow if she can get someone to get it today or tomorrow

## 2019-08-07 NOTE — Telephone Encounter (Signed)
Pt called said she is not due to have her refill on her methadone until tomorrow but it is delivered to her home and they are doing no deliveries tomorrow. She will be out of her medication. Wanted to know if Dr Moshe Cipro would approve it being refilled today by 3pm so they could deliver it before tomorrow. She would like a call back.

## 2019-08-07 NOTE — Telephone Encounter (Signed)
PLS GIVE VERBAL ok FOR REFILL TODAY

## 2019-09-18 ENCOUNTER — Telehealth: Payer: Self-pay

## 2019-09-18 ENCOUNTER — Other Ambulatory Visit: Payer: Self-pay

## 2019-09-18 DIAGNOSIS — N184 Chronic kidney disease, stage 4 (severe): Secondary | ICD-10-CM

## 2019-09-18 MED ORDER — CALCITRIOL 0.25 MCG PO CAPS: 0.2500 ug | ORAL_CAPSULE | ORAL | 1 refills | Status: DC

## 2019-09-18 NOTE — Telephone Encounter (Signed)
Medication refilled and sent to pharmacy for patient. Patient aware.

## 2019-09-18 NOTE — Telephone Encounter (Signed)
Pt would like to know what the following medication is not being called in calcitRIOL (ROCALTROL) 0.25 MCG capsule.  Please call her

## 2019-09-22 ENCOUNTER — Other Ambulatory Visit: Payer: Self-pay | Admitting: Family Medicine

## 2019-09-29 ENCOUNTER — Encounter: Payer: Self-pay | Admitting: Family Medicine

## 2019-09-29 ENCOUNTER — Other Ambulatory Visit: Payer: Self-pay

## 2019-09-29 ENCOUNTER — Ambulatory Visit (INDEPENDENT_AMBULATORY_CARE_PROVIDER_SITE_OTHER): Payer: Medicare Other | Admitting: Family Medicine

## 2019-09-29 ENCOUNTER — Other Ambulatory Visit (HOSPITAL_COMMUNITY)
Admission: RE | Admit: 2019-09-29 | Discharge: 2019-09-29 | Disposition: A | Discharge status: Home / Self Care | Payer: Medicare Other | Source: Ambulatory Visit | Attending: Family Medicine | Admitting: Family Medicine

## 2019-09-29 VITALS — BP 120/70 | HR 100 | Temp 97.0°F | Resp 15 | Ht 62.0 in | Wt 178.1 lb

## 2019-09-29 DIAGNOSIS — R7303 Prediabetes: Secondary | ICD-10-CM

## 2019-09-29 DIAGNOSIS — E785 Hyperlipidemia, unspecified: Secondary | ICD-10-CM

## 2019-09-29 DIAGNOSIS — Z9181 History of falling: Secondary | ICD-10-CM | POA: Diagnosis not present

## 2019-09-29 DIAGNOSIS — G8929 Other chronic pain: Secondary | ICD-10-CM

## 2019-09-29 DIAGNOSIS — E669 Obesity, unspecified: Secondary | ICD-10-CM | POA: Diagnosis not present

## 2019-09-29 DIAGNOSIS — I10 Essential (primary) hypertension: Secondary | ICD-10-CM | POA: Diagnosis not present

## 2019-09-29 DIAGNOSIS — E038 Other specified hypothyroidism: Secondary | ICD-10-CM | POA: Diagnosis not present

## 2019-09-29 DIAGNOSIS — Z0289 Encounter for other administrative examinations: Secondary | ICD-10-CM | POA: Insufficient documentation

## 2019-09-29 DIAGNOSIS — E1169 Type 2 diabetes mellitus with other specified complication: Secondary | ICD-10-CM | POA: Diagnosis not present

## 2019-09-29 MED ORDER — PREDNISONE 10 MG PO TABS: 10.0000 mg | ORAL_TABLET | Freq: Two times a day (BID) | ORAL | 0 refills | Status: DC

## 2019-09-29 MED ORDER — PREDNISONE 10 MG PO TABS: ORAL_TABLET | ORAL | 0 refills | Status: DC

## 2019-09-29 MED ORDER — METHADONE HCL 10 MG PO TABS: ORAL_TABLET | ORAL | 0 refills | Status: DC

## 2019-09-29 MED ORDER — METHADONE HCL 10 MG PO TABS: ORAL_TABLET | ORAL | 0 refills | Status: AC

## 2019-09-29 NOTE — Assessment & Plan Note (Signed)
Updated lab needed at/ before next visit. Controlled when lAST CHECKED

## 2019-09-29 NOTE — Assessment & Plan Note (Signed)
The patient's Controlled Substance registry is reviewed and compliance confirmed. Adequacy of  Pain control and level of function is assessed. Medication dosing is adjusted as deemed appropriate. Twelve weeks of medication is prescribed , patient signs for the script and is provided with a follow up appointment between 11 to 12 weeks .  

## 2019-09-29 NOTE — Assessment & Plan Note (Signed)
Hyperlipidemia:Low fat diet discussed and encouraged.   Lipid Panel  Lab Results  Component Value Date   CHOL 229 (H) 03/19/2019   HDL 49 03/19/2019   LDLCALC 143 (H) 03/19/2019   TRIG 187 (H) 03/19/2019   CHOLHDL 4.7 (H) 03/19/2019   Updated lab needed at/ before next visit. uncontrolled

## 2019-09-29 NOTE — Assessment & Plan Note (Signed)
Ms. Cerro is reminded of the importance of commitment to daily physical activity for 30 minutes or more, as able and the need to limit carbohydrate intake to 30 to 60 grams per meal to help with blood sugar control.     Updated lab needed at/ before next visit.  Ms. Nickolson is reminded of the importance of daily foot exam, annual eye examination, and good blood sugar, blood pressure and cholesterol control.  Diabetic Labs Latest Ref Rng & Units 03/19/2019 06/18/2018 01/01/2018 08/31/2017 04/12/2017  HbA1c 4.8 - 5.6 % - 5.5 - 6.0(H) -  Microalbumin Not Estab. ug/mL - - - - 487.9(H)  Micro/Creat Ratio 0.0 - 30.0 mg/g creat - - - - 555.1(H)  Chol 100 - 199 mg/dL 229(H) 116 117 134 -  HDL >39 mg/dL 49 45 44 47 -  Calc LDL 0 - 99 mg/dL 143(H) 45 51 53 -  Triglycerides 0 - 149 mg/dL 187(H) 128 108 168(H) -  Creatinine 0.57 - 1.00 mg/dL 2.03(H) 1.78(H) 1.72(H) 1.77(H) -   BP/Weight 09/29/2019 06/26/2019 04/02/2019 03/06/2019 03/02/2019 01/15/2019 12/18/6045  Systolic BP 998 721 587 276 184 859 276  Diastolic BP 70 78 80 80 94 84 84  Wt. (Lbs) 178.12 174.5 177 178 180 180 180  BMI 32.58 31.92 32.37 32.56 32.92 32.92 32.92   Foot/eye exam completion dates 07/11/2018 04/11/2017  Foot exam Order - -  Foot Form Completion Done Done

## 2019-09-29 NOTE — Assessment & Plan Note (Signed)
  Patient re-educated about  the importance of commitment to a  minimum of 150 minutes of exercise per week as able.  The importance of healthy food choices with portion control discussed, as well as eating regularly and within a 12 hour window most days. The need to choose "clean , green" food 50 to 75% of the time is discussed, as well as to make water the primary drink and set a goal of 64 ounces water daily.    Weight /BMI 09/29/2019 06/26/2019 04/02/2019  WEIGHT 178 lb 1.9 oz 174 lb 8 oz 177 lb  HEIGHT 5\' 2"  5\' 2"  5\' 2"   BMI 32.58 kg/m2 31.92 kg/m2 32.37 kg/m2

## 2019-09-29 NOTE — Assessment & Plan Note (Signed)
Controlled, no change in medication DASH diet and commitment to daily physical activity for a minimum of 30 minutes discussed and encouraged, as a part of hypertension management. The importance of attaining a healthy weight is also discussed.  BP/Weight 09/29/2019 06/26/2019 04/02/2019 03/06/2019 03/02/2019 01/15/2019 0/85/6943  Systolic BP 700 525 910 289 022 840 698  Diastolic BP 70 78 80 80 94 84 84  Wt. (Lbs) 178.12 174.5 177 178 180 180 180  BMI 32.58 31.92 32.37 32.56 32.92 32.92 32.92

## 2019-09-29 NOTE — Patient Instructions (Signed)
Phone  Visit with MD in 13 weeks, pain management,m call if you need me sooner  No medication changes  Thanks for choosing [facility], we consider it a privelige to serve you.

## 2019-09-29 NOTE — Assessment & Plan Note (Signed)
Home safety reviewed briefly, has had no falls since last visit

## 2019-09-29 NOTE — Progress Notes (Signed)
Katelyn Rowland     MRN: [id number]      DOB: 12-20-1940   HPI Katelyn Rowland is here for follow up and re-evaluation of chronic medical conditions, medication management and review of any available recent lab and radiology data.  Preventive health is updated, specifically  Cancer screening and Immunization.   Questions or concerns regarding consultations or procedures which the PT has had in the interim are  addressed. The PT denies any adverse reactions to current medications since the last visit.  There are no new concerns.  There are no specific complaints   ROS Denies recent fever or chills. Denies sinus pressure, nasal congestion, ear pain or sore throat. Denies chest congestion, productive cough or wheezing. Denies chest pains, palpitations and leg swelling Denies abdominal pain, nausea, vomiting,diarrhea or constipation.   Denies dysuria, frequency, hesitancy or incontinence. Denies uncontrolled joint pain, swelling and limitation in mobility. Denies headaches, seizures, numbness, or tingling. Denies depression, anxiety or insomnia. Denies skin break down or rash.   PE  BP 120/70   Pulse 100   Temp (!) 97 F (36.1 C) (Temporal)   Resp 15   Ht 5\' 2"  (1.575 m)   Wt 178 lb 1.9 oz (80.8 kg)   SpO2 99%   BMI 32.58 kg/m    Patient alert and oriented and in no cardiopulmonary distress.  HEENT: No facial asymmetry, EOMI,     Neck supple .  Chest: Clear to auscultation bilaterally.  CVS: S1, S2 no murmurs, no S3.Regular rate.  ABD: Soft non tender.   Ext: No edema  MS: decreased ROM spine, shoulders, hips and knees.  Skin: Intact, no ulcerations or rash noted.  Psych: Good eye contact, normal affect. Memory intact not anxious or depressed appearing.  CNS: CN 2-12 intact, power,  normal throughout.no focal deficits noted.   Assessment & Plan  Hypothyroidism Updated lab needed at/ before next visit. Controlled when lAST CHECKED  Hyperlipidemia LDL goal  <100 Hyperlipidemia:Low fat diet discussed and encouraged.   Lipid Panel  Lab Results  Component Value Date   CHOL 229 (H) 03/19/2019   HDL 49 03/19/2019   LDLCALC 143 (H) 03/19/2019   TRIG 187 (H) 03/19/2019   CHOLHDL 4.7 (H) 03/19/2019   Updated lab needed at/ before next visit. uncontrolled    Essential hypertension Controlled, no change in medication DASH diet and commitment to daily physical activity for a minimum of 30 minutes discussed and encouraged, as a part of hypertension management. The importance of attaining a healthy weight is also discussed.  BP/Weight 09/29/2019 06/26/2019 04/02/2019 03/06/2019 03/02/2019 01/15/2019 5/80/9983  Systolic BP 382 505 397 673 419 379 024  Diastolic BP 70 78 80 80 94 84 84  Wt. (Lbs) 178.12 174.5 177 178 180 180 180  BMI 32.58 31.92 32.37 32.56 32.92 32.92 32.92       Encounter for chronic pain management The patient's Controlled Substance registry is reviewed and compliance confirmed. Adequacy of  Pain control and level of function is assessed. Medication dosing is adjusted as deemed appropriate. Twelve weeks of medication is prescribed , patient signs for the script and is provided with a follow up appointment between 11 to 12 weeks .   At high risk for falls Home safety reviewed briefly, has had no falls since last visit  Obesity (BMI 30.0-34.9)  Patient re-educated about  the importance of commitment to a  minimum of 150 minutes of exercise per week as able.  The importance of healthy  food choices with portion control discussed, as well as eating regularly and within a 12 hour window most days. The need to choose "clean , green" food 50 to 75% of the time is discussed, as well as to make water the primary drink and set a goal of 64 ounces water daily.    Weight /BMI 09/29/2019 06/26/2019 04/02/2019  WEIGHT 178 lb 1.9 oz 174 lb 8 oz 177 lb  HEIGHT 5\' 2"  5\' 2"  5\' 2"   BMI 32.58 kg/m2 31.92 kg/m2 32.37 kg/m2       Prediabetes Katelyn Rowland is reminded of the importance of commitment to daily physical activity for 30 minutes or more, as able and the need to limit carbohydrate intake to 30 to 60 grams per meal to help with blood sugar control.     Updated lab needed at/ before next visit.  Katelyn Rowland is reminded of the importance of daily foot exam, annual eye examination, and good blood sugar, blood pressure and cholesterol control.  Diabetic Labs Latest Ref Rng & Units 03/19/2019 06/18/2018 01/01/2018 08/31/2017 04/12/2017  HbA1c 4.8 - 5.6 % - 5.5 - 6.0(H) -  Microalbumin Not Estab. ug/mL - - - - 487.9(H)  Micro/Creat Ratio 0.0 - 30.0 mg/g creat - - - - 555.1(H)  Chol 100 - 199 mg/dL 229(H) 116 117 134 -  HDL >39 mg/dL 49 45 44 47 -  Calc LDL 0 - 99 mg/dL 143(H) 45 51 53 -  Triglycerides 0 - 149 mg/dL 187(H) 128 108 168(H) -  Creatinine 0.57 - 1.00 mg/dL 2.03(H) 1.78(H) 1.72(H) 1.77(H) -   BP/Weight 09/29/2019 06/26/2019 04/02/2019 03/06/2019 03/02/2019 01/15/2019 2/84/1324  Systolic BP 401 027 253 664 403 474 259  Diastolic BP 70 78 80 80 94 84 84  Wt. (Lbs) 178.12 174.5 177 178 180 180 180  BMI 32.58 31.92 32.37 32.56 32.92 32.92 32.92   Foot/eye exam completion dates 07/11/2018 04/11/2017  Foot exam Order - -  Foot Form Completion Done Done

## 2019-09-30 ENCOUNTER — Telehealth: Payer: Self-pay | Admitting: *Deleted

## 2019-09-30 LAB — LIPID PANEL
Cholesterol: 139 mg/dL (ref <200)
HDL: 54 mg/dL (ref >=50)
LDL Cholesterol (Calc): 63 mg/dL (calc)
Non-HDL Cholesterol (Calc): 85 mg/dL (calc) (ref <130)
Total CHOL/HDL Ratio: 2.6 (calc) (ref <5.0)
Triglycerides: 135 mg/dL (ref <150)

## 2019-09-30 LAB — COMPLETE METABOLIC PANEL WITH GFR
AG Ratio: 1.7 (calc) (ref 1.0–2.5)
ALT: 9 U/L (ref 6–29)
AST: 13 U/L (ref 10–35)
Albumin: 4.3 g/dL (ref 3.6–5.1)
Alkaline phosphatase (APISO): 49 U/L (ref 37–153)
BUN/Creatinine Ratio: 12 (calc) (ref 6–22)
BUN: 23 mg/dL (ref 7–25)
CO2: 30 mmol/L (ref 20–32)
Calcium: 9.7 mg/dL (ref 8.6–10.4)
Chloride: 104 mmol/L (ref 98–110)
Creat: 1.93 mg/dL — ABNORMAL HIGH (ref 0.60–0.93)
GFR, Est African American: 28 mL/min/{1.73_m2} — ABNORMAL LOW (ref >=60)
GFR, Est Non African American: 24 mL/min/{1.73_m2} — ABNORMAL LOW (ref >=60)
Globulin: 2.5 g/dL (calc) (ref 1.9–3.7)
Glucose, Bld: 82 mg/dL (ref 65–99)
Potassium: 5.1 mmol/L (ref 3.5–5.3)
Sodium: 140 mmol/L (ref 135–146)
Total Bilirubin: 0.6 mg/dL (ref 0.2–1.2)
Total Protein: 6.8 g/dL (ref 6.1–8.1)

## 2019-09-30 LAB — CBC
HCT: 41 % (ref 35.0–45.0)
Hemoglobin: 13.6 g/dL (ref 11.7–15.5)
MCH: 31.9 pg (ref 27.0–33.0)
MCHC: 33.2 g/dL (ref 32.0–36.0)
MCV: 96.2 fL (ref 80.0–100.0)
MPV: 11.3 fL (ref 7.5–12.5)
Platelets: 181 10*3/uL (ref 140–400)
RBC: 4.26 10*6/uL (ref 3.80–5.10)
RDW: 11.8 % (ref 11.0–15.0)
WBC: 7.9 10*3/uL (ref 3.8–10.8)

## 2019-09-30 LAB — HEMOGLOBIN A1C
Hgb A1c MFr Bld: 6 % of total Hgb — ABNORMAL HIGH (ref <5.7)
Mean Plasma Glucose: 126 (calc)
eAG (mmol/L): 7 (calc)

## 2019-09-30 LAB — TSH: TSH: 2.76 mIU/L (ref 0.40–4.50)

## 2019-09-30 NOTE — Telephone Encounter (Signed)
Pt is aware of lab results verbalized understanding

## 2019-09-30 NOTE — Telephone Encounter (Signed)
-----   Message from Fayrene Helper, MD sent at 09/30/2019 12:49 PM EST ----- Pls let her know , her cholesterol, thyroid function and liver function are all excellent, no med change Kidney function is improved, great! Blood sugar still pre diabetic, a little higher , keep counting carbs and avoiding sugar, only healthy sugar is fruit, fresh / frozen Overall excellent, keep up the great work!

## 2019-10-01 LAB — MISC LABCORP TEST (SEND OUT): Labcorp test code: 738526

## 2019-10-07 ENCOUNTER — Other Ambulatory Visit: Payer: Self-pay | Admitting: Family Medicine

## 2019-10-09 ENCOUNTER — Ambulatory Visit: Payer: Medicare Other | Admitting: Family Medicine

## 2019-11-20 ENCOUNTER — Other Ambulatory Visit: Payer: Self-pay | Admitting: Family Medicine

## 2019-12-11 ENCOUNTER — Other Ambulatory Visit: Payer: Self-pay | Admitting: Family Medicine

## 2019-12-22 ENCOUNTER — Ambulatory Visit: Payer: Medicare Other | Admitting: Family Medicine

## 2019-12-30 ENCOUNTER — Telehealth (INDEPENDENT_AMBULATORY_CARE_PROVIDER_SITE_OTHER): Payer: Medicare Other | Admitting: Family Medicine

## 2019-12-30 ENCOUNTER — Encounter: Payer: Self-pay | Admitting: Family Medicine

## 2019-12-30 ENCOUNTER — Other Ambulatory Visit: Payer: Self-pay

## 2019-12-30 VITALS — BP 123/71 | Ht 62.0 in | Wt 178.0 lb

## 2019-12-30 DIAGNOSIS — M5116 Intervertebral disc disorders with radiculopathy, lumbar region: Secondary | ICD-10-CM | POA: Diagnosis not present

## 2019-12-30 DIAGNOSIS — R52 Pain, unspecified: Secondary | ICD-10-CM | POA: Diagnosis not present

## 2019-12-30 DIAGNOSIS — Z9181 History of falling: Secondary | ICD-10-CM | POA: Diagnosis not present

## 2019-12-30 DIAGNOSIS — G8929 Other chronic pain: Secondary | ICD-10-CM

## 2019-12-30 MED ORDER — METHADONE HCL 10 MG PO TABS: ORAL_TABLET | ORAL | 0 refills | Status: AC

## 2019-12-30 NOTE — Assessment & Plan Note (Signed)
Deformity and debility of both hands, fingers have nodules, function of both hands is decreasing.  Continues to have joint discomfort and pain and arthritis.  Continue methadone treatment at this time.

## 2019-12-30 NOTE — Progress Notes (Signed)
Virtual Visit via Telephone Note   This visit type was conducted due to national recommendations for restrictions regarding the COVID-19 Pandemic (e.g. social distancing) in an effort to limit this patient's exposure and mitigate transmission in our community.  Due to her co-morbid illnesses, this patient is at least at moderate risk for complications without adequate follow up.  This format is felt to be most appropriate for this patient at this time.  The patient did not have access to video technology/had technical difficulties with video requiring transitioning to audio format only (telephone).  All issues noted in this document were discussed and addressed.  No physical exam could be performed with this format.    Evaluation Performed:  Follow-up visit  Date:  12/30/2019   ID:  Katelyn Rowland, Katelyn Rowland 16-May-1941, MRN [id number]  Patient Location: Home Provider Location: Office  Location of Patient: Home Location of Provider: Telehealth Consent was obtain for visit to be over via telehealth. I verified that I am speaking with the correct person using two identifiers.  PCP:  Fayrene Helper, MD   Chief Complaint: Pain management  History of Present Illness:    Katelyn Rowland is a 79 y.o. female with history of arthritis, chronic back pain, chronic joint pain among others.  Here today for pain management.  Discussed the use of medications and how she takes her medications.  She denies missing her medications and taking them as directed.  She reports that she is not having any dizziness or oversedation or any falls since her last appointment.  Indication for chronic opioid: overall pain-arthritis  Medication and dose: Methadone 10 mg Three times daily # pills per month: 90 Last UDS date: Feb  Opioid Treatment Agreement signed (Y/N): Y Opioid Treatment Agreement last reviewed with patient: 10/02/2018  1:20 PM [place] reviewed this encounter (include red flags): Yes  The patient  does not have symptoms concerning for COVID-19 infection (fever, chills, cough, or new shortness of breath).   Past Medical, Surgical, Social History, Allergies, and Medications have been Reviewed.  Past Medical History:  Diagnosis Date  . Anemia   . Anxiety   . Arthritis   . Chronic kidney disease   . Depression   . Diabetes mellitus 2009   no meds  . 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 Edema of both legs   . GERD (gastroesophageal reflux disease)   . Headache(784.0)   . Hematoma of leg    left   . High blood pressure   . Hypertension    Katelyn Rowland  [id number]  . Muscle cramps   . Neck pain   . PONV (postoperative nausea and vomiting)   . Reflux   . Thyroid disorder    Past Surgical History:  Procedure Laterality Date  . ABDOMINAL HYSTERECTOMY    . APPENDECTOMY    . BIOPSY N/A 11/20/2014   Procedure: BIOPSY;  Surgeon: Rogene Houston, MD;  Location: [facility];  Service: Endoscopy;  Laterality: N/A;  . CERVICAL SPINE SURGERY    . ESOPHAGEAL DILATION N/A 11/20/2014   Procedure: ESOPHAGEAL DILATION [place];  Surgeon: Rogene Houston, MD;  Location: [facility];  Service: Endoscopy;  Laterality: N/A;  . ESOPHAGOGASTRODUODENOSCOPY (EGD) WITH PROPOFOL N/A 11/20/2014   Procedure: ESOPHAGOGASTRODUODENOSCOPY (EGD) WITH PROPOFOL;  Surgeon: Rogene Houston, MD;  Location: [facility];  Service: Endoscopy;  Laterality: N/A;  . FOOT NEUROMA SURGERY  right   . ivc filter placement, following recurrent left DVT  07/2009  . NASAL SINUS SURGERY    . THORACIC DISCECTOMY  06/12/2012   Procedure: THORACIC DISCECTOMY;  Surgeon: Floyce Stakes, MD;  Location: [facility];  Service: Neurosurgery;  Laterality: N/A;  Thoracic Ten-Eleven Thoracic Laminectomy  . TONSILLECTOMY    . TONSILLECTOMY AND ADENOIDECTOMY    . unilateal oopherectomy    . VESICOVAGINAL FISTULA CLOSURE W/ TAH       Current Meds  Medication Sig  . amLODipine  (NORVASC) 2.5 MG tablet Take 1 tablet (2.5 mg total) by mouth daily.  . calcitRIOL (ROCALTROL) 0.25 MCG capsule Take 1 capsule (0.25 mcg total) by mouth 3 (three) times a week.  . diphenhydrAMINE (BENADRYL) 25 mg capsule Take 25 mg by mouth every 8 (eight) hours as needed for allergies.  Marland Kitchen esomeprazole (NEXIUM) 40 MG capsule TAKE ONE CAPSULE BY MOUTH DAILY BEFORE BREAKFAST.  Marland Kitchen levothyroxine (SYNTHROID) 100 MCG tablet TAKE 1 TABLET BY MOUTH ONCE A DAY.  Marland Kitchen lisinopril (ZESTRIL) 2.5 MG tablet TAKE 1 TABLET BY MOUTH ONCE A DAY.  . methadone (DOLOPHINE) 10 MG tablet Take one tablet by mouth three times daily for chronic pain  . polyethylene glycol powder (GLYCOLAX/MIRALAX) powder MIX 1 CAPFUL (17 GRAMS) WITH 8OZ OF WATER OR JUICE DAILY.  Marland Kitchen predniSONE (DELTASONE) 10 MG tablet Take one tablet by mouth once daily, as needed, for pain  . rosuvastatin (CRESTOR) 20 MG tablet Take one tablet by mouth  Two times weekly, every Monday and Thursday only  . tiZANidine (ZANAFLEX) 4 MG tablet TAKE 1 TABLET BY MOUTH 2 TIMES DAILY.  . traZODone (DESYREL) 100 MG tablet Take 1 tablet (100 mg total) by mouth at bedtime.     Allergies:   Prednisone, Acetaminophen, Other, Sulfur, Warfarin sodium, and Cymbalta [duloxetine hcl]   ROS:   Please see the history of present illness.    All other systems reviewed and are negative.   Labs/Other Tests and Data Reviewed:    Recent Labs: 09/29/2019: ALT 9; BUN 23; Creat 1.93; Hemoglobin 13.6; Platelets 181; Potassium 5.1; Sodium 140; TSH 2.76   Recent Lipid Panel Lab Results  Component Value Date/Time   CHOL 139 09/29/2019 11:07 AM   CHOL 229 (H) 03/19/2019 01:44 PM   TRIG 135 09/29/2019 11:07 AM   HDL 54 09/29/2019 11:07 AM   HDL 49 03/19/2019 01:44 PM   CHOLHDL 2.6 09/29/2019 11:07 AM   LDLCALC 63 09/29/2019 11:07 AM    Wt Readings from Last 3 Encounters:  12/30/19 178 lb (80.7 kg)  09/29/19 178 lb 1.9 oz (80.8 kg)  06/26/19 174 lb 8 oz (79.2 kg)      Objective:    Vital Signs:  BP 123/71   Ht 5\' 2"  (1.575 m)   Wt 178 lb (80.7 kg)   BMI 32.56 kg/m    VITAL SIGNS:  reviewed GEN:  alert and oriented RESPIRATORY:  no shortness of breath noted in conversation  PSYCH:  normal affect and mood   ASSESSMENT & PLAN:    1. Encounter for chronic pain management  - methadone (DOLOPHINE) 10 MG tablet; Take one tablet by mouth three times daily for chronic pain  Dispense: 90 tablet; Refill: 0  2. Lumbar disc disease with radiculopathy  - methadone (DOLOPHINE) 10 MG tablet; Take one tablet by mouth three times daily for chronic pain  Dispense: 90 tablet; Refill: 0  3. Generalized pain  - methadone (DOLOPHINE) 10 MG tablet;  Take one tablet by mouth three times daily for chronic pain  Dispense: 90 tablet; Refill: 0  4. At high risk for falls   Time:   Today, I have spent 10 minutes with the patient with telehealth technology discussing the above problems.     Medication Adjustments/Labs and Tests Ordered: Current medicines are reviewed at length with the patient today.  Concerns regarding medicines are outlined above.   Tests Ordered: No orders of the defined types were placed in this encounter.   Medication Changes: No orders of the defined types were placed in this encounter.   Disposition:  Follow up 12 weeks  Signed, Perlie Mayo, NP  12/30/2019 3:32 PM     [facility]

## 2019-12-30 NOTE — Assessment & Plan Note (Signed)
The patient's controlled substance registry is reviewed and compliance confirmed.  Adequacy of pain control and level of function assessed.  Medication dose is adjusted to a day appropriate.  1 month medication was prescribed.  All further months will be prescribed by Dr. Moshe Cipro.  Appointment will be in 11 to 12 weeks for follow-up.

## 2019-12-30 NOTE — Assessment & Plan Note (Signed)
She continues to have ongoing low back pain.  Sometimes she has pain that goes down into her legs.  Chronic pain management helps assist with this with her ongoing use of methadone as needed.

## 2019-12-30 NOTE — Assessment & Plan Note (Signed)
Reviewed home safely.  Has had no falls since her last visit.  Educated on the side effects of the medications that she takes him to make sure that she moves slowly and avoids having any falls when she is getting up from a position such as laying down to standing.

## 2019-12-30 NOTE — Patient Instructions (Addendum)
I appreciate the opportunity to provide you with care for your health and wellness. Today we discussed: pain management  Follow up: 11-12 weeks pain management  No labs or referrals today  Please continue to practice social distancing to keep you, your family, and our community safe.  If you must go out, please wear a mask and practice good handwashing.  It was a pleasure to see you and I look forward to continuing to work together on your health and well-being. Please do not hesitate to call the office if you need care or have questions about your care.  Have a wonderful day and week. With Gratitude, Cherly Beach, DNP, AGNP-BC

## 2020-01-01 ENCOUNTER — Telehealth: Payer: Self-pay

## 2020-01-01 NOTE — Telephone Encounter (Signed)
Pt is calling stating --that she needs her 10 day prednisone called in --it was missed.Marland KitchenMarland Kitchen

## 2020-01-02 ENCOUNTER — Other Ambulatory Visit: Payer: Self-pay

## 2020-01-02 MED ORDER — PREDNISONE 10 MG PO TABS: ORAL_TABLET | ORAL | 0 refills | Status: DC

## 2020-01-02 NOTE — Telephone Encounter (Signed)
refilled 

## 2020-01-02 NOTE — Telephone Encounter (Signed)
Asking Jarrett Soho if this needs to be sent

## 2020-01-14 ENCOUNTER — Other Ambulatory Visit: Payer: Self-pay | Admitting: Family Medicine

## 2020-01-14 DIAGNOSIS — N184 Chronic kidney disease, stage 4 (severe): Secondary | ICD-10-CM

## 2020-01-20 ENCOUNTER — Encounter: Payer: Medicare Other | Admitting: Family Medicine

## 2020-01-26 ENCOUNTER — Telehealth (INDEPENDENT_AMBULATORY_CARE_PROVIDER_SITE_OTHER): Payer: Medicare Other

## 2020-01-26 ENCOUNTER — Other Ambulatory Visit: Payer: Self-pay

## 2020-01-26 VITALS — BP 123/71 | Ht 62.0 in | Wt 178.0 lb

## 2020-01-26 DIAGNOSIS — Z0000 Encounter for general adult medical examination without abnormal findings: Secondary | ICD-10-CM

## 2020-01-26 NOTE — Patient Instructions (Signed)
Katelyn Rowland , Thank you for taking time to come for your Medicare Wellness Visit. I appreciate your ongoing commitment to your health goals. Please review the following plan we discussed and let me know if I can assist you in the future.   Screening recommendations/referrals: Colonoscopy: no longer recommended Mammogram: no longer recommended Bone Density: no longer recommended Recommended yearly ophthalmology/optometry visit for glaucoma screening and checkup Recommended yearly dental visit for hygiene and checkup  Vaccinations: Influenza vaccine: declined Pneumococcal vaccine: declined Tdap vaccine: declined Shingles vaccine: declined   Advanced directives: has POA on file     Next appointment: wellness in 1 year    Preventive Care 65 Years and Older, Female Preventive care refers to lifestyle choices and visits with your health care provider that can promote health and wellness. What does preventive care include?  A yearly physical exam. This is also called an annual well check.  Dental exams once or twice a year.  Routine eye exams. Ask your health care provider how often you should have your eyes checked.  Personal lifestyle choices, including:  Daily care of your teeth and gums.  Regular physical activity.  Eating a healthy diet.  Avoiding tobacco and drug use.  Limiting alcohol use.  Practicing safe sex.  Taking low-dose aspirin every day.  Taking vitamin and mineral supplements as recommended by your health care provider. What happens during an annual well check? The services and screenings done by your health care provider during your annual well check will depend on your age, overall health, lifestyle risk factors, and family history of disease. Counseling  Your health care provider may ask you questions about your:  Alcohol use.  Tobacco use.  Drug use.  Emotional well-being.  Home and relationship well-being.  Sexual activity.  Eating  habits.  History of falls.  Memory and ability to understand (cognition).  Work and work Statistician.  Reproductive health. Screening  You may have the following tests or measurements:  Height, weight, and BMI.  Blood pressure.  Lipid and cholesterol levels. These may be checked every 5 years, or more frequently if you are over 25 years old.  Skin check.  Lung cancer screening. You may have this screening every year starting at age 17 if you have a 30-pack-year history of smoking and currently smoke or have quit within the past 15 years.  Fecal occult blood test (FOBT) of the stool. You may have this test every year starting at age 27.  Flexible sigmoidoscopy or colonoscopy. You may have a sigmoidoscopy every 5 years or a colonoscopy every 10 years starting at age 32.  Hepatitis C blood test.  Hepatitis B blood test.  Sexually transmitted disease (STD) testing.  Diabetes screening. This is done by checking your blood sugar (glucose) after you have not eaten for a while (fasting). You may have this done every 1-3 years.  Bone density scan. This is done to screen for osteoporosis. You may have this done starting at age 66.  Mammogram. This may be done every 1-2 years. Talk to your health care provider about how often you should have regular mammograms. Talk with your health care provider about your test results, treatment options, and if necessary, the need for more tests. Vaccines  Your health care provider may recommend certain vaccines, such as:  Influenza vaccine. This is recommended every year.  Tetanus, diphtheria, and acellular pertussis (Tdap, Td) vaccine. You may need a Td booster every 10 years.  Zoster vaccine. You may need  this after age 44.  Pneumococcal 13-valent conjugate (PCV13) vaccine. One dose is recommended after age 69.  Pneumococcal polysaccharide (PPSV23) vaccine. One dose is recommended after age 74. Talk to your health care provider about which  screenings and vaccines you need and how often you need them. This information is not intended to replace advice given to you by your health care provider. Make sure you discuss any questions you have with your health care provider. Document Released: 08/20/2015 Document Revised: 04/12/2016 Document Reviewed: 05/25/2015 Elsevier Interactive Patient Education  2017 [place] Prevention in the Home Falls can cause injuries. They can happen to people of all ages. There are many things you can do to make your home safe and to help prevent falls. What can I do on the outside of my home?  Regularly fix the edges of walkways and driveways and fix any cracks.  Remove anything that might make you trip as you walk through a door, such as a raised step or threshold.  Trim any bushes or trees on the path to your home.  Use bright outdoor lighting.  Clear any walking paths of anything that might make someone trip, such as rocks or tools.  Regularly check to see if handrails are loose or broken. Make sure that both sides of any steps have handrails.  Any raised decks and porches should have guardrails on the edges.  Have any leaves, snow, or ice cleared regularly.  Use sand or salt on walking paths during winter.  Clean up any spills in your garage right away. This includes oil or grease spills. What can I do in the bathroom?  Use night lights.  Install grab bars by the toilet and in the tub and shower. Do not use towel bars as grab bars.  Use non-skid mats or decals in the tub or shower.  If you need to sit down in the shower, use a plastic, non-slip stool.  Keep the floor dry. Clean up any water that spills on the floor as soon as it happens.  Remove soap buildup in the tub or shower regularly.  Attach bath mats securely with double-sided non-slip rug tape.  Do not have throw rugs and other things on the floor that can make you trip. What can I do in the bedroom?  Use  night lights.  Make sure that you have a light by your bed that is easy to reach.  Do not use any sheets or blankets that are too big for your bed. They should not hang down onto the floor.  Have a firm chair that has side arms. You can use this for support while you get dressed.  Do not have throw rugs and other things on the floor that can make you trip. What can I do in the kitchen?  Clean up any spills right away.  Avoid walking on wet floors.  Keep items that you use a lot in easy-to-reach places.  If you need to reach something above you, use a strong step stool that has a grab bar.  Keep electrical cords out of the way.  Do not use floor polish or wax that makes floors slippery. If you must use wax, use non-skid floor wax.  Do not have throw rugs and other things on the floor that can make you trip. What can I do with my stairs?  Do not leave any items on the stairs.  Make sure that there are handrails on both sides of  the stairs and use them. Fix handrails that are broken or loose. Make sure that handrails are as long as the stairways.  Check any carpeting to make sure that it is firmly attached to the stairs. Fix any carpet that is loose or worn.  Avoid having throw rugs at the top or bottom of the stairs. If you do have throw rugs, attach them to the floor with carpet tape.  Make sure that you have a light switch at the top of the stairs and the bottom of the stairs. If you do not have them, ask someone to add them for you. What else can I do to help prevent falls?  Wear shoes that:  Do not have high heels.  Have rubber bottoms.  Are comfortable and fit you well.  Are closed at the toe. Do not wear sandals.  If you use a stepladder:  Make sure that it is fully opened. Do not climb a closed stepladder.  Make sure that both sides of the stepladder are locked into place.  Ask someone to hold it for you, if possible.  Clearly mark and make sure that you  can see:  Any grab bars or handrails.  First and last steps.  Where the edge of each step is.  Use tools that help you move around (mobility aids) if they are needed. These include:  Canes.  Walkers.  Scooters.  Crutches.  Turn on the lights when you go into a dark area. Replace any light bulbs as soon as they burn out.  Set up your furniture so you have a clear path. Avoid moving your furniture around.  If any of your floors are uneven, fix them.  If there are any pets around you, be aware of where they are.  Review your medicines with your doctor. Some medicines can make you feel dizzy. This can increase your chance of falling. Ask your doctor what other things that you can do to help prevent falls. This information is not intended to replace advice given to you by your health care provider. Make sure you discuss any questions you have with your health care provider. Document Released: 05/20/2009 Document Revised: 12/30/2015 Document Reviewed: 08/28/2014 Elsevier Interactive Patient Education  2017 [employer].

## 2020-01-26 NOTE — Progress Notes (Signed)
Subjective:   Katelyn Rowland is a 79 y.o. female who presents for Medicare Annual (Subsequent) preventive examination.  Review of Systems     Cardiac Risk Factors include: advanced age (>27men, >45 women);dyslipidemia;hypertension;sedentary lifestyle;smoking/ tobacco exposure;obesity (BMI >30kg/m2)     Objective:    Today's Vitals   01/26/20 1314  BP: 123/71  Weight: 178 lb (80.7 kg)  Height: 5\' 2"  (1.575 m)   Body mass index is 32.56 kg/m.  Advanced Directives 03/02/2019 01/07/2018 03/29/2016 02/04/2015 11/18/2014 10/16/2014 06/13/2012  Does Patient Have a Medical Advance Directive? No Yes No No Yes No Patient has advance directive, copy not in chart  Type of Advance Directive - Out of facility DNR (pink MOST or yellow form);Living will;Healthcare Power of [place] - -  Does patient want to make changes to medical advance directive? - - - No - Patient declined - - -  Copy of [place] in Chart? - Yes - No - copy requested No - copy requested - -  Would patient like information on creating a medical advance directive? No - Patient declined - No - patient declined information - - No - patient declined information -  Pre-existing out of facility DNR order (yellow form or pink MOST form) - Yellow form placed in chart (order not valid for inpatient use) - - - - -    Current Medications (verified) Outpatient Encounter Medications as of 01/26/2020  Medication Sig  . amLODipine (NORVASC) 2.5 MG tablet TAKE 1 TABLET BY MOUTH DAILY.  . calcitRIOL (ROCALTROL) 0.25 MCG capsule TAKE (1) CAPSULE BY MOUTH THREE TIMES WEEKLY.  . diphenhydrAMINE (BENADRYL) 25 mg capsule Take 25 mg by mouth every 8 (eight) hours as needed for allergies.  Marland Kitchen esomeprazole (NEXIUM) 40 MG capsule TAKE ONE CAPSULE BY MOUTH DAILY BEFORE BREAKFAST.  Marland Kitchen levothyroxine (SYNTHROID) 100 MCG tablet TAKE 1 TABLET BY MOUTH ONCE A DAY.  Marland Kitchen lisinopril (ZESTRIL)  2.5 MG tablet TAKE 1 TABLET BY MOUTH ONCE A DAY.  . methadone (DOLOPHINE) 10 MG tablet Take one tablet by mouth three times daily for chronic pain  . polyethylene glycol powder (GLYCOLAX/MIRALAX) powder MIX 1 CAPFUL (17 GRAMS) WITH 8OZ OF WATER OR JUICE DAILY.  Marland Kitchen predniSONE (DELTASONE) 10 MG tablet Take one tablet by mouth once daily, as needed, for pain  . rosuvastatin (CRESTOR) 20 MG tablet Take one tablet by mouth  Two times weekly, every Monday and Thursday only  . tiZANidine (ZANAFLEX) 4 MG tablet TAKE 1 TABLET BY MOUTH 2 TIMES DAILY.  . traZODone (DESYREL) 100 MG tablet TAKE 1 TABLET BY MOUTH AT BEDTIME.   No facility-administered encounter medications on file as of 01/26/2020.    Allergies (verified) Prednisone, Acetaminophen, Other, Sulfur, Warfarin sodium, and Cymbalta [duloxetine hcl]   History: Past Medical History:  Diagnosis Date  . Anemia   . Anxiety   . Arthritis   . Chronic kidney disease   . Depression   . Diabetes mellitus 2009   no meds  . 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 Edema of both legs   . GERD (gastroesophageal reflux disease)   . Headache(784.0)   . Hematoma of leg    left   . High blood pressure   . Hypertension    margaret simpson  [id number]  . Muscle cramps   . Neck pain   . PONV (postoperative nausea and  vomiting)   . Reflux   . Thyroid disorder    Past Surgical History:  Procedure Laterality Date  . ABDOMINAL HYSTERECTOMY    . APPENDECTOMY    . BIOPSY N/A 11/20/2014   Procedure: BIOPSY;  Surgeon: Rogene Houston, MD;  Location: [facility];  Service: Endoscopy;  Laterality: N/A;  . CERVICAL SPINE SURGERY    . ESOPHAGEAL DILATION N/A 11/20/2014   Procedure: ESOPHAGEAL DILATION Accident;  Surgeon: Rogene Houston, MD;  Location: [facility];  Service: Endoscopy;  Laterality: N/A;  . ESOPHAGOGASTRODUODENOSCOPY (EGD) WITH PROPOFOL N/A 11/20/2014   Procedure: ESOPHAGOGASTRODUODENOSCOPY  (EGD) WITH PROPOFOL;  Surgeon: Rogene Houston, MD;  Location: [facility];  Service: Endoscopy;  Laterality: N/A;  . FOOT NEUROMA SURGERY     right   . ivc filter placement, following recurrent left DVT  07/2009  . NASAL SINUS SURGERY    . THORACIC DISCECTOMY  06/12/2012   Procedure: THORACIC DISCECTOMY;  Surgeon: Floyce Stakes, MD;  Location: [facility];  Service: Neurosurgery;  Laterality: N/A;  Thoracic Ten-Eleven Thoracic Laminectomy  . TONSILLECTOMY    . TONSILLECTOMY AND ADENOIDECTOMY    . unilateal oopherectomy    . VESICOVAGINAL FISTULA CLOSURE W/ TAH     Family History  Problem Relation Age of Onset  . Emphysema Sister   . Kidney disease Sister   . Diabetes Mother   . Diabetes Father   . Diabetes Brother   . Arthritis Other   . Kidney disease Other    Social History   Socioeconomic History  . Marital status: Widowed    Spouse name: Not on file  . Number of children: 4  . Years of education: 25  . Highest education level: Not on file  Occupational History  . Occupation: disabled     Employer: RETIRED  Tobacco Use  . Smoking status: Former Smoker    Packs/day: 1.00    Years: 42.00    Pack years: 42.00    Types: Cigarettes    Quit date: 06/04/2010    Years since quitting: 9.6  . Smokeless tobacco: Never Used  Vaping Use  . Vaping Use: Never used  Substance and Sexual Activity  . Alcohol use: No  . Drug use: No  . Sexual activity: Never    Birth control/protection: Surgical  Other Topics Concern  . Not on file  Social History Narrative  . Not on file   Social Determinants of Health   Financial Resource Strain: Low Risk   . Difficulty of Paying Living Expenses: Not hard at all  Food Insecurity:   . Worried About Charity fundraiser in the Last Year:   . Arboriculturist in the Last Year:   Transportation Needs: No Transportation Needs  . Lack of Transportation (Medical): No  . Lack of Transportation (Non-Medical): No  Physical Activity: Inactive  .  Days of Exercise per Week: 0 days  . Minutes of Exercise per Session: 0 min  Stress: No Stress Concern Present  . Feeling of Stress : Only a little  Social Connections: Socially Isolated  . Frequency of Communication with Friends and Family: Three times a week  . Frequency of Social Gatherings with Friends and Family: Twice a week  . Attends Religious Services: Never  . Active Member of Clubs or Organizations: No  . Attends Archivist Meetings: Never  . Marital Status: Widowed    Tobacco Counseling Counseling given: Not Answered   Clinical Intake:  Pre-visit preparation completed: Yes  Pain : No/denies pain     Nutritional Status: BMI > 30  Obese Diabetes: No  How often do you need to have someone help you when you read instructions, pamphlets, or other written materials from your doctor or pharmacy?: 2 - Rarely What is the last grade level you completed in school?: 12th grade  Diabetic? no  Interpreter Needed?: No  Information entered by :: Meta Kroenke lpn   Activities of Daily Living In your present state of health, do you have any difficulty performing the following activities: 01/26/2020  Hearing? N  Vision? N  Difficulty concentrating or making decisions? N  Walking or climbing stairs? Y  Dressing or bathing? N  Doing errands, shopping? N  Preparing Food and eating ? N  Using the Toilet? N  In the past six months, have you accidently leaked urine? N  Do you have problems with loss of bowel control? N  Managing your Medications? N  Managing your Finances? N  Housekeeping or managing your Housekeeping? Y  Comment family helps out  Some recent data might be hidden    Patient Care Team: Fayrene Helper, MD as PCP - General  Indicate any recent Medical Services you may have received from other than [facility] providers in the past year (date may be approximate).     Assessment:   This is a routine wellness examination for Katelyn Rowland.  Hearing/Vision  screen No exam data present  Dietary issues and exercise activities discussed: Current Exercise Habits: The patient does not participate in regular exercise at present, Exercise limited by: orthopedic condition(s)  Goals    . DIET - EAT MORE FRUITS AND VEGETABLES    . Exercise 3x per week (30 min per time)     Try to walk around for 15-30 mins a couple times a week as tolerated     . Prevent falls      Depression Screen PHQ 2/9 Scores 01/26/2020 12/30/2019 09/29/2019 04/02/2019 03/06/2019 01/15/2019 12/24/2018  PHQ - 2 Score 0 0 0 0 1 0 0  PHQ- 9 Score - 0 - - - - -    Fall Risk Fall Risk  12/30/2019 09/29/2019 06/26/2019 04/02/2019 03/06/2019  Falls in the past year? 0 0 0 0 0  Number falls in past yr: 0 0 0 0 -  Injury with Fall? 0 0 0 0 0  Risk Factor Category  - - - - -  Risk for fall due to : No Fall Risks - - - -  Follow up Falls evaluation completed - - - -    Any stairs in or around the home? No  If so, are there any without handrails? No  Home free of loose throw rugs in walkways, pet beds, electrical cords, etc? Yes  Adequate lighting in your home to reduce risk of falls? Yes   ASSISTIVE DEVICES UTILIZED TO PREVENT FALLS:  Life alert? No called about it but states it was too expensive  Use of a cane, walker or w/c? Yes  uses walker as needed Grab bars in the bathroom? No  Shower chair or bench in shower? No  Elevated toilet seat or a handicapped toilet? No   TIMED UP AND GO:  Was the test performed? No .  Length of time to ambulate 10 feet:  sec.   Was performed virtually   Cognitive Function: MMSE - Mini Mental State Exam 12/10/2015  Orientation to time 5  Orientation to Place 5  Registration 3  Attention/ Calculation 5  Recall 3  Language- name 2 objects 2  Language- repeat 1  Language- follow 3 step command 3  Language- read & follow direction 1  Write a sentence 1  Copy design 0  Total score 29     6CIT Screen 01/26/2020 01/15/2019 01/07/2018  What Year?  0 points 0 points 0 points  What month? 0 points 0 points 0 points  What time? 0 points 0 points 0 points  Count back from 20 0 points 0 points 0 points  Months in reverse 0 points 0 points 0 points  Repeat phrase 2 points 0 points 0 points  Total Score 2 0 0    Immunizations Immunization History  Administered Date(s) Administered  . Pneumococcal Polysaccharide-23 03/26/2006  . Td 12/30/2003    TDAP status: Due, Education has been provided regarding the importance of this vaccine. Advised may receive this vaccine at local pharmacy or Health Dept. Aware to provide a copy of the vaccination record if obtained from local pharmacy or Health Dept. Verbalized acceptance and understanding. Flu Vaccine status: Declined, Education has been provided regarding the importance of this vaccine but patient still declined. Advised may receive this vaccine at local pharmacy or Health Dept. Aware to provide a copy of the vaccination record if obtained from local pharmacy or Health Dept. Verbalized acceptance and understanding. Pneumococcal vaccine status: Declined,  Education has been provided regarding the importance of this vaccine but patient still declined. Advised may receive this vaccine at local pharmacy or Health Dept. Aware to provide a copy of the vaccination record if obtained from local pharmacy or Health Dept. Verbalized acceptance and understanding.  Covid-19 vaccine status: Declined, Education has been provided regarding the importance of this vaccine but patient still declined. Advised may receive this vaccine at local pharmacy or Health Dept.or vaccine clinic. Aware to provide a copy of the vaccination record if obtained from local pharmacy or Health Dept. Verbalized acceptance and understanding.  Qualifies for Shingles Vaccine? Yes   Zostavax completed No   Shingrix Completed?: No.    Education has been provided regarding the importance of this vaccine. Patient has been advised to call  insurance company to determine out of pocket expense if they have not yet received this vaccine. Advised may also receive vaccine at local pharmacy or Health Dept. Verbalized acceptance and understanding.  Screening Tests Health Maintenance  Topic Date Due  . TETANUS/TDAP  06/25/2020 (Originally 12/29/2013)  . PNA vac Low Risk Adult (2 of 2 - PCV13) 06/25/2020 (Originally 03/27/2007)  . COVID-19 Vaccine (1) 02/10/2021 (Originally 01/22/1953)  . INFLUENZA VACCINE  03/07/2020  . DEXA SCAN  Completed    Health Maintenance  There are no preventive care reminders to display for this patient.  Colorectal cancer screening: No longer required.  Mammogram status: No longer required.  Bone Density status: Completed no longer recommended. Results reflect: Bone density results: OSTEOPOROSIS. Repeat every 5 years. patient was told no longer required  Lung Cancer Screening: (Low Dose CT Chest recommended if Age 40-80 years, 30 pack-year currently smoking OR have quit w/in 15years.) does not qualify.   Lung Cancer Screening Referral: does not qualify  Additional Screening:  Hepatitis C Screening: does qualify;   Vision Screening: Recommended annual ophthalmology exams for early detection of glaucoma and other disorders of the eye. Is the patient up to date with their annual eye exam?  No states she will schedule when she is feeling better Who is the provider  or what is the name of the office in which the patient attends annual eye exams?  If pt is not established with a provider, would they like to be referred to a provider to establish care? No .   Dental Screening: Recommended annual dental exams for proper oral hygiene  Community Resource Referral / Chronic Care Management: CRR required this visit?  No   CCM required this visit?  No      Plan:     I have personally reviewed and noted the following in the patient's chart:   . Medical and social history . Use of alcohol, tobacco or  illicit drugs  . Current medications and supplements . Functional ability and status . Nutritional status . Physical activity . Advanced directives . List of other physicians . Hospitalizations, surgeries, and ER visits in previous 12 months . Vitals . Screenings to include cognitive, depression, and falls . Referrals and appointments  In addition, I have reviewed and discussed with patient certain preventive protocols, quality metrics, and best practice recommendations. A written personalized care plan for preventive services as well as general preventive health recommendations were provided to patient.     Kate Sable, LPN, LPN   [id number]/63/[id number]

## 2020-02-10 ENCOUNTER — Telehealth: Payer: Self-pay | Admitting: Family Medicine

## 2020-02-10 ENCOUNTER — Telehealth: Payer: Self-pay

## 2020-02-10 NOTE — Telephone Encounter (Signed)
Has been out of Pain Medication for 4 days..,. Please advise and call the patient

## 2020-02-10 NOTE — Telephone Encounter (Signed)
Patient left a voicemail wanting a call back about a prescription refill request at [phone]

## 2020-02-10 NOTE — Telephone Encounter (Signed)
LVM for pt to call the office.

## 2020-02-11 ENCOUNTER — Other Ambulatory Visit: Payer: Self-pay | Admitting: Family Medicine

## 2020-02-11 MED ORDER — METHADONE HCL 10 MG PO TABS: ORAL_TABLET | ORAL | 0 refills | Status: DC

## 2020-02-11 NOTE — Telephone Encounter (Signed)
Please advise 

## 2020-02-11 NOTE — Telephone Encounter (Signed)
Pls advise medication is prescribed for 1 month, please call beginning of August or last week in July for her August medication . I tried sending it but was unsuccessful Sorry she has been out of med

## 2020-02-11 NOTE — Telephone Encounter (Signed)
Left message letting patient know medication was sent in. And to call end of July for the next months medication.

## 2020-03-08 ENCOUNTER — Telehealth: Payer: Self-pay | Admitting: Family Medicine

## 2020-03-08 NOTE — Telephone Encounter (Signed)
Please move pt appt up to 8-5 there is plenty availability

## 2020-03-08 NOTE — Telephone Encounter (Signed)
Methadone will run out on 03/12/20 pt is scheduled to come in on 03/16/20

## 2020-03-09 ENCOUNTER — Other Ambulatory Visit: Payer: Self-pay | Admitting: Family Medicine

## 2020-03-11 ENCOUNTER — Encounter: Payer: Self-pay | Admitting: Family Medicine

## 2020-03-11 ENCOUNTER — Other Ambulatory Visit: Payer: Self-pay

## 2020-03-11 ENCOUNTER — Ambulatory Visit (INDEPENDENT_AMBULATORY_CARE_PROVIDER_SITE_OTHER): Payer: Medicare Other | Admitting: Family Medicine

## 2020-03-11 VITALS — BP 138/81 | HR 76 | Resp 16 | Ht 62.0 in | Wt 183.0 lb

## 2020-03-11 DIAGNOSIS — E669 Obesity, unspecified: Secondary | ICD-10-CM | POA: Diagnosis not present

## 2020-03-11 DIAGNOSIS — E038 Other specified hypothyroidism: Secondary | ICD-10-CM | POA: Diagnosis not present

## 2020-03-11 DIAGNOSIS — G4700 Insomnia, unspecified: Secondary | ICD-10-CM

## 2020-03-11 DIAGNOSIS — E785 Hyperlipidemia, unspecified: Secondary | ICD-10-CM

## 2020-03-11 DIAGNOSIS — I10 Essential (primary) hypertension: Secondary | ICD-10-CM

## 2020-03-11 DIAGNOSIS — G8929 Other chronic pain: Secondary | ICD-10-CM | POA: Diagnosis not present

## 2020-03-11 DIAGNOSIS — E66811 Obesity, class 1: Secondary | ICD-10-CM

## 2020-03-11 MED ORDER — AMLODIPINE BESYLATE 2.5 MG PO TABS: 2.5000 mg | ORAL_TABLET | Freq: Every day | ORAL | 5 refills | Status: DC

## 2020-03-11 MED ORDER — METHADONE HCL 10 MG PO TABS: ORAL_TABLET | ORAL | 0 refills | Status: AC

## 2020-03-11 MED ORDER — TIZANIDINE HCL 4 MG PO TABS: 4.0000 mg | ORAL_TABLET | Freq: Two times a day (BID) | ORAL | 5 refills | Status: DC

## 2020-03-11 MED ORDER — ESOMEPRAZOLE MAGNESIUM 40 MG PO CPDR: DELAYED_RELEASE_CAPSULE | ORAL | 5 refills | Status: DC

## 2020-03-11 MED ORDER — TRAZODONE HCL 100 MG PO TABS
100.0000 mg | ORAL_TABLET | Freq: Every day | ORAL | 5 refills | Status: DC
Start: 2020-03-11 — End: 2020-08-10

## 2020-03-11 MED ORDER — PREDNISONE 10 MG PO TABS: ORAL_TABLET | ORAL | 0 refills | Status: DC

## 2020-03-11 MED ORDER — LEVOTHYROXINE SODIUM 100 MCG PO TABS: 100.0000 ug | ORAL_TABLET | Freq: Every day | ORAL | 5 refills | Status: DC

## 2020-03-11 NOTE — Patient Instructions (Signed)
F/iu with MD in 12 weeks, call if you need me before  Please get fasting lipid, cmp and eGFR, TSH 5 days before next visit.  No changes in your medication  Please be careful not to fall  Thanks for choosing [facility], we consider it a privelige to serve you. Think about what you will eat, plan ahead. Choose " clean, green, fresh or frozen" over canned, processed or packaged foods which are more sugary, salty and fatty. 70 to 75% of food eaten should be vegetables and fruit. Three meals at set times with snacks allowed between meals, but they must be fruit or vegetables. Aim to eat over a 12 hour period , example 7 am to 7 pm, and STOP after  your last meal of the day. Drink water,generally about 64 ounces per day, no other drink is as healthy. Fruit juice is best enjoyed in a healthy way, by EATING the fruit.

## 2020-03-13 MED ORDER — METHADONE HCL 10 MG PO TABS: ORAL_TABLET | ORAL | 0 refills | Status: AC

## 2020-03-13 NOTE — Assessment & Plan Note (Signed)
Controlled, no change in medication  

## 2020-03-13 NOTE — Assessment & Plan Note (Signed)
Controlled, no change in medication Updated lab needed at/ before next visit.  

## 2020-03-13 NOTE — Assessment & Plan Note (Signed)
The patient's Controlled Substance registry is reviewed and compliance confirmed. Adequacy of  Pain control and level of function is assessed. Medication dosing is adjusted as deemed appropriate. Twelve weeks of medication is prescribed , with a follow up appointment between 11 to 12 weeks .  

## 2020-03-13 NOTE — Assessment & Plan Note (Signed)
Sleep hygiene reviewed and written information offered also. Prescription sent for  medication needed.  

## 2020-03-13 NOTE — Assessment & Plan Note (Signed)
  Patient re-educated about  the importance of commitment to a  minimum of 150 minutes of exercise per week as able.  The importance of healthy food choices with portion control discussed, as well as eating regularly and within a 12 hour window most days. The need to choose "clean , green" food 50 to 75% of the time is discussed, as well as to make water the primary drink and set a goal of 64 ounces water daily.    Weight /BMI 03/11/2020 01/26/2020 12/30/2019  WEIGHT 183 lb 0.6 oz 178 lb 178 lb  HEIGHT 5\' 2"  5\' 2"  5\' 2"   BMI 33.48 kg/m2 32.56 kg/m2 32.56 kg/m2

## 2020-03-13 NOTE — Progress Notes (Signed)
   Katelyn Rowland     MRN: [id number]      DOB: 1941/02/05   HPI Katelyn Rowland is here for follow up and re-evaluation of chronic medical conditions, medication management and review of any available recent lab and radiology data.  Preventive health is updated, specifically  Cancer screening and Immunization.   . The PT denies any adverse reactions to current medications since the last visit.  There are no new concerns.    ROS Denies recent fever or chills. Denies sinus pressure, nasal congestion, ear pain or sore throat. Denies chest congestion, productive cough or wheezing. Denies chest pains, palpitations and leg swelling Denies abdominal pain, nausea, vomiting,diarrhea or constipation.   Denies dysuria, frequency, hesitancy or incontinence. C/o chronic  joint pain, swelling and limitation in mobility. Denies headaches, seizures, numbness, or tingling. Denies depression, anxiety or insomnia. Denies skin break down or rash.   PE  BP 138/81   Pulse 76   Resp 16   Ht 5\' 2"  (1.575 m)   Wt 183 lb 0.6 oz (83 kg)   SpO2 93%   BMI 33.48 kg/m   Patient alert and oriented and in no cardiopulmonary distress.  HEENT: No facial asymmetry, EOMI,     Neck supple .  Chest: Clear to auscultation bilaterally.  CVS: S1, S2 no murmurs, no S3.Regular rate.  ABD: Soft non tender.   Ext: No edema  UR:[id number]  ROM spine, shoulders, hips and knees.  Skin: Intact, no ulcerations or rash noted.  Psych: Good eye contact, normal affect. Memory intact not anxious or depressed appearing.  CNS: CN 2-12 intact, power,  normal throughout.no focal deficits noted.   Assessment & Plan  Essential hypertension Controlled, no change in medication   Hyperlipidemia LDL goal <100 Hyperlipidemia:Low fat diet discussed and encouraged.   Lipid Panel  Lab Results  Component Value Date   CHOL 139 09/29/2019   HDL 54 09/29/2019   LDLCALC 63 09/29/2019   TRIG 135 09/29/2019   CHOLHDL 2.6  09/29/2019     Controlled, no change in medication Updated lab needed at/ before next visit.   Hypothyroidism Controlled, no change in medication Updated lab needed at/ before next visit.   Obesity (BMI 30.0-34.9)  Patient re-educated about  the importance of commitment to a  minimum of 150 minutes of exercise per week as able.  The importance of healthy food choices with portion control discussed, as well as eating regularly and within a 12 hour window most days. The need to choose "clean , green" food 50 to 75% of the time is discussed, as well as to make water the primary drink and set a goal of 64 ounces water daily.    Weight /BMI 03/11/2020 01/26/2020 12/30/2019  WEIGHT 183 lb 0.6 oz 178 lb 178 lb  HEIGHT 5\' 2"  5\' 2"  5\' 2"   BMI 33.48 kg/m2 32.56 kg/m2 32.56 kg/m2      INSOMNIA, CHRONIC Sleep hygiene reviewed and written information offered also. Prescription sent for  medication needed.   Encounter for chronic pain management The patient's Controlled Substance registry is reviewed and compliance confirmed. Adequacy of  Pain control and level of function is assessed. Medication dosing is adjusted as deemed appropriate. Twelve weeks of medication is prescribed , with a follow up appointment between 11 to 12 weeks .

## 2020-03-13 NOTE — Assessment & Plan Note (Signed)
Hyperlipidemia:Low fat diet discussed and encouraged.   Lipid Panel  Lab Results  Component Value Date   CHOL 139 09/29/2019   HDL 54 09/29/2019   LDLCALC 63 09/29/2019   TRIG 135 09/29/2019   CHOLHDL 2.6 09/29/2019     Controlled, no change in medication Updated lab needed at/ before next visit.

## 2020-03-16 ENCOUNTER — Ambulatory Visit: Payer: Medicare Other | Admitting: Family Medicine

## 2020-04-26 ENCOUNTER — Other Ambulatory Visit: Payer: Self-pay | Admitting: Family Medicine

## 2020-05-05 ENCOUNTER — Other Ambulatory Visit: Payer: Self-pay | Admitting: Family Medicine

## 2020-05-05 DIAGNOSIS — N184 Chronic kidney disease, stage 4 (severe): Secondary | ICD-10-CM

## 2020-05-11 ENCOUNTER — Other Ambulatory Visit: Payer: Self-pay | Admitting: Family Medicine

## 2020-05-19 DIAGNOSIS — E785 Hyperlipidemia, unspecified: Secondary | ICD-10-CM | POA: Diagnosis not present

## 2020-05-19 DIAGNOSIS — I10 Essential (primary) hypertension: Secondary | ICD-10-CM | POA: Diagnosis not present

## 2020-05-20 ENCOUNTER — Other Ambulatory Visit: Payer: Self-pay | Admitting: Family Medicine

## 2020-05-20 LAB — CMP14+EGFR
ALT: 11 IU/L (ref 0–32)
AST: 12 IU/L (ref 0–40)
Albumin/Globulin Ratio: 1.7 (ref 1.2–2.2)
Albumin: 4.5 g/dL (ref 3.7–4.7)
Alkaline Phosphatase: 62 IU/L (ref 44–121)
BUN/Creatinine Ratio: 12 (ref 12–28)
BUN: 28 mg/dL — ABNORMAL HIGH (ref 8–27)
Bilirubin Total: 0.3 mg/dL (ref 0.0–1.2)
CO2: 24 mmol/L (ref 20–29)
Calcium: 9.4 mg/dL (ref 8.7–10.3)
Chloride: 100 mmol/L (ref 96–106)
Creatinine, Ser: 2.29 mg/dL — ABNORMAL HIGH (ref 0.57–1.00)
GFR calc Af Amer: 23 mL/min/{1.73_m2} — ABNORMAL LOW (ref >59)
GFR calc non Af Amer: 20 mL/min/{1.73_m2} — ABNORMAL LOW (ref >59)
Globulin, Total: 2.6 g/dL (ref 1.5–4.5)
Glucose: 93 mg/dL (ref 65–99)
Potassium: 4.1 mmol/L (ref 3.5–5.2)
Sodium: 136 mmol/L (ref 134–144)
Total Protein: 7.1 g/dL (ref 6.0–8.5)

## 2020-05-20 LAB — LIPID PANEL
Chol/HDL Ratio: 3.2 ratio (ref 0.0–4.4)
Cholesterol, Total: 165 mg/dL (ref 100–199)
HDL: 51 mg/dL (ref >39)
LDL Chol Calc (NIH): 81 mg/dL (ref 0–99)
Triglycerides: 199 mg/dL — ABNORMAL HIGH (ref 0–149)
VLDL Cholesterol Cal: 33 mg/dL (ref 5–40)

## 2020-05-20 LAB — TSH: TSH: 6.91 u[IU]/mL — ABNORMAL HIGH (ref 0.450–4.500)

## 2020-05-20 MED ORDER — LEVOTHYROXINE SODIUM 112 MCG PO TABS
112.0000 ug | ORAL_TABLET | Freq: Every day | ORAL | 1 refills | Status: DC
Start: 2020-05-20 — End: 2020-05-31

## 2020-05-25 ENCOUNTER — Ambulatory Visit: Payer: Medicare Other | Admitting: Family Medicine

## 2020-05-31 ENCOUNTER — Other Ambulatory Visit: Payer: Self-pay

## 2020-05-31 ENCOUNTER — Ambulatory Visit (INDEPENDENT_AMBULATORY_CARE_PROVIDER_SITE_OTHER): Payer: Medicare Other | Admitting: Family Medicine

## 2020-05-31 ENCOUNTER — Encounter: Payer: Self-pay | Admitting: Family Medicine

## 2020-05-31 VITALS — BP 138/78 | HR 87 | Resp 16 | Ht 62.0 in | Wt 179.0 lb

## 2020-05-31 DIAGNOSIS — Z780 Asymptomatic menopausal state: Secondary | ICD-10-CM | POA: Diagnosis not present

## 2020-05-31 DIAGNOSIS — I10 Essential (primary) hypertension: Secondary | ICD-10-CM

## 2020-05-31 DIAGNOSIS — R7303 Prediabetes: Secondary | ICD-10-CM

## 2020-05-31 DIAGNOSIS — Z9181 History of falling: Secondary | ICD-10-CM | POA: Diagnosis not present

## 2020-05-31 DIAGNOSIS — E785 Hyperlipidemia, unspecified: Secondary | ICD-10-CM

## 2020-05-31 DIAGNOSIS — K222 Esophageal obstruction: Secondary | ICD-10-CM | POA: Diagnosis not present

## 2020-05-31 DIAGNOSIS — G4700 Insomnia, unspecified: Secondary | ICD-10-CM | POA: Diagnosis not present

## 2020-05-31 DIAGNOSIS — E669 Obesity, unspecified: Secondary | ICD-10-CM

## 2020-05-31 DIAGNOSIS — M4726 Other spondylosis with radiculopathy, lumbar region: Secondary | ICD-10-CM | POA: Diagnosis not present

## 2020-05-31 DIAGNOSIS — E038 Other specified hypothyroidism: Secondary | ICD-10-CM

## 2020-05-31 DIAGNOSIS — G8929 Other chronic pain: Secondary | ICD-10-CM

## 2020-05-31 DIAGNOSIS — K219 Gastro-esophageal reflux disease without esophagitis: Secondary | ICD-10-CM | POA: Diagnosis not present

## 2020-05-31 LAB — POCT GLYCOSYLATED HEMOGLOBIN (HGB A1C): Hemoglobin A1C: 5.9 % — AB (ref 4.0–5.6)

## 2020-05-31 MED ORDER — PREDNISONE 10 MG PO TABS
ORAL_TABLET | ORAL | 0 refills | Status: DC
Start: 2020-05-31 — End: 2020-08-30

## 2020-05-31 MED ORDER — LISINOPRIL 2.5 MG PO TABS: 2.5000 mg | ORAL_TABLET | Freq: Every day | ORAL | 11 refills | Status: DC

## 2020-05-31 MED ORDER — METHADONE HCL 10 MG PO TABS: ORAL_TABLET | ORAL | 0 refills | Status: AC

## 2020-05-31 MED ORDER — LEVOTHYROXINE SODIUM 112 MCG PO TABS
ORAL_TABLET | ORAL | 3 refills | Status: DC
Start: 2020-05-31 — End: 2020-09-10

## 2020-05-31 NOTE — Patient Instructions (Addendum)
Follow-up with MD last week in January call if you need me sooner.  Please schedule bone density test at checkout.  New higher dose of L-thyroxine take 1-1/2 tablets every Monday Wednesday Friday and Saturday take 1 tablet every Tuesday Thursday and Sunday.  Please reduce processed foods and cheese and continue to drink at least 64 ounces of water daily for kidney health.  Please get nonfasting Chem-7 and EGFR and TSH 1 week before your follow-up in January   Please keep home environment safe to reduce your risk of falling  Thanks for choosing [facility] , we consider it a privelige to serve you.

## 2020-05-31 NOTE — Progress Notes (Signed)
Katelyn Rowland     MRN: [id number]      DOB: [birth year]   HPI [name] is here for follow up and re-evaluation of chronic medical conditions, medication management and review of any available recent lab and radiology data.  Preventive health is discussed , she refuses cancer screening and immunization. The PT denies any adverse reactions to current medications since the last visit.  There are no new concerns.  There are no specific complaints   ROS Denies recent fever or chills. Denies sinus pressure, nasal congestion, ear pain or sore throat. Denies chest congestion, productive cough or wheezing. Denies chest pains, palpitations and leg swelling Denies abdominal pain, nausea, vomiting,diarrhea or constipation.   Denies dysuria, frequency, hesitancy or incontinence. C/o chronic  joint pain, swelling and limitation in mobility. Denies headaches, seizures, numbness, or tingling. Denies depression, anxiety or insomnia. Denies skin break down or rash.   PE  BP 138/78   Pulse 87   Resp 16   Ht 5\' 2"  (1.575 m)   Wt 179 lb (81.2 kg)   SpO2 93%   BMI 32.74 kg/m   Patient alert and oriented and in no cardiopulmonary distress.  HEENT: No facial asymmetry, EOMI,     Neck supple .  Chest: Clear to auscultation bilaterally.  CVS: S1, S2 no murmurs, no S3.Regular rate.  ABD: Soft non tender.   Ext: No edema  MS: decreased  ROM spine, shoulders, hips and knees.  Skin: Intact, no ulcerations or rash noted.  Psych: Good eye contact, normal affect. Memory intact not anxious or depressed appearing.  CNS: CN 2-12 intact, power,  normal throughout.no focal deficits noted.   Assessment & Plan  Hypothyroidism Not adequately controlled ,. Medication dose adjustment made  Hyperlipidemia LDL goal <100 Hyperlipidemia:Low fat diet discussed and encouraged.   Lipid Panel  Lab Results  Component Value Date   CHOL 165 05/19/2020   HDL 51 05/19/2020   LDLCALC 81 05/19/2020    TRIG 199 (H) 05/19/2020   CHOLHDL 3.2 05/19/2020   Needs to reduce fried and fatty foods   At high risk for falls Home safety and fall risk reduction discussed  Encounter for chronic pain management The patient's Controlled Substance registry is reviewed and compliance confirmed. Adequacy of  Pain control and level of function is assessed. Medication dosing is adjusted as deemed appropriate. Twelve weeks of medication is prescribed , with a follow up appointment between 11 to 12 weeks .   Prediabetes Improving, she is applauded on this Patient educated about the importance of limiting  Carbohydrate intake , the need to commit to daily physical activity for a minimum of 30 minutes , and to commit weight loss. The fact that changes in all these areas will reduce or eliminate all together the development of diabetes is stressed.   Diabetic Labs Latest Ref Rng & Units 05/31/2020 05/19/2020 09/29/2019 03/19/2019 06/18/2018  HbA1c 4.0 - 5.6 % 5.9(A) - 6.0(H) - 5.5  Microalbumin Not Estab. ug/mL - - - - -  Micro/Creat Ratio 0.0 - 30.0 mg/g creat - - - - -  Chol 100 - 199 mg/dL - 165 139 229(H) 116  HDL >39 mg/dL - 51 54 49 45  Calc LDL 0 - 99 mg/dL - 81 63 143(H) 45  Triglycerides 0 - 149 mg/dL - 199(H) 135 187(H) 128  Creatinine 0.57 - 1.00 mg/dL - 2.29(H) 1.93(H) 2.03(H) 1.78(H)   BP/Weight 05/31/2020 03/11/2020 01/26/2020 12/30/2019 09/29/2019 06/26/2019 0/04/3817  Systolic BP  138 138 123 123 671 245 809  Diastolic BP 78 81 71 71 70 78 80  Wt. (Lbs) 179 183.04 178 178 178.12 174.5 177  BMI 32.74 33.48 32.56 32.56 32.58 31.92 32.37   Foot/eye exam completion dates 07/11/2018 04/11/2017  Foot exam Order - -  Foot Form Completion Done Done      Obesity (BMI 30.0-34.9)  Patient re-educated about  the importance of commitment to a  minimum of 150 minutes of exercise per week as able.  The importance of healthy food choices with portion control discussed, as well as eating regularly and  within a 12 hour window most days. The need to choose "clean , green" food 50 to 75% of the time is discussed, as well as to make water the primary drink and set a goal of 64 ounces water daily.    Weight /BMI 05/31/2020 03/11/2020 01/26/2020  WEIGHT 179 lb 183 lb 0.6 oz 178 lb  HEIGHT 5\' 2"  5\' 2"  5\' 2"   BMI 32.74 kg/m2 33.48 kg/m2 32.56 kg/m2      INSOMNIA, CHRONIC Adequately controlled  Sleep hygiene reviewed and written information offered also. Prescription sent for  medication needed.   GERD with stricture Controlled, no change in medication   Essential hypertension Controlled, no change in medication DASH diet and commitment to daily physical activity for a minimum of 30 minutes discussed and encouraged, as a part of hypertension management. The importance of attaining a healthy weight is also discussed.  BP/Weight 05/31/2020 03/11/2020 01/26/2020 12/30/2019 09/29/2019 06/26/2019 9/83/3825  Systolic BP 053 976 734 193 790 240 973  Diastolic BP 78 81 71 71 70 78 80  Wt. (Lbs) 179 183.04 178 178 178.12 174.5 177  BMI 32.74 33.48 32.56 32.56 32.58 31.92 32.37       Other spondylosis with radiculopathy, lumbar region Unchanged and chronic pain management to be continued

## 2020-06-07 ENCOUNTER — Encounter: Payer: Self-pay | Admitting: Family Medicine

## 2020-06-07 NOTE — Assessment & Plan Note (Signed)
The patient's Controlled Substance registry is reviewed and compliance confirmed. Adequacy of  Pain control and level of function is assessed. Medication dosing is adjusted as deemed appropriate. Twelve weeks of medication is prescribed , with a follow up appointment between 11 to 12 weeks .  

## 2020-06-07 NOTE — Assessment & Plan Note (Signed)
Adequately controlled  Sleep hygiene reviewed and written information offered also. Prescription sent for  medication needed.

## 2020-06-07 NOTE — Assessment & Plan Note (Signed)
  Patient re-educated about  the importance of commitment to a  minimum of 150 minutes of exercise per week as able.  The importance of healthy food choices with portion control discussed, as well as eating regularly and within a 12 hour window most days. The need to choose "clean , green" food 50 to 75% of the time is discussed, as well as to make water the primary drink and set a goal of 64 ounces water daily.    Weight /BMI 05/31/2020 03/11/2020 01/26/2020  WEIGHT 179 lb 183 lb 0.6 oz 178 lb  HEIGHT 5\' 2"  5\' 2"  5\' 2"   BMI 32.74 kg/m2 33.48 kg/m2 32.56 kg/m2

## 2020-06-07 NOTE — Assessment & Plan Note (Signed)
Not adequately controlled ,. Medication dose adjustment made

## 2020-06-07 NOTE — Assessment & Plan Note (Signed)
Improving, she is applauded on this Patient educated about the importance of limiting  Carbohydrate intake , the need to commit to daily physical activity for a minimum of 30 minutes , and to commit weight loss. The fact that changes in all these areas will reduce or eliminate all together the development of diabetes is stressed.   Diabetic Labs Latest Ref Rng & Units 05/31/2020 05/19/2020 09/29/2019 03/19/2019 06/18/2018  HbA1c 4.0 - 5.6 % 5.9(A) - 6.0(H) - 5.5  Microalbumin Not Estab. ug/mL - - - - -  Micro/Creat Ratio 0.0 - 30.0 mg/g creat - - - - -  Chol 100 - 199 mg/dL - 165 139 229(H) 116  HDL >39 mg/dL - 51 54 49 45  Calc LDL 0 - 99 mg/dL - 81 63 143(H) 45  Triglycerides 0 - 149 mg/dL - 199(H) 135 187(H) 128  Creatinine 0.57 - 1.00 mg/dL - 2.29(H) 1.93(H) 2.03(H) 1.78(H)   BP/Weight 05/31/2020 03/11/2020 01/26/2020 12/30/2019 09/29/2019 06/26/2019 12/15/209  Systolic BP 173 567 014 103 013 143 888  Diastolic BP 78 81 71 71 70 78 80  Wt. (Lbs) 179 183.04 178 178 178.12 174.5 177  BMI 32.74 33.48 32.56 32.56 32.58 31.92 32.37   Foot/eye exam completion dates 07/11/2018 04/11/2017  Foot exam Order - -  Foot Form Completion Done Done

## 2020-06-07 NOTE — Assessment & Plan Note (Signed)
Controlled, no change in medication  

## 2020-06-07 NOTE — Assessment & Plan Note (Signed)
Home safety and fall risk reduction discussed 

## 2020-06-07 NOTE — Assessment & Plan Note (Signed)
Hyperlipidemia:Low fat diet discussed and encouraged.   Lipid Panel  Lab Results  Component Value Date   CHOL 165 05/19/2020   HDL 51 05/19/2020   LDLCALC 81 05/19/2020   TRIG 199 (H) 05/19/2020   CHOLHDL 3.2 05/19/2020   Needs to reduce fried and fatty foods

## 2020-06-08 ENCOUNTER — Other Ambulatory Visit: Payer: Self-pay | Admitting: Family Medicine

## 2020-06-08 DIAGNOSIS — N184 Chronic kidney disease, stage 4 (severe): Secondary | ICD-10-CM

## 2020-06-08 MED ORDER — METHADONE HCL 10 MG PO TABS
ORAL_TABLET | ORAL | 0 refills | Status: AC
Start: 2020-06-09 — End: 2020-07-09

## 2020-06-08 MED ORDER — METHADONE HCL 10 MG PO TABS
ORAL_TABLET | ORAL | 0 refills | Status: DC
Start: 2020-08-09 — End: 2020-09-06

## 2020-06-08 MED ORDER — METHADONE HCL 10 MG PO TABS
ORAL_TABLET | ORAL | 0 refills | Status: AC
Start: 2020-07-09 — End: 2020-08-09

## 2020-06-08 NOTE — Assessment & Plan Note (Signed)
Controlled, no change in medication DASH diet and commitment to daily physical activity for a minimum of 30 minutes discussed and encouraged, as a part of hypertension management. The importance of attaining a healthy weight is also discussed.  BP/Weight 05/31/2020 03/11/2020 01/26/2020 12/30/2019 09/29/2019 06/26/2019 2/94/2627  Systolic BP 004 849 865 168 610 424 731  Diastolic BP 78 81 71 71 70 78 80  Wt. (Lbs) 179 183.04 178 178 178.12 174.5 177  BMI 32.74 33.48 32.56 32.56 32.58 31.92 32.37

## 2020-06-08 NOTE — Assessment & Plan Note (Signed)
Unchanged and chronic pain management to be continued

## 2020-06-09 ENCOUNTER — Other Ambulatory Visit: Payer: Self-pay | Admitting: Family Medicine

## 2020-06-18 ENCOUNTER — Other Ambulatory Visit (HOSPITAL_COMMUNITY): Payer: Medicare Other

## 2020-06-25 ENCOUNTER — Other Ambulatory Visit: Payer: Self-pay

## 2020-06-25 ENCOUNTER — Ambulatory Visit (HOSPITAL_COMMUNITY)
Admission: RE | Admit: 2020-06-25 | Discharge: 2020-06-25 | Disposition: A | Discharge status: Home / Self Care | Payer: Medicare Other | Source: Ambulatory Visit | Attending: Family Medicine | Admitting: Family Medicine

## 2020-06-25 DIAGNOSIS — Z780 Asymptomatic menopausal state: Secondary | ICD-10-CM | POA: Insufficient documentation

## 2020-06-25 DIAGNOSIS — Z8739 Personal history of other diseases of the musculoskeletal system and connective tissue: Secondary | ICD-10-CM | POA: Diagnosis not present

## 2020-07-07 ENCOUNTER — Other Ambulatory Visit: Payer: Self-pay | Admitting: Family Medicine

## 2020-07-07 DIAGNOSIS — N184 Chronic kidney disease, stage 4 (severe): Secondary | ICD-10-CM

## 2020-07-09 ENCOUNTER — Other Ambulatory Visit: Payer: Self-pay | Admitting: Family Medicine

## 2020-08-03 ENCOUNTER — Other Ambulatory Visit: Payer: Self-pay | Admitting: Family Medicine

## 2020-08-03 DIAGNOSIS — N184 Chronic kidney disease, stage 4 (severe): Secondary | ICD-10-CM

## 2020-08-09 ENCOUNTER — Other Ambulatory Visit: Payer: Self-pay | Admitting: Family Medicine

## 2020-08-19 ENCOUNTER — Other Ambulatory Visit: Payer: Self-pay | Admitting: Family Medicine

## 2020-08-24 ENCOUNTER — Telehealth: Payer: Self-pay

## 2020-08-24 DIAGNOSIS — Z0289 Encounter for other administrative examinations: Secondary | ICD-10-CM

## 2020-08-24 NOTE — Telephone Encounter (Signed)
Patient coming in to do a drug screen this week. Please order.

## 2020-08-24 NOTE — Telephone Encounter (Signed)
Test ordered

## 2020-08-26 DIAGNOSIS — Z0289 Encounter for other administrative examinations: Secondary | ICD-10-CM | POA: Diagnosis not present

## 2020-08-26 DIAGNOSIS — G8929 Other chronic pain: Secondary | ICD-10-CM | POA: Diagnosis not present

## 2020-08-30 ENCOUNTER — Other Ambulatory Visit: Payer: Self-pay

## 2020-08-30 ENCOUNTER — Telehealth (INDEPENDENT_AMBULATORY_CARE_PROVIDER_SITE_OTHER): Payer: Medicare HMO | Admitting: Family Medicine

## 2020-08-30 ENCOUNTER — Encounter: Payer: Self-pay | Admitting: Family Medicine

## 2020-08-30 VITALS — Ht 62.0 in | Wt 167.8 lb

## 2020-08-30 DIAGNOSIS — E038 Other specified hypothyroidism: Secondary | ICD-10-CM | POA: Diagnosis not present

## 2020-08-30 DIAGNOSIS — R7303 Prediabetes: Secondary | ICD-10-CM | POA: Diagnosis not present

## 2020-08-30 DIAGNOSIS — I10 Essential (primary) hypertension: Secondary | ICD-10-CM

## 2020-08-30 DIAGNOSIS — M5116 Intervertebral disc disorders with radiculopathy, lumbar region: Secondary | ICD-10-CM | POA: Diagnosis not present

## 2020-08-30 DIAGNOSIS — N184 Chronic kidney disease, stage 4 (severe): Secondary | ICD-10-CM

## 2020-08-30 DIAGNOSIS — Z7189 Other specified counseling: Secondary | ICD-10-CM

## 2020-08-30 DIAGNOSIS — G8929 Other chronic pain: Secondary | ICD-10-CM | POA: Diagnosis not present

## 2020-08-30 MED ORDER — PREDNISONE 10 MG PO TABS: ORAL_TABLET | ORAL | 0 refills | Status: DC

## 2020-08-30 MED ORDER — AMLODIPINE BESYLATE 2.5 MG PO TABS: 2.5000 mg | ORAL_TABLET | Freq: Every day | ORAL | 5 refills | Status: DC

## 2020-08-30 MED ORDER — CALCITRIOL 0.25 MCG PO CAPS: ORAL_CAPSULE | ORAL | 5 refills | Status: DC

## 2020-08-30 MED ORDER — ROSUVASTATIN CALCIUM 20 MG PO TABS: ORAL_TABLET | ORAL | 5 refills | Status: DC

## 2020-08-30 MED ORDER — TRAZODONE HCL 100 MG PO TABS: 100.0000 mg | ORAL_TABLET | Freq: Every day | ORAL | 5 refills | Status: DC

## 2020-08-30 MED ORDER — ESOMEPRAZOLE MAGNESIUM 40 MG PO CPDR: DELAYED_RELEASE_CAPSULE | ORAL | 5 refills | Status: DC

## 2020-08-30 MED ORDER — TIZANIDINE HCL 4 MG PO TABS: 4.0000 mg | ORAL_TABLET | Freq: Two times a day (BID) | ORAL | 5 refills | Status: DC

## 2020-08-30 NOTE — Patient Instructions (Addendum)
F/U with MD in 12 weeks, call if you need me before  Please get non fasting Chem 7 and Egfr in the next 1 to 2 weeks  We will f/u on urine screening test  Pain contract that we have discussed  is to be signed on the day that you come in for labs  If urine symptoms worsen, and you want urology eval , please let me know  Please continue to be careful not to fall  If you change your mind about vaccines, please come in for those we give, flu and pneumonia!!  Thanks for choosing [facility], we consider it a privelige to serve you.

## 2020-08-30 NOTE — Progress Notes (Signed)
Virtual Visit via Telephone Note  I connected with Katelyn Rowland on 08/30/20 at  2:40 PM EST by telephone and verified that I am speaking with the correct person using two identifiers.  Location: Patient: home Provider: work   I discussed the limitations, risks, security and privacy concerns of performing an evaluation and management service by telephone and the availability of in person appointments. I also discussed with the patient that there may be a patient responsible charge related to this service. The patient expressed understanding and agreed to proceed.   History of Present Illness: F/U chronic problems and address any new or current concerns. Review and update medications and allergies. Review recent lab and radiologic data . Update routine health maintainace.Refuses cancer screening and immunizations Review an encourage improved health habits to include nutrition, exercise and  sleep .  Pain contract renewal, and screening addressed at visit also   Observations/Objective: Ht 5\' 2"  (1.575 m)   Wt 167 lb 12.8 oz (76.1 kg)   BMI 30.69 kg/m  Good communication with no confusion and intact memory. Alert and oriented x 3 No signs of respiratory distress during speech    Assessment and Plan: Encounter for chronic pain management The patient's Controlled Substance registry is reviewed and compliance confirmed. Adequacy of  Pain control and level of function is assessed. Medication dosing is adjusted as deemed appropriate. Twelve weeks of medication is prescribed , with a follow up appointment between 11 to 12 weeks .   Pain management contract discussed Pain contract reviewed with patient and she is in agreement with and understands the terms of the contract. Depression, alcohol use and illicit drug use screen are all negative as documented . 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 The importance of attaining a healthy weight is also discussed.  BP/Weight 08/30/2020 05/31/2020 03/11/2020 01/26/2020 12/30/2019 09/29/2019 16/05/9603  Systolic BP - 540 981 191 478 295 621  Diastolic BP - 78 81 71 71 70 78  Wt. (Lbs) 167.8 179 183.04 178 178 178.12 174.5  BMI 30.69 32.74 33.48 32.56 32.56 32.58 31.92     Controlled, no change in medication   CKD (chronic kidney disease) stage 4, GFR 15-29 ml/min Stable, followed by Nephrolgy  Hypothyroidism Controlled, no change in medication Updated lab needed at/ before next visit.   Prediabetes Patient educated about the importance of limiting  Carbohydrate intake , the need to commit to daily physical activity for a minimum of 30 minutes , and to commit weight loss. The fact that changes in all these areas will reduce or eliminate all together the development of diabetes is stressed.   Diabetic Labs Latest Ref Rng & Units 05/31/2020 05/19/2020 09/29/2019 03/19/2019 06/18/2018  HbA1c 4.0 - 5.6 % 5.9(A) - 6.0(H) - 5.5  Microalbumin Not Estab. ug/mL - - - - -  Micro/Creat Ratio 0.0 - 30.0 mg/g creat - - - - -  Chol 100 - 199 mg/dL - 165 139 229(H) 116  HDL >39 mg/dL - 51 54 49 45  Calc LDL 0 - 99 mg/dL - 81 63 143(H) 45  Triglycerides 0 - 149 mg/dL - 199(H) 135 187(H) 128  Creatinine 0.57 -  1.00 mg/dL - 2.29(H) 1.93(H) 2.03(H) 1.78(H)   BP/Weight 08/30/2020 05/31/2020 03/11/2020 01/26/2020 12/30/2019 09/29/2019 99/77/4142  Systolic BP - 395 320 233 435 686 168  Diastolic BP - 78 81 71 71 70 78  Wt. (Lbs) 167.8 179 183.04 178 178 178.12 174.5  BMI 30.69 32.74 33.48 32.56 32.56 32.58 31.92   Foot/eye exam completion dates 07/11/2018 04/11/2017  Foot exam Order - -  Foot  Form Completion Done Done        Follow Up Instructions:    I discussed the assessment and treatment plan with the patient. The patient was provided an opportunity to ask questions and all were answered. The patient agreed with the plan and demonstrated an understanding of the instructions.   The patient was advised to call back or seek an in-person evaluation if the symptoms worsen or if the condition fails to improve as anticipated.  I provided 28 minutes of non-face-to-face time during this encounter.   Tula Nakayama, MD

## 2020-09-02 LAB — TOXASSURE SELECT 13 (MW), URINE

## 2020-09-06 ENCOUNTER — Encounter: Payer: Self-pay | Admitting: Family Medicine

## 2020-09-06 DIAGNOSIS — Z7189 Other specified counseling: Secondary | ICD-10-CM | POA: Insufficient documentation

## 2020-09-06 MED ORDER — METHADONE HCL 10 MG PO TABS: ORAL_TABLET | ORAL | 0 refills | Status: AC

## 2020-09-06 NOTE — Assessment & Plan Note (Signed)
Pain contract reviewed with patient and she is in agreement with and understands the terms of the contract. Depression, alcohol use and illicit drug use screen are all negative as documented . She will sign contarct and provide urine for testing in the next week

## 2020-09-06 NOTE — Assessment & Plan Note (Signed)
Stable, followed by Nephrolgy

## 2020-09-06 NOTE — Assessment & Plan Note (Signed)
The patient's Controlled Substance registry is reviewed and compliance confirmed. Adequacy of  Pain control and level of function is assessed. Medication dosing is adjusted as deemed appropriate. Twelve weeks of medication is prescribed , with a follow up appointment between 11 to 12 weeks .  

## 2020-09-06 NOTE — Assessment & Plan Note (Signed)
DASH diet and commitment to daily physical activity for a minimum of 30 minutes discussed and encouraged, as a part of hypertension management. The importance of attaining a healthy weight is also discussed.  BP/Weight 08/30/2020 05/31/2020 03/11/2020 01/26/2020 12/30/2019 09/29/2019 36/43/8377  Systolic BP - 939 688 648 472 072 182  Diastolic BP - 78 81 71 71 70 78  Wt. (Lbs) 167.8 179 183.04 178 178 178.12 174.5  BMI 30.69 32.74 33.48 32.56 32.56 32.58 31.92     Controlled, no change in medication

## 2020-09-06 NOTE — Assessment & Plan Note (Signed)
Patient educated about the importance of limiting  Carbohydrate intake , the need to commit to daily physical activity for a minimum of 30 minutes , and to commit weight loss. The fact that changes in all these areas will reduce or eliminate all together the development of diabetes is stressed.   Diabetic Labs Latest Ref Rng & Units 05/31/2020 05/19/2020 09/29/2019 03/19/2019 06/18/2018  HbA1c 4.0 - 5.6 % 5.9(A) - 6.0(H) - 5.5  Microalbumin Not Estab. ug/mL - - - - -  Micro/Creat Ratio 0.0 - 30.0 mg/g creat - - - - -  Chol 100 - 199 mg/dL - 165 139 229(H) 116  HDL >39 mg/dL - 51 54 49 45  Calc LDL 0 - 99 mg/dL - 81 63 143(H) 45  Triglycerides 0 - 149 mg/dL - 199(H) 135 187(H) 128  Creatinine 0.57 - 1.00 mg/dL - 2.29(H) 1.93(H) 2.03(H) 1.78(H)   BP/Weight 08/30/2020 05/31/2020 03/11/2020 01/26/2020 12/30/2019 09/29/2019 19/50/9326  Systolic BP - 712 458 099 833 825 053  Diastolic BP - 78 81 71 71 70 78  Wt. (Lbs) 167.8 179 183.04 178 178 178.12 174.5  BMI 30.69 32.74 33.48 32.56 32.56 32.58 31.92   Foot/eye exam completion dates 07/11/2018 04/11/2017  Foot exam Order - -  Foot Form Completion Done Done

## 2020-09-06 NOTE — Assessment & Plan Note (Signed)
Severe arthritis with high fall rsik and requiring chroic opiate management Home safety reviewed, no falls reported

## 2020-09-06 NOTE — Assessment & Plan Note (Signed)
Controlled, no change in medication Updated lab needed at/ before next visit.  

## 2020-09-09 DIAGNOSIS — I10 Essential (primary) hypertension: Secondary | ICD-10-CM | POA: Diagnosis not present

## 2020-09-09 DIAGNOSIS — E038 Other specified hypothyroidism: Secondary | ICD-10-CM | POA: Diagnosis not present

## 2020-09-10 ENCOUNTER — Other Ambulatory Visit: Payer: Self-pay | Admitting: Family Medicine

## 2020-09-10 LAB — BMP8+EGFR
BUN/Creatinine Ratio: 15 (ref 12–28)
BUN: 37 mg/dL — ABNORMAL HIGH (ref 8–27)
CO2: 17 mmol/L — ABNORMAL LOW (ref 20–29)
Calcium: 9.3 mg/dL (ref 8.7–10.3)
Chloride: 106 mmol/L (ref 96–106)
Creatinine, Ser: 2.41 mg/dL — ABNORMAL HIGH (ref 0.57–1.00)
GFR calc Af Amer: 21 mL/min/{1.73_m2} — ABNORMAL LOW (ref >59)
GFR calc non Af Amer: 18 mL/min/{1.73_m2} — ABNORMAL LOW (ref >59)
Glucose: 101 mg/dL — ABNORMAL HIGH (ref 65–99)
Potassium: 4.9 mmol/L (ref 3.5–5.2)
Sodium: 141 mmol/L (ref 134–144)

## 2020-09-10 LAB — TSH: TSH: 0.046 u[IU]/mL — ABNORMAL LOW (ref 0.450–4.500)

## 2020-09-10 MED ORDER — LEVOTHYROXINE SODIUM 100 MCG PO TABS: 100.0000 ug | ORAL_TABLET | Freq: Every day | ORAL | 1 refills | Status: DC

## 2020-09-15 ENCOUNTER — Other Ambulatory Visit: Payer: Self-pay

## 2020-09-15 DIAGNOSIS — E038 Other specified hypothyroidism: Secondary | ICD-10-CM

## 2020-10-13 ENCOUNTER — Other Ambulatory Visit: Payer: Self-pay | Admitting: Family Medicine

## 2020-10-21 DIAGNOSIS — E038 Other specified hypothyroidism: Secondary | ICD-10-CM | POA: Diagnosis not present

## 2020-10-22 ENCOUNTER — Other Ambulatory Visit: Payer: Self-pay | Admitting: Family Medicine

## 2020-10-22 LAB — TSH: TSH: 0.35 u[IU]/mL — ABNORMAL LOW (ref 0.450–4.500)

## 2020-10-22 MED ORDER — LEVOTHYROXINE SODIUM 100 MCG PO TABS: ORAL_TABLET | ORAL | 3 refills | Status: DC

## 2020-11-15 ENCOUNTER — Other Ambulatory Visit: Payer: Self-pay | Admitting: Family Medicine

## 2020-11-29 ENCOUNTER — Ambulatory Visit (INDEPENDENT_AMBULATORY_CARE_PROVIDER_SITE_OTHER): Payer: Medicare HMO | Admitting: Family Medicine

## 2020-11-29 ENCOUNTER — Encounter: Payer: Self-pay | Admitting: Family Medicine

## 2020-11-29 ENCOUNTER — Other Ambulatory Visit: Payer: Self-pay

## 2020-11-29 VITALS — BP 110/59 | HR 84 | Temp 97.4°F | Ht 62.0 in | Wt 160.0 lb

## 2020-11-29 DIAGNOSIS — E1169 Type 2 diabetes mellitus with other specified complication: Secondary | ICD-10-CM | POA: Diagnosis not present

## 2020-11-29 DIAGNOSIS — D631 Anemia in chronic kidney disease: Secondary | ICD-10-CM

## 2020-11-29 DIAGNOSIS — E038 Other specified hypothyroidism: Secondary | ICD-10-CM | POA: Diagnosis not present

## 2020-11-29 DIAGNOSIS — E785 Hyperlipidemia, unspecified: Secondary | ICD-10-CM

## 2020-11-29 DIAGNOSIS — M4726 Other spondylosis with radiculopathy, lumbar region: Secondary | ICD-10-CM

## 2020-11-29 DIAGNOSIS — I10 Essential (primary) hypertension: Secondary | ICD-10-CM

## 2020-11-29 DIAGNOSIS — K219 Gastro-esophageal reflux disease without esophagitis: Secondary | ICD-10-CM

## 2020-11-29 DIAGNOSIS — E559 Vitamin D deficiency, unspecified: Secondary | ICD-10-CM

## 2020-11-29 DIAGNOSIS — E669 Obesity, unspecified: Secondary | ICD-10-CM

## 2020-11-29 DIAGNOSIS — I739 Peripheral vascular disease, unspecified: Secondary | ICD-10-CM

## 2020-11-29 DIAGNOSIS — R7303 Prediabetes: Secondary | ICD-10-CM | POA: Diagnosis not present

## 2020-11-29 DIAGNOSIS — N184 Chronic kidney disease, stage 4 (severe): Secondary | ICD-10-CM

## 2020-11-29 DIAGNOSIS — K222 Esophageal obstruction: Secondary | ICD-10-CM

## 2020-11-29 DIAGNOSIS — G8929 Other chronic pain: Secondary | ICD-10-CM

## 2020-11-29 DIAGNOSIS — Z9181 History of falling: Secondary | ICD-10-CM

## 2020-11-29 DIAGNOSIS — N189 Chronic kidney disease, unspecified: Secondary | ICD-10-CM

## 2020-11-29 MED ORDER — PREDNISONE 10 MG PO TABS: ORAL_TABLET | ORAL | 0 refills | Status: DC

## 2020-11-29 NOTE — Patient Instructions (Addendum)
F/U in 12 weeks, call if you need me before  Your ear exam is normal  I do not recommend back brace  I do think and hope that the equipment for circulation willl help  Fasting lipid, cmp and EGFR, TSH, hBA1C, CBC, vit D 3 to 5 days before next visit  Be careful not to fall  Thanks for choosing [facility], we consider it a privelige to serve you.

## 2020-12-05 ENCOUNTER — Encounter: Payer: Self-pay | Admitting: Family Medicine

## 2020-12-05 MED ORDER — METHADONE HCL 10 MG PO TABS: ORAL_TABLET | ORAL | 0 refills | Status: AC

## 2020-12-05 MED ORDER — METHADONE HCL 10 MG PO TABS: ORAL_TABLET | ORAL | 0 refills | Status: DC

## 2020-12-05 NOTE — Assessment & Plan Note (Signed)
Home safety discussed 

## 2020-12-05 NOTE — Assessment & Plan Note (Signed)
The patient's Controlled Substance registry is reviewed and compliance confirmed. Adequacy of  Pain control and level of function is assessed. Medication dosing is adjusted as deemed appropriate. Twelve weeks of medication is prescribed , with a follow up appointment between 11 to 12 weeks .  

## 2020-12-05 NOTE — Assessment & Plan Note (Signed)
Controlled, no change in medication  

## 2020-12-05 NOTE — Assessment & Plan Note (Signed)
Hyperlipidemia:Low fat diet discussed and encouraged.   Lipid Panel  Lab Results  Component Value Date   CHOL 165 05/19/2020   HDL 51 05/19/2020   LDLCALC 81 05/19/2020   TRIG 199 (H) 05/19/2020   CHOLHDL 3.2 05/19/2020     Updated lab needed at/ before next visit.

## 2020-12-05 NOTE — Progress Notes (Signed)
   Katelyn Rowland     MRN: [id number]      DOB: [birth year]   HPI [name] is here for follow up and re-evaluation of chronic medical conditions, medication management and review of any available recent lab and radiology data.  Has questions about the value of a back brace, also states she recently purchased  Equipment to help with circulation in her legs, I approve that but not the brace C/o ear pressure and wants them checked    ROS Denies recent fever or chills. Denies sinus pressure, nasal congestion, ear pain or sore throat. Denies chest congestion, productive cough or wheezing. Denies chest pains, palpitations and leg swelling Denies abdominal pain, nausea, vomiting,diarrhea or constipation.   Denies dysuria, frequency, hesitancy or incontinence. Chronic  joint pain, swelling and limitation in mobility. Denies headaches, seizures,c/o lower extremity numbness, and  tingling. Denies depression, anxiety or insomnia. Denies skin break down or rash.   PE  BP (!) 110/59 (BP Location: Left Arm, Patient Position: Sitting, Cuff Size: Large)   Pulse 84   Temp (!) 97.4 F (36.3 C) (Temporal)   Ht 5\' 2"  (1.575 m)   Wt 160 lb (72.6 kg)   SpO2 95%   BMI 29.26 kg/m   Patient alert and oriented and in no cardiopulmonary distress.  HEENT: No facial asymmetry, EOMI,     Neck decreased ROM.TM clear bilaterally with good light reflex  Chest: Clear to auscultation bilaterally.  CVS: S1, S2 no murmurs, no S3.Regular rate.  ABD: Soft non tender.   Ext: No edema  MS: decreased  ROM spine, shoulders, hips and knees.  Skin: Intact, no ulcerations or rash noted.  Psych: Good eye contact, normal affect. Memory intact not anxious or depressed appearing.  CNS: CN 2-12 intact, power,  normal throughout.no focal deficits noted.   Assessment & Plan  Hypothyroidism Controlled, no change in medication Updated lab needed at/ before next visit.   Other spondylosis with radiculopathy,  lumbar region Unchanged, continue current pain management  Hyperlipidemia LDL goal <100 Hyperlipidemia:Low fat diet discussed and encouraged.   Lipid Panel  Lab Results  Component Value Date   CHOL 165 05/19/2020   HDL 51 05/19/2020   LDLCALC 81 05/19/2020   TRIG 199 (H) 05/19/2020   CHOLHDL 3.2 05/19/2020     Updated lab needed at/ before next visit.   Encounter for chronic pain management The patient's Controlled Substance registry is reviewed and compliance confirmed. Adequacy of  Pain control and level of function is assessed. Medication dosing is adjusted as deemed appropriate. Twelve weeks of medication is prescribed , with a follow up appointment between 11 to 12 weeks .   At high risk for falls Home safety discussed  GERD with stricture Controlled, no change in medication

## 2020-12-05 NOTE — Assessment & Plan Note (Signed)
Unchanged, continue current pain management

## 2020-12-05 NOTE — Assessment & Plan Note (Signed)
Controlled, no change in medication Updated lab needed at/ before next visit.  

## 2020-12-06 ENCOUNTER — Telehealth: Payer: Self-pay

## 2020-12-06 NOTE — Telephone Encounter (Signed)
It has a fill date of 12/07/20 so the pharmacy will be able to view and fill it tomorrow.

## 2020-12-06 NOTE — Telephone Encounter (Signed)
Please send RX for Methodone to [facility] , she is out

## 2020-12-07 NOTE — Telephone Encounter (Signed)
LM for the patient

## 2020-12-16 ENCOUNTER — Other Ambulatory Visit: Payer: Self-pay | Admitting: Family Medicine

## 2021-01-06 ENCOUNTER — Other Ambulatory Visit: Payer: Self-pay | Admitting: Family Medicine

## 2021-01-06 DIAGNOSIS — N184 Chronic kidney disease, stage 4 (severe): Secondary | ICD-10-CM

## 2021-01-11 ENCOUNTER — Other Ambulatory Visit: Payer: Self-pay | Admitting: Family Medicine

## 2021-01-18 ENCOUNTER — Telehealth: Payer: Self-pay | Admitting: Family Medicine

## 2021-01-18 NOTE — Telephone Encounter (Signed)
Left message for patient to call back and schedule Medicare Annual Wellness Visit (AWV) either virtually or in office.   Last AWV 01/26/20 please schedule at anytime with [facility]  health coach  This should be a 40 minute visit.

## 2021-01-26 ENCOUNTER — Other Ambulatory Visit: Payer: Self-pay

## 2021-01-26 ENCOUNTER — Ambulatory Visit (INDEPENDENT_AMBULATORY_CARE_PROVIDER_SITE_OTHER): Payer: Medicare HMO

## 2021-01-26 DIAGNOSIS — Z0000 Encounter for general adult medical examination without abnormal findings: Secondary | ICD-10-CM

## 2021-01-26 NOTE — Patient Instructions (Signed)
[name] , Thank you for taking time to come for your Medicare Wellness Visit. I appreciate your ongoing commitment to your health goals. Please review the following plan we discussed and let me know if I can assist you in the future.   Screening recommendations/referrals: Colonoscopy: No longer required   Mammogram: No longer required  Bone Density: Complete  Recommended yearly ophthalmology/optometry visit for glaucoma screening and checkup Recommended yearly dental visit for hygiene and checkup  Vaccinations: Influenza vaccine: Complete Pneumococcal vaccine: Complete  Tdap vaccine: Up to date  Shingles vaccine: Refused    Advanced directives: Yes  Conditions/risks identified: None   Next appointment: None scheduled at this time.    Preventive Care 45 Years and Older, Female Preventive care refers to lifestyle choices and visits with your health care provider that can promote health and wellness. What does preventive care include? A yearly physical exam. This is also called an annual well check. Dental exams once or twice a year. Routine eye exams. Ask your health care provider how often you should have your eyes checked. Personal lifestyle choices, including: Daily care of your teeth and gums. Regular physical activity. Eating a healthy diet. Avoiding tobacco and drug use. Limiting alcohol use. Practicing safe sex. Taking low-dose aspirin every day. Taking vitamin and mineral supplements as recommended by your health care provider. What happens during an annual well check? The services and screenings done by your health care provider during your annual well check will depend on your age, overall health, lifestyle risk factors, and family history of disease. Counseling  Your health care provider may ask you questions about your: Alcohol use. Tobacco use. Drug use. Emotional well-being. Home and relationship well-being. Sexual activity. Eating habits. History of  falls. Memory and ability to understand (cognition). Work and work Statistician. Reproductive health. Screening  You may have the following tests or measurements: Height, weight, and BMI. Blood pressure. Lipid and cholesterol levels. These may be checked every 5 years, or more frequently if you are over 17 years old. Skin check. Lung cancer screening. You may have this screening every year starting at age 22 if you have a 30-pack-year history of smoking and currently smoke or have quit within the past 15 years. Fecal occult blood test (FOBT) of the stool. You may have this test every year starting at age 46. Flexible sigmoidoscopy or colonoscopy. You may have a sigmoidoscopy every 5 years or a colonoscopy every 10 years starting at age 40. Hepatitis C blood test. Hepatitis B blood test. Sexually transmitted disease (STD) testing. Diabetes screening. This is done by checking your blood sugar (glucose) after you have not eaten for a while (fasting). You may have this done every 1-3 years. Bone density scan. This is done to screen for osteoporosis. You may have this done starting at age 67. Mammogram. This may be done every 1-2 years. Talk to your health care provider about how often you should have regular mammograms. Talk with your health care provider about your test results, treatment options, and if necessary, the need for more tests. Vaccines  Your health care provider may recommend certain vaccines, such as: Influenza vaccine. This is recommended every year. Tetanus, diphtheria, and acellular pertussis (Tdap, Td) vaccine. You may need a Td booster every 10 years. Zoster vaccine. You may need this after age 14. Pneumococcal 13-valent conjugate (PCV13) vaccine. One dose is recommended after age 57. Pneumococcal polysaccharide (PPSV23) vaccine. One dose is recommended after age 62. Talk to your health care  provider about which screenings and vaccines you need and how often you need  them. This information is not intended to replace advice given to you by your health care provider. Make sure you discuss any questions you have with your health care provider. Document Released: 08/20/2015 Document Revised: 04/12/2016 Document Reviewed: 05/25/2015 Elsevier Interactive Patient Education  2017 [employer] Prevention in the Home Falls can cause injuries. They can happen to people of all ages. There are many things you can do to make your home safe and to help prevent falls. What can I do on the outside of my home? Regularly fix the edges of walkways and driveways and fix any cracks. Remove anything that might make you trip as you walk through a door, such as a raised step or threshold. Trim any bushes or trees on the path to your home. Use bright outdoor lighting. Clear any walking paths of anything that might make someone trip, such as rocks or tools. Regularly check to see if handrails are loose or broken. Make sure that both sides of any steps have handrails. Any raised decks and porches should have guardrails on the edges. Have any leaves, snow, or ice cleared regularly. Use sand or salt on walking paths during winter. Clean up any spills in your garage right away. This includes oil or grease spills. What can I do in the bathroom? Use night lights. Install grab bars by the toilet and in the tub and shower. Do not use towel bars as grab bars. Use non-skid mats or decals in the tub or shower. If you need to sit down in the shower, use a plastic, non-slip stool. Keep the floor dry. Clean up any water that spills on the floor as soon as it happens. Remove soap buildup in the tub or shower regularly. Attach bath mats securely with double-sided non-slip rug tape. Do not have throw rugs and other things on the floor that can make you trip. What can I do in the bedroom? Use night lights. Make sure that you have a light by your bed that is easy to reach. Do not use  any sheets or blankets that are too big for your bed. They should not hang down onto the floor. Have a firm chair that has side arms. You can use this for support while you get dressed. Do not have throw rugs and other things on the floor that can make you trip. What can I do in the kitchen? Clean up any spills right away. Avoid walking on wet floors. Keep items that you use a lot in easy-to-reach places. If you need to reach something above you, use a strong step stool that has a grab bar. Keep electrical cords out of the way. Do not use floor polish or wax that makes floors slippery. If you must use wax, use non-skid floor wax. Do not have throw rugs and other things on the floor that can make you trip. What can I do with my stairs? Do not leave any items on the stairs. Make sure that there are handrails on both sides of the stairs and use them. Fix handrails that are broken or loose. Make sure that handrails are as long as the stairways. Check any carpeting to make sure that it is firmly attached to the stairs. Fix any carpet that is loose or worn. Avoid having throw rugs at the top or bottom of the stairs. If you do have throw rugs, attach them to the  floor with carpet tape. Make sure that you have a light switch at the top of the stairs and the bottom of the stairs. If you do not have them, ask someone to add them for you. What else can I do to help prevent falls? Wear shoes that: Do not have high heels. Have rubber bottoms. Are comfortable and fit you well. Are closed at the toe. Do not wear sandals. If you use a stepladder: Make sure that it is fully opened. Do not climb a closed stepladder. Make sure that both sides of the stepladder are locked into place. Ask someone to hold it for you, if possible. Clearly mark and make sure that you can see: Any grab bars or handrails. First and last steps. Where the edge of each step is. Use tools that help you move around (mobility aids)  if they are needed. These include: Canes. Walkers. Scooters. Crutches. Turn on the lights when you go into a dark area. Replace any light bulbs as soon as they burn out. Set up your furniture so you have a clear path. Avoid moving your furniture around. If any of your floors are uneven, fix them. If there are any pets around you, be aware of where they are. Review your medicines with your doctor. Some medicines can make you feel dizzy. This can increase your chance of falling. Ask your doctor what other things that you can do to help prevent falls. This information is not intended to replace advice given to you by your health care provider. Make sure you discuss any questions you have with your health care provider. Document Released: 05/20/2009 Document Revised: 12/30/2015 Document Reviewed: 08/28/2014 Elsevier Interactive Patient Education  2017 [employer].

## 2021-01-26 NOTE — Progress Notes (Addendum)
Subjective:   Katelyn Rowland is a 80 y.o. female who presents for Medicare Annual (Subsequent) preventive examination.        Objective:    There were no vitals filed for this visit. There is no height or weight on file to calculate BMI.  Advanced Directives 03/02/2019 01/07/2018 03/29/2016 02/04/2015 11/18/2014 10/16/2014 06/13/2012  Does Patient Have a Medical Advance Directive? No Yes No No Yes No Patient has advance directive, copy not in chart  Type of Advance Directive - Out of facility DNR (pink MOST or yellow form);Living will;Healthcare Power of [place] - -  Does patient want to make changes to medical advance directive? - - - No - Patient declined - - -  Copy of [place] in Chart? - Yes - No - copy requested No - copy requested - -  Would patient like information on creating a medical advance directive? No - Patient declined - No - patient declined information - - No - patient declined information -  Pre-existing out of facility DNR order (yellow form or pink MOST form) - Yellow form placed in chart (order not valid for inpatient use) - - - - -    Current Medications (verified) Outpatient Encounter Medications as of 01/26/2021  Medication Sig   amLODipine (NORVASC) 2.5 MG tablet Take 1 tablet (2.5 mg total) by mouth daily.   calcitRIOL (ROCALTROL) 0.25 MCG capsule TAKE (1) CAPSULE BY MOUTH THREE TIMES WEEKLY.   diphenhydrAMINE (BENADRYL) 25 mg capsule Take 25 mg by mouth every 8 (eight) hours as needed for allergies.   esomeprazole (NEXIUM) 40 MG capsule TAKE ONE CAPSULE BY MOUTH DAILY BEFORE BREAKFAST.   levothyroxine (SYNTHROID) 100 MCG tablet Take one tablet by mouth once daily from Monday through Friday and take half tablet by mouth on Saturday and Sunday   lisinopril (ZESTRIL) 2.5 MG tablet Take 1 tablet (2.5 mg total) by mouth daily.   methadone (DOLOPHINE) 10 MG tablet Take one tablet by mouth  three times daily for chronic back pain   [START ON 02/05/2021] methadone (DOLOPHINE) 10 MG tablet Take one tablet by mouth three times daily for chronic back pain   polyethylene glycol powder (GLYCOLAX/MIRALAX) powder MIX 1 CAPFUL (17 GRAMS) WITH 8OZ OF WATER OR JUICE DAILY.   predniSONE (DELTASONE) 10 MG tablet Take one tablet by mouth three times weekly , as needed,for pain   predniSONE (DELTASONE) 10 MG tablet Take one tablet by mouth three times weekly, as needed, for excessive pain   rosuvastatin (CRESTOR) 20 MG tablet TAKE 1 TABLET EVERY MONDAY AND THURSDAY ONLY.   tiZANidine (ZANAFLEX) 4 MG tablet Take 1 tablet (4 mg total) by mouth 2 (two) times daily.   traZODone (DESYREL) 100 MG tablet Take 1 tablet (100 mg total) by mouth at bedtime.   No facility-administered encounter medications on file as of 01/26/2021.    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, recurrent most recent hopitalisation    Edema of both legs    GERD (gastroesophageal reflux disease)    Headache(784.0)    Hematoma of leg  left    High blood pressure    Hypertension    margaret simpson  [id number]   Muscle cramps    Neck pain    PONV (postoperative nausea and vomiting)    Reflux    Thyroid disorder    Past Surgical History:  Procedure Laterality Date   ABDOMINAL HYSTERECTOMY     APPENDECTOMY     BIOPSY N/A 11/20/2014   Procedure: BIOPSY;  Surgeon: Rogene Houston, MD;  Location: [facility];  Service: Endoscopy;  Laterality: N/A;   CERVICAL SPINE SURGERY     ESOPHAGEAL DILATION N/A 11/20/2014   Procedure: ESOPHAGEAL DILATION [place];  Surgeon: Rogene Houston, MD;  Location: [facility];   Service: Endoscopy;  Laterality: N/A;   ESOPHAGOGASTRODUODENOSCOPY (EGD) WITH PROPOFOL N/A 11/20/2014   Procedure: ESOPHAGOGASTRODUODENOSCOPY (EGD) WITH PROPOFOL;  Surgeon: Rogene Houston, MD;  Location: [facility];  Service: Endoscopy;  Laterality: N/A;   FOOT NEUROMA SURGERY     right    ivc filter placement, following recurrent left DVT  07/2009   NASAL SINUS SURGERY     THORACIC DISCECTOMY  06/12/2012   Procedure: THORACIC DISCECTOMY;  Surgeon: Floyce Stakes, MD;  Location: [place] [facility];  Service: Neurosurgery;  Laterality: N/A;  Thoracic Ten-Eleven Thoracic Laminectomy   TONSILLECTOMY     TONSILLECTOMY AND ADENOIDECTOMY     unilateal oopherectomy     VESICOVAGINAL FISTULA CLOSURE W/ TAH     Family History  Problem Relation Age of Onset   Emphysema Sister    Kidney disease Sister    Diabetes Mother    Diabetes Father    Diabetes Brother    Arthritis Other    Kidney disease Other    Social History   Socioeconomic History   Marital status: Widowed    Spouse name: Not on file   Number of children: 4   Years of education: 14   Highest education level: Not on file  Occupational History   Occupation: disabled     Employer: RETIRED  Tobacco Use   Smoking status: Former    Packs/day: 1.00    Years: 42.00    Pack years: 42.00    Types: Cigarettes    Quit date: 06/04/2010    Years since quitting: 10.6   Smokeless tobacco: Never  Vaping Use   Vaping Use: Never used  Substance and Sexual Activity   Alcohol use: No   Drug use: No   Sexual activity: Never    Birth control/protection: Surgical  Other Topics Concern   Not on file  Social History Narrative   Not on file   Social Determinants of Health   Financial Resource Strain: Not on file  Food Insecurity: Not on file  Transportation Needs: Not on file  Physical Activity: Not on file  Stress: Not on file  Social Connections: Not on file    Tobacco Counseling Counseling given: Not Answered                    Diabetic? No          Activities of Daily Living No flowsheet data found.  Patient Care Team: Fayrene Helper, MD as PCP - General  Indicate any recent Medical Services you may have received from other than [facility] providers in the past year (date may be approximate).     Assessment:   This is a routine wellness examination for Katelyn Rowland.  Hearing/Vision screen No results found.  Dietary issues and exercise  activities discussed:     Goals Addressed   None   Depression Screen PHQ 2/9 Scores 11/29/2020 08/30/2020 05/31/2020 03/11/2020 01/26/2020 12/30/2019 09/29/2019  PHQ - 2 Score 0 1 0 0 0 0 0  PHQ- 9 Score - - - - - 0 -    Fall Risk Fall Risk  11/29/2020 08/30/2020 05/31/2020 03/11/2020 12/30/2019  Falls in the past year? 0 0 0 0 0  Number falls in past yr: 0 0 - - 0  Injury with Fall? 0 0 - - 0  Risk Factor Category  - - - - -  Risk for fall due to : No Fall Risks - - - No Fall Risks  Follow up Falls evaluation completed - - - Falls evaluation completed    FALL RISK PREVENTION PERTAINING TO THE HOME:  Any stairs in or around the home? No If so, are there any without handrails? No  Home free of loose throw rugs in walkways, pet beds, electrical cords, etc? Yes  Adequate lighting in your home to reduce risk of falls? Yes   ASSISTIVE DEVICES UTILIZED TO PREVENT FALLS:  Life alert? No  Use of a cane, walker or w/c? No  Grab bars in the bathroom? Yes  Shower chair or bench in shower? Yes  Elevated toilet seat or a handicapped toilet? No   TIMED UP AND GO:  Was the test performed? No .     Cognitive Function: MMSE - Mini Mental State Exam 12/10/2015  Orientation to time 5  Orientation to Place 5  Registration 3  Attention/ Calculation 5  Recall 3  Language- name 2 objects 2  Language- repeat 1  Language- follow 3 step command 3  Language- read & follow direction 1  Write a sentence 1  Copy design 0  Total score 29     6CIT Screen 01/26/2020 01/15/2019  01/07/2018  What Year? 0 points 0 points 0 points  What month? 0 points 0 points 0 points  What time? 0 points 0 points 0 points  Count back from 20 0 points 0 points 0 points  Months in reverse 0 points 0 points 0 points  Repeat phrase 2 points 0 points 0 points  Total Score 2 0 0    Immunizations Immunization History  Administered Date(s) Administered   Pneumococcal Polysaccharide-23 03/26/2006   Td 12/30/2003    TDAP status: Up to date  Flu Vaccine status: Up to date  Pneumococcal vaccine status: Up to date  Covid-19 vaccine status: Declined, Education has been provided regarding the importance of this vaccine but patient still declined. Advised may receive this vaccine at local pharmacy or Health Dept.or vaccine clinic. Aware to provide a copy of the vaccination record if obtained from local pharmacy or Health Dept. Verbalized acceptance and understanding.  Qualifies for Shingles Vaccine? No   Zostavax completed No   Shingrix Completed?: No.    Education has been provided regarding the importance of this vaccine. Patient has been advised to call insurance company to determine out of pocket expense if they have not yet received this vaccine. Advised may also receive vaccine at local pharmacy or Health Dept. Verbalized acceptance and understanding.  Screening Tests Health Maintenance  Topic Date Due   Zoster Vaccines- Shingrix (1 of 2) Never done   COVID-19 Vaccine (1) 02/10/2021 (Originally 01/21/1946)   TETANUS/TDAP  08/24/2022 (Originally 12/29/2013)   PNA vac Low Risk Adult (2 of 2 - PCV13) 08/23/2023 (Originally 03/27/2007)   INFLUENZA VACCINE  03/07/2021  DEXA SCAN  Completed   HPV VACCINES  Aged Out    Health Maintenance  Health Maintenance Due  Topic Date Due   Zoster Vaccines- Shingrix (1 of 2) Never done    Colorectal cancer screening: No longer required.   Mammogram: No longer required.   Bone Density Scan: Complete  Lung Cancer Screening: (Low Dose CT  Chest recommended if Age 28-80 years, 30 pack-year currently smoking OR have quit w/in 15years.) does not qualify.   Additional Screening:  Hepatitis C Screening: does qualify; complete.   Vision Screening: Recommended annual ophthalmology exams for early detection of glaucoma and other disorders of the eye. Is the patient up to date with their annual eye exam?  Yes  Who is the provider or what is the name of the office in which the patient attends annual eye exams? Pt refused referral to eye Dr.  If pt is not established with a provider, would they like to be referred to a provider to establish care? No .   Dental Screening: Recommended annual dental exams for proper oral hygiene  Community Resource Referral / Chronic Care Management: CRR required this visit?  No   CCM required this visit?  No      Plan:     I have personally reviewed and noted the following in the patient's chart:   Medical and social history Use of alcohol, tobacco or illicit drugs  Current medications and supplements including opioid prescriptions.  Functional ability and status Nutritional status Physical activity Advanced directives List of other physicians Hospitalizations, surgeries, and ER visits in previous 12 months Vitals Screenings to include cognitive, depression, and falls Referrals and appointments  In addition, I have reviewed and discussed with patient certain preventive protocols, quality metrics, and best practice recommendations. A written personalized care plan for preventive services as well as general preventive health recommendations were provided to patient.     Lonn Georgia, LPN   [id number]   Nurse Notes: Pt consents to annual wellness visit via audio. Pt was present in the home at the time of the visit and provider in the office. This call took approx 20 min to conduct. Pt declines referral to opthamology for eye exam.   I have reviewed and I agree with the above AWV  documentation.  Norwood Levo. Moshe Cipro, [facility] 01/31/2021

## 2021-02-01 ENCOUNTER — Other Ambulatory Visit: Payer: Self-pay | Admitting: Family Medicine

## 2021-02-01 ENCOUNTER — Telehealth: Payer: Self-pay | Admitting: *Deleted

## 2021-02-01 MED ORDER — METHADONE HCL 10 MG PO TABS: ORAL_TABLET | ORAL | 0 refills | Status: DC

## 2021-02-01 NOTE — Telephone Encounter (Signed)
It is sent to the pharmacy but it has a fill date of Saturday and she said since they don't deliver meds on Saturday she needs it sent to have a fill date of 02/04/21

## 2021-02-01 NOTE — Telephone Encounter (Signed)
Pt needs methadone refilled she is going to be out on Saturday but they deliver her medicines and she will need this sent on Friday. She would also like a call back to let her know this medication has been sent in.

## 2021-02-02 NOTE — Telephone Encounter (Signed)
Called [facility] and cancelled the July 2 rx and they will fill it on July 1 and lvm for pt

## 2021-02-10 ENCOUNTER — Other Ambulatory Visit: Payer: Self-pay | Admitting: Family Medicine

## 2021-02-23 DIAGNOSIS — I10 Essential (primary) hypertension: Secondary | ICD-10-CM | POA: Diagnosis not present

## 2021-02-23 DIAGNOSIS — E038 Other specified hypothyroidism: Secondary | ICD-10-CM | POA: Diagnosis not present

## 2021-02-23 DIAGNOSIS — N189 Chronic kidney disease, unspecified: Secondary | ICD-10-CM | POA: Diagnosis not present

## 2021-02-23 DIAGNOSIS — I739 Peripheral vascular disease, unspecified: Secondary | ICD-10-CM | POA: Diagnosis not present

## 2021-02-23 DIAGNOSIS — E669 Obesity, unspecified: Secondary | ICD-10-CM | POA: Diagnosis not present

## 2021-02-23 DIAGNOSIS — N184 Chronic kidney disease, stage 4 (severe): Secondary | ICD-10-CM | POA: Diagnosis not present

## 2021-02-23 DIAGNOSIS — R7303 Prediabetes: Secondary | ICD-10-CM | POA: Diagnosis not present

## 2021-02-23 DIAGNOSIS — E1169 Type 2 diabetes mellitus with other specified complication: Secondary | ICD-10-CM | POA: Diagnosis not present

## 2021-02-23 DIAGNOSIS — E785 Hyperlipidemia, unspecified: Secondary | ICD-10-CM | POA: Diagnosis not present

## 2021-02-24 LAB — CMP14+EGFR
ALT: 8 IU/L (ref 0–32)
AST: 12 IU/L (ref 0–40)
Albumin/Globulin Ratio: 1.6 (ref 1.2–2.2)
Albumin: 4 g/dL (ref 3.6–4.6)
Alkaline Phosphatase: 48 IU/L (ref 44–121)
BUN/Creatinine Ratio: 12 (ref 12–28)
BUN: 27 mg/dL (ref 8–27)
Bilirubin Total: 0.4 mg/dL (ref 0.0–1.2)
CO2: 22 mmol/L (ref 20–29)
Calcium: 8.8 mg/dL (ref 8.7–10.3)
Chloride: 105 mmol/L (ref 96–106)
Creatinine, Ser: 2.23 mg/dL — ABNORMAL HIGH (ref 0.57–1.00)
Globulin, Total: 2.5 g/dL (ref 1.5–4.5)
Glucose: 69 mg/dL (ref 65–99)
Potassium: 4.8 mmol/L (ref 3.5–5.2)
Sodium: 142 mmol/L (ref 134–144)
Total Protein: 6.5 g/dL (ref 6.0–8.5)
eGFR: 22 mL/min/{1.73_m2} — ABNORMAL LOW (ref >59)

## 2021-02-24 LAB — CBC WITH DIFFERENTIAL
Basophils Absolute: 0.1 10*3/uL (ref 0.0–0.2)
Basos: 1 %
EOS (ABSOLUTE): 0.3 10*3/uL (ref 0.0–0.4)
Eos: 3 %
Hematocrit: 36.8 % (ref 34.0–46.6)
Hemoglobin: 12.2 g/dL (ref 11.1–15.9)
Immature Grans (Abs): 0 10*3/uL (ref 0.0–0.1)
Immature Granulocytes: 0 %
Lymphocytes Absolute: 1.7 10*3/uL (ref 0.7–3.1)
Lymphs: 21 %
MCH: 31.9 pg (ref 26.6–33.0)
MCHC: 33.2 g/dL (ref 31.5–35.7)
MCV: 96 fL (ref 79–97)
Monocytes Absolute: 0.7 10*3/uL (ref 0.1–0.9)
Monocytes: 9 %
Neutrophils Absolute: 5.3 10*3/uL (ref 1.4–7.0)
Neutrophils: 66 %
RBC: 3.82 x10E6/uL (ref 3.77–5.28)
RDW: 12.1 % (ref 11.7–15.4)
WBC: 8 10*3/uL (ref 3.4–10.8)

## 2021-02-24 LAB — LIPID PANEL
Chol/HDL Ratio: 2.1 ratio (ref 0.0–4.4)
Cholesterol, Total: 141 mg/dL (ref 100–199)
HDL: 66 mg/dL (ref >39)
LDL Chol Calc (NIH): 60 mg/dL (ref 0–99)
Triglycerides: 77 mg/dL (ref 0–149)
VLDL Cholesterol Cal: 15 mg/dL (ref 5–40)

## 2021-02-24 LAB — TSH: TSH: 9.03 u[IU]/mL — ABNORMAL HIGH (ref 0.450–4.500)

## 2021-02-24 LAB — VITAMIN D 25 HYDROXY (VIT D DEFICIENCY, FRACTURES): Vit D, 25-Hydroxy: 51.8 ng/mL (ref 30.0–100.0)

## 2021-02-24 LAB — HEMOGLOBIN A1C
Est. average glucose Bld gHb Est-mCnc: 120 mg/dL
Hgb A1c MFr Bld: 5.8 % — ABNORMAL HIGH (ref 4.8–5.6)

## 2021-02-28 ENCOUNTER — Ambulatory Visit (INDEPENDENT_AMBULATORY_CARE_PROVIDER_SITE_OTHER): Payer: Medicare HMO | Admitting: Family Medicine

## 2021-02-28 ENCOUNTER — Other Ambulatory Visit: Payer: Self-pay

## 2021-02-28 ENCOUNTER — Encounter: Payer: Self-pay | Admitting: Family Medicine

## 2021-02-28 VITALS — BP 138/60 | HR 74 | Temp 98.2°F | Resp 18 | Ht 62.0 in | Wt 158.0 lb

## 2021-02-28 DIAGNOSIS — N184 Chronic kidney disease, stage 4 (severe): Secondary | ICD-10-CM

## 2021-02-28 DIAGNOSIS — I10 Essential (primary) hypertension: Secondary | ICD-10-CM | POA: Diagnosis not present

## 2021-02-28 DIAGNOSIS — K222 Esophageal obstruction: Secondary | ICD-10-CM

## 2021-02-28 DIAGNOSIS — Z1211 Encounter for screening for malignant neoplasm of colon: Secondary | ICD-10-CM

## 2021-02-28 DIAGNOSIS — E785 Hyperlipidemia, unspecified: Secondary | ICD-10-CM | POA: Diagnosis not present

## 2021-02-28 DIAGNOSIS — G8929 Other chronic pain: Secondary | ICD-10-CM

## 2021-02-28 DIAGNOSIS — R2681 Unsteadiness on feet: Secondary | ICD-10-CM

## 2021-02-28 DIAGNOSIS — E038 Other specified hypothyroidism: Secondary | ICD-10-CM | POA: Diagnosis not present

## 2021-02-28 DIAGNOSIS — K219 Gastro-esophageal reflux disease without esophagitis: Secondary | ICD-10-CM

## 2021-02-28 DIAGNOSIS — R634 Abnormal weight loss: Secondary | ICD-10-CM | POA: Diagnosis not present

## 2021-02-28 MED ORDER — TRAZODONE HCL 100 MG PO TABS: 100.0000 mg | ORAL_TABLET | Freq: Every day | ORAL | 5 refills | Status: DC

## 2021-02-28 MED ORDER — TIZANIDINE HCL 4 MG PO TABS: 4.0000 mg | ORAL_TABLET | Freq: Two times a day (BID) | ORAL | 5 refills | Status: DC

## 2021-02-28 MED ORDER — LEVOTHYROXINE SODIUM 100 MCG PO TABS: ORAL_TABLET | ORAL | 1 refills | Status: DC

## 2021-02-28 MED ORDER — PREDNISONE 10 MG PO TABS: ORAL_TABLET | ORAL | 0 refills | Status: DC

## 2021-02-28 MED ORDER — AMLODIPINE BESYLATE 2.5 MG PO TABS: 2.5000 mg | ORAL_TABLET | Freq: Every day | ORAL | 5 refills | Status: DC

## 2021-02-28 MED ORDER — CALCITRIOL 0.25 MCG PO CAPS: ORAL_CAPSULE | ORAL | 3 refills | Status: DC

## 2021-02-28 NOTE — Patient Instructions (Addendum)
F/U in 12 weeks, call if you need me sooner  Increase synthroid to 1.5 tablets every Mon, Wed, Friday and Saturday, take one daily on Tuesday, Thursday and Sunday  Please send cologuard test this is important, nurse please order   No other change sin medication  Thanks for choosing [facility], we consider it a privelige to serve you.

## 2021-03-03 ENCOUNTER — Other Ambulatory Visit: Payer: Self-pay | Admitting: Family Medicine

## 2021-03-07 ENCOUNTER — Encounter: Payer: Self-pay | Admitting: Family Medicine

## 2021-03-07 ENCOUNTER — Other Ambulatory Visit: Payer: Self-pay

## 2021-03-07 DIAGNOSIS — E038 Other specified hypothyroidism: Secondary | ICD-10-CM

## 2021-03-07 DIAGNOSIS — I10 Essential (primary) hypertension: Secondary | ICD-10-CM

## 2021-03-07 MED ORDER — METHADONE HCL 10 MG PO TABS: 10.0000 mg | ORAL_TABLET | Freq: Three times a day (TID) | ORAL | 0 refills | Status: DC

## 2021-03-07 MED ORDER — ESOMEPRAZOLE MAGNESIUM 40 MG PO CPDR: 40.0000 mg | DELAYED_RELEASE_CAPSULE | Freq: Every day | ORAL | 5 refills | Status: DC

## 2021-03-07 MED ORDER — METHADONE HCL 10 MG PO TABS: 10.0000 mg | ORAL_TABLET | Freq: Three times a day (TID) | ORAL | 0 refills | Status: AC

## 2021-03-07 MED ORDER — METHADONE HCL 10 MG PO TABS: ORAL_TABLET | ORAL | 0 refills | Status: AC

## 2021-03-07 NOTE — Assessment & Plan Note (Signed)
Hyperlipidemia:Low fat diet discussed and encouraged.   Lipid Panel  Lab Results  Component Value Date   CHOL 141 02/23/2021   HDL 66 02/23/2021   LDLCALC 60 02/23/2021   TRIG 77 02/23/2021   CHOLHDL 2.1 02/23/2021     Controlled, no change in medication

## 2021-03-07 NOTE — Assessment & Plan Note (Signed)
Stable

## 2021-03-07 NOTE — Progress Notes (Signed)
Katelyn Rowland     MRN: [id number]      DOB: November 04, 1940   HPI Katelyn Rowland is here for follow up and re-evaluation of chronic medical conditions, medication management and review of any available recent lab and radiology data.  Preventive health is updated, specifically  Cancer screening and Immunization. No interest in cancer screening and does not want vaccines. Less interest in life, not depressed, but barely able to do things that she enjoys , which is bothersome to her. Her family is supportive  The PT denies any adverse reactions to current medications since the last visit.  Pain and debility are worsening however no interest in pain management assessment ROS Denies recent fever or chills. Denies sinus pressure, nasal congestion, ear pain or sore throat. Denies chest congestion, productive cough or wheezing. Denies chest pains, palpitations and leg swelling Denies abdominal pain, nausea, vomiting,diarrhea or constipation.   Denies dysuria, frequency, hesitancy or incontinence. . Denies headaches, seizures, numbness, or tingling. Denies uncontrolled  depression, anxiety or insomnia. Denies skin break down or rash.   PE  BP 138/60 (BP Location: Left Arm, Patient Position: Sitting, Cuff Size: Large)   Pulse 74   Temp 98.2 F (36.8 C)   Resp 18   Ht 5\' 2"  (1.575 m)   Wt 158 lb (71.7 kg)   SpO2 95%   BMI 28.90 kg/m   Patient alert and oriented and in no cardiopulmonary distress.  HEENT: No facial asymmetry, EOMI,     Neck decreased ROM .  Chest: Clear to auscultation bilaterally.  CVS: S1, S2 no murmurs, no S3.Regular rate.  ABD: Soft non tender.   Ext: No edema  MS: Markedly reduced  ROM spine, shoulders, hips and knees.  Skin: Intact, no ulcerations or rash noted.  Psych: Good eye contact, normal affect. Memory intact not anxious or depressed appearing.  CNS: CN 2-12 intact, power,  normal throughout.no focal deficits noted.   Assessment & Plan  Essential  hypertension Controlled, no change in medication DASH diet and commitment to daily physical activity for a minimum of 30 minutes discussed and encouraged, as a part of hypertension management. The importance of attaining a healthy weight is also discussed.  BP/Weight 02/28/2021 11/29/2020 08/30/2020 05/31/2020 03/11/2020 01/26/2020 03/18/7516  Systolic BP 001 749 - 449 675 916 384  Diastolic BP 60 59 - 78 81 71 71  Wt. (Lbs) 158 160 167.8 179 183.04 178 178  BMI 28.9 29.26 30.69 32.74 33.48 32.56 32.56       Hypothyroidism Medication adjusted as under corrected, re check in 4 to 6 weeks  Gait instability And safety reviewed.  She has had no falls since the last visit.  CKD (chronic kidney disease) stage 4, GFR 15-29 ml/min (HCC) Stable.  Encounter for chronic pain management The patient's Controlled Substance registry is reviewed and compliance confirmed. Adequacy of  Pain control and level of function is assessed. Medication dosing is adjusted as deemed appropriate. Twelve weeks of medication is prescribed , with a follow up appointment between 11 to 12 weeks .   Hyperlipidemia LDL goal <100 Hyperlipidemia:Low fat diet discussed and encouraged.   Lipid Panel  Lab Results  Component Value Date   CHOL 141 02/23/2021   HDL 66 02/23/2021   LDLCALC 60 02/23/2021   TRIG 77 02/23/2021   CHOLHDL 2.1 02/23/2021     Controlled, no change in medication   GERD with stricture Controlled, no change in medication   Weight loss, unintentional Reports decreased  pleasure in life due to pain and reduced appetite. No interest In cancer screening/ detection, cologuard ordered

## 2021-03-07 NOTE — Assessment & Plan Note (Signed)
Medication adjusted as under corrected, re check in 4 to 6 weeks

## 2021-03-07 NOTE — Assessment & Plan Note (Signed)
Reports decreased pleasure in life due to pain and reduced appetite. No interest In cancer screening/ detection, cologuard ordered

## 2021-03-07 NOTE — Assessment & Plan Note (Signed)
Controlled, no change in medication  

## 2021-03-07 NOTE — Assessment & Plan Note (Signed)
Controlled, no change in medication DASH diet and commitment to daily physical activity for a minimum of 30 minutes discussed and encouraged, as a part of hypertension management. The importance of attaining a healthy weight is also discussed.  BP/Weight 02/28/2021 11/29/2020 08/30/2020 05/31/2020 03/11/2020 01/26/2020 11/13/9276  Systolic BP 004 471 - 580 638 685 488  Diastolic BP 60 59 - 78 81 71 71  Wt. (Lbs) 158 160 167.8 179 183.04 178 178  BMI 28.9 29.26 30.69 32.74 33.48 32.56 32.56

## 2021-03-07 NOTE — Assessment & Plan Note (Signed)
The patient's Controlled Substance registry is reviewed and compliance confirmed. Adequacy of  Pain control and level of function is assessed. Medication dosing is adjusted as deemed appropriate. Twelve weeks of medication is prescribed , with a follow up appointment between 11 to 12 weeks .  

## 2021-03-07 NOTE — Assessment & Plan Note (Signed)
And safety reviewed.  She has had no falls since the last visit.

## 2021-03-10 ENCOUNTER — Other Ambulatory Visit: Payer: Self-pay | Admitting: Family Medicine

## 2021-03-25 NOTE — Addendum Note (Signed)
Addended by: Lonn Georgia on: 03/25/2021 04:13 PM   Modules accepted: Orders, Level of Service, SmartSet

## 2021-04-04 ENCOUNTER — Other Ambulatory Visit: Payer: Self-pay | Admitting: Family Medicine

## 2021-04-04 ENCOUNTER — Telehealth: Payer: Self-pay | Admitting: Family Medicine

## 2021-04-04 MED ORDER — NALOXONE HCL 4 MG/0.1ML NA LIQD: NASAL | 1 refills | Status: AC

## 2021-04-04 NOTE — Telephone Encounter (Signed)
Pls let her know that I have prescribed intranasal narcan in case she accidentally overdoses on her pain meds,advise her to have the  have pharmacist to review  corect use with her also please

## 2021-04-05 ENCOUNTER — Telehealth: Payer: Self-pay

## 2021-04-05 NOTE — Telephone Encounter (Signed)
Called patient and left message for them to return call at the office   

## 2021-04-05 NOTE — Telephone Encounter (Signed)
Pt called and would like to speak to [place] in regards to a medication.

## 2021-04-06 NOTE — Telephone Encounter (Signed)
Duplicate message- see previous phone note

## 2021-04-07 NOTE — Telephone Encounter (Signed)
Patient aware of narcan spray and wanted to let you know the stool leakage has stopped and that the cologuard was going to be $700 out of pocket and she didn't have that

## 2021-04-08 ENCOUNTER — Other Ambulatory Visit: Payer: Self-pay | Admitting: Family Medicine

## 2021-05-18 ENCOUNTER — Other Ambulatory Visit: Payer: Self-pay | Admitting: Family Medicine

## 2021-05-24 ENCOUNTER — Encounter (INDEPENDENT_AMBULATORY_CARE_PROVIDER_SITE_OTHER): Payer: Self-pay

## 2021-05-24 ENCOUNTER — Other Ambulatory Visit: Payer: Self-pay

## 2021-05-24 ENCOUNTER — Ambulatory Visit (INDEPENDENT_AMBULATORY_CARE_PROVIDER_SITE_OTHER): Payer: Medicare HMO | Admitting: Family Medicine

## 2021-05-24 ENCOUNTER — Encounter: Payer: Self-pay | Admitting: Family Medicine

## 2021-05-24 VITALS — BP 156/74 | HR 71 | Resp 18 | Ht 62.0 in | Wt 156.1 lb

## 2021-05-24 DIAGNOSIS — Z9181 History of falling: Secondary | ICD-10-CM

## 2021-05-24 DIAGNOSIS — E038 Other specified hypothyroidism: Secondary | ICD-10-CM

## 2021-05-24 DIAGNOSIS — E663 Overweight: Secondary | ICD-10-CM | POA: Diagnosis not present

## 2021-05-24 DIAGNOSIS — G8929 Other chronic pain: Secondary | ICD-10-CM | POA: Diagnosis not present

## 2021-05-24 DIAGNOSIS — I10 Essential (primary) hypertension: Secondary | ICD-10-CM

## 2021-05-24 DIAGNOSIS — Z2821 Immunization not carried out because of patient refusal: Secondary | ICD-10-CM | POA: Diagnosis not present

## 2021-05-24 DIAGNOSIS — M2550 Pain in unspecified joint: Secondary | ICD-10-CM | POA: Diagnosis not present

## 2021-05-24 MED ORDER — ROSUVASTATIN CALCIUM 20 MG PO TABS: ORAL_TABLET | ORAL | 3 refills | Status: DC

## 2021-05-24 MED ORDER — METHADONE HCL 10 MG PO TABS: 10.0000 mg | ORAL_TABLET | Freq: Three times a day (TID) | ORAL | 0 refills | Status: DC

## 2021-05-24 MED ORDER — METHADONE HCL 10 MG PO TABS
10.0000 mg | ORAL_TABLET | Freq: Three times a day (TID) | ORAL | 0 refills | Status: AC
Start: 2021-07-05 — End: 2021-08-04

## 2021-05-24 MED ORDER — PREDNISONE 10 MG PO TABS: ORAL_TABLET | ORAL | 0 refills | Status: DC

## 2021-05-24 MED ORDER — METHADONE HCL 10 MG PO TABS: ORAL_TABLET | ORAL | 0 refills | Status: AC

## 2021-05-24 NOTE — Assessment & Plan Note (Signed)
  Patient re-educated about  the importance of commitment to a  minimum of 150 minutes of exercise per week as able.  The importance of healthy food choices with portion control discussed, as well as eating regularly and within a 12 hour window most days. The need to choose "clean , green" food 50 to 75% of the time is discussed, as well as to make water the primary drink and set a goal of 64 ounces water daily.    Weight /BMI 05/24/2021 02/28/2021 11/29/2020  WEIGHT 156 lb 1.3 oz 158 lb 160 lb  HEIGHT 5\' 2"  5\' 2"  5\' 2"   BMI 28.55 kg/m2 28.9 kg/m2 29.26 kg/m2

## 2021-05-24 NOTE — Assessment & Plan Note (Signed)
updated lab needed, under corrected when last checked

## 2021-05-24 NOTE — Assessment & Plan Note (Signed)
The patient's Controlled Substance registry is reviewed and compliance confirmed. Adequacy of  Pain control and level of function is assessed. Medication dosing is adjusted as deemed appropriate. Twelve weeks of medication is prescribed , with a follow up appointment between 11 to 12 weeks .  

## 2021-05-24 NOTE — Assessment & Plan Note (Signed)
DASH diet and commitment to daily physical activity for a minimum of 30 minutes discussed and encouraged, as a part of hypertension management. The importance of attaining a healthy weight is also discussed.  BP/Weight 05/24/2021 02/28/2021 11/29/2020 08/30/2020 05/31/2020 03/11/2020 9/59/7471  Systolic BP 855 015 868 - 257 493 552  Diastolic BP 74 60 59 - 78 81 71  Wt. (Lbs) 156.08 158 160 167.8 179 183.04 178  BMI 28.55 28.9 29.26 30.69 32.74 33.48 32.56  Has not taken medication today, states makes her urinate excessively so holds off when has OV, needs to take med regularly, will re eval at next visit, no med change today

## 2021-05-24 NOTE — Progress Notes (Signed)
Katelyn Rowland     MRN: [id number]      DOB: 04-Nov-1940   HPI Katelyn Rowland is here for follow up and re-evaluation of chronic medical conditions, medication management and review of any available recent lab and radiology data.  Preventive health is updated, specifically  Cancer screening and Immunization.   Refuses cancer screening and immunizations that are indicated Questions or concerns regarding consultations or procedures which the PT has had in the interim are  addressed. The PT denies any adverse reactions to current medications since the last visit.  Improvement in quality of life, gets home cleaned once/ month and has all food pre delivered   ROS Denies recent fever or chills. Denies sinus pressure, nasal congestion, ear pain or sore throat. Denies chest congestion, productive cough or wheezing. Denies chest pains, palpitations and leg swelling Denies abdominal pain, nausea, vomiting,diarrhea or constipation. Denies fecal smearing/  Denies dysuria, frequency, hesitancy or incontinence. Chronic  joint pain, swelling and limitation in mobility.Adequately controlled on current meds Denies headaches, seizures,  chronic lower extremity numbness,  and tingling. Denies depression, anxiety or insomnia. Denies skin break down or rash.   PE  BP (!) 156/74   Pulse 71   Resp 18   Ht 5\' 2"  (1.575 m)   Wt 156 lb 1.3 oz (70.8 kg)   SpO2 95%   BMI 28.55 kg/m   Patient alert and oriented and in no cardiopulmonary distress.  HEENT: No facial asymmetry, EOMI,     Neck decreased ROM .  Chest: Clear to auscultation bilaterally.  CVS: S1, S2 no murmurs, no S3.Regular rate.  ABD: Soft non tender.   Ext: No edema  MS: decreased  ROM spine, shoulders, hips and knees.  Skin: Intact, no ulcerations or rash noted.  Psych: Good eye contact, normal affect. Memory intact not anxious or depressed appearing.  CNS: CN 2-12 intact, power,  normal throughout.no focal deficits  noted.   Assessment & Plan  Hypothyroidism updated lab needed, under corrected when last checked  Encounter for chronic pain management The patient's Controlled Substance registry is reviewed and compliance confirmed. Adequacy of  Pain control and level of function is assessed. Medication dosing is adjusted as deemed appropriate. Twelve weeks of medication is prescribed , with a follow up appointment between 11 to 12 weeks .   Essential hypertension DASH diet and commitment to daily physical activity for a minimum of 30 minutes discussed and encouraged, as a part of hypertension management. The importance of attaining a healthy weight is also discussed.  BP/Weight 05/24/2021 02/28/2021 11/29/2020 08/30/2020 05/31/2020 03/11/2020 9/37/9024  Systolic BP 097 353 299 - 242 683 419  Diastolic BP 74 60 59 - 78 81 71  Wt. (Lbs) 156.08 158 160 167.8 179 183.04 178  BMI 28.55 28.9 29.26 30.69 32.74 33.48 32.56  Has not taken medication today, states makes her urinate excessively so holds off when has OV, needs to take med regularly, will re eval at next visit, no med change today     Generalized joint pain Controlled on current regimes, continue same  At high risk for falls Home safety and fall risk reduction discussed, no falls reported since last visit and in the past 1 year   Overweight (BMI 25.0-29.9)  Patient re-educated about  the importance of commitment to a  minimum of 150 minutes of exercise per week as able.  The importance of healthy food choices with portion control discussed, as well as eating regularly and within  a 12 hour window most days. The need to choose "clean , green" food 50 to 75% of the time is discussed, as well as to make water the primary drink and set a goal of 64 ounces water daily.    Weight /BMI 05/24/2021 02/28/2021 11/29/2020  WEIGHT 156 lb 1.3 oz 158 lb 160 lb  HEIGHT 5\' 2"  5\' 2"  5\' 2"   BMI 28.55 kg/m2 28.9 kg/m2 29.26 kg/m2      Refused H1N1  influenza virus immunization Refuses flu vaccine , again offered at the visit

## 2021-05-24 NOTE — Patient Instructions (Signed)
F/U in 13 weeks, call if you need me before  Labs today  Careful not to fall  Best for 2023  Please take medications every day especially your blood pressure meds the day you come to the office  Thanks for choosing [facility], we consider it a privelige to serve you.

## 2021-05-24 NOTE — Assessment & Plan Note (Signed)
Controlled on current regimes, continue same

## 2021-05-24 NOTE — Assessment & Plan Note (Signed)
Refuses flu vaccine , again offered at the visit

## 2021-05-24 NOTE — Assessment & Plan Note (Signed)
Home safety and fall risk reduction discussed, no falls reported since last visit and in the past 1 year

## 2021-05-25 ENCOUNTER — Other Ambulatory Visit: Payer: Self-pay | Admitting: Family Medicine

## 2021-05-25 LAB — BMP8+EGFR
BUN/Creatinine Ratio: 12 (ref 12–28)
BUN: 28 mg/dL — ABNORMAL HIGH (ref 8–27)
CO2: 20 mmol/L (ref 20–29)
Calcium: 9.1 mg/dL (ref 8.7–10.3)
Chloride: 103 mmol/L (ref 96–106)
Creatinine, Ser: 2.3 mg/dL — ABNORMAL HIGH (ref 0.57–1.00)
Glucose: 68 mg/dL — ABNORMAL LOW (ref 70–99)
Potassium: 5 mmol/L (ref 3.5–5.2)
Sodium: 140 mmol/L (ref 134–144)
eGFR: 21 mL/min/{1.73_m2} — ABNORMAL LOW (ref >59)

## 2021-05-25 LAB — TSH: TSH: 0.37 u[IU]/mL — ABNORMAL LOW (ref 0.450–4.500)

## 2021-05-25 MED ORDER — LEVOTHYROXINE SODIUM 100 MCG PO TABS: ORAL_TABLET | ORAL | 3 refills | Status: DC

## 2021-05-25 NOTE — Progress Notes (Signed)
Synthroid 100

## 2021-05-26 ENCOUNTER — Other Ambulatory Visit: Payer: Self-pay | Admitting: Family Medicine

## 2021-06-01 ENCOUNTER — Telehealth: Payer: Self-pay | Admitting: Family Medicine

## 2021-06-01 NOTE — Telephone Encounter (Signed)
Pt called back to return your call

## 2021-06-03 NOTE — Telephone Encounter (Signed)
Pt aware of results 

## 2021-06-03 NOTE — Telephone Encounter (Signed)
Called pt with her lab results

## 2021-06-03 NOTE — Telephone Encounter (Signed)
Patient returning call. Patient asked for Velna Hatchet to give her a call [phone]

## 2021-06-18 ENCOUNTER — Other Ambulatory Visit: Payer: Self-pay | Admitting: Family Medicine

## 2021-06-18 DIAGNOSIS — N184 Chronic kidney disease, stage 4 (severe): Secondary | ICD-10-CM

## 2021-06-23 ENCOUNTER — Other Ambulatory Visit: Payer: Self-pay | Admitting: Family Medicine

## 2021-07-20 ENCOUNTER — Other Ambulatory Visit: Payer: Self-pay | Admitting: Family Medicine

## 2021-07-20 DIAGNOSIS — N184 Chronic kidney disease, stage 4 (severe): Secondary | ICD-10-CM

## 2021-08-18 ENCOUNTER — Other Ambulatory Visit: Payer: Self-pay | Admitting: Family Medicine

## 2021-08-18 DIAGNOSIS — N184 Chronic kidney disease, stage 4 (severe): Secondary | ICD-10-CM

## 2021-08-23 ENCOUNTER — Ambulatory Visit: Payer: Medicare HMO | Admitting: Family Medicine

## 2021-08-23 ENCOUNTER — Encounter: Payer: Self-pay | Admitting: Family Medicine

## 2021-08-23 ENCOUNTER — Other Ambulatory Visit: Payer: Self-pay

## 2021-08-23 ENCOUNTER — Ambulatory Visit (INDEPENDENT_AMBULATORY_CARE_PROVIDER_SITE_OTHER): Payer: Medicare HMO | Admitting: Family Medicine

## 2021-08-23 DIAGNOSIS — I10 Essential (primary) hypertension: Secondary | ICD-10-CM

## 2021-08-23 DIAGNOSIS — E785 Hyperlipidemia, unspecified: Secondary | ICD-10-CM | POA: Diagnosis not present

## 2021-08-23 DIAGNOSIS — Z2882 Immunization not carried out because of caregiver refusal: Secondary | ICD-10-CM | POA: Diagnosis not present

## 2021-08-23 DIAGNOSIS — R2681 Unsteadiness on feet: Secondary | ICD-10-CM | POA: Diagnosis not present

## 2021-08-23 DIAGNOSIS — M5440 Lumbago with sciatica, unspecified side: Secondary | ICD-10-CM

## 2021-08-23 DIAGNOSIS — E038 Other specified hypothyroidism: Secondary | ICD-10-CM | POA: Diagnosis not present

## 2021-08-23 MED ORDER — METHADONE HCL 10 MG PO TABS: 10.0000 mg | ORAL_TABLET | Freq: Three times a day (TID) | ORAL | 0 refills | Status: DC

## 2021-08-23 MED ORDER — TIZANIDINE HCL 4 MG PO TABS: ORAL_TABLET | ORAL | 5 refills | Status: DC

## 2021-08-23 NOTE — Progress Notes (Signed)
Virtual Visit via Telephone Note  I connected with Katelyn Rowland on 08/23/21 at  1:40 PM EST by telephone and verified that I am speaking with the correct person using two identifiers.  Location: Patient: home Provider: office   I discussed the limitations, risks, security and privacy concerns of performing an evaluation and management service by telephone and the availability of in person appointments. I also discussed with the patient that there may be a patient responsible charge related to this service. The patient expressed understanding and agreed to proceed.   History of Present Illness: F/U chronic problems and address any new or current concerns. Review and update medications and allergies. Review recent lab and radiologic data . Update routine health maintainace. Review an encourage improved health habits to include nutrition, exercise and  sleep . No new concerns Needs updated TSH     Observations/Objective: There were no vitals taken for this visit. Good communication with no confusion and intact memory. Alert and oriented x 3 No signs of respiratory distress during speech   Assessment and Plan: Parent refuses immunizations Refuses all immunization   Hypothyroidism Updated lab needed at/ before next visit.   Hyperlipidemia LDL goal <100 Hyperlipidemia:Low fat diet discussed and encouraged.   Lipid Panel  Lab Results  Component Value Date   CHOL 141 02/23/2021   HDL 66 02/23/2021   LDLCALC 60 02/23/2021   TRIG 77 02/23/2021   CHOLHDL 2.1 02/23/2021     Updated lab needed at/ before next visit.   Gait instability Home safety reviewed briefly  Essential hypertension DASH diet and commitment to daily physical activity for a minimum of 30 minutes discussed and encouraged, as a part of hypertension management. The importance of attaining a healthy weight is also discussed. Elevated at last visit has upcoming in office eval  BP/Weight 05/24/2021  02/28/2021 11/29/2020 08/30/2020 05/31/2020 03/11/2020 7/35/3299  Systolic BP 242 683 419 - 622 297 989  Diastolic BP 74 60 59 - 78 81 71  Wt. (Lbs) 156.08 158 160 167.8 179 183.04 178  BMI 28.55 28.9 29.26 30.69 32.74 33.48 32.56       Back pain Registry reviewed and 1 month of pain medication prescribed to be dispensed when due and will last until her next OV    Follow Up Instructions:    I discussed the assessment and treatment plan with the patient. The patient was provided an opportunity to ask questions and all were answered. The patient agreed with the plan and demonstrated an understanding of the instructions.   The patient was advised to call back or seek an in-person evaluation if the symptoms worsen or if the condition fails to improve as anticipated.  I provided 18 minutes of non-face-to-face time during this encounter.   Tula Nakayama, MD

## 2021-08-23 NOTE — Patient Instructions (Signed)
Keep Feb appointment as before, call if you need me sooner  Medications are prescribed as discussed  Please get fasting lipid, cmp and EGFR, hBA1c and TSH next week  Careful not to fall  Best for 2023!!  Thanks for choosing [facility], we consider it a privelige to serve you.

## 2021-08-27 ENCOUNTER — Encounter: Payer: Self-pay | Admitting: Family Medicine

## 2021-08-27 DIAGNOSIS — Z2882 Immunization not carried out because of caregiver refusal: Secondary | ICD-10-CM | POA: Insufficient documentation

## 2021-08-27 MED ORDER — METHADONE HCL 10 MG PO TABS: ORAL_TABLET | ORAL | 0 refills | Status: DC

## 2021-08-27 NOTE — Assessment & Plan Note (Signed)
Updated lab needed at/ before next visit.   

## 2021-08-27 NOTE — Assessment & Plan Note (Signed)
Refuses all immunization

## 2021-08-27 NOTE — Assessment & Plan Note (Signed)
Hyperlipidemia:Low fat diet discussed and encouraged.   Lipid Panel  Lab Results  Component Value Date   CHOL 141 02/23/2021   HDL 66 02/23/2021   LDLCALC 60 02/23/2021   TRIG 77 02/23/2021   CHOLHDL 2.1 02/23/2021     Updated lab needed at/ before next visit.

## 2021-08-27 NOTE — Assessment & Plan Note (Signed)
Registry reviewed and 1 month of pain medication prescribed to be dispensed when due and will last until her next OV

## 2021-08-27 NOTE — Assessment & Plan Note (Signed)
Home safety reviewed briefly

## 2021-08-27 NOTE — Assessment & Plan Note (Addendum)
DASH diet and commitment to daily physical activity for a minimum of 30 minutes discussed and encouraged, as a part of hypertension management. The importance of attaining a healthy weight is also discussed. Elevated at last visit has upcoming in office eval  BP/Weight 05/24/2021 02/28/2021 11/29/2020 08/30/2020 05/31/2020 03/11/2020 9/82/8675  Systolic BP 198 242 998 - 069 996 722  Diastolic BP 74 60 59 - 78 81 71  Wt. (Lbs) 156.08 158 160 167.8 179 183.04 178  BMI 28.55 28.9 29.26 30.69 32.74 33.48 32.56

## 2021-09-06 ENCOUNTER — Other Ambulatory Visit: Payer: Self-pay

## 2021-09-06 DIAGNOSIS — E785 Hyperlipidemia, unspecified: Secondary | ICD-10-CM

## 2021-09-06 DIAGNOSIS — I10 Essential (primary) hypertension: Secondary | ICD-10-CM | POA: Diagnosis not present

## 2021-09-06 DIAGNOSIS — R7303 Prediabetes: Secondary | ICD-10-CM

## 2021-09-07 LAB — CMP14+EGFR
ALT: 6 IU/L (ref 0–32)
AST: 15 IU/L (ref 0–40)
Albumin/Globulin Ratio: 1.8 (ref 1.2–2.2)
Albumin: 4.2 g/dL (ref 3.6–4.6)
Alkaline Phosphatase: 60 IU/L (ref 44–121)
BUN/Creatinine Ratio: 13 (ref 12–28)
BUN: 32 mg/dL — ABNORMAL HIGH (ref 8–27)
Bilirubin Total: 0.3 mg/dL (ref 0.0–1.2)
CO2: 22 mmol/L (ref 20–29)
Calcium: 9.4 mg/dL (ref 8.7–10.3)
Chloride: 104 mmol/L (ref 96–106)
Creatinine, Ser: 2.4 mg/dL — ABNORMAL HIGH (ref 0.57–1.00)
Globulin, Total: 2.4 g/dL (ref 1.5–4.5)
Glucose: 106 mg/dL — ABNORMAL HIGH (ref 70–99)
Potassium: 5.2 mmol/L (ref 3.5–5.2)
Sodium: 140 mmol/L (ref 134–144)
Total Protein: 6.6 g/dL (ref 6.0–8.5)
eGFR: 20 mL/min/{1.73_m2} — ABNORMAL LOW (ref >59)

## 2021-09-07 LAB — LIPID PANEL
Chol/HDL Ratio: 2.5 ratio (ref 0.0–4.4)
Cholesterol, Total: 129 mg/dL (ref 100–199)
HDL: 52 mg/dL (ref >39)
LDL Chol Calc (NIH): 56 mg/dL (ref 0–99)
Triglycerides: 118 mg/dL (ref 0–149)
VLDL Cholesterol Cal: 21 mg/dL (ref 5–40)

## 2021-09-07 LAB — HEMOGLOBIN A1C
Est. average glucose Bld gHb Est-mCnc: 108 mg/dL
Hgb A1c MFr Bld: 5.4 % (ref 4.8–5.6)

## 2021-09-07 LAB — TSH: TSH: 0.151 u[IU]/mL — ABNORMAL LOW (ref 0.450–4.500)

## 2021-09-15 ENCOUNTER — Other Ambulatory Visit: Payer: Self-pay | Admitting: Family Medicine

## 2021-09-15 DIAGNOSIS — N184 Chronic kidney disease, stage 4 (severe): Secondary | ICD-10-CM

## 2021-09-23 ENCOUNTER — Encounter: Payer: Medicare HMO | Admitting: Family Medicine

## 2021-10-03 ENCOUNTER — Other Ambulatory Visit: Payer: Self-pay | Admitting: Family Medicine

## 2021-10-03 ENCOUNTER — Telehealth: Payer: Self-pay

## 2021-10-03 MED ORDER — METHADONE HCL 10 MG PO TABS: ORAL_TABLET | ORAL | 0 refills | Status: DC

## 2021-10-03 NOTE — Telephone Encounter (Signed)
Need med refill to be delivered  methadone (DOLOPHINE) 10 MG tablet    Pharmacy: [facility]

## 2021-10-06 ENCOUNTER — Ambulatory Visit (INDEPENDENT_AMBULATORY_CARE_PROVIDER_SITE_OTHER): Payer: Medicare HMO | Admitting: Family Medicine

## 2021-10-06 ENCOUNTER — Encounter (INDEPENDENT_AMBULATORY_CARE_PROVIDER_SITE_OTHER): Payer: Self-pay

## 2021-10-06 ENCOUNTER — Other Ambulatory Visit: Payer: Self-pay

## 2021-10-06 VITALS — BP 147/66 | HR 70 | Resp 16 | Ht 62.0 in | Wt 150.4 lb

## 2021-10-06 DIAGNOSIS — G8929 Other chronic pain: Secondary | ICD-10-CM

## 2021-10-06 DIAGNOSIS — M7989 Other specified soft tissue disorders: Secondary | ICD-10-CM | POA: Diagnosis not present

## 2021-10-06 DIAGNOSIS — Z0001 Encounter for general adult medical examination with abnormal findings: Secondary | ICD-10-CM | POA: Insufficient documentation

## 2021-10-06 DIAGNOSIS — I10 Essential (primary) hypertension: Secondary | ICD-10-CM | POA: Diagnosis not present

## 2021-10-06 MED ORDER — AMLODIPINE BESYLATE 5 MG PO TABS: 5.0000 mg | ORAL_TABLET | Freq: Every day | ORAL | 3 refills | Status: DC

## 2021-10-06 NOTE — Patient Instructions (Addendum)
F/U in last week in April , re evaluate blood pressure and pain management contract at that visit, call if you need me sooner ? ?New higher dose of amlodipine 5 mg one daily, as your blood pressure is too high ? ?We will schedule an [place] of your left leg to evaluate pain and swelling, if you have significant increase in either before the study is done , or if you start having shortness of breath you need to go to the ED.  ? ?Annual exam in `1  year ? ?Non fasting chem 7 and EGFr, TSH   last week I n April ? ?Careful not to fall ? ?Thanks for choosing [facility], we consider it a privelige to serve you. ? ?

## 2021-10-06 NOTE — Progress Notes (Signed)
? ? ?  Katelyn Rowland     MRN: [id number]      DOB: 07-03-1941 ? ?HPI: ?Patient is in for annual physical exam. ?Uncontrolled blood pressure is addressed. ?Left leg pain and swelling increased in past 2 weeks, h/o chronic DVT, no new shortness of breath, cough or hemoptysis ?C/o chronic generalized pain all the time , no interest in pain management ?Recent labs,  are reviewed. ?Immunization is reviewed , and  updated if needed. ? ? ?PE: ?BP (!) 147/66   Pulse 70   Resp 16   Ht 5\' 2"  (1.575 m)   Wt 150 lb 6.4 oz (68.2 kg)   SpO2 94%   BMI 27.51 kg/m?  ? ?Pleasant  female, alert and oriented x 3, in no cardio-pulmonary distress. ?Afebrile. ?HEENT ?No facial trauma or asymetry. Sinuses non tender.  ?Extra occullar muscles intact.Marland Kitchen ?External ears normal, . ?Neck: decreased ROM, no adenopathy,JVD or thyromegaly.No bruits. ? ?Chest: ?Clear to ascultation bilaterally.No crackles or wheezes.Decreased air entry throughout ?Non tender to palpation ? ?Cardiovascular system; ?Heart sounds normal,  S1 and  S2 ,no S3.  No murmur, or thrill. ? ?Peripheral pulses normal. ? ?Abdomen: ?Soft, non tender. ? ? ?. ? ? ?Musculoskeletal exam: ?Markedly decreased ROM of spine, hips , shoulders and knees. ?deformity ,swelling or crepitus noted. muscle wasting no atrophy.  ?Left calf swollen and tender ? ?Neurologic: ?Cranial nerves 2 to 12 intact. ?Power, tone reduced  throughout. ?disturbance in gait. No tremor.Requires assistive device for safe mobility ? ?Skin: ?Intact, no ulceration, erythema , scaling or rash noted. ?Pigmentation normal throughout ? ?Psych; ?Normal mood and affect. Judgement and concentration normal ? ? ?Assessment & Plan:  ?Annual visit for general adult medical examination with abnormal findings ?Annual exam as documented. ?Counseling done  re healthy lifestyle involving commitment to 150 minutes exercise per week, heart healthy diet, and attaining healthy weight.The importance of adequate sleep also  discussed. ?Regular seat belt use and home safety, is also discussed. ?Changes in health habits are decided on by the patient with goals and time frames  set for achieving them. ?Immunization and cancer screening needs are specifically addressed at this visit. ? ? ?Essential hypertension ?Uncontrolled, increase amlodipine to 5 mg daily, re eva;l in 2 months ? ? ?Left leg swelling ?Venous doppler to r/o clot , at increased risk due to sedentary lifestyle and past clot ? ?Encounter for chronic pain management ?The patient's Controlled Substance registry is reviewed and compliance confirmed. ?Adequacy of  Pain control and level of function is assessed. ?Medication dosing is adjusted as deemed appropriate. ?Twelve weeks of medication is prescribed , with a follow up appointment between 6 to 8  weeks . ? ? ? ?

## 2021-10-06 NOTE — Assessment & Plan Note (Signed)
Uncontrolled, increase amlodipine to 5 mg daily, re eva;l in 2 months ? ?

## 2021-10-06 NOTE — Assessment & Plan Note (Signed)

## 2021-10-10 ENCOUNTER — Ambulatory Visit (HOSPITAL_COMMUNITY)
Admission: RE | Admit: 2021-10-10 | Discharge: 2021-10-10 | Disposition: A | Discharge status: Home / Self Care | Payer: Medicare HMO | Source: Ambulatory Visit | Attending: Family Medicine | Admitting: Family Medicine

## 2021-10-10 ENCOUNTER — Other Ambulatory Visit: Payer: Self-pay

## 2021-10-10 DIAGNOSIS — M7989 Other specified soft tissue disorders: Secondary | ICD-10-CM | POA: Insufficient documentation

## 2021-10-10 DIAGNOSIS — R600 Localized edema: Secondary | ICD-10-CM | POA: Diagnosis not present

## 2021-10-10 DIAGNOSIS — I82412 Acute embolism and thrombosis of left femoral vein: Secondary | ICD-10-CM | POA: Diagnosis not present

## 2021-10-10 DIAGNOSIS — M79605 Pain in left leg: Secondary | ICD-10-CM | POA: Diagnosis not present

## 2021-10-12 ENCOUNTER — Other Ambulatory Visit: Payer: Self-pay | Admitting: Family Medicine

## 2021-10-12 DIAGNOSIS — N184 Chronic kidney disease, stage 4 (severe): Secondary | ICD-10-CM

## 2021-10-17 ENCOUNTER — Encounter: Payer: Self-pay | Admitting: Family Medicine

## 2021-10-17 MED ORDER — METHADONE HCL 10 MG PO TABS: ORAL_TABLET | ORAL | 0 refills | Status: DC

## 2021-10-17 NOTE — Assessment & Plan Note (Addendum)
The patient's Controlled Substance registry is reviewed and compliance confirmed. ?Adequacy of  Pain control and level of function is assessed. ?Medication dosing is adjusted as deemed appropriate. ?Twelve weeks of medication is prescribed , with a follow up appointment between 6 to 8  weeks . ? ?

## 2021-10-17 NOTE — Assessment & Plan Note (Signed)
Venous doppler to r/o clot , at increased risk due to sedentary lifestyle and past clot ?

## 2021-11-10 ENCOUNTER — Other Ambulatory Visit: Payer: Self-pay | Admitting: Family Medicine

## 2021-11-10 DIAGNOSIS — N184 Chronic kidney disease, stage 4 (severe): Secondary | ICD-10-CM

## 2021-11-24 DIAGNOSIS — I10 Essential (primary) hypertension: Secondary | ICD-10-CM | POA: Diagnosis not present

## 2021-11-25 ENCOUNTER — Other Ambulatory Visit: Payer: Self-pay | Admitting: Family Medicine

## 2021-11-25 LAB — BMP8+EGFR
BUN/Creatinine Ratio: 14 (ref 12–28)
BUN: 36 mg/dL — ABNORMAL HIGH (ref 8–27)
CO2: 24 mmol/L (ref 20–29)
Calcium: 9 mg/dL (ref 8.7–10.3)
Chloride: 101 mmol/L (ref 96–106)
Creatinine, Ser: 2.51 mg/dL — ABNORMAL HIGH (ref 0.57–1.00)
Glucose: 109 mg/dL — ABNORMAL HIGH (ref 70–99)
Potassium: 4.2 mmol/L (ref 3.5–5.2)
Sodium: 140 mmol/L (ref 134–144)
eGFR: 19 mL/min/{1.73_m2} — ABNORMAL LOW (ref >59)

## 2021-11-25 LAB — TSH: TSH: 92.6 u[IU]/mL — ABNORMAL HIGH (ref 0.450–4.500)

## 2021-11-25 MED ORDER — LEVOTHYROXINE SODIUM 300 MCG PO TABS: 300.0000 ug | ORAL_TABLET | Freq: Every day | ORAL | 3 refills | Status: DC

## 2021-11-30 ENCOUNTER — Ambulatory Visit (INDEPENDENT_AMBULATORY_CARE_PROVIDER_SITE_OTHER): Payer: Medicare HMO | Admitting: Family Medicine

## 2021-11-30 ENCOUNTER — Encounter: Payer: Self-pay | Admitting: Family Medicine

## 2021-11-30 VITALS — BP 146/76 | HR 70 | Resp 16 | Ht 62.0 in | Wt 155.0 lb

## 2021-11-30 DIAGNOSIS — Z2882 Immunization not carried out because of caregiver refusal: Secondary | ICD-10-CM | POA: Diagnosis not present

## 2021-11-30 DIAGNOSIS — M2550 Pain in unspecified joint: Secondary | ICD-10-CM | POA: Diagnosis not present

## 2021-11-30 DIAGNOSIS — G8929 Other chronic pain: Secondary | ICD-10-CM

## 2021-11-30 DIAGNOSIS — E038 Other specified hypothyroidism: Secondary | ICD-10-CM | POA: Diagnosis not present

## 2021-11-30 DIAGNOSIS — I10 Essential (primary) hypertension: Secondary | ICD-10-CM

## 2021-11-30 DIAGNOSIS — E785 Hyperlipidemia, unspecified: Secondary | ICD-10-CM

## 2021-11-30 DIAGNOSIS — Z9181 History of falling: Secondary | ICD-10-CM

## 2021-11-30 MED ORDER — AMLODIPINE BESYLATE 2.5 MG PO TABS: 2.5000 mg | ORAL_TABLET | Freq: Every day | ORAL | 5 refills | Status: DC

## 2021-11-30 MED ORDER — METHADONE HCL 10 MG PO TABS: ORAL_TABLET | ORAL | 0 refills | Status: DC

## 2021-11-30 MED ORDER — METHADONE HCL 10 MG PO TABS
ORAL_TABLET | ORAL | 0 refills | Status: DC
Start: 2021-12-30 — End: 2022-04-20

## 2021-11-30 NOTE — Patient Instructions (Addendum)
F/U in office week of June 19, pt needs to bring medication ? ?Please check, you need to be taking amlodipine 2.5 mg AND amlodipine 5 mg tablets every day for your blood pressure. Still high and I think you are niot taking the 2.5 mg , nurse is correcting thisd ? ?Non fasting CBC, chem 7 and EGFr and TSH 5 days before June appointment ? ?Thanks for choosing [facility], we consider it a privelige to serve you. ? ?

## 2021-12-04 ENCOUNTER — Encounter: Payer: Self-pay | Admitting: Family Medicine

## 2021-12-04 NOTE — Assessment & Plan Note (Signed)
The patient's Controlled Substance registry is reviewed and compliance confirmed. Adequacy of  Pain control and level of function is assessed. Medication dosing is adjusted as deemed appropriate. Twelve weeks of medication is prescribed , with a follow up appointment between 11 to 12 weeks .  

## 2021-12-04 NOTE — Progress Notes (Signed)
? ?Katelyn Rowland     MRN: [id number]      DOB: [birth year] ? ? ?HPI ?Ms. Bergevin is here for follow up and re-evaluation of chronic medical conditions, medication management and review of any available recent lab and radiology data.  ?Thyroid lab test markedly abnormal, med adjustment made and will follow closely ?Blood pressure elevated despite recent med adjustment, pharmacy failed to correctly fill script  ? ?Refuses cancer screening and immunization.   ?The PT denies any adverse reactions to current medications since the last visit.  ?Feels cold and sluggish as a result of under corrected thyroid ? ?ROS ?Denies recent fever s. ?Denies sinus pressure, nasal congestion, ear pain or sore throat. ?Denies chest congestion, productive cough or wheezing. ?Denies chest pains, palpitations and leg swelling ?Denies abdominal pain, nausea, vomiting,diarrhea or constipation.   ?Denies dysuria, frequency, hesitancy or incontinence. ?Chronic  joint pain,  and limitation in mobility. ?Denies headaches, seizures, numbness, or tingling. ?Denies uncontrolled depression, anxiety or insomnia. ?Denies skin break down or rash. ? ? ?PE ? ?BP (!) 146/76   Pulse 70   Resp 16   Ht '5\' 2"'$  (1.575 m)   Wt 155 lb (70.3 kg)   SpO2 95%   BMI 28.35 kg/m?  ? ?Patient alert and oriented and in no cardiopulmonary distress. ? ?HEENT: No facial asymmetry, EOMI,     Neck decreased ROM . ? ?Chest: Clear to auscultation bilaterally. ? ?CVS: S1, S2 no murmurs, no S3.Regular rate. ? ?ABD: Soft non tender.  ? ?Ext: No edema ? ?MS: decreased  ROM spine, shoulders, hips and knees. ? ?Skin: Intact, no ulcerations or rash noted. ? ?Psych: Good eye contact, normal affect. Memory intact not anxious or depressed appearing. ? ?CNS: CN 2-12 intact, power,  normal throughout.no focal deficits noted. ? ? ?Assessment & Plan ? ?Essential hypertension ?Uncontrolled, needs to take prescribed med dose, pharmacy also contacted as they deliver her meds ?DASH diet and  commitment to daily physical activity for a minimum of 30 minutes discussed and encouraged, as a part of hypertension management. ?The importance of attaining a healthy weight is also discussed. ? ? ?  11/30/2021  ?  2:03 PM 10/06/2021  ?  2:05 PM 05/24/2021  ?  1:33 PM 02/28/2021  ?  1:59 PM 11/29/2020  ?  1:36 PM 08/30/2020  ?  2:11 PM 05/31/2020  ?  1:24 PM  ?BP/Weight  ?Systolic BP 440 347 425 956 110  138  ?Diastolic BP 76 66 74 60 59  78  ?Wt. (Lbs) 155 150.4 156.08 158 160 167.8   ?BMI 28.35 kg/m2 27.51 kg/m2 28.55 kg/m2 28.9 kg/m2 29.26 kg/m2 30.69 kg/m2   ? ? ? ?Review in 8 weeks ? ?Encounter for chronic pain management ?The patient's Controlled Substance registry is reviewed and compliance confirmed. ?Adequacy of  Pain control and level of function is assessed. ?Medication dosing is adjusted as deemed appropriate. ?Twelve weeks of medication is prescribed , with a follow up appointment between 11 to 12 weeks . ? ? ?Generalized joint pain ?Unchanged, management a before ? ?Hypothyroidism ?Under corrected, she has started new higher script and will have close f/u ? ?Hyperlipidemia LDL goal <100 ?Hyperlipidemia:Low fat diet discussed and encouraged. ? ? ?Lipid Panel  ?Lab Results  ?Component Value Date  ? CHOL 129 09/06/2021  ? HDL 52 09/06/2021  ? [place] 56 09/06/2021  ? TRIG 118 09/06/2021  ? CHOLHDL 2.5 09/06/2021  ? ? ? ?Controlled, no change in medication ? ? ?  At high risk for falls ?Home safety reviewed, no falls in 1 year ? ?Parent refuses immunizations ?Offered , re educated and refuses vaccines ? ?

## 2021-12-04 NOTE — Assessment & Plan Note (Signed)
Unchanged, management a before ?

## 2021-12-04 NOTE — Assessment & Plan Note (Signed)
Uncontrolled, needs to take prescribed med dose, pharmacy also contacted as they deliver her meds ?DASH diet and commitment to daily physical activity for a minimum of 30 minutes discussed and encouraged, as a part of hypertension management. ?The importance of attaining a healthy weight is also discussed. ? ? ?  11/30/2021  ?  2:03 PM 10/06/2021  ?  2:05 PM 05/24/2021  ?  1:33 PM 02/28/2021  ?  1:59 PM 11/29/2020  ?  1:36 PM 08/30/2020  ?  2:11 PM 05/31/2020  ?  1:24 PM  ?BP/Weight  ?Systolic BP 429 037 955 831 110  138  ?Diastolic BP 76 66 74 60 59  78  ?Wt. (Lbs) 155 150.4 156.08 158 160 167.8   ?BMI 28.35 kg/m2 27.51 kg/m2 28.55 kg/m2 28.9 kg/m2 29.26 kg/m2 30.69 kg/m2   ? ? ? ?Review in 8 weeks ?

## 2021-12-04 NOTE — Assessment & Plan Note (Signed)
Offered , re educated and refuses vaccines ?

## 2021-12-04 NOTE — Assessment & Plan Note (Signed)
Home safety reviewed, no falls in 1 year ?

## 2021-12-04 NOTE — Assessment & Plan Note (Signed)
Hyperlipidemia:Low fat diet discussed and encouraged. ? ? ?Lipid Panel  ?Lab Results  ?Component Value Date  ? CHOL 129 09/06/2021  ? HDL 52 09/06/2021  ? [place] 56 09/06/2021  ? TRIG 118 09/06/2021  ? CHOLHDL 2.5 09/06/2021  ? ? ? ?Controlled, no change in medication ? ?

## 2021-12-04 NOTE — Assessment & Plan Note (Signed)
Under corrected, she has started new higher script and will have close f/u ?

## 2021-12-08 ENCOUNTER — Other Ambulatory Visit: Payer: Self-pay | Admitting: Family Medicine

## 2021-12-08 DIAGNOSIS — N184 Chronic kidney disease, stage 4 (severe): Secondary | ICD-10-CM

## 2022-01-04 ENCOUNTER — Other Ambulatory Visit: Payer: Self-pay | Admitting: Family Medicine

## 2022-01-04 DIAGNOSIS — N184 Chronic kidney disease, stage 4 (severe): Secondary | ICD-10-CM

## 2022-01-19 DIAGNOSIS — D631 Anemia in chronic kidney disease: Secondary | ICD-10-CM | POA: Diagnosis not present

## 2022-01-19 DIAGNOSIS — I10 Essential (primary) hypertension: Secondary | ICD-10-CM | POA: Diagnosis not present

## 2022-01-19 DIAGNOSIS — N189 Chronic kidney disease, unspecified: Secondary | ICD-10-CM | POA: Diagnosis not present

## 2022-01-20 LAB — CBC
Hematocrit: 31.2 % — ABNORMAL LOW (ref 34.0–46.6)
Hemoglobin: 10.3 g/dL — ABNORMAL LOW (ref 11.1–15.9)
MCH: 31.4 pg (ref 26.6–33.0)
MCHC: 33 g/dL (ref 31.5–35.7)
MCV: 95 fL (ref 79–97)
Platelets: 151 10*3/uL (ref 150–450)
RBC: 3.28 x10E6/uL — ABNORMAL LOW (ref 3.77–5.28)
RDW: 11.7 % (ref 11.7–15.4)
WBC: 7.2 10*3/uL (ref 3.4–10.8)

## 2022-01-20 LAB — BMP8+EGFR
BUN/Creatinine Ratio: 14 (ref 12–28)
BUN: 32 mg/dL — ABNORMAL HIGH (ref 8–27)
CO2: 20 mmol/L (ref 20–29)
Calcium: 9.3 mg/dL (ref 8.7–10.3)
Chloride: 105 mmol/L (ref 96–106)
Creatinine, Ser: 2.28 mg/dL — ABNORMAL HIGH (ref 0.57–1.00)
Glucose: 71 mg/dL (ref 70–99)
Potassium: 5 mmol/L (ref 3.5–5.2)
Sodium: 141 mmol/L (ref 134–144)
eGFR: 21 mL/min/{1.73_m2} — ABNORMAL LOW (ref >59)

## 2022-01-20 LAB — TSH: TSH: 0.037 u[IU]/mL — ABNORMAL LOW (ref 0.450–4.500)

## 2022-01-23 ENCOUNTER — Other Ambulatory Visit: Payer: Self-pay

## 2022-01-23 DIAGNOSIS — D631 Anemia in chronic kidney disease: Secondary | ICD-10-CM

## 2022-01-26 ENCOUNTER — Encounter: Payer: Self-pay | Admitting: Family Medicine

## 2022-01-26 ENCOUNTER — Ambulatory Visit (INDEPENDENT_AMBULATORY_CARE_PROVIDER_SITE_OTHER): Payer: Medicare HMO | Admitting: Family Medicine

## 2022-01-26 DIAGNOSIS — E038 Other specified hypothyroidism: Secondary | ICD-10-CM | POA: Diagnosis not present

## 2022-01-26 DIAGNOSIS — E785 Hyperlipidemia, unspecified: Secondary | ICD-10-CM | POA: Diagnosis not present

## 2022-01-26 DIAGNOSIS — Z2882 Immunization not carried out because of caregiver refusal: Secondary | ICD-10-CM | POA: Diagnosis not present

## 2022-01-26 DIAGNOSIS — G8929 Other chronic pain: Secondary | ICD-10-CM

## 2022-01-26 DIAGNOSIS — I10 Essential (primary) hypertension: Secondary | ICD-10-CM

## 2022-01-26 LAB — SPECIMEN STATUS REPORT

## 2022-01-26 LAB — FERRITIN: Ferritin: 212 ng/mL — ABNORMAL HIGH (ref 15–150)

## 2022-01-26 LAB — IRON: Iron: 86 ug/dL (ref 27–139)

## 2022-01-26 MED ORDER — TIZANIDINE HCL 4 MG PO TABS: ORAL_TABLET | ORAL | 5 refills | Status: DC

## 2022-01-26 MED ORDER — AMLODIPINE BESYLATE 5 MG PO TABS: 5.0000 mg | ORAL_TABLET | Freq: Every day | ORAL | 5 refills | Status: DC

## 2022-01-26 MED ORDER — TRAZODONE HCL 100 MG PO TABS: 100.0000 mg | ORAL_TABLET | Freq: Every day | ORAL | 5 refills | Status: DC

## 2022-01-26 MED ORDER — METHADONE HCL 10 MG PO TABS: ORAL_TABLET | ORAL | 0 refills | Status: DC

## 2022-01-26 NOTE — Patient Instructions (Addendum)
F/u in 12 weeks, call if you need me sooner  New lower dose of synthroid 300 mcg is Half tablet on Monday, Wednesday and Friday, continue One tablet on Tuesday, Thursday, Saturday and Sunday  Please get fasting lipid, cmp and egfr, TSH, and CBC, 5 to 7 days before next visit  Careful not to fall  HAPPY BIRTHDAY, and hope you have many more!  Thanks for choosing [facility], we consider it a privelige to serve you.

## 2022-01-29 ENCOUNTER — Encounter: Payer: Self-pay | Admitting: Family Medicine

## 2022-01-29 MED ORDER — METHADONE HCL 10 MG PO TABS: 10.0000 mg | ORAL_TABLET | Freq: Three times a day (TID) | ORAL | 0 refills | Status: AC

## 2022-01-31 ENCOUNTER — Other Ambulatory Visit: Payer: Self-pay | Admitting: Family Medicine

## 2022-02-02 ENCOUNTER — Other Ambulatory Visit: Payer: Self-pay | Admitting: Family Medicine

## 2022-02-09 ENCOUNTER — Other Ambulatory Visit: Payer: Self-pay | Admitting: Family Medicine

## 2022-02-09 DIAGNOSIS — N184 Chronic kidney disease, stage 4 (severe): Secondary | ICD-10-CM

## 2022-02-16 ENCOUNTER — Other Ambulatory Visit: Payer: Self-pay | Admitting: Family Medicine

## 2022-02-28 ENCOUNTER — Ambulatory Visit (INDEPENDENT_AMBULATORY_CARE_PROVIDER_SITE_OTHER): Payer: Medicare HMO

## 2022-02-28 DIAGNOSIS — Z0000 Encounter for general adult medical examination without abnormal findings: Secondary | ICD-10-CM

## 2022-02-28 NOTE — Progress Notes (Signed)
Subjective:   Katelyn Rowland is a 81 y.o. female who presents for Medicare Annual (Subsequent) preventive examination.  I connected with  Rozanna Boer on 02/28/22 by a audio enabled telemedicine application and verified that I am speaking with the correct person using two identifiers.  Patient Location: Home  Provider Location: Office/Clinic  I discussed the limitations of evaluation and management by telemedicine. The patient expressed understanding and agreed to proceed.   Review of Systems     Katelyn Rowland , Thank you for taking time to come for your Medicare Wellness Visit. I appreciate your ongoing commitment to your health goals. Please review the following plan we discussed and let me know if I can assist you in the future.   These are the goals we discussed:  Goals      DIET - EAT MORE FRUITS AND VEGETABLES     Exercise 3x per week (30 min per time)     Try to walk around for 15-30 mins a couple times a week as tolerated      Prevent falls        This is a list of the screening recommended for you and due dates:  Health Maintenance  Topic Date Due   COVID-19 Vaccine (1) Never done   Zoster (Shingles) Vaccine (1 of 2) Never done   Tetanus Vaccine  08/24/2022*   Pneumonia Vaccine (2 - PCV) 08/27/2022*   Flu Shot  03/07/2022   DEXA scan (bone density measurement)  Completed   HPV Vaccine  Aged Out  *Topic was postponed. The date shown is not the original due date.          Objective:    There were no vitals filed for this visit. There is no height or weight on file to calculate BMI.     01/26/2021    1:09 PM 03/02/2019    1:47 PM 01/07/2018    1:44 PM 03/29/2016    2:11 PM 02/04/2015    8:16 AM 11/18/2014    2:45 PM 10/16/2014   11:31 AM  Advanced Directives  Does Patient Have a Medical Advance Directive? No;Yes No Yes No No Yes No  Type of Advance Directive Living will;Healthcare Power of Attorney  Out of facility DNR (pink MOST or yellow form);Living  will;Healthcare Power of [place]   Does patient want to make changes to medical advance directive?     No - Patient declined    Copy of [place] in Chart? Yes - validated most recent copy scanned in chart (See row information)  Yes  No - copy requested No - copy requested   Would patient like information on creating a medical advance directive? No - Patient declined No - Patient declined  No - patient declined information   No - patient declined information  Pre-existing out of facility DNR order (yellow form or pink MOST form)   Yellow form placed in chart (order not valid for inpatient use)        Current Medications (verified) Outpatient Encounter Medications as of 02/28/2022  Medication Sig   amLODipine (NORVASC) 2.5 MG tablet Take 1 tablet (2.5 mg total) by mouth daily.   amLODipine (NORVASC) 5 MG tablet Take 1 tablet (5 mg total) by mouth daily.   amLODipine (NORVASC) 5 MG tablet Take 1 tablet (5 mg total) by mouth daily.   calcitRIOL (ROCALTROL) 0.25 MCG capsule TAKE (1) CAPSULE BY  MOUTH THREE TIMES WEEKLY.   diphenhydrAMINE (BENADRYL) 25 mg capsule Take 25 mg by mouth every 8 (eight) hours as needed for allergies.   esomeprazole (NEXIUM) 40 MG capsule TAKE ONE CAPSULE BY MOUTH DAILY BEFORE BREAKFAST.   levothyroxine (SYNTHROID) 300 MCG tablet Take 1 tablet (300 mcg total) by mouth daily.   levothyroxine (SYNTHROID) 300 MCG tablet Take half tablet by mouth every Monday, Wednesday and Friday. Then take one tablet by mouth every Tuesday, Thursday, Saturday and Sunday   lisinopril (ZESTRIL) 2.5 MG tablet TAKE (1) TABLET BY MOUTH ONCE DAILY.   methadone (DOLOPHINE) 10 MG tablet Take 1 tablet (10 mg total) by mouth every 8 (eight) hours.   methadone (DOLOPHINE) 10 MG tablet Take one tablet by mouth every 8 hours for chronic pain   methadone (DOLOPHINE) 10 MG tablet TAKE ONE TABLET BY MOUTH THREE TIMES DAILY FOR PAIN    methadone (DOLOPHINE) 10 MG tablet Take one tablet by mouth three times daily   methadone (DOLOPHINE) 10 MG tablet Take one tablet by mouth three times daily for chronic pain   [START ON 03/03/2022] methadone (DOLOPHINE) 10 MG tablet Take 1 tablet (10 mg total) by mouth every 8 (eight) hours.   [START ON 03/31/2022] methadone (DOLOPHINE) 10 MG tablet Take 1 tablet (10 mg total) by mouth every 8 (eight) hours.   naloxone (NARCAN) nasal spray 4 mg/0.1 mL One spray into each nostril once, as needed   polyethylene glycol powder (GLYCOLAX/MIRALAX) powder MIX 1 CAPFUL (17 GRAMS) WITH 8OZ OF WATER OR JUICE DAILY.   rosuvastatin (CRESTOR) 20 MG tablet Take one tablet by mouth every Tuesday and Thursday   tiZANidine (ZANAFLEX) 4 MG tablet Take one tablet by mouth two times daily for spasm   traZODone (DESYREL) 100 MG tablet Take 1 tablet (100 mg total) by mouth at bedtime.   No facility-administered encounter medications on file as of 02/28/2022.    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, recurrent most recent hopitalisation    Edema of both legs    GERD (gastroesophageal reflux disease)    Headache(784.0)    Hematoma of leg    left    High blood pressure    Hypertension    margaret simpson  [id number]   Muscle cramps    Neck pain    PONV (postoperative nausea and vomiting)    Reflux    Thyroid disorder    Past Surgical History:  Procedure Laterality Date   ABDOMINAL HYSTERECTOMY     APPENDECTOMY     BIOPSY N/A 11/20/2014   Procedure: BIOPSY;  Surgeon: Rogene Houston, MD;  Location: [facility];  Service: Endoscopy;  Laterality: N/A;   CERVICAL SPINE SURGERY     ESOPHAGEAL  DILATION N/A 11/20/2014   Procedure: ESOPHAGEAL DILATION [place];  Surgeon: Rogene Houston, MD;  Location: [facility];  Service: Endoscopy;  Laterality: N/A;   ESOPHAGOGASTRODUODENOSCOPY (EGD) WITH PROPOFOL N/A 11/20/2014   Procedure: ESOPHAGOGASTRODUODENOSCOPY (EGD) WITH PROPOFOL;  Surgeon: Rogene Houston, MD;  Location: [facility];  Service: Endoscopy;  Laterality: N/A;   FOOT NEUROMA SURGERY     right  ivc filter placement, following recurrent left DVT  07/2009   NASAL SINUS SURGERY     THORACIC DISCECTOMY  06/12/2012   Procedure: THORACIC DISCECTOMY;  Surgeon: Floyce Stakes, MD;  Location: [facility];  Service: Neurosurgery;  Laterality: N/A;  Thoracic [place]Eleven Thoracic Laminectomy   TONSILLECTOMY     TONSILLECTOMY AND ADENOIDECTOMY     unilateal oopherectomy     VESICOVAGINAL FISTULA CLOSURE W/ TAH     Family History  Problem Relation Age of Onset   Emphysema Sister    Kidney disease Sister    Diabetes Mother    Diabetes Father    Diabetes Brother    Arthritis Other    Kidney disease Other    Social History   Socioeconomic History   Marital status: Widowed    Spouse name: Not on file   Number of children: 4   Years of education: 14   Highest education level: Not on file  Occupational History   Occupation: disabled     Employer: RETIRED  Tobacco Use   Smoking status: Former    Packs/day: 1.00    Years: 42.00    Total pack years: 42.00    Types: Cigarettes    Quit date: 06/04/2010    Years since quitting: 11.7   Smokeless tobacco: Never  Vaping Use   Vaping Use: Never used  Substance and Sexual Activity   Alcohol use: No   Drug use: No   Sexual activity: Never    Birth control/protection: Surgical  Other Topics Concern   Not on file  Social History Narrative   Not on file   Social Determinants of Health   Financial Resource Strain: Low Risk  (01/26/2021)   Overall Financial Resource Strain (CARDIA)    Difficulty of Paying Living Expenses: Not  hard at all  Food Insecurity: No Food Insecurity (01/26/2021)   Hunger Vital Sign    Worried About Running Out of Food in the Last Year: Never true    [place] in the Last Year: Never true  Transportation Needs: No Transportation Needs (01/26/2021)   PRAPARE - Transportation    Lack of Transportation (Medical): No    Lack of Transportation (Non-Medical): No  Physical Activity: Inactive (01/26/2020)   Exercise Vital Sign    Days of Exercise per Week: 0 days    Minutes of Exercise per Session: 0 min  Stress: No Stress Concern Present (01/26/2021)   [place]    Feeling of Stress : Not at all  Social Connections: Socially Isolated (01/26/2021)   Social Connection and Isolation Panel [NHANES]    Frequency of Communication with Friends and Family: More than three times a week    Frequency of Social Gatherings with Friends and Family: Twice a week    Attends Religious Services: Never    Marine scientist or Organizations: No    Attends Archivist Meetings: Never    Marital Status: Widowed    Tobacco Counseling Counseling given: Not Answered   Clinical Intake:                 Diabetic? No         Activities of Daily Living     No data to display           Patient Care Team: Fayrene Helper, MD as PCP - General  Indicate any recent Medical Services you may have received from other than  [facility] providers in the past year (date may be approximate).     Assessment:   This is a routine wellness examination for Katelyn Rowland.  Hearing/Vision screen No results found.  Dietary issues and exercise activities discussed:     Goals Addressed   None   Depression Screen    01/26/2022   11:15 AM 08/23/2021    1:30 PM 05/24/2021    1:32 PM 02/28/2021    2:01 PM 01/26/2021    1:10 PM 01/26/2021    1:07 PM 11/29/2020    1:38 PM  PHQ 2/9 Scores  PHQ - 2 Score 0 0 0 1 0 0 0  PHQ- 9  Score   0        Fall Risk    01/26/2022   11:15 AM 11/30/2021    2:04 PM 10/06/2021    2:08 PM 08/23/2021    1:30 PM 05/24/2021    1:32 PM  Fall Risk   Falls in the past year? 0 0 0 0 0  Number falls in past yr: 0 0 0 0   Injury with Fall? 0 0 0 0   Risk for fall due to : No Fall Risks   No Fall Risks   Follow up Falls evaluation completed   Falls evaluation completed     FALL RISK PREVENTION PERTAINING TO THE HOME:  Any stairs in or around the home? No  If so, are there any without handrails?  N/A Home free of loose throw rugs in walkways, pet beds, electrical cords, etc? Yes  Adequate lighting in your home to reduce risk of falls? Yes   ASSISTIVE DEVICES UTILIZED TO PREVENT FALLS:  Life alert? No  Use of a cane, walker or w/c? Yes  Grab bars in the bathroom? Yes  Shower chair or bench in shower? Yes  Elevated toilet seat or a handicapped toilet? No    Cognitive Function:    12/10/2015    2:15 PM  MMSE - Mini Mental State Exam  Orientation to time 5  Orientation to Place 5  Registration 3  Attention/ Calculation 5  Recall 3  Language- name 2 objects 2  Language- repeat 1  Language- follow 3 step command 3  Language- read & follow direction 1  Write a sentence 1  Copy design 0  Total score 29        01/26/2021    1:16 PM 01/26/2020    1:24 PM 01/15/2019   11:09 AM 01/07/2018    1:47 PM  6CIT Screen  What Year? 0 points 0 points 0 points 0 points  What month? 0 points 0 points 0 points 0 points  What time? 0 points 0 points 0 points 0 points  Count back from 20 0 points 0 points 0 points 0 points  Months in reverse 0 points 0 points 0 points 0 points  Repeat phrase 0 points 2 points 0 points 0 points  Total Score 0 points 2 points 0 points 0 points    Immunizations Immunization History  Administered Date(s) Administered   Pneumococcal Polysaccharide-23 03/26/2006   Td 12/30/2003    TDAP status: Up to date  Flu Vaccine status: Up to  date  Pneumococcal vaccine status: Up to date  Covid-19 vaccine status: Declined, Education has been provided regarding the importance of this vaccine but patient still declined. Advised may receive this vaccine at local pharmacy or Health Dept.or vaccine clinic. Aware to provide a copy of the vaccination record if obtained from  local pharmacy or Health Dept. Verbalized acceptance and understanding.  Qualifies for Shingles Vaccine? Yes   Zostavax completed No   Shingrix Completed?: No.    Education has been provided regarding the importance of this vaccine. Patient has been advised to call insurance company to determine out of pocket expense if they have not yet received this vaccine. Advised may also receive vaccine at local pharmacy or Health Dept. Verbalized acceptance and understanding.  Screening Tests Health Maintenance  Topic Date Due   COVID-19 Vaccine (1) Never done   Zoster Vaccines- Shingrix (1 of 2) Never done   TETANUS/TDAP  08/24/2022 (Originally 12/29/2013)   Pneumonia Vaccine 49+ Years old (2 - PCV) 08/27/2022 (Originally 03/27/2007)   INFLUENZA VACCINE  03/07/2022   DEXA SCAN  Completed   HPV VACCINES  Aged Out    Health Maintenance  Health Maintenance Due  Topic Date Due   COVID-19 Vaccine (1) Never done   Zoster Vaccines- Shingrix (1 of 2) Never done    Colorectal cancer screening: No longer required.   Mammogram status: No longer required due to age.  Bone Density status: Completed 06/25/2020. Results reflect: Bone density results: OSTEOPOROSIS. Repeat every 2 years.  Lung Cancer Screening: (Low Dose CT Chest recommended if Age 40-80 years, 30 pack-year currently smoking OR have quit w/in 15years.) does qualify.     Additional Screening:  Hepatitis C Screening: does qualify; Completed   Vision Screening: Recommended annual ophthalmology exams for early detection of glaucoma and other disorders of the eye. Is the patient up to date with their annual eye  exam?  No  Who is the provider or what is the name of the office in which the patient attends annual eye exams?  If pt is not established with a provider, would they like to be referred to a provider to establish care? No .   Dental Screening: Recommended annual dental exams for proper oral hygiene  Community Resource Referral / Chronic Care Management: CRR required this visit?  No   CCM required this visit?  No      Plan:     I have personally reviewed and noted the following in the patient's chart:   Medical and social history Use of alcohol, tobacco or illicit drugs  Current medications and supplements including opioid prescriptions.  Functional ability and status Nutritional status Physical activity Advanced directives List of other physicians Hospitalizations, surgeries, and ER visits in previous 12 months Vitals Screenings to include cognitive, depression, and falls Referrals and appointments  In addition, I have reviewed and discussed with patient certain preventive protocols, quality metrics, and best practice recommendations. A written personalized care plan for preventive services as well as general preventive health recommendations were provided to patient.     Johny Drilling, [place]   02/28/2022   Nurse Notes:  Katelyn Rowland , Thank you for taking time to come for your Medicare Wellness Visit. I appreciate your ongoing commitment to your health goals. Please review the following plan we discussed and let me know if I can assist you in the future.   These are the goals we discussed:  Goals      DIET - EAT MORE FRUITS AND VEGETABLES     Exercise 3x per week (30 min per time)     Try to walk around for 15-30 mins a couple times a week as tolerated      Prevent falls        This is a list of the screening  recommended for you and due dates:  Health Maintenance  Topic Date Due   COVID-19 Vaccine (1) Never done   Zoster (Shingles) Vaccine (1 of 2) Never done    Tetanus Vaccine  08/24/2022*   Pneumonia Vaccine (2 - PCV) 08/27/2022*   Flu Shot  03/07/2022   DEXA scan (bone density measurement)  Completed   HPV Vaccine  Aged Out  *Topic was postponed. The date shown is not the original due date.

## 2022-02-28 NOTE — Patient Instructions (Signed)
Katelyn Rowland , Thank you for taking time to come for your Medicare Wellness Visit. I appreciate your ongoing commitment to your health goals. Please review the following plan we discussed and let me know if I can assist you in the future.   Screening recommendations/referrals: Bone Density: completed Recommended yearly ophthalmology/optometry visit for glaucoma screening and checkup Recommended yearly dental visit for hygiene and checkup  Vaccinations: Influenza vaccine: completed, next one due 03/07/2022 Pneumococcal vaccine: completed, next one due 08/27/2022 Tdap vaccine: completed, next due 08/24/2022   Advanced directives: already has  Conditions/risks identified: falls, hypertension  Next appointment: 1 year   Preventive Care 82 Years and Older, Female Preventive care refers to lifestyle choices and visits with your health care provider that can promote health and wellness. What does preventive care include? A yearly physical exam. This is also called an annual well check. Dental exams once or twice a year. Routine eye exams. Ask your health care provider how often you should have your eyes checked. Personal lifestyle choices, including: Daily care of your teeth and gums. Regular physical activity. Eating a healthy diet. Avoiding tobacco and drug use. Limiting alcohol use. Practicing safe sex. Taking low-dose aspirin every day. Taking vitamin and mineral supplements as recommended by your health care provider. What happens during an annual well check? The services and screenings done by your health care provider during your annual well check will depend on your age, overall health, lifestyle risk factors, and family history of disease. Counseling  Your health care provider may ask you questions about your: Alcohol use. Tobacco use. Drug use. Emotional well-being. Home and relationship well-being. Sexual activity. Eating habits. History of falls. Memory and ability to  understand (cognition). Work and work Statistician. Reproductive health. Screening  You may have the following tests or measurements: Height, weight, and BMI. Blood pressure. Lipid and cholesterol levels. These may be checked every 5 years, or more frequently if you are over 39 years old. Skin check. Lung cancer screening. You may have this screening every year starting at age 82 if you have a 30-pack-year history of smoking and currently smoke or have quit within the past 15 years. Fecal occult blood test (FOBT) of the stool. You may have this test every year starting at age 82. Flexible sigmoidoscopy or colonoscopy. You may have a sigmoidoscopy every 5 years or a colonoscopy every 10 years starting at age 82. Hepatitis C blood test. Hepatitis B blood test. Sexually transmitted disease (STD) testing. Diabetes screening. This is done by checking your blood sugar (glucose) after you have not eaten for a while (fasting). You may have this done every 1-3 years. Bone density scan. This is done to screen for osteoporosis. You may have this done starting at age 82. Mammogram. This may be done every 1-2 years. Talk to your health care provider about how often you should have regular mammograms. Talk with your health care provider about your test results, treatment options, and if necessary, the need for more tests. Vaccines  Your health care provider may recommend certain vaccines, such as: Influenza vaccine. This is recommended every year. Tetanus, diphtheria, and acellular pertussis (Tdap, Td) vaccine. You may need a Td booster every 10 years. Zoster vaccine. You may need this after age 2. Pneumococcal 13-valent conjugate (PCV13) vaccine. One dose is recommended after age 82. Pneumococcal polysaccharide (PPSV23) vaccine. One dose is recommended after age 82. Talk to your health care provider about which screenings and vaccines you need and how often you  need them. This information is not  intended to replace advice given to you by your health care provider. Make sure you discuss any questions you have with your health care provider. Document Released: 08/20/2015 Document Revised: 04/12/2016 Document Reviewed: 05/25/2015 Elsevier Interactive Patient Education  2017 [place] Prevention in the Home Falls can cause injuries. They can happen to people of all ages. There are many things you can do to make your home safe and to help prevent falls. What can I do on the outside of my home? Regularly fix the edges of walkways and driveways and fix any cracks. Remove anything that might make you trip as you walk through a door, such as a raised step or threshold. Trim any bushes or trees on the path to your home. Use bright outdoor lighting. Clear any walking paths of anything that might make someone trip, such as rocks or tools. Regularly check to see if handrails are loose or broken. Make sure that both sides of any steps have handrails. Any raised decks and porches should have guardrails on the edges. Have any leaves, snow, or ice cleared regularly. Use sand or salt on walking paths during winter. Clean up any spills in your garage right away. This includes oil or grease spills. What can I do in the bathroom? Use night lights. Install grab bars by the toilet and in the tub and shower. Do not use towel bars as grab bars. Use non-skid mats or decals in the tub or shower. If you need to sit down in the shower, use a plastic, non-slip stool. Keep the floor dry. Clean up any water that spills on the floor as soon as it happens. Remove soap buildup in the tub or shower regularly. Attach bath mats securely with double-sided non-slip rug tape. Do not have throw rugs and other things on the floor that can make you trip. What can I do in the bedroom? Use night lights. Make sure that you have a light by your bed that is easy to reach. Do not use any sheets or blankets that are  too big for your bed. They should not hang down onto the floor. Have a firm chair that has side arms. You can use this for support while you get dressed. Do not have throw rugs and other things on the floor that can make you trip. What can I do in the kitchen? Clean up any spills right away. Avoid walking on wet floors. Keep items that you use a lot in easy-to-reach places. If you need to reach something above you, use a strong step stool that has a grab bar. Keep electrical cords out of the way. Do not use floor polish or wax that makes floors slippery. If you must use wax, use non-skid floor wax. Do not have throw rugs and other things on the floor that can make you trip. What can I do with my stairs? Do not leave any items on the stairs. Make sure that there are handrails on both sides of the stairs and use them. Fix handrails that are broken or loose. Make sure that handrails are as long as the stairways. Check any carpeting to make sure that it is firmly attached to the stairs. Fix any carpet that is loose or worn. Avoid having throw rugs at the top or bottom of the stairs. If you do have throw rugs, attach them to the floor with carpet tape. Make sure that you have a light switch  at the top of the stairs and the bottom of the stairs. If you do not have them, ask someone to add them for you. What else can I do to help prevent falls? Wear shoes that: Do not have high heels. Have rubber bottoms. Are comfortable and fit you well. Are closed at the toe. Do not wear sandals. If you use a stepladder: Make sure that it is fully opened. Do not climb a closed stepladder. Make sure that both sides of the stepladder are locked into place. Ask someone to hold it for you, if possible. Clearly mark and make sure that you can see: Any grab bars or handrails. First and last steps. Where the edge of each step is. Use tools that help you move around (mobility aids) if they are needed. These  include: Canes. Walkers. Scooters. Crutches. Turn on the lights when you go into a dark area. Replace any light bulbs as soon as they burn out. Set up your furniture so you have a clear path. Avoid moving your furniture around. If any of your floors are uneven, fix them. If there are any pets around you, be aware of where they are. Review your medicines with your doctor. Some medicines can make you feel dizzy. This can increase your chance of falling. Ask your doctor what other things that you can do to help prevent falls. This information is not intended to replace advice given to you by your health care provider. Make sure you discuss any questions you have with your health care provider. Document Released: 05/20/2009 Document Revised: 12/30/2015 Document Reviewed: 08/28/2014 Elsevier Interactive Patient Education  2017 [employer].

## 2022-03-02 ENCOUNTER — Other Ambulatory Visit: Payer: Self-pay | Admitting: Family Medicine

## 2022-03-08 ENCOUNTER — Other Ambulatory Visit: Payer: Self-pay | Admitting: Family Medicine

## 2022-03-08 DIAGNOSIS — N184 Chronic kidney disease, stage 4 (severe): Secondary | ICD-10-CM

## 2022-04-05 ENCOUNTER — Other Ambulatory Visit: Payer: Self-pay | Admitting: Family Medicine

## 2022-04-05 DIAGNOSIS — N184 Chronic kidney disease, stage 4 (severe): Secondary | ICD-10-CM

## 2022-04-11 ENCOUNTER — Other Ambulatory Visit: Payer: Self-pay | Admitting: Family Medicine

## 2022-04-11 DIAGNOSIS — I10 Essential (primary) hypertension: Secondary | ICD-10-CM

## 2022-04-11 DIAGNOSIS — E785 Hyperlipidemia, unspecified: Secondary | ICD-10-CM

## 2022-04-11 DIAGNOSIS — E038 Other specified hypothyroidism: Secondary | ICD-10-CM

## 2022-04-11 DIAGNOSIS — R7303 Prediabetes: Secondary | ICD-10-CM

## 2022-04-12 LAB — CBC
Hematocrit: 33.7 % — ABNORMAL LOW (ref 34.0–46.6)
Hemoglobin: 11.1 g/dL (ref 11.1–15.9)
MCH: 31.4 pg (ref 26.6–33.0)
MCHC: 32.9 g/dL (ref 31.5–35.7)
MCV: 96 fL (ref 79–97)
Platelets: 174 10*3/uL (ref 150–450)
RBC: 3.53 x10E6/uL — ABNORMAL LOW (ref 3.77–5.28)
RDW: 12.2 % (ref 11.7–15.4)
WBC: 6 10*3/uL (ref 3.4–10.8)

## 2022-04-12 LAB — LIPID PANEL
Chol/HDL Ratio: 2.7 ratio (ref 0.0–4.4)
Cholesterol, Total: 172 mg/dL (ref 100–199)
HDL: 64 mg/dL (ref >39)
LDL Chol Calc (NIH): 90 mg/dL (ref 0–99)
Triglycerides: 97 mg/dL (ref 0–149)
VLDL Cholesterol Cal: 18 mg/dL (ref 5–40)

## 2022-04-12 LAB — CMP14+EGFR
ALT: 7 IU/L (ref 0–32)
AST: 14 IU/L (ref 0–40)
Albumin/Globulin Ratio: 1.8 (ref 1.2–2.2)
Albumin: 4.1 g/dL (ref 3.7–4.7)
Alkaline Phosphatase: 61 IU/L (ref 44–121)
BUN/Creatinine Ratio: 15 (ref 12–28)
BUN: 39 mg/dL — ABNORMAL HIGH (ref 8–27)
Bilirubin Total: 0.3 mg/dL (ref 0.0–1.2)
CO2: 23 mmol/L (ref 20–29)
Calcium: 9.7 mg/dL (ref 8.7–10.3)
Chloride: 103 mmol/L (ref 96–106)
Creatinine, Ser: 2.61 mg/dL — ABNORMAL HIGH (ref 0.57–1.00)
Globulin, Total: 2.3 g/dL (ref 1.5–4.5)
Glucose: 80 mg/dL (ref 70–99)
Potassium: 4.7 mmol/L (ref 3.5–5.2)
Sodium: 138 mmol/L (ref 134–144)
Total Protein: 6.4 g/dL (ref 6.0–8.5)
eGFR: 18 mL/min/{1.73_m2} — ABNORMAL LOW (ref >59)

## 2022-04-12 LAB — TSH: TSH: 0.021 u[IU]/mL — ABNORMAL LOW (ref 0.450–4.500)

## 2022-04-19 ENCOUNTER — Other Ambulatory Visit: Payer: Self-pay | Admitting: Family Medicine

## 2022-04-20 ENCOUNTER — Encounter: Payer: Self-pay | Admitting: Family Medicine

## 2022-04-20 ENCOUNTER — Ambulatory Visit (INDEPENDENT_AMBULATORY_CARE_PROVIDER_SITE_OTHER): Payer: Medicare HMO | Admitting: Family Medicine

## 2022-04-20 VITALS — BP 131/71 | HR 75 | Ht 61.0 in | Wt 138.0 lb

## 2022-04-20 DIAGNOSIS — K222 Esophageal obstruction: Secondary | ICD-10-CM | POA: Diagnosis not present

## 2022-04-20 DIAGNOSIS — E038 Other specified hypothyroidism: Secondary | ICD-10-CM | POA: Diagnosis not present

## 2022-04-20 DIAGNOSIS — K219 Gastro-esophageal reflux disease without esophagitis: Secondary | ICD-10-CM | POA: Diagnosis not present

## 2022-04-20 DIAGNOSIS — I10 Essential (primary) hypertension: Secondary | ICD-10-CM

## 2022-04-20 DIAGNOSIS — G8929 Other chronic pain: Secondary | ICD-10-CM | POA: Diagnosis not present

## 2022-04-20 DIAGNOSIS — M2550 Pain in unspecified joint: Secondary | ICD-10-CM

## 2022-04-20 DIAGNOSIS — E785 Hyperlipidemia, unspecified: Secondary | ICD-10-CM | POA: Diagnosis not present

## 2022-04-20 NOTE — Patient Instructions (Addendum)
   F/u week of December 11, call if you need me sooner   Methadone will be filled every 28 days on a Monday starting Sept 25 Re[facility] [place] on oct 30, 31, or Nov 1  Careful not to fall  Thanks for choosing [facility], we consider it a privelige to serve you.

## 2022-04-24 ENCOUNTER — Encounter: Payer: Self-pay | Admitting: Family Medicine

## 2022-04-24 MED ORDER — METHADONE HCL 10 MG PO TABS: ORAL_TABLET | ORAL | 0 refills | Status: AC

## 2022-04-24 MED ORDER — METHADONE HCL 10 MG PO TABS: 10.0000 mg | ORAL_TABLET | Freq: Three times a day (TID) | ORAL | 0 refills | Status: DC

## 2022-04-24 NOTE — Assessment & Plan Note (Signed)
The patient's Controlled Substance registry is reviewed and compliance confirmed. Adequacy of  Pain control and level of function is assessed. Medication dosing is adjusted as deemed appropriate. Twelve weeks of medication is prescribed , with a follow up appointment between 11 to 12 weeks .  

## 2022-04-24 NOTE — Assessment & Plan Note (Signed)
Over corrected, dose reduced and re eval in 5 weeks

## 2022-04-24 NOTE — Assessment & Plan Note (Signed)
Hyperlipidemia:Low fat diet discussed and encouraged.   Lipid Panel  Lab Results  Component Value Date   CHOL 172 04/11/2022   HDL 64 04/11/2022   LDLCALC 90 04/11/2022   TRIG 97 04/11/2022   CHOLHDL 2.7 04/11/2022     Controlled, no change in medication  

## 2022-04-24 NOTE — Progress Notes (Signed)
   Katelyn Rowland     MRN: [id number]      DOB: 06-28-1941   HPI Katelyn Rowland is here for follow up and re-evaluation of chronic medical conditions, medication management and review of any available recent lab and radiology data.  Preventive health is updated, specifically  Cancer screening and Immunization.   Questions or concerns regarding consultations or procedures which the PT has had in the interim are  addressed. The PT denies any adverse reactions to current medications since the last visit.  There are no new concerns.  There are no specific complaints   ROS Denies recent fever or chills. Denies sinus pressure, nasal congestion, ear pain or sore throat. Denies chest congestion, productive cough or wheezing. Denies chest pains, palpitations and leg swelling Denies abdominal pain, nausea, vomiting,diarrhea or constipation.  C/o weight loss Denie UNCONTROLLED  joint pain, swelling and limitation in mobility. Denies headaches, seizures, numbness, or tingling. Denies depression, anxiety or insomnia. Denies skin break down or rash.   PE  BP 131/71 (BP Location: Right Arm, Patient Position: Sitting, Cuff Size: Normal)   Pulse 75   Ht '5\' 1"'$  (1.549 m)   Wt 138 lb (62.6 kg)   SpO2 96%   BMI 26.07 kg/m   Patient alert and oriented and in no cardiopulmonary distress.  HEENT: No facial asymmetry, EOMI,     Neck decreased ROM Chest: Clear to auscultation bilaterally.  CVS: S1, S2 no murmurs, no S3.Regular rate.  ABD: Soft non tender.   Ext: No edema  MS: decreased  ROM spine, shoulders, hips and knees.  Skin: Intact, no ulcerations or rash noted.  Psych: Good eye contact, normal affect. Memory intact not anxious or depressed appearing.  CNS: CN 2-12 intact, power,  normal throughout.no focal deficits noted.   Assessment & Plan  Generalized joint pain Chronic , unchanged, adequately managed with current regime  Hypothyroidism Over corrected, dose reduced and re eval  in 5 weeks  GERD with stricture Controlled, no change in medication   Essential hypertension Controlled, no change in medication DASH diet and commitment to daily physical activity for a minimum of 30 minutes discussed and encouraged, as a part of hypertension management. The importance of attaining a healthy weight is also discussed.     04/20/2022    2:14 PM 11/30/2021    2:03 PM 10/06/2021    2:05 PM 05/24/2021    1:33 PM 02/28/2021    1:59 PM 11/29/2020    1:36 PM 08/30/2020    2:11 PM  BP/Weight  Systolic BP 992 426 834 196 222 979   Diastolic BP 71 76 66 74 60 59   Wt. (Lbs) 138 155 150.4 156.08 158 160 167.8  BMI 26.07 kg/m2 28.35 kg/m2 27.51 kg/m2 28.55 kg/m2 28.9 kg/m2 29.26 kg/m2 30.69 kg/m2       Hyperlipidemia LDL goal <100 Hyperlipidemia:Low fat diet discussed and encouraged.   Lipid Panel  Lab Results  Component Value Date   CHOL 172 04/11/2022   HDL 64 04/11/2022   LDLCALC 90 04/11/2022   TRIG 97 04/11/2022   CHOLHDL 2.7 04/11/2022  Controlled, no change in medication      Encounter for chronic pain management The patient's Controlled Substance registry is reviewed and compliance confirmed. Adequacy of  Pain control and level of function is assessed. Medication dosing is adjusted as deemed appropriate. Twelve weeks of medication is prescribed , with a follow up appointment between 11 to 12 weeks .

## 2022-04-24 NOTE — Assessment & Plan Note (Signed)
Chronic , unchanged, adequately managed with current regime

## 2022-04-24 NOTE — Assessment & Plan Note (Signed)
Controlled, no change in medication  

## 2022-04-24 NOTE — Assessment & Plan Note (Signed)
Controlled, no change in medication DASH diet and commitment to daily physical activity for a minimum of 30 minutes discussed and encouraged, as a part of hypertension management. The importance of attaining a healthy weight is also discussed.     04/20/2022    2:14 PM 11/30/2021    2:03 PM 10/06/2021    2:05 PM 05/24/2021    1:33 PM 02/28/2021    1:59 PM 11/29/2020    1:36 PM 08/30/2020    2:11 PM  BP/Weight  Systolic BP 715 953 967 289 791 504   Diastolic BP 71 76 66 74 60 59   Wt. (Lbs) 138 155 150.4 156.08 158 160 167.8  BMI 26.07 kg/m2 28.35 kg/m2 27.51 kg/m2 28.55 kg/m2 28.9 kg/m2 29.26 kg/m2 30.69 kg/m2

## 2022-04-25 ENCOUNTER — Ambulatory Visit: Payer: Medicare HMO | Admitting: Family Medicine

## 2022-05-03 ENCOUNTER — Other Ambulatory Visit: Payer: Self-pay | Admitting: Family Medicine

## 2022-05-03 DIAGNOSIS — N184 Chronic kidney disease, stage 4 (severe): Secondary | ICD-10-CM

## 2022-05-18 ENCOUNTER — Other Ambulatory Visit: Payer: Self-pay | Admitting: Family Medicine

## 2022-05-30 ENCOUNTER — Other Ambulatory Visit: Payer: Self-pay | Admitting: Family Medicine

## 2022-05-30 DIAGNOSIS — N184 Chronic kidney disease, stage 4 (severe): Secondary | ICD-10-CM

## 2022-06-06 DIAGNOSIS — E038 Other specified hypothyroidism: Secondary | ICD-10-CM | POA: Diagnosis not present

## 2022-06-07 ENCOUNTER — Telehealth: Payer: Self-pay

## 2022-06-07 ENCOUNTER — Other Ambulatory Visit: Payer: Self-pay | Admitting: Family Medicine

## 2022-06-07 LAB — TSH: TSH: 0.082 u[IU]/mL — ABNORMAL LOW (ref 0.450–4.500)

## 2022-06-07 MED ORDER — LEVOTHYROXINE SODIUM 125 MCG PO TABS: 125.0000 ug | ORAL_TABLET | Freq: Every day | ORAL | 1 refills | Status: DC

## 2022-06-07 MED ORDER — LEVOTHYROXINE SODIUM 300 MCG PO TABS: ORAL_TABLET | ORAL | 1 refills | Status: DC

## 2022-06-07 NOTE — Telephone Encounter (Signed)
Patient return call to Katelyn Rowland said it had to do with her medicine. Please return call [phone].

## 2022-06-07 NOTE — Progress Notes (Signed)
Synthroid 125

## 2022-06-07 NOTE — Progress Notes (Signed)
Synthroid 300

## 2022-06-15 ENCOUNTER — Other Ambulatory Visit: Payer: Self-pay | Admitting: Family Medicine

## 2022-07-03 ENCOUNTER — Other Ambulatory Visit: Payer: Self-pay | Admitting: Family Medicine

## 2022-07-04 ENCOUNTER — Other Ambulatory Visit: Payer: Self-pay | Admitting: Family Medicine

## 2022-07-04 DIAGNOSIS — N184 Chronic kidney disease, stage 4 (severe): Secondary | ICD-10-CM

## 2022-07-13 ENCOUNTER — Other Ambulatory Visit: Payer: Self-pay | Admitting: Family Medicine

## 2022-07-20 ENCOUNTER — Ambulatory Visit (INDEPENDENT_AMBULATORY_CARE_PROVIDER_SITE_OTHER): Payer: Medicare HMO | Admitting: Family Medicine

## 2022-07-20 ENCOUNTER — Encounter: Payer: Self-pay | Admitting: Family Medicine

## 2022-07-20 VITALS — BP 138/62 | HR 79 | Ht 61.0 in | Wt 137.0 lb

## 2022-07-20 DIAGNOSIS — E038 Other specified hypothyroidism: Secondary | ICD-10-CM | POA: Diagnosis not present

## 2022-07-20 DIAGNOSIS — N189 Chronic kidney disease, unspecified: Secondary | ICD-10-CM | POA: Diagnosis not present

## 2022-07-20 DIAGNOSIS — D631 Anemia in chronic kidney disease: Secondary | ICD-10-CM | POA: Diagnosis not present

## 2022-07-20 DIAGNOSIS — E785 Hyperlipidemia, unspecified: Secondary | ICD-10-CM

## 2022-07-20 DIAGNOSIS — M5116 Intervertebral disc disorders with radiculopathy, lumbar region: Secondary | ICD-10-CM

## 2022-07-20 DIAGNOSIS — R0989 Other specified symptoms and signs involving the circulatory and respiratory systems: Secondary | ICD-10-CM | POA: Diagnosis not present

## 2022-07-20 DIAGNOSIS — I10 Essential (primary) hypertension: Secondary | ICD-10-CM

## 2022-07-20 DIAGNOSIS — G8929 Other chronic pain: Secondary | ICD-10-CM

## 2022-07-20 DIAGNOSIS — Z2882 Immunization not carried out because of caregiver refusal: Secondary | ICD-10-CM

## 2022-07-20 MED ORDER — METHADONE HCL 10 MG PO TABS: ORAL_TABLET | ORAL | 0 refills | Status: DC

## 2022-07-20 NOTE — Patient Instructions (Addendum)
F/u in 11 weeks, call if you need me sooner  TSH today  Be careful not to fall  You are referred for carotid ultra sound appt will be scheduled for end January   Best for 2024!  Thanks for choosing [facility], we consider it a privelige to serve you.

## 2022-07-23 ENCOUNTER — Encounter: Payer: Self-pay | Admitting: Family Medicine

## 2022-07-23 MED ORDER — METHADONE HCL 10 MG PO TABS: 10.0000 mg | ORAL_TABLET | Freq: Three times a day (TID) | ORAL | 0 refills | Status: DC

## 2022-07-23 NOTE — Assessment & Plan Note (Addendum)
Un Controlled, over corrected , lab today

## 2022-07-23 NOTE — Progress Notes (Signed)
   Katelyn Rowland     MRN: [id number]      DOB: November 14, 1940   HPI Katelyn Rowland is here for follow up and re-evaluation of chronic medical conditions, medication management and review of any available recent lab and radiology data.  Preventive health is updated, specifically  Cancer screening and Immunization.   Questions or concerns regarding consultations or procedures which the PT has had in the interim are  addressed. The PT denies any adverse reactions to current medications since the last visit.  C/o audible heartbeat in past 1 month ROS Denies recent fever or chills. Denies sinus pressure, nasal congestion, ear pain or sore throat. Denies chest congestion, productive cough or wheezing. Denies chest pains, palpitations and leg swelling Denies abdominal pain, nausea, vomiting,diarrhea or constipation.   Denies dysuria, frequency, hesitancy or incontinence. Chronic  joint pain,  and limitation in mobility. Denies headaches, seizures, numbness, or tingling. Denies depression, anxiety or insomnia. Denies skin break down or rash.   PE  BP 138/62 (BP Location: Left Arm, Patient Position: Sitting, Cuff Size: Normal)   Pulse 79   Ht '5\' 1"'$  (1.549 m)   Wt 137 lb (62.1 kg)   SpO2 96%   BMI 25.89 kg/m   Patient alert and oriented and in no cardiopulmonary distress.  HEENT: No facial asymmetry, EOMI,     Neck supple .Carotid bruit  Chest: Clear to auscultation bilaterally.  CVS: S1, S2 no murmurs, no S3.Regular rate.  ABD: Soft non tender.   Ext: No edema  MS: decreased  ROM spine, shoulders, hips and knees.  Skin: Intact, no ulcerations or rash noted.  Psych: Good eye contact, normal affect. Memory intact not anxious or depressed appearing.  CNS: CN 2-12 intact, power,  normal throughout.no focal deficits noted.   Assessment & Plan  Essential hypertension Controlled, no change in medication   Hypothyroidism Un Controlled, over corrected , lab today   Lumbar disc  disease with radiculopathy Chronic pain with reduced mobility unchanged, uses cane for safety, no falls since last visit  Parent refuses immunizations Offered and refused  Hyperlipidemia LDL goal <100 Hyperlipidemia:Low fat diet discussed and encouraged.   Lipid Panel  Lab Results  Component Value Date   CHOL 172 04/11/2022   HDL 64 04/11/2022   LDLCALC 90 04/11/2022   TRIG 97 04/11/2022   CHOLHDL 2.7 04/11/2022     Controlled, no change in medication   Encounter for chronic pain management The patient's Controlled Substance registry is reviewed and compliance confirmed. Adequacy of  Pain control and level of function is assessed. Medication dosing is adjusted as deemed appropriate. Twelve weeks of medication is prescribed , with a follow up appointment between 11 to 12 weeks .

## 2022-07-23 NOTE — Assessment & Plan Note (Signed)
Offered and refused

## 2022-07-23 NOTE — Assessment & Plan Note (Signed)
The patient's Controlled Substance registry is reviewed and compliance confirmed. Adequacy of  Pain control and level of function is assessed. Medication dosing is adjusted as deemed appropriate. Twelve weeks of medication is prescribed , with a follow up appointment between 11 to 12 weeks .  

## 2022-07-23 NOTE — Assessment & Plan Note (Signed)
Hyperlipidemia:Low fat diet discussed and encouraged.   Lipid Panel  Lab Results  Component Value Date   CHOL 172 04/11/2022   HDL 64 04/11/2022   LDLCALC 90 04/11/2022   TRIG 97 04/11/2022   CHOLHDL 2.7 04/11/2022     Controlled, no change in medication

## 2022-07-23 NOTE — Assessment & Plan Note (Signed)
Chronic pain with reduced mobility unchanged, uses cane for safety, no falls since last visit

## 2022-07-23 NOTE — Assessment & Plan Note (Signed)
Controlled, no change in medication  

## 2022-08-02 ENCOUNTER — Other Ambulatory Visit: Payer: Self-pay | Admitting: Family Medicine

## 2022-08-02 DIAGNOSIS — N184 Chronic kidney disease, stage 4 (severe): Secondary | ICD-10-CM

## 2022-08-10 ENCOUNTER — Other Ambulatory Visit: Payer: Self-pay | Admitting: Family Medicine

## 2022-08-11 ENCOUNTER — Other Ambulatory Visit: Payer: Self-pay

## 2022-08-11 ENCOUNTER — Other Ambulatory Visit: Payer: Self-pay | Admitting: Family Medicine

## 2022-08-11 ENCOUNTER — Telehealth: Payer: Self-pay | Admitting: Family Medicine

## 2022-08-11 DIAGNOSIS — G8929 Other chronic pain: Secondary | ICD-10-CM

## 2022-08-11 MED ORDER — TRAZODONE HCL 100 MG PO TABS: 100.0000 mg | ORAL_TABLET | Freq: Every day | ORAL | 0 refills | Status: DC

## 2022-08-11 MED ORDER — TIZANIDINE HCL 4 MG PO TABS: ORAL_TABLET | ORAL | 0 refills | Status: DC

## 2022-08-11 NOTE — Telephone Encounter (Signed)
Refills sent

## 2022-08-11 NOTE — Telephone Encounter (Signed)
PATIENT CALLED TAKING HER LAST PILL TODAY  Prescription Request  08/11/2022  Is this a "Controlled Substance" medicine? No  LOV: 07/20/2022  What is the name of the medication or equipment? tiZANidine (ZANAFLEX) 4 MG tablet [[id number]]   traZODone (DESYREL) 100 MG tablet [[id number]]   Have you contacted your pharmacy to request a refill? Yes   Which pharmacy would you like this sent to?  [facility]   Patient notified that their request is being sent to the clinical staff for review and that they should receive a response within 2 business days.   Please advise at [facility] [phone]

## 2022-08-28 ENCOUNTER — Other Ambulatory Visit: Payer: Self-pay | Admitting: Family Medicine

## 2022-08-31 ENCOUNTER — Other Ambulatory Visit: Payer: Self-pay | Admitting: Family Medicine

## 2022-08-31 DIAGNOSIS — N184 Chronic kidney disease, stage 4 (severe): Secondary | ICD-10-CM

## 2022-09-01 ENCOUNTER — Ambulatory Visit (HOSPITAL_COMMUNITY)
Admission: RE | Admit: 2022-09-01 | Discharge: 2022-09-01 | Disposition: A | Discharge status: Home / Self Care | Payer: Medicare HMO | Source: Ambulatory Visit | Attending: Family Medicine | Admitting: Family Medicine

## 2022-09-01 DIAGNOSIS — R0989 Other specified symptoms and signs involving the circulatory and respiratory systems: Secondary | ICD-10-CM | POA: Diagnosis not present

## 2022-09-01 DIAGNOSIS — I6523 Occlusion and stenosis of bilateral carotid arteries: Secondary | ICD-10-CM | POA: Diagnosis not present

## 2022-09-07 ENCOUNTER — Other Ambulatory Visit: Payer: Self-pay | Admitting: Family Medicine

## 2022-09-07 DIAGNOSIS — G8929 Other chronic pain: Secondary | ICD-10-CM

## 2022-09-28 ENCOUNTER — Other Ambulatory Visit: Payer: Self-pay | Admitting: Family Medicine

## 2022-09-28 DIAGNOSIS — N184 Chronic kidney disease, stage 4 (severe): Secondary | ICD-10-CM

## 2022-10-02 ENCOUNTER — Other Ambulatory Visit: Payer: Self-pay | Admitting: Family Medicine

## 2022-10-05 ENCOUNTER — Other Ambulatory Visit: Payer: Self-pay | Admitting: Family Medicine

## 2022-10-05 DIAGNOSIS — G8929 Other chronic pain: Secondary | ICD-10-CM

## 2022-10-10 ENCOUNTER — Telehealth: Payer: Self-pay | Admitting: Family Medicine

## 2022-10-10 ENCOUNTER — Encounter: Payer: Medicare HMO | Admitting: Family Medicine

## 2022-10-10 NOTE — Telephone Encounter (Signed)
Pt unable to make appt due to Dr. Moshe Cipro not in office today. Pt is sche to come back 11/10/22. She is needing a refill on her medication that is due to be filled on 10/16/22.    Prescription Request  10/10/2022  LOV: 07/20/2022  What is the name of the medication or equipment? methadone (DOLOPHINE) 10 MG tablet   Have you contacted your pharmacy to request a refill? No   Which pharmacy would you like this sent to?  [facility], [place] [address] Phone: [phone] Fax: [phone]  [id number] [id number]: [phone]: [phone]  [id number] [place], [place] [address] [place] [id number]: [phone]: [phone]  Patient notified that their request is being sent to the clinical staff for review and that they should receive a response within 2 business days.   Please advise at [phone]

## 2022-10-12 ENCOUNTER — Telehealth: Payer: Self-pay | Admitting: Family Medicine

## 2022-10-12 NOTE — Telephone Encounter (Signed)
Pt called asking for Brandi to call her that it was important

## 2022-10-13 ENCOUNTER — Other Ambulatory Visit: Payer: Self-pay | Admitting: Family Medicine

## 2022-10-13 NOTE — Telephone Encounter (Signed)
Pt called stating she is going to be out of medication by Monday. States we had called to r/s her appt. Wants to know if this can please be filled?

## 2022-10-15 MED ORDER — METHADONE HCL 10 MG PO TABS: 10.0000 mg | ORAL_TABLET | Freq: Three times a day (TID) | ORAL | 0 refills | Status: DC

## 2022-10-16 NOTE — Telephone Encounter (Signed)
Patient aware her rx for pain med is at the pharmacy

## 2022-10-27 ENCOUNTER — Other Ambulatory Visit: Payer: Self-pay | Admitting: Family Medicine

## 2022-11-01 ENCOUNTER — Other Ambulatory Visit: Payer: Self-pay | Admitting: Family Medicine

## 2022-11-01 DIAGNOSIS — G8929 Other chronic pain: Secondary | ICD-10-CM

## 2022-11-01 DIAGNOSIS — N184 Chronic kidney disease, stage 4 (severe): Secondary | ICD-10-CM

## 2022-11-10 ENCOUNTER — Encounter: Payer: Self-pay | Admitting: Family Medicine

## 2022-11-10 ENCOUNTER — Ambulatory Visit (INDEPENDENT_AMBULATORY_CARE_PROVIDER_SITE_OTHER): Payer: Medicare HMO | Admitting: Family Medicine

## 2022-11-10 VITALS — BP 120/70 | HR 67 | Ht 61.0 in | Wt 132.1 lb

## 2022-11-10 DIAGNOSIS — R221 Localized swelling, mass and lump, neck: Secondary | ICD-10-CM

## 2022-11-10 DIAGNOSIS — R220 Localized swelling, mass and lump, head: Secondary | ICD-10-CM | POA: Diagnosis not present

## 2022-11-10 DIAGNOSIS — Z0001 Encounter for general adult medical examination with abnormal findings: Secondary | ICD-10-CM

## 2022-11-10 DIAGNOSIS — R7303 Prediabetes: Secondary | ICD-10-CM

## 2022-11-10 DIAGNOSIS — Z748 Other problems related to care provider dependency: Secondary | ICD-10-CM

## 2022-11-10 DIAGNOSIS — I10 Essential (primary) hypertension: Secondary | ICD-10-CM | POA: Diagnosis not present

## 2022-11-10 DIAGNOSIS — E038 Other specified hypothyroidism: Secondary | ICD-10-CM | POA: Diagnosis not present

## 2022-11-10 DIAGNOSIS — Z5982 Transportation insecurity: Secondary | ICD-10-CM

## 2022-11-10 DIAGNOSIS — E785 Hyperlipidemia, unspecified: Secondary | ICD-10-CM

## 2022-11-10 DIAGNOSIS — E559 Vitamin D deficiency, unspecified: Secondary | ICD-10-CM | POA: Diagnosis not present

## 2022-11-10 DIAGNOSIS — G8929 Other chronic pain: Secondary | ICD-10-CM | POA: Diagnosis not present

## 2022-11-10 NOTE — Patient Instructions (Addendum)
F/U    In 3 months , call if you need me sooner  Fasting CBc, lipid, cmp and eGFr, hBa1C, tsh, vIT d, MAGNESIUM AND FREE T3 AND FREE T4 LEVELS NEXT WEEK   nURSE PLS REFER TO thn FOR TRANSPORT ASSISTANCE  YOU ARE BEING REFERRED FOR [place] OF LEFT NECK MASS  Thanks for choosing [facility], we consider it a privelige to serve you.

## 2022-11-11 DIAGNOSIS — R221 Localized swelling, mass and lump, neck: Secondary | ICD-10-CM | POA: Insufficient documentation

## 2022-11-11 DIAGNOSIS — Z748 Other problems related to care provider dependency: Secondary | ICD-10-CM | POA: Insufficient documentation

## 2022-11-11 DIAGNOSIS — Z0001 Encounter for general adult medical examination with abnormal findings: Secondary | ICD-10-CM | POA: Insufficient documentation

## 2022-11-11 DIAGNOSIS — R220 Localized swelling, mass and lump, head: Secondary | ICD-10-CM | POA: Insufficient documentation

## 2022-11-11 MED ORDER — METHADONE HCL 10 MG PO TABS
ORAL_TABLET | ORAL | 0 refills | Status: DC
Start: 2022-11-11 — End: 2023-02-05

## 2022-11-11 MED ORDER — METHADONE HCL 10 MG PO TABS
10.0000 mg | ORAL_TABLET | Freq: Three times a day (TID) | ORAL | 0 refills | Status: DC
Start: 2022-12-08 — End: 2023-02-05

## 2022-11-11 MED ORDER — METHADONE HCL 10 MG PO TABS
10.0000 mg | ORAL_TABLET | Freq: Three times a day (TID) | ORAL | 0 refills | Status: DC
Start: 2023-01-05 — End: 2023-02-05

## 2022-11-11 MED ORDER — METHADONE HCL 10 MG PO TABS: ORAL_TABLET | ORAL | 0 refills | Status: DC

## 2022-11-11 NOTE — Assessment & Plan Note (Signed)
Controlled, no change in medication  

## 2022-11-11 NOTE — Assessment & Plan Note (Signed)
6 month history, painless inc in size , needs [place]

## 2022-11-11 NOTE — Assessment & Plan Note (Signed)
Annual exam as documented. Immunization and cancer screening needs are specifically addressed at this visit.Refuses both

## 2022-11-11 NOTE — Assessment & Plan Note (Signed)
Needs to be referred to [facility] for assistance as able

## 2022-11-11 NOTE — Assessment & Plan Note (Signed)
Updated lab needed at/ before next visit. Patient educated about the importance of limiting  Carbohydrate intake , the need to commit to daily physical activity for a minimum of 30 minutes , and to commit weight loss. The fact that changes in all these areas will reduce or eliminate all together the development of diabetes is stressed.      Latest Ref Rng & Units 04/11/2022    2:02 PM 01/19/2022    2:05 PM 11/24/2021    2:42 PM 09/06/2021    2:20 PM 05/24/2021    2:28 PM  Diabetic Labs  HbA1c 4.8 - 5.6 %    5.4    Chol 100 - 199 mg/dL 010    932    HDL >35 mg/dL 64    52    Calc LDL 0 - 99 mg/dL 90    56    Triglycerides 0 - 149 mg/dL 97    573    Creatinine 0.57 - 1.00 mg/dL 2.20  2.54  2.70  6.23  2.30       11/10/2022    4:36 PM 11/10/2022    3:34 PM 11/10/2022    3:33 PM 07/20/2022    1:32 PM 07/20/2022    1:31 PM 04/20/2022    2:14 PM 11/30/2021    2:03 PM  BP/Weight  Systolic BP 120 150 153 138 140 131 146  Diastolic BP 70 72 72 62 68 71 76  Wt. (Lbs)   132.12  137 138 155  BMI   24.96 kg/m2  25.89 kg/m2 26.07 kg/m2 28.35 kg/m2      07/11/2018    1:20 PM 04/11/2017    2:40 PM  Foot/eye exam completion dates  Foot Form Completion Done Done

## 2022-11-11 NOTE — Assessment & Plan Note (Signed)
The patient's Controlled Substance registry is reviewed and compliance confirmed. Adequacy of  Pain control and level of function is assessed. Medication dosing is adjusted as deemed appropriate. Twelve weeks of medication is prescribed , with a follow up appointment between 11 to 12 weeks .  

## 2022-11-11 NOTE — Assessment & Plan Note (Signed)
Updated lab needed at/ before next visit.   

## 2022-11-11 NOTE — Assessment & Plan Note (Signed)
Hyperlipidemia:Low fat diet discussed and encouraged.   Lipid Panel  Lab Results  Component Value Date   CHOL 172 04/11/2022   HDL 64 04/11/2022   LDLCALC 90 04/11/2022   TRIG 97 04/11/2022   CHOLHDL 2.7 04/11/2022

## 2022-11-11 NOTE — Progress Notes (Signed)
Katelyn Rowland     MRN: [id number]      DOB: Jan 15, 1941  HPI: Patient is in for annual physical exam. C/o swollen gland/ mass under left jaw enlarging in size over several monts, non tender also hoarseness which is painless. Has transportation need issues will refer to [facility] Needs labs updated will return next week Refuses immunization recommended, again re offered also refuse cancer screening of any type  PE: BP 120/70   Pulse 67   Ht 5\' 1"  (1.549 m)   Wt 132 lb 1.9 oz (59.9 kg)   SpO2 93%   BMI 24.96 kg/m   Pleasant  female, alert and oriented x 3, in no cardio-pulmonary distress. Afebrile. HEENT No facial trauma or asymetry. Sinuses non tender.  Extra occullar muscles intact.. External ears normal, . Neck: supple, non tender dime sized left submandibular  mass,no JVD or thyromegaly.No bruits.  Chest: Clear to ascultation bilaterally.No crackles or wheezes. Non tender to palpation    Cardiovascular system; Heart sounds normal,  S1 and  S2 ,no S3.  No murmur, or thrill. Apical beat not displaced Peripheral pulses normal.  Abdomen: Soft, non tender,und.    Musculoskeletal exam: Decreased  ROM of spine, hips , shoulders and knees.  Neurologic: Cranial nerves 2 to 12 intact. Power, tone ,sensation  normal throughout.  disturbance in gait. Gross intentional tremor.  Skin: Intact, no ulceration, erythema , scaling or rash noted. Pigmentation normal throughout  Psych; Normal mood and affect. Judgement and concentration normal   Assessment & Plan:  Encounter for Medicare annual examination with abnormal findings Annual exam as documented. Immunization and cancer screening needs are specifically addressed at this visit.Refuses both   Encounter for chronic pain management The patient's Controlled Substance registry is reviewed and compliance confirmed. Adequacy of  Pain control and level of function is assessed. Medication dosing is adjusted as deemed  appropriate. Twelve weeks of medication is prescribed , with a follow up appointment between 11 to 12 weeks .   Hypothyroidism Updated lab needed at/ before next visit.   Essential hypertension Controlled, no change in medication   Hyperlipidemia LDL goal <100 Hyperlipidemia:Low fat diet discussed and encouraged.   Lipid Panel  Lab Results  Component Value Date   CHOL 172 04/11/2022   HDL 64 04/11/2022   LDLCALC 90 04/11/2022   TRIG 97 04/11/2022   CHOLHDL 2.7 04/11/2022       Prediabetes Updated lab needed at/ before next visit. Patient educated about the importance of limiting  Carbohydrate intake , the need to commit to daily physical activity for a minimum of 30 minutes , and to commit weight loss. The fact that changes in all these areas will reduce or eliminate all together the development of diabetes is stressed.      Latest Ref Rng & Units 04/11/2022    2:02 PM 01/19/2022    2:05 PM 11/24/2021    2:42 PM 09/06/2021    2:20 PM 05/24/2021    2:28 PM  Diabetic Labs  HbA1c 4.8 - 5.6 %    5.4    Chol 100 - 199 mg/dL 440    102    HDL >72 mg/dL 64    52    Calc LDL 0 - 99 mg/dL 90    56    Triglycerides 0 - 149 mg/dL 97    536    Creatinine 0.57 - 1.00 mg/dL 6.44  0.34  7.42  5.95  2.30  11/10/2022    4:36 PM 11/10/2022    3:34 PM 11/10/2022    3:33 PM 07/20/2022    1:32 PM 07/20/2022    1:31 PM 04/20/2022    2:14 PM 11/30/2021    2:03 PM  BP/Weight  Systolic BP 120 150 153 138 140 131 146  Diastolic BP 70 72 72 62 68 71 76  Wt. (Lbs)   132.12  137 138 155  BMI   24.96 kg/m2  25.89 kg/m2 26.07 kg/m2 28.35 kg/m2      07/11/2018    1:20 PM 04/11/2017    2:40 PM  Foot/eye exam completion dates  Foot Form Completion Done Done      Vitamin D deficiency Updated lab needed at/ before next visit.   Mass of left side of neck 6 month history, painless inc in size , needs [place]  Mass of left submandibular region Obtain US  Dependent for  transportation Needs to be referred to [facility] for assistance as able

## 2022-11-11 NOTE — Assessment & Plan Note (Signed)
Obtain US

## 2022-11-13 ENCOUNTER — Telehealth: Payer: Self-pay

## 2022-11-13 ENCOUNTER — Other Ambulatory Visit: Payer: Self-pay | Admitting: Family Medicine

## 2022-11-13 NOTE — Telephone Encounter (Signed)
   Telephone encounter was:  Successful.  11/13/2022 Name: Katelyn Rowland MRN: [id number] DOB: [birth year]  Katelyn Rowland is a 82 y.o. year old female who is a primary care patient of Kerri Perches, MD . The community resource team was consulted for assistance with Transportation Needs   Care guide performed the following interventions: Attempted to call patient but no answer spoke with patient's daughter Monte Fantasia. Cordelia Pen stated she would contact her mother and let her know the pickup time. Cordelia Pen stated that she may need to be there 15 minutes early at 11:15 am. Spoke with Geraldine Contras at [facility] patient is on their route. Pick up time is 11am for 11/22/22 appointment. I called Dee at [facility] to let her know the pickup time would need to be 15 minutes earlier.  Follow Up Plan:  No further follow up planned at this time. The patient has been provided with needed resources.  Montford Barg [facility]  [facility] Population Health Community Resource Care Guide   [email or web address]  ?? [phone]   Website: [email or web address]  [email or web address]

## 2022-11-13 NOTE — Telephone Encounter (Signed)
   Telephone encounter was:  Successful.  11/13/2022 Name: Katelyn Rowland MRN: [id number] DOB: [birth year]  Katelyn Rowland is a 82 y.o. year old female who is a primary care patient of Lodema Hong Milus Mallick, MD . The community resource team was consulted for assistance with Transportation Needs   Care guide performed the following interventions: Received a call from patient's daughter Katelyn Rowland patient does not need transportation for 11/22/22. Katelyn Rowland stated that her mother has the contact number for [facility] [phone] if needed. Left a message on voicemail for Katelyn Rowland at [facility] transportation to inform her transportation is no longer needed for 11/22/22 appointment.  Follow Up Plan:  No further follow up planned at this time. The patient has been provided with needed resources.  Katelyn Rowland [facility]  [facility] Population Health Community Resource Care Guide   [email or web address]  ?? [phone]   Website: [email or web address]  [email or web address]

## 2022-11-14 DIAGNOSIS — R7303 Prediabetes: Secondary | ICD-10-CM | POA: Diagnosis not present

## 2022-11-14 DIAGNOSIS — E785 Hyperlipidemia, unspecified: Secondary | ICD-10-CM | POA: Diagnosis not present

## 2022-11-14 DIAGNOSIS — E038 Other specified hypothyroidism: Secondary | ICD-10-CM | POA: Diagnosis not present

## 2022-11-14 DIAGNOSIS — E559 Vitamin D deficiency, unspecified: Secondary | ICD-10-CM | POA: Diagnosis not present

## 2022-11-14 DIAGNOSIS — I10 Essential (primary) hypertension: Secondary | ICD-10-CM | POA: Diagnosis not present

## 2022-11-15 LAB — CMP14+EGFR
ALT: 6 IU/L (ref 0–32)
AST: 14 IU/L (ref 0–40)
Albumin/Globulin Ratio: 1.8 (ref 1.2–2.2)
Albumin: 4.2 g/dL (ref 3.7–4.7)
Alkaline Phosphatase: 63 IU/L (ref 44–121)
BUN/Creatinine Ratio: 15 (ref 12–28)
BUN: 32 mg/dL — ABNORMAL HIGH (ref 8–27)
Bilirubin Total: 0.3 mg/dL (ref 0.0–1.2)
CO2: 22 mmol/L (ref 20–29)
Calcium: 9.4 mg/dL (ref 8.7–10.3)
Chloride: 102 mmol/L (ref 96–106)
Creatinine, Ser: 2.15 mg/dL — ABNORMAL HIGH (ref 0.57–1.00)
Globulin, Total: 2.3 g/dL (ref 1.5–4.5)
Glucose: 71 mg/dL (ref 70–99)
Potassium: 5 mmol/L (ref 3.5–5.2)
Sodium: 137 mmol/L (ref 134–144)
Total Protein: 6.5 g/dL (ref 6.0–8.5)
eGFR: 22 mL/min/{1.73_m2} — ABNORMAL LOW (ref >59)

## 2022-11-15 LAB — CBC
Hematocrit: 34.3 % (ref 34.0–46.6)
Hemoglobin: 11.4 g/dL (ref 11.1–15.9)
MCH: 31.4 pg (ref 26.6–33.0)
MCHC: 33.2 g/dL (ref 31.5–35.7)
MCV: 95 fL (ref 79–97)
Platelets: 211 10*3/uL (ref 150–450)
RBC: 3.63 x10E6/uL — ABNORMAL LOW (ref 3.77–5.28)
RDW: 11.8 % (ref 11.7–15.4)
WBC: 4.5 10*3/uL (ref 3.4–10.8)

## 2022-11-15 LAB — TSH: TSH: 0.051 u[IU]/mL — ABNORMAL LOW (ref 0.450–4.500)

## 2022-11-15 LAB — MAGNESIUM: Magnesium: 2.1 mg/dL (ref 1.6–2.3)

## 2022-11-15 LAB — T4, FREE: Free T4: 2.08 ng/dL — ABNORMAL HIGH (ref 0.82–1.77)

## 2022-11-15 LAB — LIPID PANEL
Chol/HDL Ratio: 2.7 ratio (ref 0.0–4.4)
Cholesterol, Total: 170 mg/dL (ref 100–199)
HDL: 62 mg/dL (ref >39)
LDL Chol Calc (NIH): 91 mg/dL (ref 0–99)
Triglycerides: 92 mg/dL (ref 0–149)
VLDL Cholesterol Cal: 17 mg/dL (ref 5–40)

## 2022-11-15 LAB — VITAMIN D 25 HYDROXY (VIT D DEFICIENCY, FRACTURES): Vit D, 25-Hydroxy: 41.7 ng/mL (ref 30.0–100.0)

## 2022-11-15 LAB — T3, FREE: T3, Free: 2.4 pg/mL (ref 2.0–4.4)

## 2022-11-15 LAB — HEMOGLOBIN A1C
Est. average glucose Bld gHb Est-mCnc: 100 mg/dL
Hgb A1c MFr Bld: 5.1 % (ref 4.8–5.6)

## 2022-11-16 MED ORDER — LEVOTHYROXINE SODIUM 100 MCG PO TABS: 100.0000 ug | ORAL_TABLET | Freq: Every day | ORAL | 0 refills | Status: DC

## 2022-11-16 NOTE — Addendum Note (Signed)
Addended by: Kerri Perches on: 11/16/2022 06:23 AM   Modules accepted: Orders

## 2022-11-22 ENCOUNTER — Ambulatory Visit (HOSPITAL_COMMUNITY): Payer: Medicare HMO

## 2022-11-29 ENCOUNTER — Telehealth: Payer: Self-pay | Admitting: Family Medicine

## 2022-11-29 ENCOUNTER — Other Ambulatory Visit: Payer: Self-pay

## 2022-11-29 DIAGNOSIS — E038 Other specified hypothyroidism: Secondary | ICD-10-CM

## 2022-11-29 DIAGNOSIS — G8929 Other chronic pain: Secondary | ICD-10-CM

## 2022-11-29 NOTE — Telephone Encounter (Signed)
Patient aware of lab results.

## 2022-11-29 NOTE — Telephone Encounter (Signed)
Patient returning lab result call.  

## 2022-11-30 ENCOUNTER — Other Ambulatory Visit: Payer: Self-pay | Admitting: Family Medicine

## 2022-11-30 DIAGNOSIS — G8929 Other chronic pain: Secondary | ICD-10-CM

## 2022-11-30 DIAGNOSIS — N184 Chronic kidney disease, stage 4 (severe): Secondary | ICD-10-CM

## 2022-12-01 ENCOUNTER — Ambulatory Visit (HOSPITAL_COMMUNITY)
Admission: RE | Admit: 2022-12-01 | Discharge: 2022-12-01 | Disposition: A | Discharge status: Home / Self Care | Payer: Medicare HMO | Source: Ambulatory Visit | Attending: Family Medicine | Admitting: Family Medicine

## 2022-12-01 DIAGNOSIS — R221 Localized swelling, mass and lump, neck: Secondary | ICD-10-CM | POA: Insufficient documentation

## 2022-12-01 DIAGNOSIS — R220 Localized swelling, mass and lump, head: Secondary | ICD-10-CM | POA: Diagnosis not present

## 2022-12-08 ENCOUNTER — Other Ambulatory Visit: Payer: Self-pay | Admitting: Family Medicine

## 2022-12-28 ENCOUNTER — Other Ambulatory Visit: Payer: Self-pay | Admitting: Family Medicine

## 2022-12-28 DIAGNOSIS — G8929 Other chronic pain: Secondary | ICD-10-CM

## 2022-12-28 DIAGNOSIS — N184 Chronic kidney disease, stage 4 (severe): Secondary | ICD-10-CM

## 2023-01-02 ENCOUNTER — Other Ambulatory Visit: Payer: Self-pay | Admitting: Family Medicine

## 2023-01-08 ENCOUNTER — Other Ambulatory Visit: Payer: Self-pay | Admitting: Family Medicine

## 2023-01-16 ENCOUNTER — Other Ambulatory Visit: Payer: Self-pay | Admitting: Family Medicine

## 2023-01-27 ENCOUNTER — Other Ambulatory Visit: Payer: Self-pay | Admitting: Family Medicine

## 2023-01-27 DIAGNOSIS — G8929 Other chronic pain: Secondary | ICD-10-CM

## 2023-01-27 DIAGNOSIS — N184 Chronic kidney disease, stage 4 (severe): Secondary | ICD-10-CM

## 2023-02-03 ENCOUNTER — Other Ambulatory Visit: Payer: Self-pay | Admitting: Family Medicine

## 2023-02-03 DIAGNOSIS — G8929 Other chronic pain: Secondary | ICD-10-CM

## 2023-02-05 ENCOUNTER — Other Ambulatory Visit: Payer: Self-pay | Admitting: Internal Medicine

## 2023-02-05 DIAGNOSIS — G8929 Other chronic pain: Secondary | ICD-10-CM

## 2023-02-05 MED ORDER — METHADONE HCL 10 MG PO TABS
ORAL_TABLET | ORAL | 0 refills | Status: DC
Start: 2023-02-05 — End: 2023-03-01

## 2023-02-15 ENCOUNTER — Encounter: Payer: Self-pay | Admitting: Family Medicine

## 2023-02-15 ENCOUNTER — Ambulatory Visit (INDEPENDENT_AMBULATORY_CARE_PROVIDER_SITE_OTHER): Payer: Medicare HMO | Admitting: Family Medicine

## 2023-02-15 ENCOUNTER — Ambulatory Visit (HOSPITAL_COMMUNITY)
Admission: RE | Admit: 2023-02-15 | Discharge: 2023-02-15 | Disposition: A | Discharge status: Home / Self Care | Payer: Medicare HMO | Source: Ambulatory Visit | Attending: Family Medicine | Admitting: Family Medicine

## 2023-02-15 VITALS — BP 150/74 | HR 76 | Ht 60.0 in | Wt 140.1 lb

## 2023-02-15 DIAGNOSIS — I10 Essential (primary) hypertension: Secondary | ICD-10-CM | POA: Diagnosis not present

## 2023-02-15 DIAGNOSIS — I83893 Varicose veins of bilateral lower extremities with other complications: Secondary | ICD-10-CM | POA: Insufficient documentation

## 2023-02-15 DIAGNOSIS — M7989 Other specified soft tissue disorders: Secondary | ICD-10-CM | POA: Diagnosis not present

## 2023-02-15 DIAGNOSIS — Z7189 Other specified counseling: Secondary | ICD-10-CM

## 2023-02-15 DIAGNOSIS — Z86718 Personal history of other venous thrombosis and embolism: Secondary | ICD-10-CM | POA: Diagnosis not present

## 2023-02-15 DIAGNOSIS — E785 Hyperlipidemia, unspecified: Secondary | ICD-10-CM | POA: Diagnosis not present

## 2023-02-15 DIAGNOSIS — E038 Other specified hypothyroidism: Secondary | ICD-10-CM | POA: Diagnosis not present

## 2023-02-15 MED ORDER — CARVEDILOL 3.125 MG PO TABS
3.1250 mg | ORAL_TABLET | Freq: Two times a day (BID) | ORAL | 3 refills | Status: DC
Start: 2023-02-15 — End: 2023-03-26

## 2023-02-15 NOTE — Patient Instructions (Addendum)
F/U in 2 weeks , re evaluate blood pressure and swelling, please bring all medications you take to the visit  You are referred for an [place] of your legs due to acute swelling, and I am also referring you to [facility]/ case management.  I will refer you to vascular and vein specialists if needed  Stop amlodipine as this is likely contributing to swelling  New for blood pressure is carvedilol one tablet at 11 am and one tablet at 11 pm  TSH , chem 7 and EGFR and Magnesium today  Thanks for choosing [facility], we consider it a privelige to serve you.

## 2023-02-15 NOTE — Progress Notes (Signed)
Katelyn Rowland     MRN: [id number]      DOB: 07/07/1941  Chief Complaint  Patient presents with   Follow-up    Follow up    HPI Katelyn Rowland is here for follow up and re-evaluation of chronic medical conditions, medication management and review of any available recent lab and radiology data.  Preventive health is updated, specifically  Cancer screening and Immunization.   Questions or concerns regarding consultations or procedures which the PT has had in the interim are  addressed. The PT denies any adverse reactions to current medications since the last visit.  Acute bilateral leg pain and swelling, started on 02/08/2023, no h/o trauma, urinating often, past h/o DVT x 2 in left leg, right leg more painful    ROS Denies recent fever or chills. Denies sinus pressure, nasal congestion, ear pain or sore throat. Denies chest congestion, productive cough or wand leg swelling Denies abdominal pain, nausea, vomiting,diarrhea or constipation.   Denies dysuria, frequency, hesitancy or incontinence. chronic joint pain, swelling and limitation in mobility. Denies headaches, seizures, numbness, or tingling. Denies depression, anxiety or insomnia. Denies skin break down or rash.   PE  BP (!) 150/74   Pulse 76   Ht 5' (1.524 m)   Wt 140 lb 1.9 oz (63.6 kg)   SpO2 95%   BMI 27.37 kg/m   Patient alert and oriented and in no cardiopulmonary distress.  HEENT: No facial asymmetry, EOMI,     Neck supple .  Chest: Clear to auscultation bilaterally.  CVS: S1, S2 no murmurs, no S3.Regular rate.  ABD: Soft non tender.   Ext: bilateral leg edema and erythema, calf tendernss on left which is more swollen  MS: Adequate ROM spine, shoulders, hips and knees.  Skin: Intact, no ulcerations or rash noted.  Psych: Good eye contact, normal affect. Memory intact not anxious or depressed appearing.  CNS: CN 2-12 intact, power,  normal throughout.no focal deficits noted.   Assessment &  Plan  Varicose veins of leg with swelling, bilateral H/o left DVT x 2 , stat US , negative for DVT  Essential hypertension unClontrolled , however need to d/c amlodipine due to leg swelling, new is twice daily coreg, clos f/u DASH diet and commitment to daily physical activity for a minimum of 30 minutes discussed and encouraged, as a part of hypertension management. The importance of attaining a healthy weight is also discussed.     02/15/2023    1:33 PM 02/15/2023    1:09 PM 02/15/2023    1:07 PM 11/10/2022    4:36 PM 11/10/2022    3:34 PM 11/10/2022    3:33 PM 07/20/2022    1:32 PM  BP/Weight  Systolic BP 150 177 162 120 150 153 138  Diastolic BP 74 74 73 70 72 72 62  Wt. (Lbs)   140.12   132.12   BMI   27.37 kg/m2   24.96 kg/m2     '   Hypothyroidism Updated lab needed at/ before next visit.   Hyperlipidemia LDL goal <100 Hyperlipidemia:Low fat diet discussed and encouraged.   Lipid Panel  Lab Results  Component Value Date   CHOL 170 11/14/2022   HDL 62 11/14/2022   LDLCALC 91 11/14/2022   TRIG 92 11/14/2022   CHOLHDL 2.7 11/14/2022     Controlled, no change in medication   Pain management contract discussed The patient's Controlled Substance registry is reviewed and compliance confirmed. Adequacy of  Pain control and  level of function is assessed. Medication dosing is adjusted as deemed appropriate. Twelve weeks of medication is prescribed , with a follow up appointment between 11 to 12 weeks . New contract signed needs urine tox screen also

## 2023-02-19 DIAGNOSIS — I83893 Varicose veins of bilateral lower extremities with other complications: Secondary | ICD-10-CM | POA: Insufficient documentation

## 2023-02-19 MED ORDER — METHADONE HCL 10 MG PO TABS: 10.0000 mg | ORAL_TABLET | Freq: Three times a day (TID) | ORAL | 0 refills | Status: AC

## 2023-02-19 MED ORDER — METHADONE HCL 10 MG PO TABS: 10.0000 mg | ORAL_TABLET | Freq: Three times a day (TID) | ORAL | 0 refills | Status: DC

## 2023-02-19 NOTE — Assessment & Plan Note (Signed)
Hyperlipidemia:Low fat diet discussed and encouraged.   Lipid Panel  Lab Results  Component Value Date   CHOL 170 11/14/2022   HDL 62 11/14/2022   LDLCALC 91 11/14/2022   TRIG 92 11/14/2022   CHOLHDL 2.7 11/14/2022     Controlled, no change in medication

## 2023-02-19 NOTE — Assessment & Plan Note (Signed)
The patient's Controlled Substance registry is reviewed and compliance confirmed. Adequacy of  Pain control and level of function is assessed. Medication dosing is adjusted as deemed appropriate. Twelve weeks of medication is prescribed , with a follow up appointment between 11 to 12 weeks . New contract signed needs urine tox screen also

## 2023-02-19 NOTE — Assessment & Plan Note (Signed)
Updated lab needed at/ before next visit.   

## 2023-02-19 NOTE — Assessment & Plan Note (Signed)
H/o left DVT x 2 , stat US , negative for DVT

## 2023-02-19 NOTE — Assessment & Plan Note (Addendum)
unClontrolled , however need to d/c amlodipine due to leg swelling, new is twice daily coreg, clos f/u DASH diet and commitment to daily physical activity for a minimum of 30 minutes discussed and encouraged, as a part of hypertension management. The importance of attaining a healthy weight is also discussed.     02/15/2023    1:33 PM 02/15/2023    1:09 PM 02/15/2023    1:07 PM 11/10/2022    4:36 PM 11/10/2022    3:34 PM 11/10/2022    3:33 PM 07/20/2022    1:32 PM  BP/Weight  Systolic BP 150 177 162 120 150 153 138  Diastolic BP 74 74 73 70 72 72 62  Wt. (Lbs)   140.12   132.12   BMI   27.37 kg/m2   24.96 kg/m2     '

## 2023-02-27 ENCOUNTER — Other Ambulatory Visit: Payer: Self-pay | Admitting: Family Medicine

## 2023-02-27 DIAGNOSIS — N184 Chronic kidney disease, stage 4 (severe): Secondary | ICD-10-CM

## 2023-03-01 ENCOUNTER — Ambulatory Visit (INDEPENDENT_AMBULATORY_CARE_PROVIDER_SITE_OTHER): Payer: Medicare HMO | Admitting: Family Medicine

## 2023-03-01 ENCOUNTER — Encounter: Payer: Self-pay | Admitting: Family Medicine

## 2023-03-01 VITALS — BP 170/77 | HR 62 | Ht 61.0 in | Wt 137.0 lb

## 2023-03-01 DIAGNOSIS — E038 Other specified hypothyroidism: Secondary | ICD-10-CM | POA: Diagnosis not present

## 2023-03-01 DIAGNOSIS — I10 Essential (primary) hypertension: Secondary | ICD-10-CM | POA: Diagnosis not present

## 2023-03-01 DIAGNOSIS — N184 Chronic kidney disease, stage 4 (severe): Secondary | ICD-10-CM | POA: Diagnosis not present

## 2023-03-01 DIAGNOSIS — M7989 Other specified soft tissue disorders: Secondary | ICD-10-CM

## 2023-03-01 DIAGNOSIS — G8929 Other chronic pain: Secondary | ICD-10-CM | POA: Diagnosis not present

## 2023-03-01 MED ORDER — TIZANIDINE HCL 4 MG PO TABS: ORAL_TABLET | ORAL | 5 refills | Status: DC

## 2023-03-01 MED ORDER — LISINOPRIL 2.5 MG PO TABS: 2.5000 mg | ORAL_TABLET | Freq: Every day | ORAL | 5 refills | Status: DC

## 2023-03-01 MED ORDER — PREGABALIN 25 MG PO CAPS
25.0000 mg | ORAL_CAPSULE | Freq: Every day | ORAL | 2 refills | Status: DC
Start: 2023-03-01 — End: 2023-11-07

## 2023-03-01 MED ORDER — TRAZODONE HCL 100 MG PO TABS: 100.0000 mg | ORAL_TABLET | Freq: Every day | ORAL | 5 refills | Status: DC

## 2023-03-01 MED ORDER — CALCITRIOL 0.25 MCG PO CAPS: ORAL_CAPSULE | ORAL | 2 refills | Status: DC

## 2023-03-01 MED ORDER — ESOMEPRAZOLE MAGNESIUM 40 MG PO CPDR: DELAYED_RELEASE_CAPSULE | ORAL | 5 refills | Status: DC

## 2023-03-01 NOTE — Patient Instructions (Addendum)
F/U in 2 months, call if you need me before  New fior burning pain is pregabalin one at bedtime  UDS today  TSH and chem 7 and EGFr today and Magnesium ( already ordered)  Blood pressure still above goal, I will increase med dose if needed aty next visit  Good that swelling is down  Thanks for choosing [facility], we consider it a privelige to serve you.

## 2023-03-02 MED ORDER — LEVOTHYROXINE SODIUM 100 MCG PO TABS: 100.0000 ug | ORAL_TABLET | Freq: Every day | ORAL | 2 refills | Status: DC

## 2023-03-02 NOTE — Progress Notes (Unsigned)
   Katelyn Rowland     MRN: [id number]      DOB: 1940-12-14  Chief Complaint  Patient presents with   Follow-up    Bp follow up     HPI Katelyn Rowland is here for follow up and re-evaluation of chronic medical conditions,in particular hypertension and leg swelling Since amlodipine has been d/c leg swelling has resolved, she is tolerating new BP medication well  medication management and review of any available recent lab and radiology data.  Preventive health is updated, specifically  Cancer screening and Immunization.   Questions or concerns regarding consultations or procedures which the PT has had in the interim are  addressed. The PT denies any adverse reactions to current medications since the last visit.  There are no new concerns.  There are no specific complaints   ROS Denies recent fever or chills. Denies sinus pressure, nasal congestion, ear pain or sore throat. Denies chest congestion, productive cough or wheezing. Denies chest pains, palpitations and leg swelling Denies abdominal pain, nausea, vomiting,diarrhea or constipation.   Denies dysuria, frequency, hesitancy or incontinence. Denies joint pain, swelling and limitation in mobility. Denies headaches, seizures, numbness, or tingling. Denies depression, anxiety or insomnia. Denies skin break down or rash.   PE  BP (!) 170/77 (BP Location: Left Arm, Patient Position: Sitting, Cuff Size: Normal)   Pulse 62   Ht 5\' 1"  (1.549 m)   Wt 137 lb 0.6 oz (62.2 kg)   SpO2 94%   BMI 25.89 kg/m   Patient alert and oriented and in no cardiopulmonary distress.  HEENT: No facial asymmetry, EOMI,     Neck supple .  Chest: Clear to auscultation bilaterally.  CVS: S1, S2 no murmurs, no S3.Regular rate.  ABD: Soft non tender.   Ext: No edema  MS: Adequate ROM spine, shoulders, hips and knees.  Skin: Intact, no ulcerations or rash noted.  Psych: Good eye contact, normal affect. Memory intact not anxious or depressed  appearing.  CNS: CN 2-12 intact, power,  normal throughout.no focal deficits noted.   Assessment & Plan  No problem-specific Assessment & Plan notes found for this encounter.

## 2023-03-05 MED ORDER — METHADONE HCL 10 MG PO TABS: 10.0000 mg | ORAL_TABLET | Freq: Three times a day (TID) | ORAL | 0 refills | Status: DC

## 2023-03-05 NOTE — Assessment & Plan Note (Signed)
Not at goal, pt requesting hold on med increase as just started medication Low sodium diet encouraged  Will address at next visit if BP remains elevated

## 2023-03-05 NOTE — Assessment & Plan Note (Signed)
Resolved with med change

## 2023-03-05 NOTE — Assessment & Plan Note (Signed)
Remain on current med dose

## 2023-03-05 NOTE — Assessment & Plan Note (Signed)
The patient's Controlled Substance registry is reviewed and compliance confirmed. Adequacy of  Pain control and level of function is assessed. Medication dosing is adjusted as deemed appropriate. Twelve weeks of medication is prescribed , with a follow up appointment between 11 to 12 weeks .  

## 2023-03-06 ENCOUNTER — Telehealth: Payer: Self-pay | Admitting: Family Medicine

## 2023-03-06 NOTE — Telephone Encounter (Signed)
Patient returning lab result call 

## 2023-03-06 NOTE — Telephone Encounter (Signed)
[id number] 

## 2023-03-06 NOTE — Telephone Encounter (Signed)
Patient aware of lab results.

## 2023-03-23 ENCOUNTER — Telehealth: Payer: Self-pay | Admitting: Family Medicine

## 2023-03-23 NOTE — Telephone Encounter (Signed)
Patient called said since she has started on this medication carvedilol (COREG) 3.125 MG tablet  been having headaches every since.   Pharmacy: [facility]

## 2023-03-26 ENCOUNTER — Encounter: Payer: Self-pay | Admitting: Family Medicine

## 2023-03-26 ENCOUNTER — Ambulatory Visit (INDEPENDENT_AMBULATORY_CARE_PROVIDER_SITE_OTHER): Payer: Medicare HMO

## 2023-03-26 ENCOUNTER — Other Ambulatory Visit: Payer: Self-pay | Admitting: Family Medicine

## 2023-03-26 VITALS — Ht 60.0 in | Wt 136.0 lb

## 2023-03-26 DIAGNOSIS — Z2821 Immunization not carried out because of patient refusal: Secondary | ICD-10-CM

## 2023-03-26 DIAGNOSIS — Z5320 Procedure and treatment not carried out because of patient's decision for unspecified reasons: Secondary | ICD-10-CM

## 2023-03-26 DIAGNOSIS — Z0000 Encounter for general adult medical examination without abnormal findings: Secondary | ICD-10-CM | POA: Diagnosis not present

## 2023-03-26 MED ORDER — NIFEDIPINE ER OSMOTIC RELEASE 30 MG PO TB24: 30.0000 mg | ORAL_TABLET | Freq: Every day | ORAL | 2 refills | Status: DC

## 2023-03-26 NOTE — Patient Instructions (Addendum)
Katelyn Rowland , Thank you for taking time to come for your Medicare Wellness Visit. I appreciate your ongoing commitment to your health goals. Please review the following plan we discussed and let me know if I can assist you in the future.   These are the goals we discussed:  Goals       Patient Stated (pt-stated)      Patient stated remain independent.         This is a list of the screening recommended for you and due dates:  Health Maintenance  Topic Date Due   Flu Shot  03/08/2023   COVID-19 Vaccine (1 - 2023-24 season) 06/20/2023*   Zoster (Shingles) Vaccine (1 of 2) 07/05/2023*   Pneumonia Vaccine (2 of 2 - PCV) 11/11/2023*   Medicare Annual Wellness Visit  03/25/2024   DEXA scan (bone density measurement)  Completed   HPV Vaccine  Aged Out   DTaP/Tdap/Td vaccine  Discontinued  *Topic was postponed. The date shown is not the original due date.    Advanced directives: Advance directive discussed with you today. Even though you declined this today, please call our office should you change your mind, and we can give you the proper paperwork for you to fill out. Advance care planning is a way to make decisions about medical care that fits your values in case you are ever unable to make these decisions for yourself.  Information on Advanced Care Planning can be found at [facility] Advance Health Care Directives Advance Health Care Directives ([email or web address]    Conditions/risks identified:  Vaccinations: declines all Influenza vaccine: recommend every Fall Pneumococcal vaccine: recommend once per lifetime Prevnar-20 Tdap vaccine: recommend every 10 years Shingles vaccine: recommend Shingrix which is 2 doses 2-6 months apart and over 90% effective     Covid-19: recommend 2 doses one month apart with a booster 6 months later  Managing Pain Without Opioids Opioids are strong medicines used to treat moderate to severe pain. For some people, especially those who  have long-term (chronic) pain, opioids may not be the best choice for pain management due to: Side effects like nausea, constipation, and sleepiness. The risk of addiction (opioid use disorder). The longer you take opioids, the greater your risk of addiction. Pain that lasts for more than 3 months is called chronic pain. Managing chronic pain usually requires more than one approach and is often provided by a team of health care providers working together (multidisciplinary approach). Pain management may be done at a pain management center or pain clinic. How to manage pain without the use of opioids Use non-opioid medicines Non-opioid medicines for pain may include: Over-the-counter or prescription non-steroidal anti-inflammatory drugs (NSAIDs). These may be the first medicines used for pain. They work well for muscle and bone pain, and they reduce swelling. Acetaminophen. This over-the-counter medicine may work well for milder pain but not swelling. Antidepressants. These may be used to treat chronic pain. A certain type of antidepressant (tricyclics) is often used. These medicines are given in lower doses for pain than when used for depression. Anticonvulsants. These are usually used to treat seizures but may also reduce nerve (neuropathic) pain. Muscle relaxants. These relieve pain caused by sudden muscle tightening (spasms). You may also use a pain medicine that is applied to the skin as a patch, cream, or gel (topical analgesic), such as a numbing medicine. These may cause fewer side effects than medicines taken by mouth. Do certain therapies as directed Some therapies  can help with pain management. They include: Physical therapy. You will do exercises to gain strength and flexibility. A physical therapist may teach you exercises to move and stretch parts of your body that are weak, stiff, or painful. You can learn these exercises at physical therapy visits and practice them at home. Physical  therapy may also involve: Massage. Heat wraps or applying heat or cold to affected areas. Electrical signals that interrupt pain signals (transcutaneous electrical nerve stimulation, TENS). Weak lasers that reduce pain and swelling (low-level laser therapy). Signals from your body that help you learn to regulate pain (biofeedback). Occupational therapy. This helps you to learn ways to function at home and work with less pain. Recreational therapy. This involves trying new activities or hobbies, such as a physical activity or drawing. Mental health therapy, including: Cognitive behavioral therapy (CBT). This helps you learn coping skills for dealing with pain. Acceptance and commitment therapy (ACT) to change the way you think and react to pain. Relaxation therapies, including muscle relaxation exercises and mindfulness-based stress reduction. Pain management counseling. This may be individual, family, or group counseling.  Receive medical treatments Medical treatments for pain management include: Nerve block injections. These may include a pain blocker and anti-inflammatory medicines. You may have injections: Near the spine to relieve chronic back or neck pain. Into joints to relieve back or joint pain. Into nerve areas that supply a painful area to relieve body pain. Into muscles (trigger point injections) to relieve some painful muscle conditions. A medical device placed near your spine to help block pain signals and relieve nerve pain or chronic back pain (spinal cord stimulation device). Acupuncture. Follow these instructions at home Medicines Take over-the-counter and prescription medicines only as told by your health care provider. If you are taking pain medicine, ask your health care providers about possible side effects to watch out for. Do not drive or use heavy machinery while taking prescription opioid pain medicine. Lifestyle  Do not use drugs or alcohol to reduce pain. If  you drink alcohol, limit how much you have to: 0-1 drink a day for women who are not pregnant. 0-2 drinks a day for men. Know how much alcohol is in a drink. In the [place] one drink equals one 12 oz bottle of beer (355 mL), one 5 oz glass of wine (148 mL), or one 1 oz glass of hard liquor (44 mL). Do not use any products that contain nicotine or tobacco. These products include cigarettes, chewing tobacco, and vaping devices, such as e-cigarettes. If you need help quitting, ask your health care provider. Eat a healthy diet and maintain a healthy weight. Poor diet and excess weight may make pain worse. Eat foods that are high in fiber. These include fresh fruits and vegetables, whole grains, and beans. Limit foods that are high in fat and processed sugars, such as fried and sweet foods. Exercise regularly. Exercise lowers stress and may help relieve pain. Ask your health care provider what activities and exercises are safe for you. If your health care provider approves, join an exercise class that combines movement and stress reduction. Examples include yoga and tai chi. Get enough sleep. Lack of sleep may make pain worse. Lower stress as much as possible. Practice stress reduction techniques as told by your therapist. General instructions Work with all your pain management providers to find the treatments that work best for you. You are an important member of your pain management team. There are many things you can do to  reduce pain on your own. Consider joining an online or in-person support group for people who have chronic pain. Keep all follow-up visits. This is important. Where to find more information You can find more information about managing pain without opioids from: American Academy of Pain Medicine: painmed.org Institute for Chronic Pain: instituteforchronicpain.org American Chronic Pain Association: theacpa.org Contact a health care provider if: You have side effects from pain  medicine. Your pain gets worse or does not get better with treatments or home therapy. You are struggling with anxiety or depression. Summary Many types of pain can be managed without opioids. Chronic pain may respond better to pain management without opioids. Pain is best managed when you and a team of health care providers work together. Pain management without opioids may include non-opioid medicines, medical treatments, physical therapy, mental health therapy, and lifestyle changes. Tell your health care providers if your pain gets worse or is not being managed well enough. This information is not intended to replace advice given to you by your health care provider. Make sure you discuss any questions you have with your health care provider. Document Revised: 11/03/2020 Document Reviewed: 11/03/2020 Elsevier Patient Education  2024 Elsevier Inc.  Next appointment: VIRTUAL/TELEPHONE APPOINTMENT Follow up in one year for your annual wellness visit  May 14, 2024 at 2:00 pm telephone visit.    Preventive Care 25 Years and Older, Female Preventive care refers to lifestyle choices and visits with your health care provider that can promote health and wellness. What does preventive care include? A yearly physical exam. This is also called an annual well check. Dental exams once or twice a year. Routine eye exams. Ask your health care provider how often you should have your eyes checked. Personal lifestyle choices, including: Daily care of your teeth and gums. Regular physical activity. Eating a healthy diet. Avoiding tobacco and drug use. Limiting alcohol use. Practicing safe sex. Taking low-dose aspirin every day. Taking vitamin and mineral supplements as recommended by your health care provider. What happens during an annual well check? The services and screenings done by your health care provider during your annual well check will depend on your age, overall health, lifestyle risk  factors, and family history of disease. Counseling  Your health care provider may ask you questions about your: Alcohol use. Tobacco use. Drug use. Emotional well-being. Home and relationship well-being. Sexual activity. Eating habits. History of falls. Memory and ability to understand (cognition). Work and work Astronomer. Reproductive health. Screening  You may have the following tests or measurements: Height, weight, and BMI. Blood pressure. Lipid and cholesterol levels. These may be checked every 5 years, or more frequently if you are over 62 years old. Skin check. Lung cancer screening. You may have this screening every year starting at age 37 if you have a 30-pack-year history of smoking and currently smoke or have quit within the past 15 years. Fecal occult blood test (FOBT) of the stool. You may have this test every year starting at age 24. Flexible sigmoidoscopy or colonoscopy. You may have a sigmoidoscopy every 5 years or a colonoscopy every 10 years starting at age 17. Hepatitis C blood test. Hepatitis B blood test. Sexually transmitted disease (STD) testing. Diabetes screening. This is done by checking your blood sugar (glucose) after you have not eaten for a while (fasting). You may have this done every 1-3 years. Bone density scan. This is done to screen for osteoporosis. You may have this done starting at age 65. Mammogram. This  may be done every 1-2 years. Talk to your health care provider about how often you should have regular mammograms. Talk with your health care provider about your test results, treatment options, and if necessary, the need for more tests. Vaccines  Your health care provider may recommend certain vaccines, such as: Influenza vaccine. This is recommended every year. Tetanus, diphtheria, and acellular pertussis (Tdap, Td) vaccine. You may need a Td booster every 10 years. Zoster vaccine. You may need this after age 42. Pneumococcal 13-valent  conjugate (PCV13) vaccine. One dose is recommended after age 20. Pneumococcal polysaccharide (PPSV23) vaccine. One dose is recommended after age 26. Talk to your health care provider about which screenings and vaccines you need and how often you need them. This information is not intended to replace advice given to you by your health care provider. Make sure you discuss any questions you have with your health care provider. Document Released: 08/20/2015 Document Revised: 04/12/2016 Document Reviewed: 05/25/2015 Elsevier Interactive Patient Education  2017 [employer].  Fall Prevention in the Home Falls can cause injuries. They can happen to people of all ages. There are many things you can do to make your home safe and to help prevent falls. What can I do on the outside of my home? Regularly fix the edges of walkways and driveways and fix any cracks. Remove anything that might make you trip as you walk through a door, such as a raised step or threshold. Trim any bushes or trees on the path to your home. Use bright outdoor lighting. Clear any walking paths of anything that might make someone trip, such as rocks or tools. Regularly check to see if handrails are loose or broken. Make sure that both sides of any steps have handrails. Any raised decks and porches should have guardrails on the edges. Have any leaves, snow, or ice cleared regularly. Use sand or salt on walking paths during winter. Clean up any spills in your garage right away. This includes oil or grease spills. What can I do in the bathroom? Use night lights. Install grab bars by the toilet and in the tub and shower. Do not use towel bars as grab bars. Use non-skid mats or decals in the tub or shower. If you need to sit down in the shower, use a plastic, non-slip stool. Keep the floor dry. Clean up any water that spills on the floor as soon as it happens. Remove soap buildup in the tub or shower regularly. Attach bath mats  securely with double-sided non-slip rug tape. Do not have throw rugs and other things on the floor that can make you trip. What can I do in the bedroom? Use night lights. Make sure that you have a light by your bed that is easy to reach. Do not use any sheets or blankets that are too big for your bed. They should not hang down onto the floor. Have a firm chair that has side arms. You can use this for support while you get dressed. Do not have throw rugs and other things on the floor that can make you trip. What can I do in the kitchen? Clean up any spills right away. Avoid walking on wet floors. Keep items that you use a lot in easy-to-reach places. If you need to reach something above you, use a strong step stool that has a grab bar. Keep electrical cords out of the way. Do not use floor polish or wax that makes floors slippery. If you must  use wax, use non-skid floor wax. Do not have throw rugs and other things on the floor that can make you trip. What can I do with my stairs? Do not leave any items on the stairs. Make sure that there are handrails on both sides of the stairs and use them. Fix handrails that are broken or loose. Make sure that handrails are as long as the stairways. Check any carpeting to make sure that it is firmly attached to the stairs. Fix any carpet that is loose or worn. Avoid having throw rugs at the top or bottom of the stairs. If you do have throw rugs, attach them to the floor with carpet tape. Make sure that you have a light switch at the top of the stairs and the bottom of the stairs. If you do not have them, ask someone to add them for you. What else can I do to help prevent falls? Wear shoes that: Do not have high heels. Have rubber bottoms. Are comfortable and fit you well. Are closed at the toe. Do not wear sandals. If you use a stepladder: Make sure that it is fully opened. Do not climb a closed stepladder. Make sure that both sides of the stepladder  are locked into place. Ask someone to hold it for you, if possible. Clearly mark and make sure that you can see: Any grab bars or handrails. First and last steps. Where the edge of each step is. Use tools that help you move around (mobility aids) if they are needed. These include: Canes. Walkers. Scooters. Crutches. Turn on the lights when you go into a dark area. Replace any light bulbs as soon as they burn out. Set up your furniture so you have a clear path. Avoid moving your furniture around. If any of your floors are uneven, fix them. If there are any pets around you, be aware of where they are. Review your medicines with your doctor. Some medicines can make you feel dizzy. This can increase your chance of falling. Ask your doctor what other things that you can do to help prevent falls. This information is not intended to replace advice given to you by your health care provider. Make sure you discuss any questions you have with your health care provider. Document Released: 05/20/2009 Document Revised: 12/30/2015 Document Reviewed: 08/28/2014 Elsevier Interactive Patient Education  2017 [employer].

## 2023-03-26 NOTE — Progress Notes (Signed)
 Because this visit was a virtual/telehealth visit,  certain criteria was not obtained, such a blood pressure, CBG if patient is a diabetic, and timed get up and go. Any medications not marked as "taking" was not mentioned during the medication reconciliation part of the visit. Any vitals not documented were not able to be obtained due to this being a telehealth visit. Vitals that have been documented are verbally provided by the patient.  Patient was unable to self-report a recent blood pressure reading due to a lack of equipment at home via telehealth.  Subjective:   Katelyn Rowland is a 82 y.o. female who presents for Medicare Annual (Subsequent) preventive examination.  Visit Complete: Virtual  I connected with  Katelyn Rowland on 03/26/23 by a audio enabled telemedicine application and verified that I am speaking with the correct person using two identifiers.  Patient Location: Home  Provider Location: Home Office  I discussed the limitations of evaluation and management by telemedicine. The patient expressed understanding and agreed to proceed.  Patient Medicare AWV questionnaire was completed by the patient on n/a; I have confirmed that all information answered by patient is correct and no changes since this date.  Review of Systems     Cardiac Risk Factors include: advanced age (>47men, >71 women);dyslipidemia;hypertension;sedentary lifestyle;Other (see comment)     Objective:    Today's Vitals   03/26/23 1445 03/26/23 1514  Weight: 136 lb (61.7 kg)   Height: 5' (1.524 m)   PainSc:  0-No pain   Body mass index is 26.56 kg/m.     03/26/2023    2:45 PM 02/28/2022    1:48 PM 02/28/2022    1:42 PM 01/26/2021    1:09 PM 03/02/2019    1:47 PM 01/07/2018    1:44 PM 03/29/2016    2:11 PM  Advanced Directives  Does Patient Have a Medical Advance Directive? No  Yes No;Yes No Yes No  Type of Best boy of [place];Living will Living will;Healthcare Power of  Attorney  Out of facility DNR (pink MOST or yellow form);Living will;Healthcare Power of [employer] of Healthcare Power of Attorney in Chart?  Yes - validated most recent copy scanned in chart (See row information) Yes - validated most recent copy scanned in chart (See row information) Yes - validated most recent copy scanned in chart (See row information)  Yes   Would patient like information on creating a medical advance directive? No - Patient declined   No - Patient declined No - Patient declined  No - patient declined information  Pre-existing out of facility DNR order (yellow form or pink MOST form)      Yellow form placed in chart (order not valid for inpatient use)     Current Medications (verified) Outpatient Encounter Medications as of 03/26/2023  Medication Sig   calcitRIOL (ROCALTROL) 0.25 MCG capsule TAKE (1) CAPSULE BY MOUTH THREE TIMES WEEKLY.   esomeprazole (NEXIUM) 40 MG capsule Take one capsule by mouth once daily before breakfast   levothyroxine (SYNTHROID) 100 MCG tablet Take 1 tablet (100 mcg total) by mouth daily.   lisinopril (ZESTRIL) 2.5 MG tablet Take 1 tablet (2.5 mg total) by mouth daily.   methadone (DOLOPHINE) 10 MG tablet Take 1 tablet (10 mg total) by mouth every 8 (eight) hours for 28 days.   [START ON 04/02/2023] methadone (DOLOPHINE) 10 MG tablet Take 1 tablet (10 mg total) by mouth every 8 (eight) hours for 28 days.   [  START ON 04/30/2023] methadone (DOLOPHINE) 10 MG tablet Take 1 tablet (10 mg total) by mouth every 8 (eight) hours.   methadone (DOLOPHINE) 10 MG tablet Take 1 tablet (10 mg total) by mouth every 8 (eight) hours.   pregabalin (LYRICA) 25 MG capsule Take 1 capsule (25 mg total) by mouth at bedtime.   tiZANidine (ZANAFLEX) 4 MG tablet TAKE 1 TABLET BY MOUTH TWICE DAILY FOR SPASM.   traZODone (DESYREL) 100 MG tablet Take 1 tablet (100 mg total) by mouth at bedtime.   diphenhydrAMINE (BENADRYL) 25 mg capsule Take 25 mg by mouth every 8 (eight)  hours as needed for allergies.   naloxone (NARCAN) nasal spray 4 mg/0.1 mL One spray into each nostril once, as needed   NIFEdipine (PROCARDIA-XL/NIFEDICAL-XL) 30 MG 24 hr tablet Take 1 tablet (30 mg total) by mouth daily.   polyethylene glycol powder (GLYCOLAX/MIRALAX) powder MIX 1 CAPFUL (17 GRAMS) WITH 8OZ OF WATER OR JUICE DAILY.   No facility-administered encounter medications on file as of 03/26/2023.    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, recurrent most recent hopitalisation    Edema of both legs    GERD (gastroesophageal reflux disease)    Headache(784.0)    Hematoma of leg    left    High blood pressure    Hypertension    margaret simpson  [id number]   Muscle cramps    Neck pain    PONV (postoperative nausea and vomiting)    Reflux    Thyroid disorder    Past Surgical History:  Procedure Laterality Date   ABDOMINAL HYSTERECTOMY     APPENDECTOMY     BIOPSY N/A 11/20/2014   Procedure: BIOPSY;  Surgeon: Malissa Hippo, MD;  Location: [facility];  Service: Endoscopy;  Laterality: N/A;   CERVICAL SPINE SURGERY     ESOPHAGEAL DILATION N/A 11/20/2014   Procedure: ESOPHAGEAL DILATION 56 FRENCH MALONEY;  Surgeon: Malissa Hippo, MD;  Location: [facility];  Service: Endoscopy;  Laterality: N/A;   ESOPHAGOGASTRODUODENOSCOPY (EGD) WITH PROPOFOL N/A 11/20/2014   Procedure: ESOPHAGOGASTRODUODENOSCOPY (EGD) WITH PROPOFOL;  Surgeon: Malissa Hippo, MD;  Location: [facility];  Service: Endoscopy;  Laterality: N/A;   FOOT NEUROMA SURGERY     right    ivc filter placement, following recurrent left DVT  07/2009   NASAL SINUS SURGERY     THORACIC DISCECTOMY   06/12/2012   Procedure: THORACIC DISCECTOMY;  Surgeon: Karn Cassis, MD;  Location: [facility];  Service: Neurosurgery;  Laterality: N/A;  Thoracic Ten-Eleven Thoracic Laminectomy   TONSILLECTOMY     TONSILLECTOMY AND ADENOIDECTOMY     unilateal oopherectomy     VESICOVAGINAL FISTULA CLOSURE W/ TAH     Family History  Problem Relation Age of Onset   Emphysema Sister    Kidney disease Sister    Diabetes Mother    Diabetes Father    Diabetes Brother    Arthritis Other    Kidney disease Other    Social History   Socioeconomic History   Marital status: Widowed    Spouse name: Not on file   Number of children: 4  Years of education: 65   Highest education level: Not on file  Occupational History   Occupation: disabled     Employer: RETIRED  Tobacco Use   Smoking status: Former    Current packs/day: 0.00    Average packs/day: 1 pack/day for 42.0 years (42.0 ttl pk-yrs)    Types: Cigarettes    Start date: 06/04/1968    Quit date: 06/04/2010    Years since quitting: 12.8   Smokeless tobacco: Never  Vaping Use   Vaping status: Never Used  Substance and Sexual Activity   Alcohol use: No   Drug use: No   Sexual activity: Never    Birth control/protection: Surgical  Other Topics Concern   Not on file  Social History Narrative   Not on file   Social Determinants of Health   Financial Resource Strain: Low Risk  (03/26/2023)   Overall Financial Resource Strain (CARDIA)    Difficulty of Paying Living Expenses: Not hard at all  Food Insecurity: No Food Insecurity (03/26/2023)   Hunger Vital Sign    Worried About Running Out of Food in the Last Year: Never true    Ran Out of Food in the Last Year: Never true  Transportation Needs: No Transportation Needs (03/26/2023)   PRAPARE - Administrator, Civil Service (Medical): No    Lack of Transportation (Non-Medical): No  Physical Activity: Inactive (03/26/2023)   Exercise Vital Sign    Days of Exercise per Week:  0 days    Minutes of Exercise per Session: 0 min  Stress: No Stress Concern Present (03/26/2023)   [employer] of Occupational Health - Occupational Stress Questionnaire    Feeling of Stress : Not at all  Social Connections: Moderately Isolated (03/26/2023)   Social Connection and Isolation Panel [NHANES]    Frequency of Communication with Friends and Family: More than three times a week    Frequency of Social Gatherings with Friends and Family: More than three times a week    Attends Religious Services: More than 4 times per year    Active Member of [employer] or Organizations: No    Attends Banker Meetings: Never    Marital Status: Widowed    Tobacco Counseling Counseling given: Yes   Clinical Intake:  Pre-visit preparation completed: Yes  Pain : No/denies pain Pain Score: 0-No pain     BMI - recorded: 26.56 Nutritional Status: BMI 25 -29 Overweight Nutritional Risks: None Diabetes: No  How often do you need to have someone help you when you read instructions, pamphlets, or other written materials from your doctor or pharmacy?: 1 - Never  Interpreter Needed?: No  Information entered by ::  Salem Mastrogiovanni, CMA   Activities of Daily Living    03/26/2023    3:21 PM  In your present state of health, do you have any difficulty performing the following activities:  Hearing? 0  Vision? 0  Difficulty concentrating or making decisions? 0  Walking or climbing stairs? 0  Dressing or bathing? 0  Doing errands, shopping? 0  Preparing Food and eating ? N  Using the Toilet? N  In the past six months, have you accidently leaked urine? N  Do you have problems with loss of bowel control? N  Managing your Medications? N  Managing your Finances? N  Housekeeping or managing your Housekeeping? N    Patient Care Team: Kerri Perches, MD as PCP - General  Indicate any recent Medical Services you may have received  from other than [facility] providers in the past year  (date may be approximate).     Assessment:   This is a routine wellness examination for Margarit.  Hearing/Vision screen Hearing Screening - Comments:: Patient states she may have a little difficulty hearing but declines a referral for a hearing test.  Vision Screening - Comments:: Patient does not see an eye doctor and declines a referral to have an eye exam.   Dietary issues and exercise activities discussed:     Goals Addressed               This Visit's Progress     Patient Stated (pt-stated)        Patient stated remain independent.        Depression Screen    03/26/2023    3:17 PM 03/01/2023    2:09 PM 02/15/2023    1:10 PM 11/10/2022    3:34 PM 07/20/2022    1:32 PM 04/20/2022    2:14 PM 02/28/2022    1:42 PM  PHQ 2/9 Scores  PHQ - 2 Score 0 0 0 0 0 0 0  PHQ- 9 Score 0 1  2       Fall Risk    03/26/2023    3:21 PM 03/01/2023    2:09 PM 02/15/2023    1:10 PM 11/10/2022    3:34 PM 07/20/2022    1:32 PM  Fall Risk   Falls in the past year? 0 0 0 0 0  Number falls in past yr: 0 0 0 0 0  Injury with Fall? 0 0 0 0 0  Risk for fall due to : No Fall Risks No Fall Risks No Fall Risks No Fall Risks No Fall Risks  Follow up Falls prevention discussed Falls evaluation completed Falls evaluation completed Falls evaluation completed Falls evaluation completed    MEDICARE RISK AT HOME: Medicare Risk at Home Any stairs in or around the home?: Yes If so, are there any without handrails?: No Home free of loose throw rugs in walkways, pet beds, electrical cords, etc?: Yes Adequate lighting in your home to reduce risk of falls?: Yes Life alert?: No Use of a cane, walker or w/c?: No Grab bars in the bathroom?: Yes Shower chair or bench in shower?: Yes Elevated toilet seat or a handicapped toilet?: No  TIMED UP AND GO:  Was the test performed?  No    Cognitive Function:    12/10/2015    2:15 PM  MMSE - Mini Mental State Exam  Orientation to time 5  Orientation to Place  5  Registration 3  Attention/ Calculation 5  Recall 3  Language- name 2 objects 2  Language- repeat 1  Language- follow 3 step command 3  Language- read & follow direction 1  Write a sentence 1  Copy design 0  Total score 29        03/26/2023    3:15 PM 02/28/2022    1:43 PM 01/26/2021    1:16 PM 01/26/2020    1:24 PM 01/15/2019   11:09 AM  6CIT Screen  What Year? 0 points 0 points 0 points 0 points 0 points  What month? 0 points 0 points 0 points 0 points 0 points  What time? 0 points 0 points 0 points 0 points 0 points  Count back from 20 0 points 0 points 0 points 0 points 0 points  Months in reverse 0 points 0 points 0 points 0 points 0 points  Repeat phrase 0 points 0 points 0 points 2 points 0 points  Total Score 0 points 0 points 0 points 2 points 0 points    Immunizations Immunization History  Administered Date(s) Administered   Pneumococcal Polysaccharide-23 03/26/2006   Td 12/30/2003    TDAP status: Due, Education has been provided regarding the importance of this vaccine. Advised may receive this vaccine at local pharmacy or Health Dept. Aware to provide a copy of the vaccination record if obtained from local pharmacy or Health Dept. Verbalized acceptance and understanding.  Flu Vaccine status: Due, Education has been provided regarding the importance of this vaccine. Advised may receive this vaccine at local pharmacy or Health Dept. Aware to provide a copy of the vaccination record if obtained from local pharmacy or Health Dept. Verbalized acceptance and understanding.  Pneumococcal vaccine status: Declined,  Education has been provided regarding the importance of this vaccine but patient still declined. Advised may receive this vaccine at local pharmacy or Health Dept. Aware to provide a copy of the vaccination record if obtained from local pharmacy or Health Dept. Verbalized acceptance and understanding.   Covid-19 vaccine status: Declined, Education has been  provided regarding the importance of this vaccine but patient still declined. Advised may receive this vaccine at local pharmacy or Health Dept.or vaccine clinic. Aware to provide a copy of the vaccination record if obtained from local pharmacy or Health Dept. Verbalized acceptance and understanding.  Qualifies for Shingles Vaccine? Yes   Zostavax completed No   Shingrix Completed?: No.    Education has been provided regarding the importance of this vaccine. Patient has been advised to call insurance company to determine out of pocket expense if they have not yet received this vaccine. Advised may also receive vaccine at local pharmacy or Health Dept. Verbalized acceptance and understanding.  Screening Tests Health Maintenance  Topic Date Due   INFLUENZA VACCINE  03/08/2023   COVID-19 Vaccine (1 - 2023-24 season) 06/20/2023 (Originally 04/07/2022)   Zoster Vaccines- Shingrix (1 of 2) 07/05/2023 (Originally 01/21/1990)   Pneumonia Vaccine 57+ Years old (2 of 2 - PCV) 11/11/2023 (Originally 03/27/2007)   Medicare Annual Wellness (AWV)  11/11/2023   DEXA SCAN  Completed   HPV VACCINES  Aged Out   DTaP/Tdap/Td  Discontinued    Health Maintenance  Health Maintenance Due  Topic Date Due   INFLUENZA VACCINE  03/08/2023    Colorectal cancer screening: No longer required.   Mammogram status: No longer required due to age.  Bone Density status: Completed 06/25/2020. Results reflect: Bone density results: OSTEOPOROSIS. Repeat every 2 years. Patient declines repeat scanning  Lung Cancer Screening: (Low Dose CT Chest recommended if Age 74-80 years, 20 pack-year currently smoking OR have quit w/in 15years.) does not qualify.   Additional Screening:  Hepatitis C Screening: does not qualify;   Vision Screening: Recommended annual ophthalmology exams for early detection of glaucoma and other disorders of the eye. Is the patient up to date with their annual eye exam?  No  Who is the provider or  what is the name of the office in which the patient attends annual eye exams? Does not see an eye doctor and declines referral If pt is not established with a provider, would they like to be referred to a provider to establish care? No .   Dental Screening: Recommended annual dental exams for proper oral hygiene  Diabetic Foot Exam: n/a  Community Resource Referral / Chronic Care Management: CRR required this visit?  No  CCM required this visit?  No     Plan:     I have personally reviewed and noted the following in the patient's chart:   Medical and social history Use of alcohol, tobacco or illicit drugs  Current medications and supplements including opioid prescriptions. Patient is currently taking opioid prescriptions. Information provided to patient regarding non-opioid alternatives. Patient advised to discuss non-opioid treatment plan with their provider. Functional ability and status Nutritional status Physical activity Advanced directives List of other physicians Hospitalizations, surgeries, and ER visits in previous 12 months Vitals Screenings to include cognitive, depression, and falls Referrals and appointments  In addition, I have reviewed and discussed with patient certain preventive protocols, quality metrics, and best practice recommendations. A written personalized care plan for preventive services as well as general preventive health recommendations were provided to patient.     Jordan Hawks Ramondo Dietze, CMA   03/26/2023   After Visit Summary: (Mail) Due to this being a telephonic visit, the after visit summary with patients personalized plan was offered to patient via mail   Nurse Notes: Patient declined all vaccines and repeat DEXA scan.

## 2023-03-27 NOTE — Telephone Encounter (Signed)
[id number] 

## 2023-03-27 NOTE — Telephone Encounter (Signed)
Patient returning Katie's call

## 2023-03-28 NOTE — Telephone Encounter (Signed)
[id number] 

## 2023-05-09 ENCOUNTER — Ambulatory Visit: Payer: Medicare HMO | Admitting: Family Medicine

## 2023-05-22 ENCOUNTER — Ambulatory Visit (INDEPENDENT_AMBULATORY_CARE_PROVIDER_SITE_OTHER): Payer: Medicare HMO | Admitting: Family Medicine

## 2023-05-22 ENCOUNTER — Encounter: Payer: Self-pay | Admitting: Family Medicine

## 2023-05-22 VITALS — BP 118/66 | HR 75 | Ht 61.0 in | Wt 140.1 lb

## 2023-05-22 DIAGNOSIS — G8929 Other chronic pain: Secondary | ICD-10-CM | POA: Diagnosis not present

## 2023-05-22 DIAGNOSIS — E038 Other specified hypothyroidism: Secondary | ICD-10-CM | POA: Diagnosis not present

## 2023-05-22 DIAGNOSIS — N184 Chronic kidney disease, stage 4 (severe): Secondary | ICD-10-CM

## 2023-05-22 DIAGNOSIS — R4589 Other symptoms and signs involving emotional state: Secondary | ICD-10-CM

## 2023-05-22 DIAGNOSIS — E785 Hyperlipidemia, unspecified: Secondary | ICD-10-CM | POA: Diagnosis not present

## 2023-05-22 DIAGNOSIS — Z2882 Immunization not carried out because of caregiver refusal: Secondary | ICD-10-CM | POA: Diagnosis not present

## 2023-05-22 DIAGNOSIS — I10 Essential (primary) hypertension: Secondary | ICD-10-CM | POA: Diagnosis not present

## 2023-05-22 NOTE — Patient Instructions (Addendum)
F/u in 13 weeks, call if you need me sooner  Pls add grandson Preston Fleeting to [place], he s the number one of 3 , tele [phone]  I know that going  to Senior center which is lovely in [place], will help you a lot and what you need, I will speak with Elmarie Shiley   Fasting lipid, cmp and EGFR, TSH first week in December  Thanks for choosing [facility], we consider it a privelige to serve you.

## 2023-05-23 ENCOUNTER — Other Ambulatory Visit: Payer: Self-pay | Admitting: Family Medicine

## 2023-05-31 ENCOUNTER — Other Ambulatory Visit: Payer: Self-pay | Admitting: Family Medicine

## 2023-05-31 DIAGNOSIS — N184 Chronic kidney disease, stage 4 (severe): Secondary | ICD-10-CM

## 2023-06-04 ENCOUNTER — Encounter: Payer: Self-pay | Admitting: Family Medicine

## 2023-06-04 DIAGNOSIS — R4589 Other symptoms and signs involving emotional state: Secondary | ICD-10-CM | POA: Insufficient documentation

## 2023-06-04 MED ORDER — METHADONE HCL 10 MG PO TABS: ORAL_TABLET | ORAL | 0 refills | Status: AC

## 2023-06-04 MED ORDER — METHADONE HCL 10 MG PO TABS: ORAL_TABLET | ORAL | 0 refills | Status: DC

## 2023-06-04 NOTE — Assessment & Plan Note (Signed)
Updated lab needed at/ before next visit.   

## 2023-06-04 NOTE — Assessment & Plan Note (Signed)
Hyperlipidemia:Low fat diet discussed and encouraged.   Lipid Panel  Lab Results  Component Value Date   CHOL 170 11/14/2022   HDL 62 11/14/2022   LDLCALC 91 11/14/2022   TRIG 92 11/14/2022   CHOLHDL 2.7 11/14/2022     Updated lab needed at/ before next visit.

## 2023-06-04 NOTE — Assessment & Plan Note (Signed)
Reports feeling isolated, has limitation oin transport , has buried 3 of her 4 children and her sppouse, interested in getting out to senior center will reach out to grandson for help

## 2023-06-04 NOTE — Assessment & Plan Note (Signed)
Controlled, no change in medication  

## 2023-06-04 NOTE — Progress Notes (Signed)
Katelyn Rowland     MRN: [id number]      DOB: [birth year]  Chief Complaint  Patient presents with   Follow-up    Follow up    HPI Katelyn Rowland is here for follow up and re-evaluation of chronic medical conditions, medication management and review of any available recent lab and radiology data.  Preventive health is updated, specifically   Immunization.  Which she continues to refuse The PT denies any adverse reactions to current medications since the last visit.  C/o loneliness and is interested in going to seniors center, has transportation difficulties and will be goofd to get grandson to help her get this sorted out ROS Denies recent fever or chills. Denies sinus pressure, nasal congestion, ear pain or sore throat. Denies chest congestion, productive cough or wheezing. Denies chest pains, palpitations and leg swelling Denies abdominal pain, nausea, vomiting,diarrhea or constipation.   Denies dysuria, frequency, hesitancy or incontinence. Chronic  joint pain, swelling and limitation in mobility. . Denies skin break down or rash.   PE  BP 118/66 (BP Location: Right Arm, Patient Position: Sitting, Cuff Size: Normal)   Pulse 75   Ht 5\' 1"  (1.549 m)   Wt 140 lb 1.3 oz (63.5 kg)   SpO2 96%   BMI 26.47 kg/m   Patient alert and oriented and in no cardiopulmonary distress.  HEENT: No facial asymmetry, EOMI,     Neck supple .  Chest: Clear to auscultation bilaterally.  CVS: S1, S2 no murmurs, no S3.Regular rate.  ABD: Soft non tender.   Ext: No edema  MS: decreased e ROM spine, shoulders, hips and knees.  Skin: Intact, no ulcerations or rash noted.  Psych: Good eye contact, normal affect. Memory intact not anxious mildly  depressed appearing.  CNS: CN 2-12 intact, power,  normal throughout.no focal deficits noted.   Assessment & Plan  Essential hypertension Controlled, no change in medication   Parent refuses immunizations Offered vaccines and re educated,  continues to refuse  Hypothyroidism Updated lab needed at/ before next visit.   Hyperlipidemia LDL goal <100 Hyperlipidemia:Low fat diet discussed and encouraged.   Lipid Panel  Lab Results  Component Value Date   CHOL 170 11/14/2022   HDL 62 11/14/2022   LDLCALC 91 11/14/2022   TRIG 92 11/14/2022   CHOLHDL 2.7 11/14/2022     Updated lab needed at/ before next visit.   CKD (chronic kidney disease) stage 4, GFR 15-29 ml/min (HCC) Updated lab needed at/ before next visit.   Encounter for chronic pain management The patient's Controlled Substance registry is reviewed and compliance confirmed. Adequacy of  Pain control and level of function is assessed. Medication dosing is adjusted as deemed appropriate. Twelve weeks of medication is prescribed , with a follow up appointment between 11 to 12 weeks .   Loneliness Reports feeling isolated, has limitation oin transport , has buried 3 of her 4 children and her sppouse, interested in getting out to senior center will reach out to grandson for help

## 2023-06-04 NOTE — Assessment & Plan Note (Signed)
Offered vaccines and re educated, continues to refuse

## 2023-06-04 NOTE — Assessment & Plan Note (Signed)
The patient's Controlled Substance registry is reviewed and compliance confirmed. Adequacy of  Pain control and level of function is assessed. Medication dosing is adjusted as deemed appropriate. Twelve weeks of medication is prescribed , with a follow up appointment between 11 to 12 weeks .  

## 2023-06-11 ENCOUNTER — Telehealth: Payer: Self-pay | Admitting: Family Medicine

## 2023-06-11 NOTE — Telephone Encounter (Signed)
Prescription Request  06/11/2023  LOV: 05/22/2023  What is the name of the medication or equipment? pregabalin (LYRICA) 25 MG capsule [[id number]]  Patient is OUT of med   Have you contacted your pharmacy to request a refill? No   Which pharmacy would you like this sent to?   [facility] - [place], [place] - [address] [place] [address] Phone: [phone] Fax: [phone]    Patient notified that their request is being sent to the clinical staff for review and that they should receive a response within 2 business days.   Please advise at [facility] [phone]

## 2023-06-12 MED ORDER — PREGABALIN 25 MG PO CAPS
25.0000 mg | ORAL_CAPSULE | Freq: Every day | ORAL | 1 refills | Status: DC
Start: 2023-06-12 — End: 2023-08-13

## 2023-06-12 NOTE — Telephone Encounter (Signed)
Requests refill of lyrica- she is out

## 2023-06-25 ENCOUNTER — Other Ambulatory Visit: Payer: Self-pay | Admitting: Family Medicine

## 2023-07-26 ENCOUNTER — Telehealth: Payer: Self-pay

## 2023-07-26 MED ORDER — PREGABALIN 25 MG PO CAPS: 25.0000 mg | ORAL_CAPSULE | Freq: Two times a day (BID) | ORAL | 0 refills | Status: DC

## 2023-07-26 NOTE — Telephone Encounter (Signed)
Copied from CRM [id number]. Topic: Clinical - Medication Question >> Jul 26, 2023  2:08 PM Alcus Dad H wrote: Reason for CRM: Pt wants Dr. Lodema Hong or the nurse to give her a call regarding the pregabalin, she wants to know if she is able to take medication twice a day instead of once of day for the pain. If she doesn't answer, please leave her a vm and let her know if that is okay because she said she was going to lay down for a bit     Please advise ?

## 2023-07-26 NOTE — Telephone Encounter (Signed)
I spoke with pt needs appt re nerve pain and lyrica dose in 24 to 27 days pls schedule, I did inc the dose( had typ in first msg sent)

## 2023-07-26 NOTE — Telephone Encounter (Signed)
I spoke wioththe pt, effective 12/19 lyrica dose is ioncreased to twice daily,n needs an appt in 21 to 247days pls schedules

## 2023-07-26 NOTE — Addendum Note (Signed)
Addended by: Kerri Perches on: 07/26/2023 05:21 PM   Modules accepted: Orders

## 2023-07-27 NOTE — Telephone Encounter (Signed)
Appointment scheduled.

## 2023-08-07 ENCOUNTER — Ambulatory Visit: Payer: Self-pay | Admitting: Family Medicine

## 2023-08-07 NOTE — Telephone Encounter (Signed)
 Copied from CRM [id number]. Topic: Clinical - Red Word Triage >> Aug 07, 2023  4:11 PM Bascom RAMAN wrote: Red Word that prompted transfer to Nurse Triage: Burning in legs and feet for several days. Medication is not working   Stage Manager Complaint: Bilateral leg pain Symptoms: Pain--burning sensation Frequency: Since December 26th Pertinent Negatives: Patient denies any rashes, long car trips, swelling of either leg, numbness, weakness, chest pain, back pain, difficulty breathing, fever, or any injuries Disposition: [x] ED /[x] Urgent Care (no appt availability in office) / [] Appointment(In office/virtual)/ []  [facility]/ [] Home Care/ [x] Refused Recommended Disposition /[] [facility]/ []  Follow-up with PCP Additional Notes: Patient called and advised that her legs are burning.  She has been told that this was due to nerve pain. She states that she spoke with her PCP about a week ago and she told the patient to up the dose of medication to twice a day but it is not helping at all.  Patient is advised that there are no appointments available in the office in the near future and for her to go to an urgent care.  She is also advised that if the pain gets worse to go to the emergency room.  Patient denies any rashes, long car trips, swelling of either leg, numbness, weakness, chest pain, back pain, difficulty breathing, fever, or any injuries. Patient states she does not want to go to the emergency room for anything.  She is advised that urgent cares are also an option until she can hear back from her PCP.  She is hoping that her PCP can send her a message or adjust her medication like she did last time to help her with the burning feeling.  Patient states that she would like to hear back from her PCP and see about being worked in or if her PCP can just adjust her medication.  Patient is again advised that if anything gets worse to go be seen immediately for further evaluation.  Patient verbalized  understanding.  Reason for Disposition  [1] MODERATE pain (e.g., interferes with normal activities, limping) AND [2] present > 3 days  Answer Assessment - Initial Assessment Questions 1. ONSET: When did the pain start?      December 26th 2. LOCATION: Where is the pain located?      Both legs--feels like burning 3. PAIN: How bad is the pain?    (Scale 1-10; or mild, moderate, severe)   -  MILD (1-3): doesn't interfere with normal activities    -  MODERATE (4-7): interferes with normal activities (e.g., work or school) or awakens from sleep, limping    -  SEVERE (8-10): excruciating pain, unable to do any normal activities, unable to walk     10 4. WORK OR EXERCISE: Has there been any recent work or exercise that involved this part of the body?      No 5. CAUSE: What do you think is causing the leg pain?     Patient has been told nerves by her PCP 6. OTHER SYMPTOMS: Do you have any other symptoms? (e.g., chest pain, back pain, breathing difficulty, swelling, rash, fever, numbness, weakness)     No  Protocols used: Leg Pain-A-AH

## 2023-08-09 ENCOUNTER — Encounter: Payer: Self-pay | Admitting: Family Medicine

## 2023-08-09 ENCOUNTER — Ambulatory Visit (INDEPENDENT_AMBULATORY_CARE_PROVIDER_SITE_OTHER): Payer: Medicare HMO | Admitting: Family Medicine

## 2023-08-09 VITALS — BP 130/72 | HR 69 | Ht 61.0 in | Wt 143.0 lb

## 2023-08-09 DIAGNOSIS — M2550 Pain in unspecified joint: Secondary | ICD-10-CM | POA: Diagnosis not present

## 2023-08-09 DIAGNOSIS — E038 Other specified hypothyroidism: Secondary | ICD-10-CM | POA: Diagnosis not present

## 2023-08-09 DIAGNOSIS — I10 Essential (primary) hypertension: Secondary | ICD-10-CM

## 2023-08-09 DIAGNOSIS — M4726 Other spondylosis with radiculopathy, lumbar region: Secondary | ICD-10-CM | POA: Diagnosis not present

## 2023-08-09 DIAGNOSIS — E785 Hyperlipidemia, unspecified: Secondary | ICD-10-CM | POA: Diagnosis not present

## 2023-08-09 DIAGNOSIS — I83813 Varicose veins of bilateral lower extremities with pain: Secondary | ICD-10-CM | POA: Diagnosis not present

## 2023-08-09 MED ORDER — PREGABALIN 25 MG PO CAPS: 25.0000 mg | ORAL_CAPSULE | Freq: Three times a day (TID) | ORAL | 1 refills | Status: DC

## 2023-08-09 NOTE — Patient Instructions (Addendum)
 F/U as before, call if you need me sooner  Labs ordered 10/15 today  Dose increase in lyrica and appt with  vein specialists as soon as possible

## 2023-08-09 NOTE — Telephone Encounter (Signed)
 Appt made for 08/09/23

## 2023-08-09 NOTE — Progress Notes (Signed)
   Katelyn Rowland     MRN: [id number]      DOB: 1940/09/16  Chief Complaint  Patient presents with   Leg Pain    Leg pain     HPI Katelyn Rowland is here c/o bilateral leg painburning from both knees to feet, left worse than right  they were red , worst on 08/02/2023 She was recently started on an increased dose of lyrica , which unfortunately did not help with her pain  She has  bilateral  varicose veins which remain distended , left more than right Denies SOB, hemoptysis or leg swelling  ROS Denies recent fever or chills. Denies sinus pressure, nasal congestion, ear pain or sore throat. Denies chest congestion, productive cough or wheezing. Denies chest pains, palpitations , POND and orthopnea Denies abdominal pain, nausea, vomiting,diarrhea or constipation.   Denies dysuria, frequency, hesitancy or incontinence. Chronic  joint pain, swelling and limitation in mobility. Chronic  numbness, and  tingling. Denies uncontrolled depression, anxiety or insomnia. Denies skin break down or rash.   PE BP 130/72   Pulse 69   Ht 5' 1 (1.549 m)   Wt 143 lb 0.6 oz (64.9 kg)   SpO2 95%   BMI 27.03 kg/m    Patient alert and oriented and in no cardiopulmonary distress.Pt in pain  HEENT: No facial asymmetry, EOMI,     Neck decreased ROM.  Chest: Clear to auscultation bilaterally.  CVS: S1, S2 no murmurs, no S3. Distended veins in both lower extremities and left groin,  ABD: Soft non tender.   Ext: No edema, mild erythema  MS: Decreased  ROM spine, shoulders, hips and knees.  Skin: Intact, no ulcerations or rash noted.  Psych: Good eye contact, normal affect. Memory intact not anxious or depressed appearing.  CNS: CN 2-12 intact, power,  normal throughout.no focal deficits noted.   Assessment & Plan  Varicose veins of both lower extremities with pain 10 day h/o increased pain and swelling refer vascular for re evaluation. Marked dilatation and engorgement of veins of LLE esp in  groin, which is not new Increase dose of lyrica  to 3 times daily  Other spondylosis with radiculopathy, lumbar region 28 day supply due 01/13, script sent  Generalized joint pain One month medication sent to start 1/13/29025  Essential hypertension Controlled, no change in medication

## 2023-08-10 LAB — CMP14+EGFR
ALT: 7 [IU]/L (ref 0–32)
AST: 18 [IU]/L (ref 0–40)
Albumin: 4.3 g/dL (ref 3.7–4.7)
Alkaline Phosphatase: 83 [IU]/L (ref 44–121)
BUN/Creatinine Ratio: 13 (ref 12–28)
BUN: 29 mg/dL — ABNORMAL HIGH (ref 8–27)
Bilirubin Total: 0.3 mg/dL (ref 0.0–1.2)
CO2: 23 mmol/L (ref 20–29)
Calcium: 9.4 mg/dL (ref 8.7–10.3)
Chloride: 104 mmol/L (ref 96–106)
Creatinine, Ser: 2.18 mg/dL — ABNORMAL HIGH (ref 0.57–1.00)
Globulin, Total: 2.4 g/dL (ref 1.5–4.5)
Glucose: 80 mg/dL (ref 70–99)
Potassium: 5.4 mmol/L — ABNORMAL HIGH (ref 3.5–5.2)
Sodium: 145 mmol/L — ABNORMAL HIGH (ref 134–144)
Total Protein: 6.7 g/dL (ref 6.0–8.5)
eGFR: 22 mL/min/{1.73_m2} — ABNORMAL LOW (ref >59)

## 2023-08-10 LAB — LIPID PANEL
Chol/HDL Ratio: 2.9 {ratio} (ref 0.0–4.4)
Cholesterol, Total: 210 mg/dL — ABNORMAL HIGH (ref 100–199)
HDL: 72 mg/dL (ref >39)
LDL Chol Calc (NIH): 119 mg/dL — ABNORMAL HIGH (ref 0–99)
Triglycerides: 108 mg/dL (ref 0–149)
VLDL Cholesterol Cal: 19 mg/dL (ref 5–40)

## 2023-08-10 LAB — TSH: TSH: 1.08 u[IU]/mL (ref 0.450–4.500)

## 2023-08-13 DIAGNOSIS — I83813 Varicose veins of bilateral lower extremities with pain: Secondary | ICD-10-CM | POA: Insufficient documentation

## 2023-08-13 MED ORDER — METHADONE HCL 10 MG PO TABS: ORAL_TABLET | ORAL | 0 refills | Status: AC

## 2023-08-13 NOTE — Assessment & Plan Note (Signed)
 28 day supply due 01/13, script sent

## 2023-08-13 NOTE — Assessment & Plan Note (Signed)
 Controlled, no change in medication

## 2023-08-13 NOTE — Assessment & Plan Note (Signed)
 One month medication sent to start 1/13/29025

## 2023-08-13 NOTE — Assessment & Plan Note (Addendum)
 10 day h/o increased pain and swelling refer vascular for re evaluation. Marked dilatation and engorgement of veins of LLE esp in groin, which is not new Increase dose of lyrica to 3 times daily

## 2023-08-14 ENCOUNTER — Ambulatory Visit: Payer: Medicare HMO | Admitting: Family Medicine

## 2023-08-21 ENCOUNTER — Ambulatory Visit: Payer: Medicare HMO | Admitting: Family Medicine

## 2023-08-22 ENCOUNTER — Other Ambulatory Visit: Payer: Self-pay | Admitting: Family Medicine

## 2023-08-22 DIAGNOSIS — G8929 Other chronic pain: Secondary | ICD-10-CM

## 2023-08-23 ENCOUNTER — Telehealth: Payer: Self-pay

## 2023-08-23 DIAGNOSIS — M25552 Pain in left hip: Secondary | ICD-10-CM

## 2023-08-23 NOTE — Telephone Encounter (Signed)
Copied from CRM [id number]. Topic: Clinical - Medical Advice >> Aug 22, 2023  2:07 PM Mosetta Putt H wrote: Reason for CRM: Something happen to left hip and can't walk

## 2023-08-23 NOTE — Addendum Note (Signed)
Addended by: Kerri Perches on: 08/23/2023 02:35 PM   Modules accepted: Orders

## 2023-08-28 NOTE — Telephone Encounter (Signed)
Patient aware.

## 2023-08-29 NOTE — Telephone Encounter (Unsigned)
Copied from CRM [id number]. Topic: General - Other >> Aug 28, 2023  2:59 PM Donita Brooks wrote: Reason for CRM: pt would like for Brandy to give her a call. Pt would like to speak with Brandy before geting x rays

## 2023-09-04 NOTE — Telephone Encounter (Signed)
Copied from CRM [id number]. Topic: Clinical - Medical Advice >> Sep 04, 2023 10:36 AM Ivette P wrote: Reason for CRM: Pt is needed to get xrays and pt states she needs an ambulance to take pt. Pt is requesting callback [phone]

## 2023-09-12 ENCOUNTER — Other Ambulatory Visit: Payer: Self-pay | Admitting: Family Medicine

## 2023-09-13 NOTE — Telephone Encounter (Unsigned)
 Copied from CRM [id number]. Topic: Clinical - Request for Lab/Test Order >> Sep 13, 2023 12:10 PM Katelyn Rowland wrote: Reason for CRM: Patient states the order is for the wrong hip. It is her right hip that is giving her issues. Patient has an appointment scheduled tomorrow, and the order needs to be corrected. If not done tomorrow, next appointment will be in March. Callback number [phone]

## 2023-09-14 ENCOUNTER — Telehealth: Payer: Self-pay | Admitting: Family Medicine

## 2023-09-14 ENCOUNTER — Ambulatory Visit (HOSPITAL_COMMUNITY)
Admission: RE | Admit: 2023-09-14 | Discharge: 2023-09-14 | Disposition: A | Discharge status: Home / Self Care | Payer: Medicare HMO | Source: Ambulatory Visit | Attending: Family Medicine | Admitting: Family Medicine

## 2023-09-14 DIAGNOSIS — M25551 Pain in right hip: Secondary | ICD-10-CM

## 2023-09-14 DIAGNOSIS — M160 Bilateral primary osteoarthritis of hip: Secondary | ICD-10-CM | POA: Diagnosis not present

## 2023-09-14 DIAGNOSIS — M8580 Other specified disorders of bone density and structure, unspecified site: Secondary | ICD-10-CM | POA: Diagnosis not present

## 2023-09-14 DIAGNOSIS — I10 Essential (primary) hypertension: Secondary | ICD-10-CM | POA: Diagnosis not present

## 2023-09-14 DIAGNOSIS — I959 Hypotension, unspecified: Secondary | ICD-10-CM | POA: Diagnosis not present

## 2023-09-14 NOTE — Telephone Encounter (Signed)
 Patient called heading to hospital today at 1:000 pm by ambulance for her xray of her hip.  Wrong xray was ordered, should have been her right hip patient can not walk.

## 2023-09-14 NOTE — Telephone Encounter (Signed)
 Updated order to right side as requested

## 2023-09-18 MED ORDER — PREGABALIN 25 MG PO CAPS: 25.0000 mg | ORAL_CAPSULE | Freq: Three times a day (TID) | ORAL | 0 refills | Status: DC

## 2023-09-18 MED ORDER — METHADONE HCL 10 MG PO TABS: 10.0000 mg | ORAL_TABLET | Freq: Three times a day (TID) | ORAL | 0 refills | Status: DC

## 2023-09-18 NOTE — Addendum Note (Signed)
Addended by: Kerri Perches on: 09/18/2023 05:26 PM   Modules accepted: Orders

## 2023-09-20 ENCOUNTER — Other Ambulatory Visit: Payer: Self-pay | Admitting: Family Medicine

## 2023-09-20 DIAGNOSIS — N184 Chronic kidney disease, stage 4 (severe): Secondary | ICD-10-CM

## 2023-09-27 ENCOUNTER — Other Ambulatory Visit: Payer: Self-pay | Admitting: Family Medicine

## 2023-09-28 ENCOUNTER — Ambulatory Visit: Payer: Self-pay | Admitting: Family Medicine

## 2023-09-28 NOTE — Telephone Encounter (Addendum)
 Copied from CRM [id number]. Topic: Clinical - Red Word Triage >> Sep 28, 2023  3:04 PM Alvino Blood C wrote: Red Word that prompted transfer to Nurse Triage: Patient states she has been having problems with walking since 09/15/2023 she believes it maybe due to her arthritis. Patient states she also has a burning sensation in her legs Reason for Disposition  [1] MODERATE pain (e.g., interferes with normal activities, limping) AND [2] present > 3 days  Answer Assessment - Initial Assessment Questions 1. ONSET: "When did the pain start?"      C/o leg burning   She is c/o right hip.    She received a call that the x ray shows I have arthritis in both hips.   I've not been able to walk.   I was told to make a appt with Dr. Lodema Hong.   Can I do a phone visit for now?    2. LOCATION: "Where is the pain located?"      My hip    I'm using my motor chair.   3. PAIN: "How bad is the pain?"    (Scale 1-10; or mild, moderate, severe)   -  MILD (1-3): doesn't interfere with normal activities    -  MODERATE (4-7): interferes with normal activities (e.g., work or school) or awakens from sleep, limping    -  SEVERE (8-10): excruciating pain, unable to do any normal activities, unable to walk     Severe pain in right hip 4. WORK OR EXERCISE: "Has there been any recent work or exercise that involved this part of the body?"      This is in response to an x ray that was done to make an appt with Dr. Lodema Hong. 5. CAUSE: "What do you think is causing the leg pain?"     Arthritis per the x ray 6. OTHER SYMPTOMS: "Do you have any other symptoms?" (e.g., chest pain, back pain, breathing difficulty, swelling, rash, fever, numbness, weakness)     My legs burn and my hip hurts 7. PREGNANCY: "Is there any chance you are pregnant?" "When was your last menstrual period?"     N/A  Protocols used: Leg Pain-A-AH  Chief Complaint: Pt got  message to call and make an appt with Dr. Lodema Hong after receiving hip x ray results of arthritis,   She is in a motorized chair and can't walk.  Pt requesting a phone visit with Dr. Lodema Hong because she can't get into the office.   "She has done phone visits with me before".     Symptoms: Pain in right hip and burning in both legs. Frequency: daily Pertinent Negatives: Patient denies being able to walk right now. Disposition: [] ED /[] Urgent Care (no appt availability in office) / [x] Appointment(In office/virtual)/ []  [facility]/ [] Home Care/ [] Refused Recommended Disposition /[] [facility]/ [x]  Follow-up with PCP Additional Notes: I called into [facility] and let them know she was requesting a "phone visit" with Dr. Lodema Hong.   The practice asked that I send a message over and they would check with Dr. Lodema Hong and her nurse and call the pt. Back.     I had a system issue where I could not put the pt on hold and call the practice so I ended the call with the pt and called the practice.   I then called pt back and let her know the office would be calling her back directly regarding setting up a phone appt. With Dr. Lodema Hong.  She thanked me very much for my help and was agreeable to this plan. I sent my notes over to [facility] as instructed by the practice.

## 2023-10-02 ENCOUNTER — Telehealth (INDEPENDENT_AMBULATORY_CARE_PROVIDER_SITE_OTHER): Payer: Medicare HMO | Admitting: Family Medicine

## 2023-10-02 ENCOUNTER — Encounter: Payer: Self-pay | Admitting: Family Medicine

## 2023-10-02 VITALS — Ht 61.0 in | Wt 143.0 lb

## 2023-10-02 DIAGNOSIS — M1711 Unilateral primary osteoarthritis, right knee: Secondary | ICD-10-CM

## 2023-10-02 DIAGNOSIS — M160 Bilateral primary osteoarthritis of hip: Secondary | ICD-10-CM | POA: Diagnosis not present

## 2023-10-02 DIAGNOSIS — M4726 Other spondylosis with radiculopathy, lumbar region: Secondary | ICD-10-CM

## 2023-10-02 DIAGNOSIS — R262 Difficulty in walking, not elsewhere classified: Secondary | ICD-10-CM

## 2023-10-02 NOTE — Assessment & Plan Note (Signed)
 New onset inability to walk, established sevvere osteo in kneee , ortho eval

## 2023-10-02 NOTE — Progress Notes (Unsigned)
 Virtual Visit via Telephone Note  I connected with Katelyn Rowland on 10/02/23 at  2:00 PM EST by telephone and verified that I am speaking with the correct person using two identifiers.  Location: Patient: home Provider: office   I discussed the limitations, risks, security and privacy concerns of performing an evaluation and management service by telephone and the availability of in person appointments. I also discussed with the patient that there may be a patient responsible charge related to this service. The patient expressed understanding and agreed to proceed.   History of Present Illness: C/o increased back and hip pain which started on Jan 8, wasin her usual stae pof health when acutely became unable to walk, no known trauma, X ray hip shows severe arthritis, still reporting inability to weight berear and walk safely, needs Ortho eval and agrees to go, was rewuesting 3 week appt when she can " walk into the office" I reco,mmend and she agrees with asap appt   Observations/Objective:  Ht 5\' 1"  (1.549 m)   Wt 143 lb (64.9 kg)   BMI 27.02 kg/m  Good communication with no confusion and intact memory. Alert and oriented x 3 No signs of respiratory distress during speech  Assessment and Plan:  Inability to walk Acute onset 01/08, no known trauma, has severe arthritis of spine , hips and knees, Ortho to eval and manage, home safety and fall risk reduction discussed  Osteoarthritis of right knee New onset inability to walk, established sevvere osteo in kneee , ortho eval  Other spondylosis with radiculopathy, lumbar region Severe chronic pain and debility  Osteoarthritis of both hips No known trauma, unable towalk x 5 weeks, Ortho eval asap  Follow Up Instructions:    I discussed the assessment and treatment plan with the patient. The patient was provided an opportunity to ask questions and all were answered. The patient agreed with the plan and demonstrated an understanding  of the instructions.   The patient was advised to call back or seek an in-person evaluation if the symptoms worsen or if the condition fails to improve as anticipated.  I provided 12 minutes of non-face-to-face time during this encounter.   Syliva Overman, MD

## 2023-10-02 NOTE — Assessment & Plan Note (Addendum)
 Acute onset 01/08, no known trauma, has severe arthritis of spine , hips and knees, Ortho to eval and manage, home safety and fall risk reduction discussed

## 2023-10-02 NOTE — Patient Instructions (Addendum)
 You  are referred to Dr Romeo Apple re inability to walk , for further evaluation and management, his foffice will call you with your appointment information  F/U appointment in office in 4 to 6 weeks, needs to be scheduled

## 2023-10-02 NOTE — Assessment & Plan Note (Signed)
 Severe chronic pain and debility

## 2023-10-02 NOTE — Telephone Encounter (Signed)
 scheduled

## 2023-10-02 NOTE — Assessment & Plan Note (Signed)
 No known trauma, unable towalk x 5 weeks, Ortho eval asap

## 2023-10-15 ENCOUNTER — Other Ambulatory Visit: Payer: Self-pay | Admitting: Family Medicine

## 2023-10-26 ENCOUNTER — Other Ambulatory Visit: Payer: Self-pay | Admitting: Family Medicine

## 2023-10-26 NOTE — Telephone Encounter (Signed)
 Copied from CRM [id number]. Topic: Clinical - Medication Refill >> Oct 26, 2023 10:24 AM Higinio Roger wrote: Most Recent Primary Care Visit:  Provider: Kerri Perches  Department: [facility]  Visit Type: OFFICE VISIT  Date: 08/09/2023  Medication: methadone (DOLOPHINE) 10 MG tablet  Has the patient contacted their pharmacy? Yes (Agent: If no, request that the patient contact the pharmacy for the refill. If patient does not wish to contact the pharmacy document the reason why and proceed with request.) (Agent: If yes, when and what did the pharmacy advise?) Faxed and haven't heard back  Is this the correct pharmacy for this prescription? Yes If no, delete pharmacy and type the correct one.  This is the patient's preferred pharmacy:   [facility] - [place], [place] - [address] [place] [address] Phone: [phone] Fax: [phone]  Has the prescription been filled recently? Yes  Is the patient out of the medication? Yes  Has the patient been seen for an appointment in the last year OR does the patient have an upcoming appointment? Yes  Can we respond through MyChart? No. Call [phone]  Agent: Please be advised that Rx refills may take up to 3 business days. We ask that you follow-up with your pharmacy.

## 2023-10-30 MED ORDER — METHADONE HCL 10 MG PO TABS: 10.0000 mg | ORAL_TABLET | Freq: Three times a day (TID) | ORAL | 0 refills | Status: DC

## 2023-11-07 ENCOUNTER — Ambulatory Visit (INDEPENDENT_AMBULATORY_CARE_PROVIDER_SITE_OTHER): Payer: Medicare HMO | Admitting: Family Medicine

## 2023-11-07 VITALS — BP 169/80 | HR 79 | Resp 18 | Ht 62.0 in | Wt 152.1 lb

## 2023-11-07 DIAGNOSIS — R4589 Other symptoms and signs involving emotional state: Secondary | ICD-10-CM

## 2023-11-07 DIAGNOSIS — M2550 Pain in unspecified joint: Secondary | ICD-10-CM

## 2023-11-07 DIAGNOSIS — E785 Hyperlipidemia, unspecified: Secondary | ICD-10-CM

## 2023-11-07 DIAGNOSIS — Z2882 Immunization not carried out because of caregiver refusal: Secondary | ICD-10-CM | POA: Diagnosis not present

## 2023-11-07 DIAGNOSIS — N184 Chronic kidney disease, stage 4 (severe): Secondary | ICD-10-CM

## 2023-11-07 DIAGNOSIS — M25561 Pain in right knee: Secondary | ICD-10-CM | POA: Diagnosis not present

## 2023-11-07 DIAGNOSIS — G8929 Other chronic pain: Secondary | ICD-10-CM

## 2023-11-07 DIAGNOSIS — E038 Other specified hypothyroidism: Secondary | ICD-10-CM | POA: Diagnosis not present

## 2023-11-07 DIAGNOSIS — I10 Essential (primary) hypertension: Secondary | ICD-10-CM

## 2023-11-07 MED ORDER — NIFEDIPINE ER OSMOTIC RELEASE 60 MG PO TB24
60.0000 mg | ORAL_TABLET | Freq: Every day | ORAL | 4 refills | Status: DC
Start: 2023-11-07 — End: 2024-05-21

## 2023-11-07 NOTE — Patient Instructions (Addendum)
 Annual exam 2nd week in July, and pain mangement contract at visit, call if you need me sooner   Nurse BP check in 6 weeks  New higher dose nifedipine 60 mg daily, blood pressure is consistently high  Pain medication will be sent to be filled every 4 weeks, 28 days on a MondAY, next due April 21  You are referred to Orthopedics Dr Romeo Apple re right knee  Thankful you are doing better  Please [facility] to get to [facility] as we discussed  Thanks for choosing [facility], we consider it a privelige to serve you.

## 2023-11-07 NOTE — Assessment & Plan Note (Addendum)
 Ortho eval reports increased pain , stiffness and instability wants eval

## 2023-11-11 ENCOUNTER — Encounter: Payer: Self-pay | Admitting: Family Medicine

## 2023-11-11 MED ORDER — METHADONE HCL 10 MG PO TABS: 10.0000 mg | ORAL_TABLET | Freq: Three times a day (TID) | ORAL | 0 refills | Status: AC

## 2023-11-11 NOTE — Assessment & Plan Note (Signed)
 Unchanged

## 2023-11-11 NOTE — Progress Notes (Signed)
 Katelyn Rowland     MRN: [id number]      DOB: April 25, 1941  Chief Complaint  Patient presents with   Hypertension    Follow up on hypertension     HPI Katelyn Rowland is here for follow up and re-evaluation of chronic medical conditions, medication management and review of any available recent lab and radiology data.  Preventive health is updated, specifically  Cancer screening and Immunization.  Wants neither C/o increased right knee pain , stiffness and instability ands requests Ortho eval Concerned about pain medication as she was out for reportedly 2 weeks in January when she missed appointment because of inability to walk. I ask Grandson with her that should this recurr, please arrange video visit, and was sorry she was inconvenienced Discussed her loneliness that she reported at last visit sand attempts will be made to get [facility] to the [facility], hopefully soon, she will do well there Lyrica non beneficial does not want to continue this   ROS Denies recent fever or chills. Denies sinus pressure, nasal congestion, ear pain or sore throat. Denies chest congestion, productive cough or wheezing. Denies chest pains, palpitations and leg swelling Denies abdominal pain, nausea, vomiting,diarrhea or constipation.   Denies dysuria, frequency, hesitancy or incontinence. Increased and uncontrolled  joint pain, and limitation in mobility. Denies headaches, seizures, numbness, or tingling. Denies depression, anxiety or insomnia. Denies skin break down or rash.   PE  BP (!) 169/80   Pulse 79   Resp 18   Ht 5\' 2"  (1.575 m)   Wt 152 lb 1.3 oz (69 kg)   SpO2 93%   BMI 27.82 kg/m   Patient alert and oriented and in no cardiopulmonary distress.  HEENT: No facial asymmetry, EOMI,     Neck decreased e .  Chest: Clear to auscultation bilaterally.  CVS: S1, S2 no murmurs, no S3.Regular rate.  ABD: Soft non tender.   Ext: No edema  MS: decreased ROM spine, shoulders, hips and  knees.  Skin: Intact, no ulcerations or rash noted.  Psych: Good eye contact, normal affect. Memory intact not anxious or depressed appearing.  CNS: CN 2-12 intact, power,  normal throughout.no focal deficits noted.   Assessment & Plan  Right knee pain Ortho eval reports increased pain , stiffness and instability wants eval   Parent refuses immunizations unchanged  Essential hypertension DASH diet and commitment to daily physical activity for a minimum of 30 minutes discussed and encouraged, as a part of hypertension management. The importance of attaining a healthy weight is also discussed.     11/07/2023    3:35 PM 10/02/2023    2:23 PM 08/09/2023    4:02 PM 08/09/2023    2:36 PM 08/09/2023    2:34 PM 05/22/2023    2:53 PM 03/26/2023    2:45 PM  BP/Weight  Systolic BP 169  355 145 148 118 --  Diastolic BP 80  72 76 73 66 --  Wt. (Lbs) 152.08 143   143.04 140.08 136  BMI 27.82 kg/m2 27.02 kg/m2   27.03 kg/m2 26.47 kg/m2 26.56 kg/m2     Increase nifedipine dose and nurse BP chanck in 4 to 6 weeks  CKD (chronic kidney disease) stage 4, GFR 15-29 ml/min (HCC) Unchanged   Hypothyroidism Controlled, no change in medication   Generalized joint pain Reports increased knee pain otherwise unchanged, chronic pain management as before  Hyperlipidemia LDL goal <100 Hyperlipidemia:Low fat diet discussed and encouraged.   Lipid Panel  Lab Results  Component Value Date   CHOL 210 (H) 08/09/2023   HDL 72 08/09/2023   LDLCALC 119 (H) 08/09/2023   TRIG 108 08/09/2023   CHOLHDL 2.9 08/09/2023     Needs to lower fat intake  Loneliness Grandson present and I ask htat he see if he can get her set up at the Senior [facility]  Encounter for chronic pain management The patient's Controlled Substance registry is reviewed and compliance confirmed. Adequacy of  Pain control and level of function is assessed. Medication dosing is adjusted as deemed appropriate. Twelve weeks of  medication is prescribed , with a follow up appointment between 11 to 12 weeks .

## 2023-11-11 NOTE — Assessment & Plan Note (Signed)
 DASH diet and commitment to daily physical activity for a minimum of 30 minutes discussed and encouraged, as a part of hypertension management. The importance of attaining a healthy weight is also discussed.     11/07/2023    3:35 PM 10/02/2023    2:23 PM 08/09/2023    4:02 PM 08/09/2023    2:36 PM 08/09/2023    2:34 PM 05/22/2023    2:53 PM 03/26/2023    2:45 PM  BP/Weight  Systolic BP 169  161 145 148 118 --  Diastolic BP 80  72 76 73 66 --  Wt. (Lbs) 152.08 143   143.04 140.08 136  BMI 27.82 kg/m2 27.02 kg/m2   27.03 kg/m2 26.47 kg/m2 26.56 kg/m2     Increase nifedipine dose and nurse BP chanck in 4 to 6 weeks

## 2023-11-11 NOTE — Assessment & Plan Note (Signed)
 Hyperlipidemia:Low fat diet discussed and encouraged.   Lipid Panel  Lab Results  Component Value Date   CHOL 210 (H) 08/09/2023   HDL 72 08/09/2023   LDLCALC 119 (H) 08/09/2023   TRIG 108 08/09/2023   CHOLHDL 2.9 08/09/2023     Needs to lower fat intake

## 2023-11-11 NOTE — Assessment & Plan Note (Signed)
 The patient's Controlled Substance registry is reviewed and compliance confirmed. Adequacy of  Pain control and level of function is assessed. Medication dosing is adjusted as deemed appropriate. Twelve weeks of medication is prescribed , with a follow up appointment between 11 to 12 weeks .

## 2023-11-11 NOTE — Assessment & Plan Note (Signed)
 Reports increased knee pain otherwise unchanged, chronic pain management as before

## 2023-11-11 NOTE — Assessment & Plan Note (Signed)
 Controlled, no change in medication

## 2023-11-11 NOTE — Assessment & Plan Note (Signed)
 unchanged

## 2023-11-11 NOTE — Assessment & Plan Note (Signed)
 Grandson present and I ask htat he see if he can get her set up at the Senior [facility]

## 2023-12-21 ENCOUNTER — Ambulatory Visit

## 2023-12-21 NOTE — Progress Notes (Signed)
 Patient is in office today for a nurse visit for Blood Pressure Check. Patient blood pressure was 156/73, Patient No chest pain. Other vitals obtained are recorded in this encounter

## 2024-01-04 ENCOUNTER — Other Ambulatory Visit: Payer: Self-pay | Admitting: Family Medicine

## 2024-01-04 DIAGNOSIS — N184 Chronic kidney disease, stage 4 (severe): Secondary | ICD-10-CM

## 2024-01-28 ENCOUNTER — Other Ambulatory Visit: Payer: Self-pay

## 2024-01-28 ENCOUNTER — Ambulatory Visit (INDEPENDENT_AMBULATORY_CARE_PROVIDER_SITE_OTHER): Admitting: Orthopedic Surgery

## 2024-01-28 ENCOUNTER — Other Ambulatory Visit (INDEPENDENT_AMBULATORY_CARE_PROVIDER_SITE_OTHER): Payer: Self-pay

## 2024-01-28 VITALS — BP 137/68 | HR 99 | Wt 155.0 lb

## 2024-01-28 DIAGNOSIS — M170 Bilateral primary osteoarthritis of knee: Secondary | ICD-10-CM

## 2024-01-28 DIAGNOSIS — G8929 Other chronic pain: Secondary | ICD-10-CM

## 2024-01-28 MED ORDER — PREDNISONE 10 MG PO TABS: 10.0000 mg | ORAL_TABLET | Freq: Two times a day (BID) | ORAL | 5 refills | Status: DC

## 2024-01-28 NOTE — Progress Notes (Signed)
 Chief complaint bilateral knee pain  History this is an 83 year old female last seen for her knees in 2013 and seen for her wrist in 2016 presents with bilateral knee pain primarily in the medial side of the joint with somewhat deteriorating function requiring the use of a cane  Review of systems no evidence of numbness or tingling in the lower extremities but she does have severe pain in her lower legs which is thought to be from peripheral neuropathy  Past Medical History:  Diagnosis Date   Anemia    Anxiety    Arthritis    Chronic kidney disease    Depression    Diabetes mellitus 2009   no meds   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, 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, recurrent most recent hopitalisation    Edema of both legs    GERD (gastroesophageal reflux disease)    Headache(784.0)    Hematoma of leg    left    High blood pressure    Hypertension    Katelyn Rowland  [id number]   Muscle cramps    Neck pain    PONV (postoperative nausea and vomiting)    Reflux    Thyroid  disorder    Past Surgical History:  Procedure Laterality Date   ABDOMINAL HYSTERECTOMY     APPENDECTOMY     BIOPSY N/A 11/20/2014   Procedure: BIOPSY;  Surgeon: Claudis RAYMOND Rivet, MD;  Location: [facility];  Service: Endoscopy;  Laterality: N/A;   CERVICAL SPINE SURGERY     ESOPHAGEAL DILATION N/A 11/20/2014   Procedure: ESOPHAGEAL DILATION 56 FR[address] MALONEY;  Surgeon: Claudis RAYMOND Rivet, MD;  Location: [facility];  Service: Endoscopy;  Laterality: N/A;   ESOPHAGOGASTRODUODENOSCOPY (EGD) WITH PROPOFOL  N/A 11/20/2014   Procedure: ESOPHAGOGASTRODUODENOSCOPY (EGD) WITH PROPOFOL ;  Surgeon: Claudis RAYMOND Rivet, MD;  Location: [facility];  Service: Endoscopy;  Laterality: N/A;   FOOT NEUROMA SURGERY     right    ivc filter placement, following recurrent left DVT  07/2009   NASAL SINUS SURGERY     THORACIC DISCECTOMY  06/12/2012   Procedure: THORACIC DISCECTOMY;  Surgeon: Catalina CHRISTELLA Stains, MD;  Location: [facility];  Service: Neurosurgery;   Laterality: N/A;  Thoracic Ten-Eleven Thoracic Laminectomy   TONSILLECTOMY     TONSILLECTOMY AND ADENOIDECTOMY     unilateal oopherectomy     VESICOVAGINAL FISTULA CLOSURE W/ TAH     Current Outpatient Medications  Medication Instructions   calcitRIOL  (ROCALTROL ) 0.25 MCG capsule TAKE (1) CAPSULE BY MOUTH THREE TIMES WEEKLY.   diphenhydrAMINE  (BENADRYL ) 25 mg, Every 8 hours PRN   esomeprazole  (NEXIUM ) 40 MG capsule TAKE ONE CAPSULE BY MOUTH DAILY BEFORE BREAKFAST.   levothyroxine  (SYNTHROID ) 100 mcg, Oral, Daily   lisinopril  (ZESTRIL ) 2.5 mg, Oral, Daily   methadone  (DOLOPHINE ) 10 MG tablet Take one tablet by mouth every 8 hours for chronic pain   methadone  (DOLOPHINE ) 10 mg, Oral, Every 8 hours   methadone  (DOLOPHINE ) 10 mg, Oral, Every 8 hours   naloxone  (NARCAN ) nasal spray 4 mg/0.1 mL One spray into each nostril once, as needed   NIFEdipine  (PROCARDIA  XL/NIFEDICAL XL) 60 mg, Oral, Daily   predniSONE  (DELTASONE ) 10 mg, Oral, 2 times daily with meals   tiZANidine  (ZANAFLEX ) 2 MG tablet TAKE 2TABLET BY MOUTH TWICE DAILY FOR SPASM.   tiZANidine  (ZANAFLEX ) 4 MG tablet TAKE 1 TABLET BY MOUTH TWICE DAILY FOR SPASM.   traZODone  (DESYREL ) 100 mg, Oral, Daily at bedtime   Social History   Tobacco  Use   Smoking status: Former    Current packs/day: 0.00    Average packs/day: 1 pack/day for 42.0 years (42.0 ttl pk-yrs)    Types: Cigarettes    Start date: 06/04/1968    Quit date: 06/04/2010    Years since quitting: 13.6   Smokeless tobacco: Never  Vaping Use   Vaping status: Never Used  Substance Use Topics   Alcohol use: No   Drug use: No   Family History  Problem Relation Age of Onset   Emphysema Sister    Kidney disease Sister    Diabetes Mother    Diabetes Father    Diabetes Brother    Arthritis Other    Kidney disease Other      BP 137/68   Pulse 99   Wt 155 lb (70.3 kg)   BMI 28.35 kg/m   Physical Exam Vitals and nursing note reviewed.  Constitutional:       Appearance: Normal appearance.  HENT:     Head: Normocephalic and atraumatic.   Eyes:     General: No scleral icterus.       Right eye: No discharge.        Left eye: No discharge.     Extraocular Movements: Extraocular movements intact.     Conjunctiva/sclera: Conjunctivae normal.     Pupils: Pupils are equal, round, and reactive to light.    Cardiovascular:     Rate and Rhythm: Normal rate.     Pulses: Normal pulses.   Musculoskeletal:     Comments: Bilateral knee  Both knees are in varus.  Range of motion is limited somewhat especially flexion there is a mild flexion contracture but her range of motion is probably from 3 up to 120 with a total arc of 117 degrees.  No evidence of instability in the joint  Small effusion may be detected   Skin:    General: Skin is warm and dry.     Capillary Refill: Capillary refill takes less than 2 seconds.   Neurological:     General: No focal deficit present.     Mental Status: She is alert and oriented to person, place, and time.     Sensory: No sensory deficit.     Gait: Gait abnormal.   Psychiatric:        Mood and Affect: Mood normal.        Behavior: Behavior normal.        Thought Content: Thought content normal.        Judgment: Judgment normal.    DG Knee AP/LAT W/Sunrise Left Result Date: 01/28/2024 Images both knees 3 views left knee Varus alignment to both knees grade 4 arthritis medial compartment Secondary bone changes include mild subchondral sclerosis on the tibia no osteophytes We also see significant osteopenia OA both knees grade 4 varus alignment   DG Knee AP/LAT W/Sunrise Right Result Date: 01/28/2024 Right knee imaging Right knee pain Varus alignment to both knees grade 4 arthritis medial compartment Secondary bone changes include mild subchondral sclerosis on the tibia no osteophytes We also see significant osteopenia OA both knees grade 4 varus alignment     Assessment and plan she does not want any  cortisone injections she says she gets a reaction from that but has taken oral prednisone  in the past which worked really well  She is agreeable to try that again  Meds ordered this encounter  Medications   predniSONE  (DELTASONE ) 10 MG tablet    Sig: Take  1 tablet (10 mg total) by mouth 2 (two) times daily with a meal.    Dispense:  60 tablet    Refill:  5    She can take oral only had issue with injection form

## 2024-01-28 NOTE — Progress Notes (Signed)
  Intake history:  There were no vitals taken for this visit. There is no height or weight on file to calculate BMI.    WHAT ARE WE SEEING YOU FOR TODAY?   bilateral knee(s) states right knee worse  How long has this bothered you? (DOI?DOS?WS?)  Since january month(s) ago  Anticoag.  No  Diabetes No  Heart disease No  Hypertension No  SMOKING HX No  Kidney disease Yes  Any ALLERGIES ______________________________________________   Treatment:  Have you taken:  Tylenol  No  Advil No  Had PT No  Had injection No  Other  _____methodone ____________________

## 2024-02-03 ENCOUNTER — Other Ambulatory Visit: Payer: Self-pay | Admitting: Family Medicine

## 2024-02-03 DIAGNOSIS — G8929 Other chronic pain: Secondary | ICD-10-CM

## 2024-02-11 ENCOUNTER — Other Ambulatory Visit: Payer: Self-pay | Admitting: Family Medicine

## 2024-02-14 ENCOUNTER — Encounter: Payer: Self-pay | Admitting: Family Medicine

## 2024-02-19 ENCOUNTER — Other Ambulatory Visit: Payer: Self-pay | Admitting: Family Medicine

## 2024-02-27 ENCOUNTER — Ambulatory Visit (INDEPENDENT_AMBULATORY_CARE_PROVIDER_SITE_OTHER): Payer: Self-pay | Admitting: Family Medicine

## 2024-02-27 ENCOUNTER — Encounter: Payer: Self-pay | Admitting: Family Medicine

## 2024-02-27 VITALS — BP 138/67 | HR 96 | Resp 16 | Ht 61.0 in | Wt 154.0 lb

## 2024-02-27 DIAGNOSIS — M160 Bilateral primary osteoarthritis of hip: Secondary | ICD-10-CM

## 2024-02-27 DIAGNOSIS — Z0001 Encounter for general adult medical examination with abnormal findings: Secondary | ICD-10-CM | POA: Diagnosis not present

## 2024-02-27 DIAGNOSIS — E559 Vitamin D deficiency, unspecified: Secondary | ICD-10-CM | POA: Diagnosis not present

## 2024-02-27 DIAGNOSIS — E038 Other specified hypothyroidism: Secondary | ICD-10-CM | POA: Diagnosis not present

## 2024-02-27 DIAGNOSIS — M818 Other osteoporosis without current pathological fracture: Secondary | ICD-10-CM

## 2024-02-27 DIAGNOSIS — G8929 Other chronic pain: Secondary | ICD-10-CM

## 2024-02-27 DIAGNOSIS — R7303 Prediabetes: Secondary | ICD-10-CM | POA: Diagnosis not present

## 2024-02-27 DIAGNOSIS — I10 Essential (primary) hypertension: Secondary | ICD-10-CM

## 2024-02-27 DIAGNOSIS — E785 Hyperlipidemia, unspecified: Secondary | ICD-10-CM | POA: Diagnosis not present

## 2024-02-27 MED ORDER — TIZANIDINE HCL 4 MG PO TABS: ORAL_TABLET | ORAL | 5 refills | Status: DC

## 2024-02-27 MED ORDER — DENOSUMAB 60 MG/ML ~~LOC~~ SOSY: 60.0000 mg | PREFILLED_SYRINGE | Freq: Once | SUBCUTANEOUS | 0 refills | Status: AC

## 2024-02-27 MED ORDER — METHADONE HCL 10 MG PO TABS: 10.0000 mg | ORAL_TABLET | Freq: Three times a day (TID) | ORAL | 0 refills | Status: AC

## 2024-02-27 NOTE — Progress Notes (Signed)
    Katelyn Rowland     MRN: [id number]      DOB: [birth year]  Chief Complaint  Patient presents with   Annual Exam    cpe    HPI: Patient is in for annual physical exam. Chronic pain management is also addressed Immunization is reviewed , refuses  PE: BP 138/67   Pulse 96   Resp 16   Ht 5' 1 (1.549 m)   Wt 154 lb 0.6 oz (69.9 kg)   SpO2 93%   BMI 29.11 kg/m   Pleasant  female, alert and oriented x 3, in no cardio-pulmonary distress. Afebrile. HEENT No facial trauma or asymetry. Sinuses non tender.  Extra occullar muscles intact.. External ears normal, . Neck: decreased ROM no adenopathy,JVD or thyromegaly.No bruits.  Chest: Clear to ascultation bilaterally.No crackles or wheezes. Non tender to palpation  Breast: Asymptomatic, not examned  Cardiovascular system; Heart sounds normal,  S1 and  S2 ,no S3.  No murmur, or thrill. Peripheral pulses normal.  Abdomen: Soft, non tender,l.    Musculoskeletal exam: Decreased ROM of spine, hips , shoulders and knees. deformity ,swelling or crepitus noted.  muscle wasting noted  Neurologic: Cranial nerves 2 to 12 intact. Power, tone ,sensation  normal throughout.  disturbance in gait. fine tremor.  Skin: Intact, no ulceration, erythema , scaling or rash noted. Pigmentation normal throughout  Psych; Normal mood and affect. Judgement and concentration normal   Assessment & Plan:  Encounter for chronic pain management The patient's Controlled Substance registry is reviewed and compliance confirmed. Adequacy of  Pain control and level of function is assessed. Medication dosing is adjusted as deemed appropriate. Twelve weeks of medication is prescribed , with a follow up appointment between 11 to 12 weeks .   Osteoporosis Start Prolia   Encounter for Medicare annual examination with abnormal findings Annual exam as documented. . Immunization and cancer screening needs are specifically addressed at this  visit.She wants neither

## 2024-02-27 NOTE — Patient Instructions (Addendum)
 F/U in 16 weeks  Hope that your appt in August for neuropathic pain goes well  L:abs today CBC, cmp and EGFr, TSH, magnesium  and vit D  Reduce prednisne 10 mg to one daily  Nurse pls work through prolia  order for treating osteoporosis and letpt know when she can start injections in the office   Tizanadine 4 mg one tablet twice daily is prescribed  Careful not to fall  Thanks for choosing [facility], we consider it a privelige to serve you.

## 2024-02-28 ENCOUNTER — Other Ambulatory Visit (HOSPITAL_COMMUNITY): Payer: Self-pay

## 2024-02-28 ENCOUNTER — Telehealth: Payer: Self-pay

## 2024-02-28 MED ORDER — DENOSUMAB 60 MG/ML ~~LOC~~ SOSY
60.0000 mg | PREFILLED_SYRINGE | Freq: Once | SUBCUTANEOUS | Status: AC
Start: 2024-03-13 — End: ?

## 2024-02-28 MED ORDER — DENOSUMAB 60 MG/ML ~~LOC~~ SOSY: 60.0000 mg | PREFILLED_SYRINGE | Freq: Once | SUBCUTANEOUS | Status: DC

## 2024-02-28 NOTE — Telephone Encounter (Signed)
 Medical   PA submitted via CMM. Key: [id number]   Pharmacy   Copay: $177.80

## 2024-02-28 NOTE — Telephone Encounter (Signed)
 SABRA

## 2024-02-28 NOTE — Telephone Encounter (Signed)
 Prolia VOB initiated via [email or web address]  Next Prolia inj DUE: NEW START

## 2024-02-29 NOTE — Telephone Encounter (Signed)
 Pt ready for scheduling for PROLIA  on or after : 02/29/24  Option# 1: Buy/Bill (Office supplied medication)  Out-of-pocket cost due at time of clinic visit: $347  Number of injection/visits approved: 2  Primary: [employer] Prolia  co-insurance: 20% Admin fee co-insurance: $15  Secondary: --- Prolia  co-insurance:  Admin fee co-insurance:   Medical Benefit Details: Date Benefits were checked: 02/28/24 Deductible: NO/ Coinsurance: 20%/ Admin Fee: $15  Prior Auth: APPROVED PA# [id number] Expiration Date: 02/28/24-08/06/24   # of doses approved: 2 ----------------------------------------------------------------------- Option# 2- Med Obtained from pharmacy:  Pharmacy benefit: Copay $822.19 (Paid to pharmacy) Admin Fee: $15 (Pay at clinic)  Prior Auth: N/A PA# Expiration Date:   # of doses approved:   If patient wants fill through the pharmacy benefit please send prescription to: [facility], and include estimated need by date in rx notes. Pharmacy will ship medication directly to the office.  Patient NOT eligible for Prolia  Copay Card. Copay Card can make patient's cost as little as $25. Link to apply: [email or web address]://[email or web address]  ** This summary of benefits is an estimation of the patient's out-of-pocket cost. Exact cost may very based on individual plan coverage.

## 2024-03-09 DIAGNOSIS — M810 Age-related osteoporosis without current pathological fracture: Secondary | ICD-10-CM | POA: Insufficient documentation

## 2024-03-09 MED ORDER — METHADONE HCL 10 MG PO TABS: 10.0000 mg | ORAL_TABLET | Freq: Three times a day (TID) | ORAL | 0 refills | Status: AC

## 2024-03-09 NOTE — Assessment & Plan Note (Signed)
 Annual exam as documented. . Immunization and cancer screening needs are specifically addressed at this visit.She wants neither

## 2024-03-09 NOTE — Assessment & Plan Note (Signed)
 The patient's Controlled Substance registry is reviewed and compliance confirmed. Adequacy of  Pain control and level of function is assessed. Medication dosing is adjusted as deemed appropriate. Twelve weeks of medication is prescribed , with a follow up appointment between 11 to 12 weeks .

## 2024-03-09 NOTE — Assessment & Plan Note (Signed)
Start Prolia

## 2024-03-10 ENCOUNTER — Other Ambulatory Visit: Payer: Self-pay | Admitting: Family Medicine

## 2024-03-12 ENCOUNTER — Ambulatory Visit: Payer: Self-pay

## 2024-03-12 NOTE — Telephone Encounter (Signed)
 FYI Only or Action Required?: Action required by provider: request for appointment. Asking to be worked in with her PCP.  Patient was last seen in primary care on 02/27/2024 by Antonetta Rollene BRAVO, MD.  Called Nurse Triage reporting Respiratory Distress.  Symptoms began several days ago.  Interventions attempted: Nothing.  Symptoms are: unchanged. Shortness of breath with exertion, started Saturday. No other symptoms.  Triage Disposition: See HCP Within 4 Hours (Or PCP Triage)  Patient/caregiver understands and will follow disposition?: Yes    Copied from CRM #[id number]. Topic: Clinical - Red Word Triage >> Mar 12, 2024 11:09 AM Elle L wrote: Red Word that prompted transfer to Nurse Triage: The patient is having difficulty breathing and she is having burning in her legs. Reason for Disposition  [1] MILD difficulty breathing (e.g., minimal/no SOB at rest, SOB with walking, pulse < 100) AND [2] NEW-onset or WORSE than normal  Answer Assessment - Initial Assessment Questions 1. RESPIRATORY STATUS: Describe your breathing? (e.g., wheezing, shortness of breath, unable to speak, severe coughing)      SOB with exertion 2. ONSET: When did this breathing problem begin?      Saturday 3. PATTERN Does the difficult breathing come and go, or has it been constant since it started?      Comes and goes 4. SEVERITY: How bad is your breathing? (e.g., mild, moderate, severe)      Severe at times 5. RECURRENT SYMPTOM: Have you had difficulty breathing before? If Yes, ask: When was the last time? and What happened that time?      no 6. CARDIAC HISTORY: Do you have any history of heart disease? (e.g., heart attack, angina, bypass surgery, angioplasty)      no 7. LUNG HISTORY: Do you have any history of lung disease?  (e.g., pulmonary embolus, asthma, emphysema)     no 8. CAUSE: What do you think is causing the breathing problem?      unsure 9. OTHER SYMPTOMS: Do you have any other  symptoms? (e.g., chest pain, cough, dizziness, fever, runny nose)     Leg pain 10. O2 SATURATION MONITOR:  Do you use an oxygen saturation monitor (pulse oximeter) at home? If Yes, ask: What is your reading (oxygen level) today? What is your usual oxygen saturation reading? (e.g., 95%)       no 11. PREGNANCY: Is there any chance you are pregnant? When was your last menstrual period?       no 12. TRAVEL: Have you traveled out of the country in the last month? (e.g., travel history, exposures)       no  Protocols used: Breathing Difficulty-A-AH

## 2024-03-13 NOTE — Telephone Encounter (Signed)
 I have tried two times in rthe pat 30 mins to make a f/u call with Katelyn Rowland, she is not answering and there is no mail box option, I will try to spweak with her grand son as he was in with her the lvisit before the last , however name not on DPR, this liost needs to be updated

## 2024-03-18 DIAGNOSIS — E038 Other specified hypothyroidism: Secondary | ICD-10-CM | POA: Diagnosis not present

## 2024-03-18 DIAGNOSIS — E559 Vitamin D deficiency, unspecified: Secondary | ICD-10-CM | POA: Diagnosis not present

## 2024-03-18 DIAGNOSIS — M160 Bilateral primary osteoarthritis of hip: Secondary | ICD-10-CM | POA: Diagnosis not present

## 2024-03-18 DIAGNOSIS — I10 Essential (primary) hypertension: Secondary | ICD-10-CM | POA: Diagnosis not present

## 2024-03-18 DIAGNOSIS — R7303 Prediabetes: Secondary | ICD-10-CM | POA: Diagnosis not present

## 2024-03-19 ENCOUNTER — Ambulatory Visit: Payer: Self-pay | Admitting: Family Medicine

## 2024-03-19 LAB — VITAMIN D 25 HYDROXY (VIT D DEFICIENCY, FRACTURES): Vit D, 25-Hydroxy: 37.1 ng/mL (ref 30.0–100.0)

## 2024-03-19 LAB — CBC WITH DIFFERENTIAL/PLATELET
Basophils Absolute: 0.1 x10E3/uL (ref 0.0–0.2)
Basos: 1 %
EOS (ABSOLUTE): 0.2 x10E3/uL (ref 0.0–0.4)
Eos: 3 %
Hematocrit: 36.4 % (ref 34.0–46.6)
Hemoglobin: 11.6 g/dL (ref 11.1–15.9)
Immature Grans (Abs): 0 x10E3/uL (ref 0.0–0.1)
Immature Granulocytes: 0 %
Lymphocytes Absolute: 1.2 x10E3/uL (ref 0.7–3.1)
Lymphs: 20 %
MCH: 32.1 pg (ref 26.6–33.0)
MCHC: 31.9 g/dL (ref 31.5–35.7)
MCV: 101 fL — ABNORMAL HIGH (ref 79–97)
Monocytes Absolute: 0.5 x10E3/uL (ref 0.1–0.9)
Monocytes: 8 %
Neutrophils Absolute: 4.1 x10E3/uL (ref 1.4–7.0)
Neutrophils: 68 %
Platelets: 190 x10E3/uL (ref 150–450)
RBC: 3.61 x10E6/uL — ABNORMAL LOW (ref 3.77–5.28)
RDW: 12.8 % (ref 11.7–15.4)
WBC: 6.2 x10E3/uL (ref 3.4–10.8)

## 2024-03-19 LAB — CMP14+EGFR
ALT: 5 IU/L (ref 0–32)
AST: 13 IU/L (ref 0–40)
Albumin: 4.2 g/dL (ref 3.7–4.7)
Alkaline Phosphatase: 69 IU/L (ref 44–121)
BUN/Creatinine Ratio: 12 (ref 12–28)
BUN: 29 mg/dL — ABNORMAL HIGH (ref 8–27)
Bilirubin Total: 0.3 mg/dL (ref 0.0–1.2)
CO2: 21 mmol/L (ref 20–29)
Calcium: 9.1 mg/dL (ref 8.7–10.3)
Chloride: 102 mmol/L (ref 96–106)
Creatinine, Ser: 2.42 mg/dL — ABNORMAL HIGH (ref 0.57–1.00)
Globulin, Total: 2.3 g/dL (ref 1.5–4.5)
Glucose: 89 mg/dL (ref 70–99)
Potassium: 4.9 mmol/L (ref 3.5–5.2)
Sodium: 139 mmol/L (ref 134–144)
Total Protein: 6.5 g/dL (ref 6.0–8.5)
eGFR: 19 mL/min/1.73 — ABNORMAL LOW (ref >59)

## 2024-03-19 LAB — TSH: TSH: 2.58 u[IU]/mL (ref 0.450–4.500)

## 2024-03-19 LAB — MAGNESIUM: Magnesium: 2.3 mg/dL (ref 1.6–2.3)

## 2024-03-19 NOTE — Progress Notes (Signed)
 Labs mailed

## 2024-03-26 ENCOUNTER — Ambulatory Visit: Payer: Medicare HMO

## 2024-03-26 VITALS — Ht 61.0 in | Wt 152.0 lb

## 2024-03-26 DIAGNOSIS — Z2821 Immunization not carried out because of patient refusal: Secondary | ICD-10-CM

## 2024-03-26 DIAGNOSIS — Z0000 Encounter for general adult medical examination without abnormal findings: Secondary | ICD-10-CM

## 2024-03-26 NOTE — Progress Notes (Signed)
 Please attest and cosign this visit due to patients primary care provider not being in the office at the time the visit was completed.   Subjective:   Katelyn Rowland is a 83 y.o. who presents for a Medicare Wellness preventive visit.  As a reminder, Annual Wellness Visits don't include a physical exam, and some assessments may be limited, especially if this visit is performed virtually. We may recommend an in-person follow-up visit with your provider if needed.  Visit Complete: Virtual I connected with  Katelyn Rowland on 03/26/24 by a audio enabled telemedicine application and verified that I am speaking with the correct person using two identifiers.  Patient Location: Home  Provider Location: Office/Clinic  I discussed the limitations of evaluation and management by telemedicine. The patient expressed understanding and agreed to proceed.  Vital Signs: Because this visit was a virtual/telehealth visit, some criteria may be missing or patient reported. Any vitals not documented were not able to be obtained and vitals that have been documented are patient reported.  VideoDeclined- This patient declined Librarian, academic. Therefore the visit was completed with audio only.  Persons Participating in Visit: Patient.  AWV Questionnaire: No: Patient Medicare AWV questionnaire was not completed prior to this visit.  Cardiac Risk Factors include: advanced age (>72men, >44 women);dyslipidemia;hypertension;sedentary lifestyle     Objective:    Today's Vitals   03/26/24 1317  Weight: 152 lb (68.9 kg)  Height: 5' 1 (1.549 m)   Body mass index is 28.72 kg/m.     03/26/2024    1:16 PM 03/26/2023    2:45 PM 02/28/2022    1:48 PM 02/28/2022    1:42 PM 01/26/2021    1:09 PM 03/02/2019    1:47 PM 01/07/2018    1:44 PM  Advanced Directives  Does Patient Have a Medical Advance Directive? Yes No  Yes No;Yes No Yes   Type of Estate agent of  [place];Living will   Healthcare Power of [place];Living will Living will;Healthcare Power of Attorney  Out of facility DNR (pink MOST or yellow form);Living will;Healthcare Power of Attorney  Does patient want to make changes to medical advance directive? No - Patient declined        Copy of Healthcare Power of Attorney in Chart? Yes - validated most recent copy scanned in chart (See row information)  Yes - validated most recent copy scanned in chart (See row information) Yes - validated most recent copy scanned in chart (See row information) Yes - validated most recent copy scanned in chart (See row information)  Yes   Would patient like information on creating a medical advance directive?  No - Patient declined   No - Patient declined No - Patient declined    Pre-existing out of facility DNR order (yellow form or pink MOST form)       Yellow form placed in chart (order not valid for inpatient use)      Data saved with a previous flowsheet row definition    Current Medications (verified) Outpatient Encounter Medications as of 03/26/2024  Medication Sig   calcitRIOL  (ROCALTROL ) 0.25 MCG capsule TAKE (1) CAPSULE BY MOUTH THREE TIMES WEEKLY.   diphenhydrAMINE  (BENADRYL ) 25 mg capsule Take 25 mg by mouth every 8 (eight) hours as needed for allergies.   esomeprazole  (NEXIUM ) 40 MG capsule TAKE ONE CAPSULE BY MOUTH DAILY BEFORE BREAKFAST.   levothyroxine  (SYNTHROID ) 100 MCG tablet TAKE ONE TABLET BY MOUTH ONCE DAILY.   lisinopril  (ZESTRIL )  2.5 MG tablet TAKE (1) TABLET BY MOUTH ONCE DAILY.   methadone  (DOLOPHINE ) 10 MG tablet Take one tablet by mouth every 8 hours for chronic pain   methadone  (DOLOPHINE ) 10 MG tablet Take 1 tablet (10 mg total) by mouth every 8 (eight) hours.   methadone  (DOLOPHINE ) 10 MG tablet Take 1 tablet (10 mg total) by mouth every 8 (eight) hours for 28 days.   [START ON 04/23/2024] methadone  (DOLOPHINE ) 10 MG tablet Take 1 tablet (10 mg total) by mouth every 8 (eight) hours  for 28 days.   [START ON 05/21/2024] methadone  (DOLOPHINE ) 10 MG tablet Take 1 tablet (10 mg total) by mouth every 8 (eight) hours for 28 days.   [START ON 06/18/2024] methadone  (DOLOPHINE ) 10 MG tablet Take 1 tablet (10 mg total) by mouth every 8 (eight) hours for 28 days.   naloxone  (NARCAN ) nasal spray 4 mg/0.1 mL One spray into each nostril once, as needed   NIFEdipine  (PROCARDIA  XL/NIFEDICAL XL) 60 MG 24 hr tablet Take 1 tablet (60 mg total) by mouth daily.   predniSONE  (DELTASONE ) 10 MG tablet Take 1 tablet (10 mg total) by mouth 2 (two) times daily with a meal.   tiZANidine  (ZANAFLEX ) 4 MG tablet TAKE 1 TABLET BY MOUTH TWICE DAILY FOR SPASM.   traZODone  (DESYREL ) 100 MG tablet TAKE 1 TABLET BY MOUTH AT BEDTIME.   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, recurrent most recent hopitalisation    Edema of both legs    GERD (gastroesophageal reflux disease)    Headache(784.0)    Hematoma of leg    left    High blood pressure    Hypertension    margaret simpson  [id number]   Muscle cramps    Neck pain    PONV (postoperative nausea and vomiting)    Reflux    Thyroid  disorder    Past Surgical History:  Procedure Laterality Date   ABDOMINAL HYSTERECTOMY     APPENDECTOMY     BIOPSY N/A 11/20/2014   Procedure: BIOPSY;  Surgeon: Claudis RAYMOND Rivet, MD;  Location: [facility];  Service: Endoscopy;  Laterality: N/A;   CERVICAL SPINE SURGERY     ESOPHAGEAL DILATION N/A 11/20/2014   Procedure: ESOPHAGEAL DILATION 56 FR[address] MALONEY;   Surgeon: Claudis RAYMOND Rivet, MD;  Location: [facility];  Service: Endoscopy;  Laterality: N/A;   ESOPHAGOGASTRODUODENOSCOPY (EGD) WITH PROPOFOL  N/A 11/20/2014   Procedure: ESOPHAGOGASTRODUODENOSCOPY (EGD) WITH PROPOFOL ;  Surgeon: Claudis RAYMOND Rivet, MD;  Location: [facility];  Service: Endoscopy;  Laterality: N/A;   FOOT NEUROMA SURGERY     right    ivc filter placement, following recurrent left DVT  07/2009   NASAL SINUS SURGERY     THORACIC DISCECTOMY  06/12/2012   Procedure: THORACIC DISCECTOMY;  Surgeon: Catalina CHRISTELLA Stains, MD;  Location: [facility];  Service: Neurosurgery;  Laterality: N/A;  Thoracic Ten-Eleven Thoracic Laminectomy   TONSILLECTOMY     TONSILLECTOMY AND ADENOIDECTOMY     unilateal oopherectomy     VESICOVAGINAL FISTULA CLOSURE W/  TAH     Family History  Problem Relation Age of Onset   Emphysema Sister    Kidney disease Sister    Diabetes Mother    Diabetes Father    Diabetes Brother    Arthritis Other    Kidney disease Other    Social History   Socioeconomic History   Marital status: Widowed    Spouse name: Not on file   Number of children: 4   Years of education: 14   Highest education level: Not on file  Occupational History   Occupation: disabled     Employer: RETIRED  Tobacco Use   Smoking status: Former    Current packs/day: 0.00    Average packs/day: 1 pack/day for 42.0 years (42.0 ttl pk-yrs)    Types: Cigarettes    Start date: 06/04/1968    Quit date: 06/04/2010    Years since quitting: 13.8   Smokeless tobacco: Never  Vaping Use   Vaping status: Never Used  Substance and Sexual Activity   Alcohol use: No   Drug use: No   Sexual activity: Never    Birth control/protection: Surgical  Other Topics Concern   Not on file  Social History Narrative   Not on file   Social Drivers of Health   Financial Resource Strain: Low Risk  (03/26/2024)   Overall Financial Resource Strain (CARDIA)    Difficulty of Paying Living Expenses: Not hard at all  Food  Insecurity: No Food Insecurity (03/26/2024)   Hunger Vital Sign    Worried About Running Out of Food in the Last Year: Never true    Ran Out of Food in the Last Year: Never true  Transportation Needs: No Transportation Needs (03/26/2024)   PRAPARE - Administrator, Civil Service (Medical): No    Lack of Transportation (Non-Medical): No  Physical Activity: Inactive (03/26/2024)   Exercise Vital Sign    Days of Exercise per Week: 0 days    Minutes of Exercise per Session: 0 min  Stress: No Stress Concern Present (03/26/2024)   [employer] of Occupational Health - Occupational Stress Questionnaire    Feeling of Stress: Not at all  Social Connections: Moderately Isolated (03/26/2024)   Social Connection and Isolation Panel    Frequency of Communication with Friends and Family: More than three times a week    Frequency of Social Gatherings with Friends and Family: More than three times a week    Attends Religious Services: More than 4 times per year    Active Member of [employer] or Organizations: No    Attends Banker Meetings: Never    Marital Status: Widowed    Tobacco Counseling Counseling given: Yes    Clinical Intake:  Pre-visit preparation completed: Yes  Pain : No/denies pain     BMI - recorded: 28.72 Nutritional Status: BMI 25 -29 Overweight Nutritional Risks: None Diabetes: No (per pt she is not diabetic)  Lab Results  Component Value Date   HGBA1C 5.1 11/14/2022   HGBA1C 5.4 09/06/2021   HGBA1C 5.8 (H) 02/23/2021     How often do you need to have someone help you when you read instructions, pamphlets, or other written materials from your doctor or pharmacy?: 1 - Never  Interpreter Needed?: No  Information entered by :: Kristeena Meineke W CMA (AAMA)   Activities of Daily Living     03/26/2024    1:22 PM  In your present state of health, do you have any difficulty performing  the following activities:  Hearing? 0  Vision? 0  Difficulty  concentrating or making decisions? 0  Walking or climbing stairs? 1  Dressing or bathing? 0  Doing errands, shopping? 1  Comment patient is homebound  Quarry manager and eating ? N  Using the Toilet? N  In the past six months, have you accidently leaked urine? Y  Do you have problems with loss of bowel control? Y  Managing your Medications? N  Managing your Finances? N  Housekeeping or managing your Housekeeping? Y  Comment has someone to come in once a month    Patient Care Team: Antonetta Rollene BRAVO, MD as PCP - General Margrette Taft BRAVO, MD as Consulting Physician (Orthopedic Surgery)  I have updated your Care Teams any recent Medical Services you may have received from other providers in the past year.     Assessment:   This is a routine wellness examination for Katelyn Rowland.  Hearing/Vision screen Hearing Screening - Comments:: Patient denies any hearing difficulties.   Vision Screening - Comments:: Patient is not up to date on yearly eye exams.  Patient does not have an eye doctor and declines a list of resources today.     Goals Addressed               This Visit's Progress     Be pain free (pt-stated)        I just want to be pain free      Patient Stated (pt-stated)   On track     Patient stated remain independent.        Depression Screen     03/26/2024    1:24 PM 02/27/2024    2:45 PM 11/07/2023    3:37 PM 08/09/2023    2:36 PM 05/22/2023    2:54 PM 03/26/2023    3:17 PM 03/01/2023    2:09 PM  PHQ 2/9 Scores  PHQ - 2 Score 0 0 0 0 0 0 0  PHQ- 9 Score 2    0 0 1    Fall Risk     03/26/2024    1:21 PM 02/27/2024    2:45 PM 11/07/2023    3:37 PM 10/02/2023    2:22 PM 08/09/2023    2:36 PM  Fall Risk   Falls in the past year? 0 0 0 0 0  Number falls in past yr: 0 0 0 0 0  Injury with Fall? 0 0 0 0 0  Risk for fall due to : Impaired balance/gait;Impaired mobility;Orthopedic patient   Impaired mobility;Impaired balance/gait;Other (Comment) No Fall Risks   Risk for fall due to: Comment    age   Follow up Falls prevention discussed;Education provided;Falls evaluation completed Falls evaluation completed  Follow up appointment Falls evaluation completed    MEDICARE RISK AT HOME:  Medicare Risk at Home Any stairs in or around the home?: Yes If so, are there any without handrails?: No Home free of loose throw rugs in walkways, pet beds, electrical cords, etc?: Yes Adequate lighting in your home to reduce risk of falls?: Yes Life alert?: No Use of a cane, walker or w/c?: Yes Grab bars in the bathroom?: Yes Shower chair or bench in shower?: Yes Elevated toilet seat or a handicapped toilet?: Yes  TIMED UP AND GO:  Was the test performed?  No  Cognitive Function: 6CIT completed    12/10/2015    2:15 PM  MMSE - Mini Mental State Exam  Orientation to time 5  Orientation to Place 5   Registration 3   Attention/ Calculation 5   Recall 3   Language- name 2 objects 2   Language- repeat 1  Language- follow 3 step command 3   Language- read & follow direction 1   Write a sentence 1   Copy design 0   Total score 29      Data saved with a previous flowsheet row definition        03/26/2024    1:24 PM 03/26/2023    3:15 PM 02/28/2022    1:43 PM 01/26/2021    1:16 PM 01/26/2020    1:24 PM  6CIT Screen  What Year? 0 points 0 points 0 points 0 points 0 points  What month? 0 points 0 points 0 points 0 points 0 points  What time? 0 points 0 points 0 points 0 points 0 points  Count back from 20 0 points 0 points 0 points 0 points 0 points  Months in reverse 0 points 0 points 0 points 0 points 0 points  Repeat phrase 0 points 0 points 0 points 0 points 2 points  Total Score 0 points 0 points 0 points 0 points 2 points    Immunizations Immunization History  Administered Date(s) Administered   Pneumococcal Polysaccharide-23 03/26/2006   Td 12/30/2003    Screening Tests Health Maintenance  Topic Date Due   COVID-19 Vaccine (1 -  2024-25 season) Never done   INFLUENZA VACCINE  03/07/2024   Zoster Vaccines- Shingrix (1 of 2) 05/29/2024 (Originally 01/21/1990)   Pneumococcal Vaccine: 50+ Years (2 of 2 - PCV) 02/26/2025 (Originally 03/27/2007)   Medicare Annual Wellness (AWV)  03/26/2025   DEXA SCAN  Completed   HPV VACCINES  Aged Out   Meningococcal B Vaccine  Aged Out   DTaP/Tdap/Td  Discontinued    Health Maintenance  Health Maintenance Due  Topic Date Due   COVID-19 Vaccine (1 - 2024-25 season) Never done   INFLUENZA VACCINE  03/07/2024   Health Maintenance Items Addressed: Patient no longer has mammograms or bone density scans. Declines all vaccines  Additional Screening:  Vision Screening: Recommended annual ophthalmology exams for early detection of glaucoma and other disorders of the eye. Would you like a referral to an eye doctor? No    Dental Screening: Recommended annual dental exams for proper oral hygiene  Community Resource Referral / Chronic Care Management: CRR required this visit?  No   CCM required this visit?  No   Plan:    I have personally reviewed and noted the following in the patient's chart:   Medical and social history Use of alcohol, tobacco or illicit drugs  Current medications and supplements including opioid prescriptions. Patient is currently taking opioid prescriptions. Information provided to patient regarding non-opioid alternatives. Patient advised to discuss non-opioid treatment plan with their provider. Functional ability and status Nutritional status Physical activity Advanced directives List of other physicians Hospitalizations, surgeries, and ER visits in previous 12 months Vitals Screenings to include cognitive, depression, and falls Referrals and appointments  In addition, I have reviewed and discussed with patient certain preventive protocols, quality metrics, and best practice recommendations. A written personalized care plan for preventive services as  well as general preventive health recommendations were provided to patient.   Tommey Barret, CMA   03/26/2024   After Visit Summary: (Mail) Due to this being a telephonic visit, the after visit summary with patients personalized plan was offered to patient via mail   Notes: Nothing  significant to report at this time.

## 2024-03-26 NOTE — Patient Instructions (Signed)
 Katelyn Rowland , Thank you for taking time out of your busy schedule to complete your Annual Wellness Visit with me. I enjoyed our conversation and look forward to speaking with you again next year. I, as well as your care team,  appreciate your ongoing commitment to your health goals. Please review the following plan we discussed and let me know if I can assist you in the future.  Your Game plan/ To Do List   Follow up Visits: We will see or speak with you next year for your Next Medicare AWV with our clinical staff   Clinician Recommendations:  Aim for 30 minutes of exercise or brisk walking, 6-8 glasses of water , and 5 servings of fruits and vegetables each day.    Wishing you many blessings and good health during the next year until our next visit.  -Katelyn Rowland   This is a list of the screenings recommended for you:  Health Maintenance  Topic Date Due   COVID-19 Vaccine (1 - 2024-25 season) Never done   Flu Shot  03/07/2024   Zoster (Shingles) Vaccine (1 of 2) 05/29/2024*   Pneumococcal Vaccine for age over 58 (2 of 2 - PCV) 02/26/2025*   Medicare Annual Wellness Visit  03/26/2025   DEXA scan (bone density measurement)  Completed   HPV Vaccine  Aged Out   Meningitis B Vaccine  Aged Out   DTaP/Tdap/Td vaccine  Discontinued  *Topic was postponed. The date shown is not the original due date.    Advanced directives: (Declined) Advance directive discussed with you today. Even though you declined this today, please call our office should you change your mind, and we can give you the proper paperwork for you to fill out. Advance Care Planning is important because it:  [x]  Makes sure you receive the medical care that is consistent with your values, goals, and preferences  [x]  It provides guidance to your family and loved ones and reduces their decisional burden about whether or not they are making the right decisions based on your wishes.  Follow the link provided in your after visit summary or  read over the paperwork we have mailed to you to help you started getting your Advance Directives in place. If you need assistance in completing these, please reach out to us  so that we can help you!  See attachments for Preventive Care and Fall Prevention Tips.

## 2024-04-02 ENCOUNTER — Telehealth: Payer: Self-pay

## 2024-04-02 NOTE — Telephone Encounter (Signed)
 Copied from CRM #[id number]. Topic: Appointments - Scheduling Inquiry for Clinic >> Apr 02, 2024  2:33 PM Rosaria BRAVO wrote: Reason for CRM: Pt called reporting that she needs to be seen before her medications run out on November 11th. Please advise

## 2024-04-02 NOTE — Telephone Encounter (Signed)
 Pt informed to keep the November 20th appt as dr simpson has nothing sooner. Enough Methadone  is future dated to get her through December, all other medications will be refilled as needed

## 2024-04-10 ENCOUNTER — Ambulatory Visit: Payer: Self-pay

## 2024-04-10 NOTE — Telephone Encounter (Signed)
 FYI Only or Action Required?: Action required by provider: Refusing disposition due to transportation, no PCP availability, needs call back with further recommendations/earlier appt, states she'll go to UC first chance she gets.  Patient was last seen in primary care on 02/27/2024 by Antonetta Rollene BRAVO, MD.  Called Nurse Triage reporting skin lesion, spreading redness, Pain, Pruritis, and spreading and growing sores.  Symptoms began several weeks ago.  Interventions attempted: Other: PCP visit in July.  Symptoms are: gradually worsening.  Triage Disposition: See HCP Within 4 Hours (Or PCP Triage)  Patient/caregiver understands and will follow disposition?: No, refuses disposition      Message from Vaiden G sent at 04/10/2024  1:41 PM EDT  pt got sores on her leg and needs appt asap   Reason for Disposition  [1] Looks infected (e.g., spreading redness, pus) AND [2] large red area (> 2 inches or 5 cm)  Answer Assessment - Initial Assessment Questions 1. APPEARANCE of SORES: What do the sores look like?     Some red, beet red, feel rough like sandpaper, no open wounds, covered with, one leaking clear liquid, liquid hardened into scab No black or purple 2. NUMBER: How many sores are there?     4 sores and new ones starting 3. SIZE: How big is the largest sore?     3x4 inches 4. LOCATION: Where are the sores located?     2 near ankle like got a scab on it size of nickel, other leg pretty bad, scab has turned white around it None on face or genitals Spreading up the legs 5. ONSET: When did the sores begin?     Little bit in July, might have shown it to her then but wasn't that bad, but gotten much worse 6. TENDER: Does it hurt when you touch it?  (Scale 1-10; or mild, moderate, severe)      Hurt and they itch, right now not suffering any pain, usually do mostly when walking 8. OTHER SYMPTOMS: Do you have any other symptoms? (e.g., fever, new weakness)     Spreading  redness around the sores No fever, chills Already have nerve damage Legs and feet burn, started in December, gotten no better but no worse since July appt with PCP   Will wait until can see Dr. Antonetta in November but if gets really bad then will go to UC Advised be seen today so advised UC today if her preference Have to wait until someone can take me probably won't be until next week, go first chance I get I'll never go to ED   Advised pt be examined today for symptoms, no PCP availability, refusing region availability tomorrow. Advised pt go to UC asap, pt states she will go as soon as she gets transportation which may not be today. Sending message to PCP office for call back with earlier appt/further recommendations. Advised ED if worsening, pt refusing ED ever, advised UC if worsening if refusing ED.  Protocols used: [employer]

## 2024-04-11 NOTE — Telephone Encounter (Signed)
 Tried calling, n/a, vm not set up

## 2024-04-14 ENCOUNTER — Ambulatory Visit: Payer: Self-pay

## 2024-04-14 NOTE — Telephone Encounter (Signed)
Patient advised to go to [facility].

## 2024-04-14 NOTE — Telephone Encounter (Signed)
 FYI Only or Action Required?: FYI only for provider.  Patient was last seen in primary care on 02/27/2024 by Antonetta Rollene BRAVO, MD.  Called Nurse Triage reporting Leg Problem.  Symptoms began several days ago.  Interventions attempted: Rest, hydration, or home remedies.  Symptoms are: gradually worsening.  Triage Disposition: See HCP Within 4 Hours (Or PCP Triage)  Patient/caregiver understands and will follow disposition?: Yes, will follow disposition  Copied from CRM [id number]. Topic: Clinical - Red Word Triage >> Apr 14, 2024  1:35 PM Charlet HERO wrote: Red Word that prompted transfer to Nurse Triage: Patient is calling about sores on her legs she is stating that they are weeping/ draining she is stating that she has burning in them. Reason for Disposition  SEVERE leg swelling (e.g., swelling extends above knee, entire leg is swollen, weeping fluid)  Answer Assessment - Initial Assessment Questions 1. ONSET: When did the swelling start? (e.g., minutes, hours, days)     Denies swelling 2. LOCATION: What part of the leg is swollen?  Are both legs swollen or just one leg?     Bilateral, R is worse 4. REDNESS: Is there redness or signs of infection?     Positive redness 5. PAIN: Is the swelling painful to touch? If Yes, ask: How painful is it?   (Scale 1-10; mild, moderate or severe)     Sometimes it reaches 10, currently 8 6. FEVER: Do you have a fever? If Yes, ask: What is it, how was it measured, and when did it start?      denies 7. CAUSE: What do you think is causing the leg swelling?     psoriasis 9. RECURRENT SYMPTOM: Have you had leg swelling before? If Yes, ask: When was the last time? What happened that time?     denies 10. OTHER SYMPTOMS: Do you have any other symptoms? (e.g., chest pain, difficulty breathing)       Weeping, open sores on legs  Pt states overall her legs having been painful since Christmas. Pt states that weeping started  several days ago. Pt advised to go to UC, no clinic appt available.  Protocols used: Leg Swelling and Edema-A-AH

## 2024-04-15 ENCOUNTER — Ambulatory Visit: Admission: EM | Admit: 2024-04-15 | Discharge: 2024-04-15 | Disposition: A | Discharge status: Home / Self Care

## 2024-04-15 ENCOUNTER — Encounter: Payer: Self-pay | Admitting: Emergency Medicine

## 2024-04-15 ENCOUNTER — Other Ambulatory Visit: Payer: Self-pay | Admitting: Family Medicine

## 2024-04-15 DIAGNOSIS — L97209 Non-pressure chronic ulcer of unspecified calf with unspecified severity: Secondary | ICD-10-CM | POA: Diagnosis not present

## 2024-04-15 DIAGNOSIS — I83002 Varicose veins of unspecified lower extremity with ulcer of calf: Secondary | ICD-10-CM | POA: Diagnosis not present

## 2024-04-15 DIAGNOSIS — N184 Chronic kidney disease, stage 4 (severe): Secondary | ICD-10-CM

## 2024-04-15 MED ORDER — BACITRACIN ZINC 500 UNIT/GM EX OINT
TOPICAL_OINTMENT | Freq: Once | CUTANEOUS | Status: AC
  Administered 2024-04-15: 1 via TOPICAL

## 2024-04-15 MED ORDER — MUPIROCIN 2 % EX OINT: 1.0000 | TOPICAL_OINTMENT | Freq: Every day | CUTANEOUS | 2 refills | Status: AC

## 2024-04-15 NOTE — Discharge Instructions (Addendum)
  1. Venous stasis ulcer of calf, unspecified laterality, unspecified ulcer stage, unspecified whether varicose veins present (HCC) (Primary) - Apply dressing in UC for skin protection and aid of healing until follow-up with wound care center - bacitracin  ointment - AMB referral to wound care center for follow-up evaluation and ongoing management of venous stasis with weeping ulcers. - mupirocin  ointment (BACTROBAN ) 2 %; Apply 1 Application topically daily.  Dispense: 66 g; Refill: 2

## 2024-04-15 NOTE — ED Triage Notes (Signed)
 Sores on both lower legs x 5 weeks.  States areas ooze.  Redness above sores on both legs.

## 2024-04-15 NOTE — ED Provider Notes (Signed)
 [facility]   Note:  This document was prepared using Dragon voice recognition software and may include unintentional dictation errors.  MRN: [id number] DOB: March 16, 1941  Subjective:   Katelyn Rowland is a 83 y.o. female presenting for bilateral lower extremity erythema, swelling, ulcers.  Patient reports that she has had symptoms for approximately 5 weeks.  Patient states that ulcers on lower legs weep clearish yellow fluid which crusts and causes accumulation around the edges of the ulcers.  Patient reports minimal pain secondary to ulcers.  Patient denies any injury to the skin.  Patient denies seeing any wound care or dermatologist for symptoms.  Patient has not covered wounds or used antibiotic ointment because she was not sure what she should use.  Patient seeking guidance for what she can do to fix swelling and lower extremity ulcers.   Current Facility-Administered Medications:    denosumab  (PROLIA ) injection 60 mg, 60 mg, Subcutaneous, Once, Antonetta Rollene BRAVO, MD  Current Outpatient Medications:    mupirocin  ointment (BACTROBAN ) 2 %, Apply 1 Application topically daily., Disp: 66 g, Rfl: 2   calcitRIOL  (ROCALTROL ) 0.25 MCG capsule, TAKE (1) CAPSULE BY MOUTH THREE TIMES WEEKLY., Disp: 10 capsule, Rfl: 2   diphenhydrAMINE  (BENADRYL ) 25 mg capsule, Take 25 mg by mouth every 8 (eight) hours as needed for allergies., Disp: , Rfl:    esomeprazole  (NEXIUM ) 40 MG capsule, TAKE ONE CAPSULE BY MOUTH DAILY BEFORE BREAKFAST., Disp: 30 capsule, Rfl: 5   levothyroxine  (SYNTHROID ) 100 MCG tablet, TAKE ONE TABLET BY MOUTH ONCE DAILY., Disp: 90 tablet, Rfl: 2   lisinopril  (ZESTRIL ) 2.5 MG tablet, TAKE (1) TABLET BY MOUTH ONCE DAILY., Disp: 30 tablet, Rfl: 5   methadone  (DOLOPHINE ) 10 MG tablet, Take one tablet by mouth every 8 hours for chronic pain, Disp: 84 tablet, Rfl: 0   methadone  (DOLOPHINE ) 10 MG tablet, Take 1 tablet (10 mg total) by mouth every 8 (eight) hours., Disp: 84 tablet,  Rfl: 0   methadone  (DOLOPHINE ) 10 MG tablet, Take 1 tablet (10 mg total) by mouth every 8 (eight) hours for 28 days., Disp: 84 tablet, Rfl: 0   [START ON 04/23/2024] methadone  (DOLOPHINE ) 10 MG tablet, Take 1 tablet (10 mg total) by mouth every 8 (eight) hours for 28 days., Disp: 84 tablet, Rfl: 0   [START ON 05/21/2024] methadone  (DOLOPHINE ) 10 MG tablet, Take 1 tablet (10 mg total) by mouth every 8 (eight) hours for 28 days., Disp: 84 tablet, Rfl: 0   [START ON 06/18/2024] methadone  (DOLOPHINE ) 10 MG tablet, Take 1 tablet (10 mg total) by mouth every 8 (eight) hours for 28 days., Disp: 84 tablet, Rfl: 0   naloxone  (NARCAN ) nasal spray 4 mg/0.1 mL, One spray into each nostril once, as needed, Disp: 1 each, Rfl: 1   NIFEdipine  (PROCARDIA  XL/NIFEDICAL XL) 60 MG 24 hr tablet, Take 1 tablet (60 mg total) by mouth daily., Disp: 30 tablet, Rfl: 4   tiZANidine  (ZANAFLEX ) 4 MG tablet, TAKE 1 TABLET BY MOUTH TWICE DAILY FOR SPASM., Disp: 60 tablet, Rfl: 5   traZODone  (DESYREL ) 100 MG tablet, TAKE 1 TABLET BY MOUTH AT BEDTIME., Disp: 30 tablet, Rfl: 5   Allergies  Allergen Reactions   Prednisone      ONLY THE CORTISONE INJECTIONS. Causes body to burn, like blood is burning body.    Acetaminophen  Other (See Comments)    Muscle Cramps.    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, recurrent most recent hopitalisation    Edema of both legs    GERD (gastroesophageal reflux disease)     Headache(784.0)    Hematoma of leg    left    High blood pressure    Hypertension    margaret simpson  [id number]   Muscle cramps    Neck pain    PONV (postoperative nausea and vomiting)    Reflux    Thyroid  disorder      Past Surgical History:  Procedure Laterality Date   ABDOMINAL HYSTERECTOMY     APPENDECTOMY     BIOPSY N/A 11/20/2014   Procedure: BIOPSY;  Surgeon: Claudis RAYMOND Rivet, MD;  Location: [facility];  Service: Endoscopy;  Laterality: N/A;   CERVICAL SPINE SURGERY     ESOPHAGEAL DILATION N/A 11/20/2014   Procedure: ESOPHAGEAL DILATION 56 FREN[address] MALONEY;  Surgeon: Claudis RAYMOND Rivet, MD;  Location: [facility];  Service: Endoscopy;  Laterality: N/A;   ESOPHAGOGASTRODUODENOSCOPY (EGD) WITH PROPOFOL  N/A 11/20/2014   Procedure: ESOPHAGOGASTRODUODENOSCOPY (EGD) WITH PROPOFOL ;  Surgeon: Claudis RAYMOND Rivet, MD;  Location: [facility];  Service: Endoscopy;  Laterality: N/A;   FOOT NEUROMA SURGERY     right    ivc filter placement, following recurrent left DVT  07/2009   NASAL SINUS SURGERY     THORACIC DISCECTOMY  06/12/2012   Procedure: THORACIC DISCECTOMY;  Surgeon: Catalina CHRISTELLA Stains, MD;  Location: [facility];  Service: Neurosurgery;  Laterality: N/A;  Thoracic Ten-Eleven Thoracic Laminectomy   TONSILLECTOMY     TONSILLECTOMY AND ADENOIDECTOMY     unilateal oopherectomy     VESICOVAGINAL FISTULA CLOSURE W/ TAH      Family History  Problem Relation Age of Onset   Emphysema Sister    Kidney disease Sister    Diabetes Mother    Diabetes Father    Diabetes Brother    Arthritis Other    Kidney disease Other     Social History   Tobacco Use   Smoking status: Former    Current packs/day: 0.00    Average packs/day: 1 pack/day for 42.0 years (42.0 ttl pk-yrs)    Types: Cigarettes    Start date: 06/04/1968    Quit date: 06/04/2010    Years since quitting: 13.8   Smokeless tobacco: Never  Vaping Use   Vaping status: Never Used  Substance Use Topics   Alcohol use: No   Drug use: No     ROS Refer to HPI for ROS details.  Objective:   Vitals: BP (!) 174/86 (BP Location: Left Arm)   Pulse 90   Temp 98 F (36.7 C) (Oral)   Resp 20   SpO2 91%   Physical Exam Vitals and nursing note reviewed.  Constitutional:      General: She is not in acute distress.    Appearance: Normal appearance. She is not ill-appearing.  HENT:     Head: Normocephalic.  Cardiovascular:     Rate and Rhythm: Normal rate.  Pulmonary:     Effort: Pulmonary effort is normal. No respiratory distress.  Skin:    General: Skin is warm and dry.     Capillary Refill: Capillary refill takes less than 2 seconds.     Findings: Erythema and wound present.  Neurological:     General: No focal deficit present.     Mental Status: She is alert and oriented to person, place, and time.  Psychiatric:        Mood and Affect: Mood normal.        Behavior: Behavior normal.     Procedures  No results found for this or any previous visit (from the past 24 hours).  No results found.   Assessment and Plan :     Discharge Instructions       1. Venous stasis ulcer of calf, unspecified laterality, unspecified ulcer stage, unspecified whether varicose veins present (HCC) (Primary) - Apply dressing in UC for skin protection and aid of healing until follow-up with wound care center - bacitracin  ointment - AMB referral to wound care center for follow-up evaluation and ongoing management of venous stasis with weeping ulcers. - mupirocin  ointment (BACTROBAN ) 2 %; Apply 1 Application topically daily.  Dispense: 66 g; Refill: 2      Ethel KATHEE Aurea Aurea, Clayton B, NP 04/15/24 1517

## 2024-04-16 ENCOUNTER — Telehealth: Payer: Self-pay

## 2024-04-16 NOTE — Telephone Encounter (Signed)
 Copied from CRM #[id number]. Topic: Clinical - Medical Advice >> Apr 16, 2024  1:59 PM Katelyn Rowland wrote: Reason for CRM: Patient went to the urgent care for ulcers on her leg and would like to know if theres any nurse home visit that she can receive to help her with the dressing on the leg as she cannot do it

## 2024-04-17 ENCOUNTER — Ambulatory Visit: Payer: Self-pay

## 2024-04-17 NOTE — Telephone Encounter (Signed)
**Note De-Identified  Obfuscation** Called patient no answer.

## 2024-04-17 NOTE — Telephone Encounter (Signed)
 Pt states that she was seen in the UC and is not better nor worse than she was. Pt would like HH RN to come to her house every day to wrap her legs. Pt wants appt, no appts available at [facility]. Routing to clinic.   Copied from CRM [id number]. Topic: Clinical - Red Word Triage >> Apr 17, 2024  4:00 PM Antwanette L wrote: Red Word that prompted transfer to Nurse Triage: Pt has ulcers on both legs and its causing pain and itchiness. Pt was seen at [facility] on 9/9

## 2024-04-18 ENCOUNTER — Telehealth: Payer: Self-pay

## 2024-04-18 NOTE — Telephone Encounter (Signed)
Please refer to other TE

## 2024-04-18 NOTE — Telephone Encounter (Signed)
 Copied from CRM [id number]. Topic: Clinical - Medical Advice >> Apr 17, 2024  3:55 PM Antwanette L wrote: Reason for CRM: Patient was seen [facility] due to ulcers on both legs. The wounds needs dressing and the patient is requesting a nurse to come out and help w/ the wounds. The patient can be contacted at [phone].

## 2024-04-21 ENCOUNTER — Encounter (HOSPITAL_BASED_OUTPATIENT_CLINIC_OR_DEPARTMENT_OTHER): Attending: Internal Medicine | Admitting: Internal Medicine

## 2024-04-21 DIAGNOSIS — I87303 Chronic venous hypertension (idiopathic) without complications of bilateral lower extremity: Secondary | ICD-10-CM | POA: Diagnosis not present

## 2024-04-21 DIAGNOSIS — Z87891 Personal history of nicotine dependence: Secondary | ICD-10-CM | POA: Insufficient documentation

## 2024-05-21 ENCOUNTER — Other Ambulatory Visit: Payer: Self-pay | Admitting: Family Medicine

## 2024-06-26 ENCOUNTER — Encounter: Payer: Self-pay | Admitting: Family Medicine

## 2024-06-26 ENCOUNTER — Ambulatory Visit: Admitting: Family Medicine

## 2024-06-26 VITALS — BP 138/72 | HR 94 | Resp 16 | Ht 61.0 in | Wt 148.1 lb

## 2024-06-26 DIAGNOSIS — K222 Esophageal obstruction: Secondary | ICD-10-CM

## 2024-06-26 DIAGNOSIS — Z9181 History of falling: Secondary | ICD-10-CM

## 2024-06-26 DIAGNOSIS — K219 Gastro-esophageal reflux disease without esophagitis: Secondary | ICD-10-CM

## 2024-06-26 DIAGNOSIS — E785 Hyperlipidemia, unspecified: Secondary | ICD-10-CM

## 2024-06-26 DIAGNOSIS — Z2882 Immunization not carried out because of caregiver refusal: Secondary | ICD-10-CM

## 2024-06-26 DIAGNOSIS — I10 Essential (primary) hypertension: Secondary | ICD-10-CM | POA: Diagnosis not present

## 2024-06-26 DIAGNOSIS — G8929 Other chronic pain: Secondary | ICD-10-CM

## 2024-06-26 DIAGNOSIS — N184 Chronic kidney disease, stage 4 (severe): Secondary | ICD-10-CM

## 2024-06-26 DIAGNOSIS — E038 Other specified hypothyroidism: Secondary | ICD-10-CM

## 2024-06-26 MED ORDER — TIZANIDINE HCL 4 MG PO TABS: ORAL_TABLET | ORAL | 5 refills | Status: AC

## 2024-06-26 MED ORDER — TRAZODONE HCL 100 MG PO TABS
100.0000 mg | ORAL_TABLET | Freq: Every day | ORAL | 5 refills | Status: AC
Start: 2024-06-26 — End: ?

## 2024-06-26 MED ORDER — METHADONE HCL 10 MG PO TABS: ORAL_TABLET | ORAL | 0 refills | Status: AC

## 2024-06-26 MED ORDER — NIFEDIPINE ER 60 MG PO TB24: 60.0000 mg | ORAL_TABLET | Freq: Every day | ORAL | 11 refills | Status: AC

## 2024-06-26 MED ORDER — LISINOPRIL 2.5 MG PO TABS: 2.5000 mg | ORAL_TABLET | Freq: Every day | ORAL | 11 refills | Status: AC

## 2024-06-26 NOTE — Assessment & Plan Note (Signed)
 Updated lab needed at/ before next visit.

## 2024-06-26 NOTE — Assessment & Plan Note (Signed)
 Controlled, no change in medication

## 2024-06-26 NOTE — Assessment & Plan Note (Signed)
constipation Updated lab needed at/ before next visit.

## 2024-06-26 NOTE — Assessment & Plan Note (Signed)
 Hyperlipidemia:Low fat diet discussed and encouraged.   Lipid Panel  Lab Results  Component Value Date   CHOL 210 (H) 08/09/2023   HDL 72 08/09/2023   LDLCALC 119 (H) 08/09/2023   TRIG 108 08/09/2023   CHOLHDL 2.9 08/09/2023     Updated lab needed at/ before next visit.

## 2024-06-26 NOTE — Patient Instructions (Signed)
 F/U in 4 months  Great blood pressure  No changes in your medication  Be careful not to fall  Please get fasting labs 1 week before your next appointment it will include urine testing the order will be mailed to you

## 2024-06-26 NOTE — Assessment & Plan Note (Signed)
 Home safety reviewed briefly, no falls since last visit

## 2024-06-26 NOTE — Assessment & Plan Note (Signed)
 Denies dysphagia and controlled on current medication

## 2024-06-26 NOTE — Assessment & Plan Note (Signed)
 Unchanged, offered vaccines , refuses

## 2024-06-26 NOTE — Progress Notes (Signed)
   Katelyn Rowland     MRN: [id number]      DOB: [birth year]  Chief Complaint  Patient presents with   Hypertension    Follow up     HPI Katelyn Rowland is here for follow up and re-evaluation of chronic medical conditions, medication management and review of any available recent lab and radiology data.  Preventive health is updated, specifically  Cancer screening and Immunization.   [place] at urgent care and the wound clinic in [place] due to venous stasis ulcers, much improved, she has difficulty elevating both legs but I okayed doing one at a time, also I explained how to safely use triamcinolone cream when burning is excessive, as sparingly and infrequently as possible The PT denies any adverse reactions to current medications since the last visit.  Chronic pain unchanged Denies falls ROS Denies recent fever or chills. Denies sinus pressure, nasal congestion, ear pain or sore throat. Denies chest congestion, productive cough or wheezing. Denies chest pains, palpitations and leg swelling Denies abdominal pain, nausea, vomiting,diarrhea or constipation.   Denies dysuria, frequency, hesitancy or incontinence. . Denies depression, anxiety or insomnia. Denies skin break down or rash.   PE  BP 138/72   Pulse 94   Resp 16   Ht 5' 1 (1.549 m)   Wt 148 lb 1.3 oz (67.2 kg)   SpO2 96%   BMI 27.98 kg/m   Patient alert and oriented and in no cardiopulmonary distress.  HEENT: No facial asymmetry, EOMI,     Neck decreased ROM  Chest: Clear to auscultation bilaterally.  CVS: S1, S2 no murmurs, no S3.Regular rate.  ABD: Soft non tender.   Ext: No edema, erythema of both legs, no skin breakdown  MS: decreased  ROM spine, shoulders, hips and knees.  Skin: Intact, no ulcerations or rash noted.  Psych: Good eye contact, normal affect. Memory intact not anxious or depressed appearing.  CNS: CN 2-12 intact, power,  normal throughout.no focal deficits noted.   Assessment &  Plan  Encounter for chronic pain management The patient's Controlled Substance registry is reviewed and compliance confirmed. Adequacy of  Pain control and level of function is assessed. Medication dosing is adjusted as deemed appropriate. Twelve weeks of medication is prescribed , with a follow up appointment between 11 to 12 weeks .   CKD (chronic kidney disease) stage 4, GFR 15-29 ml/min (HCC) Updated lab needed at/ before next visit.   At high risk for falls Home safety reviewed briefly, no falls since last visit  Hypothyroidism constipation Updated lab needed at/ before next visit.   Hyperlipidemia LDL goal <100 Hyperlipidemia:Low fat diet discussed and encouraged.   Lipid Panel  Lab Results  Component Value Date   CHOL 210 (H) 08/09/2023   HDL 72 08/09/2023   LDLCALC 119 (H) 08/09/2023   TRIG 108 08/09/2023   CHOLHDL 2.9 08/09/2023     Updated lab needed at/ before next visit.   Parent refuses immunizations Unchanged, offered vaccines , refuses  GERD with stricture Denies dysphagia and controlled on current medication  Essential hypertension Controlled, no change in medication

## 2024-06-26 NOTE — Assessment & Plan Note (Signed)
 The patient's Controlled Substance registry is reviewed and compliance confirmed. Adequacy of  Pain control and level of function is assessed. Medication dosing is adjusted as deemed appropriate. Twelve weeks of medication is prescribed , with a follow up appointment between 11 to 12 weeks .

## 2024-07-29 NOTE — Progress Notes (Signed)
 Pharmacy Quality Measure Review  This patient is appearing on a report for being at risk of failing the adherence measure for hypertension (ACEi/ARB) medications this calendar year.   Medication: Lisinopril  2.5 mg Last fill date: 07/17/24 for 90 day supply, sold 07/17/24  Insurance report was not up to date. No action needed at this time.   Jenkins Graces, PharmD PGY1 Pharmacy Resident

## 2024-08-19 ENCOUNTER — Ambulatory Visit: Payer: Self-pay

## 2024-08-19 NOTE — Telephone Encounter (Signed)
Noted patient scheduled

## 2024-08-19 NOTE — Telephone Encounter (Signed)
 FYI Only or Action Required?: Action required by provider: booked outside dispo due to availability for 08/22/24 .  Patient was last seen in primary care on 06/26/2024 by Antonetta Rollene BRAVO, MD.  Called Nurse Triage reporting Rash.  Symptoms began 2 weeks.  Interventions attempted: OTC medications: benydral  topical .  Symptoms are: gradually worsening.  Triage Disposition: See HCP Within 4 Hours (Or PCP Triage)  Patient/caregiver understands and will follow disposition?: No, wishes to speak with PCP             Reason for Disposition  Large or small blisters on skin (i.e., fluid filled bubbles or sacs)  Answer Assessment - Initial Assessment Questions Patient reports symptoms started out ulcer on feet that taken care of , but right now past 2 weeks body is breaking out with horrible rash that goes ito little sores everywhere hands arms back shoulder stomach chest . Very painful. 6/10 and  Very itchy severe. Using beyndral topical for this. Patient requesting only PCP office , does not want to go to UC or another office, no able to find appointment until Friday 1/16 and not with PCP patient request this be scheduled .sending to office for review booked outside dispo   Patient agreeable with UC if new /worsening symptoms including but not limited to development of fever, chills or nausea/vomiting     1. APPEARANCE of RASH: What does the rash look like? (e.g., blisters, dry flaky skin, red spots, redness, sores)     All over red rash blistering  2. SIZE: How big are the spots? (e.g., tip of pen, eraser, coin; inches, centimeters)     Small blisters but they aren't draining  3. LOCATION: Where is the rash located?     All over  4. COLOR: What color is the rash? (Note: It is difficult to assess rash color in people with darker-colored skin. When this situation occurs, simply ask the caller to describe what they see.)     Red rash with blisters not weeping 5. ONSET:  When did the rash begin?     2 weeks ago  6. FEVER: Do you have a fever? If Yes, ask: What is your temperature, how was it measured, and when did it start?     Denies  7. ITCHING: Does the rash itch? If Yes, ask: How bad is the itch? (Scale 1-10; or mild, moderate, severe)     Severe  8. CAUSE: What do you think is causing the rash?     Unknown  9. MEDICINE FACTORS: Have you started any new medicines within the last 2 weeks? (e.g., antibiotics)      Denies   10. OTHER SYMPTOMS: Do you have any other symptoms? (e.g., dizziness, headache, sore throat, joint pain)       Patient denies chest pain, difficulty breathing beyond baseline, fever ,chills , headache, chills  Protocols used: Rash or Redness - Widespread-A-AH Copied from CRM #[id number]. Topic: Clinical - Red Word Triage >> Aug 19, 2024  3:43 PM Antony RAMAN wrote: Red Word that prompted transfer to Nurse Triage: itchy rashes all over body

## 2024-08-22 ENCOUNTER — Ambulatory Visit (INDEPENDENT_AMBULATORY_CARE_PROVIDER_SITE_OTHER): Payer: Self-pay

## 2024-08-22 VITALS — BP 140/62 | HR 106 | Resp 16 | Ht 61.0 in | Wt 151.0 lb

## 2024-08-22 DIAGNOSIS — N760 Acute vaginitis: Secondary | ICD-10-CM

## 2024-08-22 DIAGNOSIS — L309 Dermatitis, unspecified: Secondary | ICD-10-CM

## 2024-08-22 MED ORDER — METHYLPREDNISOLONE 4 MG PO TBPK: ORAL_TABLET | ORAL | 0 refills | Status: AC

## 2024-08-22 MED ORDER — AMMONIUM LACTATE 12 % EX LOTN: 1.0000 | TOPICAL_LOTION | Freq: Every day | CUTANEOUS | 2 refills | Status: AC | PRN

## 2024-08-22 MED ORDER — CLOTRIMAZOLE-BETAMETHASONE 1-0.05 % EX CREA: 1.0000 | TOPICAL_CREAM | Freq: Two times a day (BID) | CUTANEOUS | 2 refills | Status: AC

## 2024-08-22 NOTE — Progress Notes (Unsigned)
 "  Established Patient Office Visit  Subjective   Patient ID: Katelyn Rowland, female    DOB: 10-Dec-1940  Age: 84 y.o. MRN: [id number]  Chief Complaint  Patient presents with   Rash    Pt complains of rash all over body x2 weeks. Worse on legs and arms. Pt states the itching has been keeping her up at night. No fever. Has been trying benadryl , no improvement     HPI Discussed the use of AI scribe software for clinical note transcription with the patient, who gave verbal consent to proceed.  History of Present Illness    Katelyn Rowland is an 84 year old female who presents with a generalized rash and itching.  Generalized pruritus and rash - Generalized rash and severe pruritus ongoing for several weeks - Pruritus primarily affects vulva, legs, abdomen, and chest - Itching is intolerable and significantly disrupts sleep - Triamcinolone cream provides some relief, but use is limited due to concern about potency  Vulvar changes - Vulvar tissue appears white, whereas it is usually pink - Suspects possible yeast infection  Lower extremity erythema and burning - History of leg ulcers - Persistent warmth and redness in legs, not resolving with overnight rest - Burning sensation in legs described as 'burning like fire' - Attempts symptom relief with a fan and Voltaren gel - Difficulty using compression socks due to hand weakness  Functional status and social support - Lives alone and is homebound - Relies on pastor for transportation to medical appointments  Thermoregulatory changes - Cold-natured for the past three years, a change from previous baseline  Medication allergies - Allergic to prednisone  injections but tolerates oral prednisone   Physical limitations - Multiple prior surgeries resulting in some physical limitations     Patient Active Problem List   Diagnosis Date Noted   Dermatitis 08/28/2024   Acute vaginitis 08/28/2024   Osteoporosis 03/09/2024   Right knee  pain 11/07/2023   Osteoarthritis of both hips 10/02/2023   Osteoarthritis of right knee 10/02/2023   Inability to walk 10/02/2023   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   Loneliness 06/04/2023   Varicose veins of leg with swelling, bilateral 02/19/2023   Encounter for Medicare annual examination with abnormal findings 11/11/2022   Mass of left submandibular region 11/11/2022   Mass of left side of neck 11/11/2022   Left leg swelling 10/06/2021   Parent refuses immunizations 08/27/2021   Pain management contract discussed 09/06/2020   Refused H1N1 influenza virus immunization 04/05/2019   Acquired bilateral finger deformity 10/03/2018   Generalized joint pain 10/02/2018   Chronic left shoulder pain 01/07/2018   Other spondylosis with radiculopathy, lumbar region 10/22/2017   Lumbar disc disease with radiculopathy 10/22/2017   Carotid bruit present 12/12/2015   Gait instability 10/16/2014   At high risk for falls 10/16/2014   CKD (chronic kidney disease) stage 4, GFR 15-29 ml/min (HCC) 10/16/2014   Anemia in chronic kidney disease 10/16/2014   GERD with stricture 06/11/2014   Encounter for chronic pain management 02/14/2014   Vitamin D  deficiency 02/12/2014   Lipoma of forearm 01/11/2014   Right thyroid  nodule 01/11/2014   Cervical neck pain with evidence of disc disease 01/08/2014   Poor urinary stream 05/26/2013   Prediabetes 01/25/2013   Peripheral vascular disease 08/11/2012   Back pain 04/29/2012   Proteinuria 04/29/2012   DNR (do not resuscitate) 05/29/2011   Overweight (BMI 25.0-29.9) 04/11/2009   Hypothyroidism 03/29/2008   INSOMNIA, CHRONIC 03/29/2008  Hyperlipidemia LDL goal <100 10/15/2007   Essential hypertension 10/15/2007    ROS    Objective:     BP (!) 140/62   Pulse (!) 106   Resp 16   Ht 5' 1 (1.549 m)   Wt 151 lb 0.6 oz (68.5 kg)   SpO2 100%   BMI 28.54 kg/m  BP Readings from Last 3 Encounters:  08/22/24 (!) 140/62   06/26/24 138/72  04/15/24 (!) 174/86   Wt Readings from Last 3 Encounters:  08/22/24 151 lb 0.6 oz (68.5 kg)  06/26/24 148 lb 1.3 oz (67.2 kg)  03/26/24 152 lb (68.9 kg)     Physical Exam Vitals and nursing note reviewed.  Constitutional:      Appearance: Normal appearance.  HENT:     Head: Normocephalic.  Eyes:     Extraocular Movements: Extraocular movements intact.     Pupils: Pupils are equal, round, and reactive to light.  Cardiovascular:     Rate and Rhythm: Normal rate and regular rhythm.  Pulmonary:     Effort: Pulmonary effort is normal.     Breath sounds: Normal breath sounds.  Genitourinary:    Exam position: Supine.     Comments: Skin surrounding vulva is pale Musculoskeletal:     Cervical back: Normal range of motion and neck supple.  Skin:    Findings: Rash (scatterred pale macules on legs and arms) present.  Neurological:     Mental Status: She is alert and oriented to person, place, and time.  Psychiatric:        Mood and Affect: Mood normal.        Thought Content: Thought content normal.     No results found for any visits on 08/22/24.    The ASCVD Risk score (Arnett DK, et al., 2019) failed to calculate for the following reasons:   The 2019 ASCVD risk score is only valid for ages 14 to 48   * - Cholesterol units were assumed    Assessment & Plan:   Problem List Items Addressed This Visit       Musculoskeletal and Integument   Dermatitis   Chronic dermatitis with widespread itching and rash. Allergic to prednisone  injections but tolerates oral prednisone  and topical cortisone. - Prescribed Medrol  dose pack. - Advised mild compression socks for leg swelling. - Recommended continued use of triamcinolone for rash.      Relevant Medications   methylPREDNISolone  (MEDROL  DOSEPAK) 4 MG TBPK tablet   ammonium lactate  (LAC-HYDRIN ) 12 % lotion     Genitourinary   Acute vaginitis - Primary   Suspected yeast infection with white vulvar  tissue. - Prescribed combination low-dose steroid and antifungal cream for vulvar application.      Relevant Medications   clotrimazole -betamethasone  (LOTRISONE ) cream    No follow-ups on file.    Leita Longs, FNP  "

## 2024-08-27 ENCOUNTER — Other Ambulatory Visit: Payer: Self-pay | Admitting: Family Medicine

## 2024-08-27 DIAGNOSIS — N184 Chronic kidney disease, stage 4 (severe): Secondary | ICD-10-CM

## 2024-08-28 DIAGNOSIS — L309 Dermatitis, unspecified: Secondary | ICD-10-CM | POA: Insufficient documentation

## 2024-08-28 DIAGNOSIS — N760 Acute vaginitis: Secondary | ICD-10-CM | POA: Insufficient documentation

## 2024-08-28 NOTE — Assessment & Plan Note (Signed)
 Suspected yeast infection with white vulvar tissue. - Prescribed combination low-dose steroid and antifungal cream for vulvar application.

## 2024-08-28 NOTE — Assessment & Plan Note (Signed)
 Chronic dermatitis with widespread itching and rash. Allergic to prednisone  injections but tolerates oral prednisone  and topical cortisone. - Prescribed Medrol  dose pack. - Advised mild compression socks for leg swelling. - Recommended continued use of triamcinolone for rash.

## 2024-09-05 ENCOUNTER — Other Ambulatory Visit: Payer: Self-pay | Admitting: Family Medicine

## 2024-10-24 ENCOUNTER — Ambulatory Visit: Admitting: Family Medicine

## 2025-03-31 ENCOUNTER — Ambulatory Visit
